# Patient Record
Sex: Female | Born: 1935
Health system: Southern US, Community
[De-identification: ages and names within clinical notes are randomized; demographics above are authoritative.]

## PROBLEM LIST (undated history)

## (undated) DIAGNOSIS — Z800 Family history of malignant neoplasm of digestive organs: Secondary | ICD-10-CM

## (undated) DIAGNOSIS — Z9889 Other specified postprocedural states: Secondary | ICD-10-CM

## (undated) DIAGNOSIS — R053 Chronic cough: Secondary | ICD-10-CM

## (undated) DIAGNOSIS — K222 Esophageal obstruction: Secondary | ICD-10-CM

## (undated) DIAGNOSIS — F32A Depression, unspecified: Secondary | ICD-10-CM

## (undated) DIAGNOSIS — R202 Paresthesia of skin: Secondary | ICD-10-CM

## (undated) DIAGNOSIS — R112 Nausea with vomiting, unspecified: Secondary | ICD-10-CM

## (undated) DIAGNOSIS — N390 Urinary tract infection, site not specified: Secondary | ICD-10-CM

## (undated) DIAGNOSIS — Z923 Personal history of irradiation: Secondary | ICD-10-CM

## (undated) DIAGNOSIS — Z8719 Personal history of other diseases of the digestive system: Secondary | ICD-10-CM

## (undated) DIAGNOSIS — H9190 Unspecified hearing loss, unspecified ear: Secondary | ICD-10-CM

## (undated) DIAGNOSIS — Z86010 Personal history of colonic polyps: Secondary | ICD-10-CM

## (undated) DIAGNOSIS — K219 Gastro-esophageal reflux disease without esophagitis: Secondary | ICD-10-CM

## (undated) DIAGNOSIS — R05 Cough: Secondary | ICD-10-CM

## (undated) DIAGNOSIS — M75100 Unspecified rotator cuff tear or rupture of unspecified shoulder, not specified as traumatic: Secondary | ICD-10-CM

## (undated) DIAGNOSIS — M8580 Other specified disorders of bone density and structure, unspecified site: Secondary | ICD-10-CM

## (undated) DIAGNOSIS — I499 Cardiac arrhythmia, unspecified: Secondary | ICD-10-CM

## (undated) DIAGNOSIS — R200 Anesthesia of skin: Secondary | ICD-10-CM

## (undated) DIAGNOSIS — K649 Unspecified hemorrhoids: Secondary | ICD-10-CM

## (undated) DIAGNOSIS — K429 Umbilical hernia without obstruction or gangrene: Secondary | ICD-10-CM

## (undated) DIAGNOSIS — F329 Major depressive disorder, single episode, unspecified: Secondary | ICD-10-CM

## (undated) DIAGNOSIS — E7800 Pure hypercholesterolemia, unspecified: Secondary | ICD-10-CM

## (undated) DIAGNOSIS — F419 Anxiety disorder, unspecified: Secondary | ICD-10-CM

## (undated) DIAGNOSIS — C50919 Malignant neoplasm of unspecified site of unspecified female breast: Secondary | ICD-10-CM

## (undated) DIAGNOSIS — M1990 Unspecified osteoarthritis, unspecified site: Secondary | ICD-10-CM

## (undated) DIAGNOSIS — I10 Essential (primary) hypertension: Secondary | ICD-10-CM

## (undated) HISTORY — DX: Gastro-esophageal reflux disease without esophagitis: K21.9

## (undated) HISTORY — PX: CATARACT EXTRACTION W/ INTRAOCULAR LENS IMPLANT: SHX1309

## (undated) HISTORY — DX: Umbilical hernia without obstruction or gangrene: K42.9

## (undated) HISTORY — PX: UMBILICAL HERNIA REPAIR: SHX196

## (undated) HISTORY — DX: Personal history of colonic polyps: Z86.010

## (undated) HISTORY — PX: DILATION AND CURETTAGE OF UTERUS: SHX78

## (undated) HISTORY — DX: Other specified postprocedural states: Z98.890

## (undated) HISTORY — DX: Depression, unspecified: F32.A

## (undated) HISTORY — DX: Urinary tract infection, site not specified: N39.0

## (undated) HISTORY — DX: Pure hypercholesterolemia, unspecified: E78.00

## (undated) HISTORY — PX: ESOPHAGOGASTRODUODENOSCOPY ENDOSCOPY: SHX5814

## (undated) HISTORY — DX: Major depressive disorder, single episode, unspecified: F32.9

## (undated) HISTORY — DX: Malignant neoplasm of unspecified site of unspecified female breast: C50.919

## (undated) HISTORY — DX: Personal history of other diseases of the digestive system: Z87.19

## (undated) HISTORY — PX: COLONOSCOPY: SHX174

## (undated) HISTORY — DX: Family history of malignant neoplasm of digestive organs: Z80.0

## (undated) HISTORY — DX: Anxiety disorder, unspecified: F41.9

## (undated) HISTORY — DX: Esophageal obstruction: K22.2

---

## 1998-03-14 ENCOUNTER — Emergency Department (HOSPITAL_COMMUNITY): Admission: EM | Admit: 1998-03-14 | Discharge: 1998-03-14 | Payer: Self-pay | Admitting: Emergency Medicine

## 1998-04-05 ENCOUNTER — Ambulatory Visit (HOSPITAL_COMMUNITY): Admission: RE | Admit: 1998-04-05 | Discharge: 1998-04-05 | Payer: Self-pay | Admitting: Gastroenterology

## 1998-10-01 ENCOUNTER — Other Ambulatory Visit: Admission: RE | Admit: 1998-10-01 | Discharge: 1998-10-01 | Payer: Self-pay | Admitting: Obstetrics & Gynecology

## 1998-11-19 ENCOUNTER — Encounter: Payer: Self-pay | Admitting: Gastroenterology

## 1998-11-19 ENCOUNTER — Ambulatory Visit (HOSPITAL_COMMUNITY): Admission: RE | Admit: 1998-11-19 | Discharge: 1998-11-19 | Payer: Self-pay | Admitting: Gastroenterology

## 1998-11-19 DIAGNOSIS — K222 Esophageal obstruction: Secondary | ICD-10-CM | POA: Insufficient documentation

## 1999-10-14 ENCOUNTER — Encounter: Admission: RE | Admit: 1999-10-14 | Discharge: 1999-10-14 | Payer: Self-pay | Admitting: Family Medicine

## 1999-10-14 ENCOUNTER — Encounter: Payer: Self-pay | Admitting: Family Medicine

## 2000-10-15 ENCOUNTER — Encounter: Admission: RE | Admit: 2000-10-15 | Discharge: 2000-10-15 | Payer: Self-pay | Admitting: Obstetrics & Gynecology

## 2000-10-15 ENCOUNTER — Encounter: Payer: Self-pay | Admitting: Obstetrics & Gynecology

## 2000-11-10 ENCOUNTER — Other Ambulatory Visit: Admission: RE | Admit: 2000-11-10 | Discharge: 2000-11-10 | Payer: Self-pay | Admitting: Obstetrics & Gynecology

## 2001-10-18 ENCOUNTER — Encounter: Admission: RE | Admit: 2001-10-18 | Discharge: 2001-10-18 | Payer: Self-pay | Admitting: Family Medicine

## 2001-10-18 ENCOUNTER — Encounter: Payer: Self-pay | Admitting: Obstetrics & Gynecology

## 2002-01-17 ENCOUNTER — Other Ambulatory Visit: Admission: RE | Admit: 2002-01-17 | Discharge: 2002-01-17 | Payer: Self-pay | Admitting: Obstetrics & Gynecology

## 2002-10-19 ENCOUNTER — Encounter: Payer: Self-pay | Admitting: Family Medicine

## 2002-10-19 ENCOUNTER — Encounter: Admission: RE | Admit: 2002-10-19 | Discharge: 2002-10-19 | Payer: Self-pay | Admitting: Family Medicine

## 2003-09-12 ENCOUNTER — Encounter: Admission: RE | Admit: 2003-09-12 | Discharge: 2003-09-12 | Payer: Self-pay | Admitting: Family Medicine

## 2003-10-04 ENCOUNTER — Encounter: Admission: RE | Admit: 2003-10-04 | Discharge: 2003-10-04 | Payer: Self-pay | Admitting: Obstetrics & Gynecology

## 2004-02-19 ENCOUNTER — Other Ambulatory Visit: Admission: RE | Admit: 2004-02-19 | Discharge: 2004-02-19 | Payer: Self-pay | Admitting: Obstetrics & Gynecology

## 2004-10-09 ENCOUNTER — Ambulatory Visit (HOSPITAL_COMMUNITY): Admission: RE | Admit: 2004-10-09 | Discharge: 2004-10-09 | Payer: Self-pay | Admitting: Family Medicine

## 2004-10-17 ENCOUNTER — Encounter (INDEPENDENT_AMBULATORY_CARE_PROVIDER_SITE_OTHER): Payer: Self-pay | Admitting: Diagnostic Radiology

## 2004-10-17 ENCOUNTER — Encounter: Admission: RE | Admit: 2004-10-17 | Discharge: 2004-10-17 | Payer: Self-pay | Admitting: Family Medicine

## 2004-10-17 ENCOUNTER — Encounter (INDEPENDENT_AMBULATORY_CARE_PROVIDER_SITE_OTHER): Payer: Self-pay | Admitting: *Deleted

## 2004-10-31 ENCOUNTER — Encounter: Admission: RE | Admit: 2004-10-31 | Discharge: 2004-10-31 | Payer: Self-pay | Admitting: General Surgery

## 2004-11-02 DIAGNOSIS — C50919 Malignant neoplasm of unspecified site of unspecified female breast: Secondary | ICD-10-CM

## 2004-11-02 HISTORY — DX: Malignant neoplasm of unspecified site of unspecified female breast: C50.919

## 2004-11-02 HISTORY — PX: BREAST LUMPECTOMY: SHX2

## 2004-11-06 ENCOUNTER — Encounter: Admission: RE | Admit: 2004-11-06 | Discharge: 2004-11-06 | Payer: Self-pay | Admitting: General Surgery

## 2004-11-10 ENCOUNTER — Encounter (INDEPENDENT_AMBULATORY_CARE_PROVIDER_SITE_OTHER): Payer: Self-pay | Admitting: Specialist

## 2004-11-10 ENCOUNTER — Encounter: Admission: RE | Admit: 2004-11-10 | Discharge: 2004-11-10 | Payer: Self-pay | Admitting: General Surgery

## 2004-11-10 ENCOUNTER — Ambulatory Visit (HOSPITAL_COMMUNITY): Admission: RE | Admit: 2004-11-10 | Discharge: 2004-11-10 | Payer: Self-pay | Admitting: General Surgery

## 2004-11-10 ENCOUNTER — Ambulatory Visit (HOSPITAL_BASED_OUTPATIENT_CLINIC_OR_DEPARTMENT_OTHER): Admission: RE | Admit: 2004-11-10 | Discharge: 2004-11-10 | Payer: Self-pay | Admitting: General Surgery

## 2004-11-14 ENCOUNTER — Ambulatory Visit: Payer: Self-pay | Admitting: Oncology

## 2004-11-24 ENCOUNTER — Ambulatory Visit: Admission: RE | Admit: 2004-11-24 | Discharge: 2005-01-28 | Payer: Self-pay | Admitting: *Deleted

## 2005-01-21 ENCOUNTER — Ambulatory Visit: Payer: Self-pay | Admitting: Oncology

## 2005-02-17 ENCOUNTER — Ambulatory Visit: Admission: RE | Admit: 2005-02-17 | Discharge: 2005-02-17 | Payer: Self-pay | Admitting: *Deleted

## 2005-03-05 ENCOUNTER — Ambulatory Visit: Payer: Self-pay | Admitting: Oncology

## 2005-05-01 ENCOUNTER — Ambulatory Visit: Payer: Self-pay | Admitting: Oncology

## 2005-08-24 ENCOUNTER — Ambulatory Visit: Payer: Self-pay | Admitting: Gastroenterology

## 2005-09-02 ENCOUNTER — Ambulatory Visit: Payer: Self-pay | Admitting: Oncology

## 2005-10-12 ENCOUNTER — Encounter: Admission: RE | Admit: 2005-10-12 | Discharge: 2005-10-12 | Payer: Self-pay | Admitting: Family Medicine

## 2006-03-16 ENCOUNTER — Ambulatory Visit: Payer: Self-pay | Admitting: Oncology

## 2006-03-18 LAB — COMPREHENSIVE METABOLIC PANEL
ALT: 15 U/L (ref 0–40)
AST: 24 U/L (ref 0–37)
Albumin: 4.1 g/dL (ref 3.5–5.2)
Alkaline Phosphatase: 79 U/L (ref 39–117)
BUN: 10 mg/dL (ref 6–23)
CO2: 27 mEq/L (ref 19–32)
Calcium: 9.1 mg/dL (ref 8.4–10.5)
Chloride: 104 mEq/L (ref 96–112)
Creatinine, Ser: 1 mg/dL (ref 0.4–1.2)
Glucose, Bld: 80 mg/dL (ref 70–99)
Potassium: 4 mEq/L (ref 3.5–5.3)
Sodium: 140 mEq/L (ref 135–145)
Total Bilirubin: 0.6 mg/dL (ref 0.3–1.2)
Total Protein: 6.3 g/dL (ref 6.0–8.3)

## 2006-03-18 LAB — CBC WITH DIFFERENTIAL (CANCER CENTER ONLY)
BASO#: 0 10*3/uL (ref 0.0–0.2)
BASO%: 0.6 % (ref 0.0–2.0)
EOS%: 2.3 % (ref 0.0–7.0)
Eosinophils Absolute: 0.1 10*3/uL (ref 0.0–0.5)
HCT: 43.4 % (ref 34.8–46.6)
HGB: 14.9 g/dL (ref 11.6–15.9)
LYMPH#: 1.3 10*3/uL (ref 0.9–3.3)
LYMPH%: 24.7 % (ref 14.0–48.0)
MCH: 30.6 pg (ref 26.0–34.0)
MCHC: 34.3 g/dL (ref 32.0–36.0)
MCV: 89 fL (ref 81–101)
MONO#: 0.5 10*3/uL (ref 0.1–0.9)
MONO%: 8.7 % (ref 0.0–13.0)
NEUT#: 3.4 10*3/uL (ref 1.5–6.5)
NEUT%: 63.7 % (ref 39.6–80.0)
Platelets: 272 10*3/uL (ref 145–400)
RBC: 4.85 10*6/uL (ref 3.70–5.32)
RDW: 12.4 % (ref 10.5–14.6)
WBC: 5.3 10*3/uL (ref 3.9–10.0)

## 2006-03-18 LAB — LACTATE DEHYDROGENASE: LDH: 155 U/L (ref 94–250)

## 2006-03-18 LAB — CANCER ANTIGEN 27.29: CA 27.29: 18 U/mL (ref 0–39)

## 2006-04-13 ENCOUNTER — Ambulatory Visit: Payer: Self-pay | Admitting: Gastroenterology

## 2006-05-17 ENCOUNTER — Ambulatory Visit: Payer: Self-pay | Admitting: Gastroenterology

## 2006-06-16 ENCOUNTER — Encounter: Admission: RE | Admit: 2006-06-16 | Discharge: 2006-06-16 | Payer: Self-pay | Admitting: General Surgery

## 2006-09-13 ENCOUNTER — Encounter: Admission: RE | Admit: 2006-09-13 | Discharge: 2006-09-13 | Payer: Self-pay | Admitting: Family Medicine

## 2006-09-16 ENCOUNTER — Ambulatory Visit: Payer: Self-pay | Admitting: Oncology

## 2006-09-20 LAB — CBC WITH DIFFERENTIAL (CANCER CENTER ONLY)
BASO#: 0 10*3/uL (ref 0.0–0.2)
BASO%: 0.4 % (ref 0.0–2.0)
EOS%: 2.5 % (ref 0.0–7.0)
Eosinophils Absolute: 0.2 10*3/uL (ref 0.0–0.5)
HCT: 42 % (ref 34.8–46.6)
HGB: 14.1 g/dL (ref 11.6–15.9)
LYMPH#: 1.3 10*3/uL (ref 0.9–3.3)
LYMPH%: 22.5 % (ref 14.0–48.0)
MCH: 30.1 pg (ref 26.0–34.0)
MCHC: 33.5 g/dL (ref 32.0–36.0)
MCV: 90 fL (ref 81–101)
MONO#: 0.4 10*3/uL (ref 0.1–0.9)
MONO%: 7.5 % (ref 0.0–13.0)
NEUT#: 3.9 10*3/uL (ref 1.5–6.5)
NEUT%: 67.1 % (ref 39.6–80.0)
Platelets: 315 10*3/uL (ref 145–400)
RBC: 4.68 10*6/uL (ref 3.70–5.32)
RDW: 11.6 % (ref 10.5–14.6)
WBC: 5.9 10*3/uL (ref 3.9–10.0)

## 2006-09-21 LAB — COMPREHENSIVE METABOLIC PANEL
ALT: 18 U/L (ref 0–35)
AST: 23 U/L (ref 0–37)
Albumin: 3.8 g/dL (ref 3.5–5.2)
Alkaline Phosphatase: 92 U/L (ref 39–117)
BUN: 11 mg/dL (ref 6–23)
CO2: 29 mEq/L (ref 19–32)
Calcium: 9.4 mg/dL (ref 8.4–10.5)
Chloride: 107 mEq/L (ref 96–112)
Creatinine, Ser: 0.7 mg/dL (ref 0.40–1.20)
Glucose, Bld: 81 mg/dL (ref 70–99)
Potassium: 4.1 mEq/L (ref 3.5–5.3)
Sodium: 143 mEq/L (ref 135–145)
Total Bilirubin: 0.5 mg/dL (ref 0.3–1.2)
Total Protein: 6.4 g/dL (ref 6.0–8.3)

## 2006-09-21 LAB — LACTATE DEHYDROGENASE: LDH: 151 U/L (ref 94–250)

## 2006-09-21 LAB — CANCER ANTIGEN 27.29: CA 27.29: 16 U/mL (ref 0–39)

## 2006-10-13 ENCOUNTER — Encounter: Admission: RE | Admit: 2006-10-13 | Discharge: 2006-10-13 | Payer: Self-pay | Admitting: Family Medicine

## 2007-01-26 ENCOUNTER — Ambulatory Visit: Payer: Self-pay | Admitting: Gastroenterology

## 2007-01-26 LAB — CONVERTED CEMR LAB
BUN: 11 mg/dL (ref 6–23)
Basophils Absolute: 0.1 10*3/uL (ref 0.0–0.1)
Basophils Relative: 0.8 % (ref 0.0–1.0)
Creatinine, Ser: 0.8 mg/dL (ref 0.4–1.2)
Eosinophils Absolute: 0.1 10*3/uL (ref 0.0–0.6)
Eosinophils Relative: 1.6 % (ref 0.0–5.0)
HCT: 42.2 % (ref 36.0–46.0)
Hemoglobin: 14.6 g/dL (ref 12.0–15.0)
Lymphocytes Relative: 23.2 % (ref 12.0–46.0)
MCHC: 34.6 g/dL (ref 30.0–36.0)
MCV: 88.8 fL (ref 78.0–100.0)
Monocytes Absolute: 0.6 10*3/uL (ref 0.2–0.7)
Monocytes Relative: 8.7 % (ref 3.0–11.0)
Neutro Abs: 4.2 10*3/uL (ref 1.4–7.7)
Neutrophils Relative %: 65.7 % (ref 43.0–77.0)
Platelets: 290 10*3/uL (ref 150–400)
RBC: 4.75 M/uL (ref 3.87–5.11)
RDW: 13.3 % (ref 11.5–14.6)
WBC: 6.5 10*3/uL (ref 4.5–10.5)

## 2007-01-28 ENCOUNTER — Ambulatory Visit: Payer: Self-pay | Admitting: Cardiology

## 2007-02-03 ENCOUNTER — Ambulatory Visit: Payer: Self-pay | Admitting: Gastroenterology

## 2007-02-09 ENCOUNTER — Encounter (INDEPENDENT_AMBULATORY_CARE_PROVIDER_SITE_OTHER): Payer: Self-pay | Admitting: Specialist

## 2007-02-09 ENCOUNTER — Ambulatory Visit: Payer: Self-pay | Admitting: Gastroenterology

## 2007-02-09 DIAGNOSIS — K5732 Diverticulitis of large intestine without perforation or abscess without bleeding: Secondary | ICD-10-CM | POA: Insufficient documentation

## 2007-02-09 DIAGNOSIS — D126 Benign neoplasm of colon, unspecified: Secondary | ICD-10-CM | POA: Insufficient documentation

## 2007-02-09 DIAGNOSIS — Z860100 Personal history of colon polyps, unspecified: Secondary | ICD-10-CM

## 2007-02-09 HISTORY — DX: Personal history of colonic polyps: Z86.010

## 2007-02-09 HISTORY — DX: Personal history of colon polyps, unspecified: Z86.0100

## 2007-03-21 ENCOUNTER — Ambulatory Visit: Payer: Self-pay | Admitting: Oncology

## 2007-03-22 LAB — CBC WITH DIFFERENTIAL (CANCER CENTER ONLY)
BASO#: 0 10*3/uL (ref 0.0–0.2)
BASO%: 0.5 % (ref 0.0–2.0)
EOS%: 2.3 % (ref 0.0–7.0)
Eosinophils Absolute: 0.1 10*3/uL (ref 0.0–0.5)
HCT: 41.5 % (ref 34.8–46.6)
HGB: 14.2 g/dL (ref 11.6–15.9)
LYMPH#: 1.3 10*3/uL (ref 0.9–3.3)
LYMPH%: 24.3 % (ref 14.0–48.0)
MCH: 30.3 pg (ref 26.0–34.0)
MCHC: 34.2 g/dL (ref 32.0–36.0)
MCV: 89 fL (ref 81–101)
MONO#: 0.4 10*3/uL (ref 0.1–0.9)
MONO%: 7.8 % (ref 0.0–13.0)
NEUT#: 3.6 10*3/uL (ref 1.5–6.5)
NEUT%: 65.1 % (ref 39.6–80.0)
Platelets: 296 10*3/uL (ref 145–400)
RBC: 4.68 10*6/uL (ref 3.70–5.32)
RDW: 12.1 % (ref 10.5–14.6)
WBC: 5.5 10*3/uL (ref 3.9–10.0)

## 2007-03-23 LAB — COMPREHENSIVE METABOLIC PANEL
ALT: 16 U/L (ref 0–35)
AST: 22 U/L (ref 0–37)
Albumin: 3.8 g/dL (ref 3.5–5.2)
Alkaline Phosphatase: 85 U/L (ref 39–117)
BUN: 12 mg/dL (ref 6–23)
CO2: 28 mEq/L (ref 19–32)
Calcium: 9.4 mg/dL (ref 8.4–10.5)
Chloride: 106 mEq/L (ref 96–112)
Creatinine, Ser: 0.89 mg/dL (ref 0.40–1.20)
Glucose, Bld: 84 mg/dL (ref 70–99)
Potassium: 4.4 mEq/L (ref 3.5–5.3)
Sodium: 142 mEq/L (ref 135–145)
Total Bilirubin: 0.4 mg/dL (ref 0.3–1.2)
Total Protein: 6.2 g/dL (ref 6.0–8.3)

## 2007-03-23 LAB — CANCER ANTIGEN 27.29: CA 27.29: 22 U/mL (ref 0–39)

## 2007-05-11 ENCOUNTER — Ambulatory Visit: Payer: Self-pay | Admitting: Gastroenterology

## 2007-09-19 ENCOUNTER — Ambulatory Visit: Payer: Self-pay | Admitting: Oncology

## 2007-09-20 LAB — COMPREHENSIVE METABOLIC PANEL
ALT: 16 U/L (ref 0–35)
AST: 22 U/L (ref 0–37)
Albumin: 4.1 g/dL (ref 3.5–5.2)
Alkaline Phosphatase: 94 U/L (ref 39–117)
BUN: 10 mg/dL (ref 6–23)
CO2: 28 mEq/L (ref 19–32)
Calcium: 9.3 mg/dL (ref 8.4–10.5)
Chloride: 104 mEq/L (ref 96–112)
Creatinine, Ser: 0.92 mg/dL (ref 0.40–1.20)
Glucose, Bld: 87 mg/dL (ref 70–99)
Potassium: 4.1 mEq/L (ref 3.5–5.3)
Sodium: 140 mEq/L (ref 135–145)
Total Bilirubin: 0.7 mg/dL (ref 0.3–1.2)
Total Protein: 6.3 g/dL (ref 6.0–8.3)

## 2007-09-20 LAB — CBC WITH DIFFERENTIAL (CANCER CENTER ONLY)
BASO#: 0 10*3/uL (ref 0.0–0.2)
BASO%: 0.3 % (ref 0.0–2.0)
EOS%: 2.5 % (ref 0.0–7.0)
Eosinophils Absolute: 0.1 10*3/uL (ref 0.0–0.5)
HCT: 42.1 % (ref 34.8–46.6)
HGB: 14.5 g/dL (ref 11.6–15.9)
LYMPH#: 1.3 10*3/uL (ref 0.9–3.3)
LYMPH%: 25.5 % (ref 14.0–48.0)
MCH: 30.3 pg (ref 26.0–34.0)
MCHC: 34.4 g/dL (ref 32.0–36.0)
MCV: 88 fL (ref 81–101)
MONO#: 0.4 10*3/uL (ref 0.1–0.9)
MONO%: 7.8 % (ref 0.0–13.0)
NEUT#: 3.4 10*3/uL (ref 1.5–6.5)
NEUT%: 63.9 % (ref 39.6–80.0)
Platelets: 282 10*3/uL (ref 145–400)
RBC: 4.78 10*6/uL (ref 3.70–5.32)
RDW: 12.6 % (ref 10.5–14.6)
WBC: 5.3 10*3/uL (ref 3.9–10.0)

## 2007-09-20 LAB — CANCER ANTIGEN 27.29: CA 27.29: 21 U/mL (ref 0–39)

## 2007-10-17 ENCOUNTER — Encounter: Admission: RE | Admit: 2007-10-17 | Discharge: 2007-10-17 | Payer: Self-pay | Admitting: Family Medicine

## 2007-11-10 ENCOUNTER — Encounter: Admission: RE | Admit: 2007-11-10 | Discharge: 2007-11-10 | Payer: Self-pay | Admitting: General Surgery

## 2007-11-14 ENCOUNTER — Ambulatory Visit (HOSPITAL_BASED_OUTPATIENT_CLINIC_OR_DEPARTMENT_OTHER): Admission: RE | Admit: 2007-11-14 | Discharge: 2007-11-14 | Payer: Self-pay | Admitting: General Surgery

## 2008-02-18 DIAGNOSIS — K219 Gastro-esophageal reflux disease without esophagitis: Secondary | ICD-10-CM | POA: Insufficient documentation

## 2008-02-18 DIAGNOSIS — E785 Hyperlipidemia, unspecified: Secondary | ICD-10-CM | POA: Insufficient documentation

## 2008-02-18 DIAGNOSIS — K449 Diaphragmatic hernia without obstruction or gangrene: Secondary | ICD-10-CM | POA: Insufficient documentation

## 2008-02-18 DIAGNOSIS — M1990 Unspecified osteoarthritis, unspecified site: Secondary | ICD-10-CM | POA: Insufficient documentation

## 2008-02-22 ENCOUNTER — Encounter: Payer: Self-pay | Admitting: Gastroenterology

## 2008-03-15 ENCOUNTER — Ambulatory Visit: Payer: Self-pay | Admitting: Oncology

## 2008-09-18 ENCOUNTER — Encounter: Admission: RE | Admit: 2008-09-18 | Discharge: 2008-09-18 | Payer: Self-pay | Admitting: Family Medicine

## 2008-10-17 ENCOUNTER — Encounter: Admission: RE | Admit: 2008-10-17 | Discharge: 2008-10-17 | Payer: Self-pay | Admitting: Family Medicine

## 2008-10-29 ENCOUNTER — Ambulatory Visit: Payer: Self-pay | Admitting: Oncology

## 2008-10-30 LAB — CBC WITH DIFFERENTIAL (CANCER CENTER ONLY)
BASO#: 0 10*3/uL (ref 0.0–0.2)
BASO%: 0.7 % (ref 0.0–2.0)
EOS%: 2 % (ref 0.0–7.0)
Eosinophils Absolute: 0.1 10*3/uL (ref 0.0–0.5)
HCT: 42.5 % (ref 34.8–46.6)
HGB: 14.2 g/dL (ref 11.6–15.9)
LYMPH#: 1.2 10*3/uL (ref 0.9–3.3)
LYMPH%: 21.3 % (ref 14.0–48.0)
MCH: 30.1 pg (ref 26.0–34.0)
MCHC: 33.5 g/dL (ref 32.0–36.0)
MCV: 90 fL (ref 81–101)
MONO#: 0.4 10*3/uL (ref 0.1–0.9)
MONO%: 7.8 % (ref 0.0–13.0)
NEUT#: 3.8 10*3/uL (ref 1.5–6.5)
NEUT%: 68.2 % (ref 39.6–80.0)
Platelets: 245 10*3/uL (ref 145–400)
RBC: 4.73 10*6/uL (ref 3.70–5.32)
RDW: 12 % (ref 10.5–14.6)
WBC: 5.6 10*3/uL (ref 3.9–10.0)

## 2008-10-30 LAB — CMP (CANCER CENTER ONLY)
ALT(SGPT): 17 U/L (ref 10–47)
AST: 29 U/L (ref 11–38)
Albumin: 3.8 g/dL (ref 3.3–5.5)
Alkaline Phosphatase: 86 U/L — ABNORMAL HIGH (ref 26–84)
BUN, Bld: 14 mg/dL (ref 7–22)
CO2: 31 mEq/L (ref 18–33)
Calcium: 9.2 mg/dL (ref 8.0–10.3)
Chloride: 103 mEq/L (ref 98–108)
Creat: 1.2 mg/dl (ref 0.6–1.2)
Glucose, Bld: 87 mg/dL (ref 73–118)
Potassium: 4.2 mEq/L (ref 3.3–4.7)
Sodium: 139 mEq/L (ref 128–145)
Total Bilirubin: 0.4 mg/dl (ref 0.20–1.60)
Total Protein: 7.2 g/dL (ref 6.4–8.1)

## 2008-10-30 LAB — CANCER ANTIGEN 27.29: CA 27.29: 15 U/mL (ref 0–39)

## 2009-05-27 ENCOUNTER — Ambulatory Visit: Payer: Self-pay | Admitting: Oncology

## 2009-05-29 LAB — CBC WITH DIFFERENTIAL (CANCER CENTER ONLY)
BASO#: 0 10*3/uL (ref 0.0–0.2)
BASO%: 0.6 % (ref 0.0–2.0)
EOS%: 2 % (ref 0.0–7.0)
Eosinophils Absolute: 0.1 10*3/uL (ref 0.0–0.5)
HCT: 40 % (ref 34.8–46.6)
HGB: 13.8 g/dL (ref 11.6–15.9)
LYMPH#: 1.3 10*3/uL (ref 0.9–3.3)
LYMPH%: 23.4 % (ref 14.0–48.0)
MCH: 30.3 pg (ref 26.0–34.0)
MCHC: 34.6 g/dL (ref 32.0–36.0)
MCV: 87 fL (ref 81–101)
MONO#: 0.5 10*3/uL (ref 0.1–0.9)
MONO%: 8.4 % (ref 0.0–13.0)
NEUT#: 3.6 10*3/uL (ref 1.5–6.5)
NEUT%: 65.6 % (ref 39.6–80.0)
Platelets: 244 10*3/uL (ref 145–400)
RBC: 4.57 10*6/uL (ref 3.70–5.32)
RDW: 13.7 % (ref 10.5–14.6)
WBC: 5.5 10*3/uL (ref 3.9–10.0)

## 2009-05-29 LAB — CMP (CANCER CENTER ONLY)
ALT(SGPT): 18 U/L (ref 10–47)
AST: 28 U/L (ref 11–38)
Albumin: 3.2 g/dL — ABNORMAL LOW (ref 3.3–5.5)
Alkaline Phosphatase: 81 U/L (ref 26–84)
BUN, Bld: 16 mg/dL (ref 7–22)
CO2: 31 mEq/L (ref 18–33)
Calcium: 8.8 mg/dL (ref 8.0–10.3)
Chloride: 102 mEq/L (ref 98–108)
Creat: 0.9 mg/dl (ref 0.6–1.2)
Glucose, Bld: 82 mg/dL (ref 73–118)
Potassium: 4.3 mEq/L (ref 3.3–4.7)
Sodium: 141 mEq/L (ref 128–145)
Total Bilirubin: 0.5 mg/dl (ref 0.20–1.60)
Total Protein: 6.7 g/dL (ref 6.4–8.1)

## 2009-10-22 ENCOUNTER — Encounter: Admission: RE | Admit: 2009-10-22 | Discharge: 2009-10-22 | Payer: Self-pay | Admitting: Family Medicine

## 2009-10-24 ENCOUNTER — Ambulatory Visit: Payer: Self-pay | Admitting: Oncology

## 2009-10-29 LAB — CBC WITH DIFFERENTIAL (CANCER CENTER ONLY)
BASO#: 0 10*3/uL (ref 0.0–0.2)
BASO%: 0.5 % (ref 0.0–2.0)
EOS%: 4.6 % (ref 0.0–7.0)
Eosinophils Absolute: 0.2 10*3/uL (ref 0.0–0.5)
HCT: 41.9 % (ref 34.8–46.6)
HGB: 14.1 g/dL (ref 11.6–15.9)
LYMPH#: 0.6 10*3/uL — ABNORMAL LOW (ref 0.9–3.3)
LYMPH%: 12.1 % — ABNORMAL LOW (ref 14.0–48.0)
MCH: 31 pg (ref 26.0–34.0)
MCHC: 33.6 g/dL (ref 32.0–36.0)
MCV: 92 fL (ref 81–101)
MONO#: 0.6 10*3/uL (ref 0.1–0.9)
MONO%: 11.1 % (ref 0.0–13.0)
NEUT#: 3.8 10*3/uL (ref 1.5–6.5)
NEUT%: 71.7 % (ref 39.6–80.0)
Platelets: 237 10*3/uL (ref 145–400)
RBC: 4.54 10*6/uL (ref 3.70–5.32)
RDW: 11.9 % (ref 10.5–14.6)
WBC: 5.2 10*3/uL (ref 3.9–10.0)

## 2009-10-29 LAB — CMP (CANCER CENTER ONLY)
ALT(SGPT): 21 U/L (ref 10–47)
AST: 29 U/L (ref 11–38)
Albumin: 3.3 g/dL (ref 3.3–5.5)
Alkaline Phosphatase: 102 U/L — ABNORMAL HIGH (ref 26–84)
BUN, Bld: 15 mg/dL (ref 7–22)
CO2: 28 mEq/L (ref 18–33)
Calcium: 9.3 mg/dL (ref 8.0–10.3)
Chloride: 108 mEq/L (ref 98–108)
Creat: 1.1 mg/dl (ref 0.6–1.2)
Glucose, Bld: 103 mg/dL (ref 73–118)
Potassium: 3.9 mEq/L (ref 3.3–4.7)
Sodium: 142 mEq/L (ref 128–145)
Total Bilirubin: 0.5 mg/dl (ref 0.20–1.60)
Total Protein: 7 g/dL (ref 6.4–8.1)

## 2010-02-28 ENCOUNTER — Ambulatory Visit: Payer: Self-pay | Admitting: Oncology

## 2010-03-04 LAB — CBC WITH DIFFERENTIAL/PLATELET
BASO%: 0.4 % (ref 0.0–2.0)
Basophils Absolute: 0 10*3/uL (ref 0.0–0.1)
EOS%: 1.7 % (ref 0.0–7.0)
Eosinophils Absolute: 0.1 10*3/uL (ref 0.0–0.5)
HCT: 42.7 % (ref 34.8–46.6)
HGB: 14.6 g/dL (ref 11.6–15.9)
LYMPH%: 22.7 % (ref 14.0–49.7)
MCH: 32.1 pg (ref 25.1–34.0)
MCHC: 34.2 g/dL (ref 31.5–36.0)
MCV: 93.9 fL (ref 79.5–101.0)
MONO#: 0.4 10*3/uL (ref 0.1–0.9)
MONO%: 7 % (ref 0.0–14.0)
NEUT#: 3.9 10*3/uL (ref 1.5–6.5)
NEUT%: 68.2 % (ref 38.4–76.8)
Platelets: 213 10*3/uL (ref 145–400)
RBC: 4.55 10*6/uL (ref 3.70–5.45)
RDW: 12.9 % (ref 11.2–14.5)
WBC: 5.7 10*3/uL (ref 3.9–10.3)
lymph#: 1.3 10*3/uL (ref 0.9–3.3)

## 2010-03-04 LAB — CMP (CANCER CENTER ONLY)
ALT(SGPT): 26 U/L (ref 10–47)
AST: 33 U/L (ref 11–38)
Albumin: 3.9 g/dL (ref 3.3–5.5)
Alkaline Phosphatase: 90 U/L — ABNORMAL HIGH (ref 26–84)
BUN, Bld: 18 mg/dL (ref 7–22)
CO2: 29 mEq/L (ref 18–33)
Calcium: 9.4 mg/dL (ref 8.0–10.3)
Chloride: 109 mEq/L — ABNORMAL HIGH (ref 98–108)
Creat: 0.8 mg/dl (ref 0.6–1.2)
Glucose, Bld: 99 mg/dL (ref 73–118)
Potassium: 4.1 mEq/L (ref 3.3–4.7)
Sodium: 142 mEq/L (ref 128–145)
Total Bilirubin: 0.5 mg/dl (ref 0.20–1.60)
Total Protein: 7 g/dL (ref 6.4–8.1)

## 2010-08-26 ENCOUNTER — Ambulatory Visit: Payer: Self-pay | Admitting: Oncology

## 2010-09-01 ENCOUNTER — Ambulatory Visit: Payer: Self-pay | Admitting: Oncology

## 2010-09-02 LAB — CBC WITH DIFFERENTIAL/PLATELET
BASO%: 0.1 % (ref 0.0–2.0)
Basophils Absolute: 0 10*3/uL (ref 0.0–0.1)
EOS%: 2.4 % (ref 0.0–7.0)
Eosinophils Absolute: 0.1 10*3/uL (ref 0.0–0.5)
HCT: 39.2 % (ref 34.8–46.6)
HGB: 13.5 g/dL (ref 11.6–15.9)
LYMPH%: 23.5 % (ref 14.0–49.7)
MCH: 31.8 pg (ref 25.1–34.0)
MCHC: 34.4 g/dL (ref 31.5–36.0)
MCV: 92.7 fL (ref 79.5–101.0)
MONO#: 0.4 10*3/uL (ref 0.1–0.9)
MONO%: 7.6 % (ref 0.0–14.0)
NEUT#: 3.2 10*3/uL (ref 1.5–6.5)
NEUT%: 66.4 % (ref 38.4–76.8)
Platelets: 193 10*3/uL (ref 145–400)
RBC: 4.23 10*6/uL (ref 3.70–5.45)
RDW: 13.1 % (ref 11.2–14.5)
WBC: 4.9 10*3/uL (ref 3.9–10.3)
lymph#: 1.1 10*3/uL (ref 0.9–3.3)

## 2010-09-02 LAB — COMPREHENSIVE METABOLIC PANEL
ALT: 15 U/L (ref 0–35)
AST: 21 U/L (ref 0–37)
Albumin: 4 g/dL (ref 3.5–5.2)
Alkaline Phosphatase: 82 U/L (ref 39–117)
BUN: 23 mg/dL (ref 6–23)
CO2: 23 mEq/L (ref 19–32)
Calcium: 8.9 mg/dL (ref 8.4–10.5)
Chloride: 109 mEq/L (ref 96–112)
Creatinine, Ser: 0.95 mg/dL (ref 0.40–1.20)
Glucose, Bld: 95 mg/dL (ref 70–99)
Potassium: 4.2 mEq/L (ref 3.5–5.3)
Sodium: 142 mEq/L (ref 135–145)
Total Bilirubin: 0.4 mg/dL (ref 0.3–1.2)
Total Protein: 6.2 g/dL (ref 6.0–8.3)

## 2010-10-23 ENCOUNTER — Encounter
Admission: RE | Admit: 2010-10-23 | Discharge: 2010-10-23 | Payer: Self-pay | Source: Home / Self Care | Attending: Oncology | Admitting: Oncology

## 2010-12-04 NOTE — Procedures (Signed)
Summary: Gastroenterology egd  Gastroenterology egd   Imported By: Lowry Ram CMA 02/22/2008 14:30:31  _____________________________________________________________________  External Attachment:    Type:   Image     Comment:   External Document

## 2010-12-04 NOTE — Procedures (Signed)
Summary: Gastroenterology colon  Gastroenterology colon   Imported By: Lowry Ram CMA 02/22/2008 14:56:05  _____________________________________________________________________  External Attachment:    Type:   Image     Comment:   External Document

## 2011-02-02 ENCOUNTER — Ambulatory Visit: Payer: PRIVATE HEALTH INSURANCE | Attending: Neurology | Admitting: Physical Therapy

## 2011-02-02 ENCOUNTER — Ambulatory Visit: Payer: PRIVATE HEALTH INSURANCE | Admitting: Rehabilitative and Restorative Service Providers"

## 2011-02-02 DIAGNOSIS — R269 Unspecified abnormalities of gait and mobility: Secondary | ICD-10-CM | POA: Insufficient documentation

## 2011-02-02 DIAGNOSIS — IMO0001 Reserved for inherently not codable concepts without codable children: Secondary | ICD-10-CM | POA: Insufficient documentation

## 2011-02-02 DIAGNOSIS — H8110 Benign paroxysmal vertigo, unspecified ear: Secondary | ICD-10-CM | POA: Insufficient documentation

## 2011-02-06 ENCOUNTER — Other Ambulatory Visit: Payer: Self-pay | Admitting: Oncology

## 2011-02-06 ENCOUNTER — Encounter (HOSPITAL_BASED_OUTPATIENT_CLINIC_OR_DEPARTMENT_OTHER): Payer: PRIVATE HEALTH INSURANCE | Admitting: Oncology

## 2011-02-06 DIAGNOSIS — F3289 Other specified depressive episodes: Secondary | ICD-10-CM

## 2011-02-06 DIAGNOSIS — C50419 Malignant neoplasm of upper-outer quadrant of unspecified female breast: Secondary | ICD-10-CM

## 2011-02-06 DIAGNOSIS — F329 Major depressive disorder, single episode, unspecified: Secondary | ICD-10-CM

## 2011-02-06 DIAGNOSIS — K219 Gastro-esophageal reflux disease without esophagitis: Secondary | ICD-10-CM

## 2011-02-06 DIAGNOSIS — M810 Age-related osteoporosis without current pathological fracture: Secondary | ICD-10-CM

## 2011-02-06 LAB — CBC WITH DIFFERENTIAL/PLATELET
BASO%: 0.3 % (ref 0.0–2.0)
Basophils Absolute: 0 10*3/uL (ref 0.0–0.1)
EOS%: 2.9 % (ref 0.0–7.0)
Eosinophils Absolute: 0.2 10*3/uL (ref 0.0–0.5)
HCT: 41 % (ref 34.8–46.6)
HGB: 14 g/dL (ref 11.6–15.9)
LYMPH%: 23 % (ref 14.0–49.7)
MCH: 31.7 pg (ref 25.1–34.0)
MCHC: 34.1 g/dL (ref 31.5–36.0)
MCV: 93.1 fL (ref 79.5–101.0)
MONO#: 0.4 10*3/uL (ref 0.1–0.9)
MONO%: 7.8 % (ref 0.0–14.0)
NEUT#: 3.6 10*3/uL (ref 1.5–6.5)
NEUT%: 66 % (ref 38.4–76.8)
Platelets: 207 10*3/uL (ref 145–400)
RBC: 4.4 10*6/uL (ref 3.70–5.45)
RDW: 12.8 % (ref 11.2–14.5)
WBC: 5.5 10*3/uL (ref 3.9–10.3)
lymph#: 1.3 10*3/uL (ref 0.9–3.3)

## 2011-02-06 LAB — COMPREHENSIVE METABOLIC PANEL
ALT: 13 U/L (ref 0–35)
AST: 24 U/L (ref 0–37)
Albumin: 4.3 g/dL (ref 3.5–5.2)
Alkaline Phosphatase: 87 U/L (ref 39–117)
BUN: 16 mg/dL (ref 6–23)
CO2: 26 mEq/L (ref 19–32)
Calcium: 9.4 mg/dL (ref 8.4–10.5)
Chloride: 106 mEq/L (ref 96–112)
Creatinine, Ser: 0.84 mg/dL (ref 0.40–1.20)
Glucose, Bld: 87 mg/dL (ref 70–99)
Potassium: 4 mEq/L (ref 3.5–5.3)
Sodium: 142 mEq/L (ref 135–145)
Total Bilirubin: 0.4 mg/dL (ref 0.3–1.2)
Total Protein: 6.5 g/dL (ref 6.0–8.3)

## 2011-02-18 ENCOUNTER — Ambulatory Visit: Payer: PRIVATE HEALTH INSURANCE | Admitting: Rehabilitative and Restorative Service Providers"

## 2011-03-04 ENCOUNTER — Ambulatory Visit: Payer: PRIVATE HEALTH INSURANCE | Attending: Neurology | Admitting: Rehabilitative and Restorative Service Providers"

## 2011-03-04 DIAGNOSIS — H8110 Benign paroxysmal vertigo, unspecified ear: Secondary | ICD-10-CM | POA: Insufficient documentation

## 2011-03-04 DIAGNOSIS — IMO0001 Reserved for inherently not codable concepts without codable children: Secondary | ICD-10-CM | POA: Insufficient documentation

## 2011-03-04 DIAGNOSIS — R269 Unspecified abnormalities of gait and mobility: Secondary | ICD-10-CM | POA: Insufficient documentation

## 2011-03-13 ENCOUNTER — Ambulatory Visit: Payer: PRIVATE HEALTH INSURANCE | Admitting: Rehabilitative and Restorative Service Providers"

## 2011-03-17 ENCOUNTER — Ambulatory Visit: Payer: PRIVATE HEALTH INSURANCE | Admitting: Rehabilitative and Restorative Service Providers"

## 2011-03-17 NOTE — Op Note (Signed)
[name], [name]                    ACCOUNT NO.:  [id number]   MEDICAL RECORD NO.:  [id number]          PATIENT TYPE:  AMB   LOCATION:  DSC                          FACILITY:  [facility]   PHYSICIAN:  Leonie Man, M.D.   DATE OF BIRTH:  14-Jul-1931   DATE OF PROCEDURE:  11/14/2007  DATE OF DISCHARGE:                               OPERATIVE REPORT   PREOPERATIVE DIAGNOSIS:  Incarcerated umbilical hernia.   POSTOPERATIVE DIAGNOSIS:  Incarcerated umbilical hernia.   PROCEDURE:  Repair of incarcerated umbilical hernia with mesh.   SURGEON:  Leonie Man, MD.   ASSISTANT:  OR tech.   ANESTHESIA:  General.   ESTIMATED BLOOD LOSS:  Minimal.   COMPLICATIONS:  None.  The patient returned to the PACU in excellent  condition.   [name] is a 77 year old lady with a large and increasingly  symptomatic incarcerated umbilical hernia.  She notes that she continues  to have pain on an intermittent, nonpredictable, basis from this and  comes to the operating room now for repair.  The patient understands the  risks and potential benefits of surgery and gives her consent to same.   PROCEDURE:  Following the induction of satisfactory general anesthesia,  the patient is positioned supinely and the abdomen is prepped and draped  including a sterile operative field.  Positive identification carried  out identifying the patient as Natalie West and the procedure to be  umbilical hernia repair.   A curvilinear incision is made in the infraumbilical region, deepened  through she skin and subcutaneous tissues.  The very large hernia sac is  dissected free and carried down to the fascia.  The sac is opened and  scored and the sac is removed.  The incarcerated omentum is reduced into  the peritoneal cavity.  Adhesions to the anterior abdominal wall are  taken down and a large 8 cm diameter Ventralex patch is placed into the  defect and the ties brought up holding the patch close against the  anterior  abdominal wall.  This was sewn in with multiple sutures of #0  Novofil.  The edge of the repair were then brought together with  interrupted #0 Novofil sutures, thus closing the fascia over the mesh.  The umbilicus was then tacked down to the base of the wound and the  subcutaneous tissues closed with interrupted #3-0 Vicryl sutures after  the sponge and instrument counts have been verified.  The skin is closed  with  Monocryl suture and reinforced with Steri-Strips.  A sterile compressive  dressing is applied the anesthetic reversed and the patient removed from  the operating room to the recovery room in stable condition.  She  tolerated the procedure well.      Leonie Man, M.D.  Electronically Signed     PB/MEDQ  D:  11/14/2007  T:  11/14/2007  Job:  [id number]   cc:   [facility]  Gloriajean Dell. Andrey Campanile, M.D.

## 2011-03-17 NOTE — Assessment & Plan Note (Signed)
[facility]                         GASTROENTEROLOGY OFFICE NOTE   NAME:LEEJaloni, Natalie West                           MRN:          [id number]  DATE:05/11/2007                            DOB:          July 05, 1931    [name] has returned for scheduled GI followup.  Colonoscopy in April  2008 demonstrated resolving changes consistent with resolving  diverticulitis.  A small adenomatous polyp was removed.  Currently, [name] has no GI complaints including pain, pyrosis, or dysphagia.  She has  a history of esophageal stricture, which as been dilated in the past.   EXAMINATION:  Pulse 56.  Blood pressure 118/78.  Weight 152.   RECOMMENDATIONS:  1. Repeat dilatation p.r.n..  2. Followup colonoscopy in approximately 5 years.     Barbette Hair. Arlyce Dice, MD,FACG  Electronically Signed    RDK/MedQ  DD: 05/11/2007  DT: 05/11/2007  Job #: [id number]   cc:   Teena Irani. Arlyce Dice, M.D.

## 2011-03-19 ENCOUNTER — Ambulatory Visit: Payer: PRIVATE HEALTH INSURANCE | Admitting: Rehabilitative and Restorative Service Providers"

## 2011-03-24 ENCOUNTER — Ambulatory Visit: Payer: PRIVATE HEALTH INSURANCE | Admitting: Rehabilitative and Restorative Service Providers"

## 2011-03-26 ENCOUNTER — Ambulatory Visit: Payer: PRIVATE HEALTH INSURANCE | Admitting: Rehabilitative and Restorative Service Providers"

## 2011-06-25 ENCOUNTER — Telehealth: Payer: Self-pay | Admitting: Gastroenterology

## 2011-06-25 NOTE — Telephone Encounter (Signed)
Pt states that she has been having problems with diarrhea and abdominal pain. Pt states that last night there was a lot of blood in the toilet bowl when she went to the bathroom. Pt scheduled to see Willette Cluster NP 06/26/11@2pm . Pt aware of appt date and time. Pt instructed to go to the ER if she saw large amounts of maroon dark colored blood. Pt verbalized understanding.

## 2011-06-26 ENCOUNTER — Ambulatory Visit (INDEPENDENT_AMBULATORY_CARE_PROVIDER_SITE_OTHER): Payer: PRIVATE HEALTH INSURANCE | Admitting: Nurse Practitioner

## 2011-06-26 ENCOUNTER — Encounter (INDEPENDENT_AMBULATORY_CARE_PROVIDER_SITE_OTHER): Payer: PRIVATE HEALTH INSURANCE | Admitting: Nurse Practitioner

## 2011-06-26 ENCOUNTER — Encounter: Payer: Self-pay | Admitting: Nurse Practitioner

## 2011-06-26 VITALS — BP 110/66 | HR 66 | Ht 62.0 in | Wt 156.2 lb

## 2011-06-26 DIAGNOSIS — K529 Noninfective gastroenteritis and colitis, unspecified: Secondary | ICD-10-CM

## 2011-06-26 DIAGNOSIS — K5289 Other specified noninfective gastroenteritis and colitis: Secondary | ICD-10-CM

## 2011-06-26 DIAGNOSIS — K648 Other hemorrhoids: Secondary | ICD-10-CM

## 2011-06-26 DIAGNOSIS — D126 Benign neoplasm of colon, unspecified: Secondary | ICD-10-CM

## 2011-06-26 NOTE — Patient Instructions (Signed)
If you experience any : Nausea Vomiting Diarrhea Please call us at [phone]. Dr Marzetta Board nurse is Bonita Quin.

## 2011-06-28 DIAGNOSIS — K648 Other hemorrhoids: Secondary | ICD-10-CM | POA: Insufficient documentation

## 2011-06-28 DIAGNOSIS — K529 Noninfective gastroenteritis and colitis, unspecified: Secondary | ICD-10-CM | POA: Insufficient documentation

## 2011-06-28 NOTE — Assessment & Plan Note (Addendum)
One episode of rectal bleeding following several episodes of diarrhea. No rectal bleeding prior to, or since that one episode. Suspect bleeding was secondary to internal hemorrhoids seen on exam today (and on colonoscopy in 2008). Patient is not usually bothered by hemorrhoids and since she hasn't had any further bleeding will hold off on treatment for now. She will call us for any episodes of recurrent bleeding.

## 2011-06-28 NOTE — Assessment & Plan Note (Addendum)
Surveillance colonoscopy due April 2013

## 2011-06-28 NOTE — Progress Notes (Signed)
06/28/2011 Natalie West [id number] Oct 19, 1931   HISTORY OF PRESENT ILLNESS: Patient is a 77 year old female known to Dr.  Arlyce Dice for history of GERD, diverticulitis, and colon polyps, and [facility] of colon cancer. A few days ago patient developed severe lower abdomiinal cramping followed by nausea, vomiting and diarrhea. After several episodes of loose stool she had an episode of rectal bleeding. Symptoms spontaneously subsided and patient felt okay the following day. She still feels fine without any recurrent symptoms. Patient tells me she has a history of colon polyps and seems concerned that the episode of rectal bleeding may have been related.   Past Medical History  Diagnosis Date  . Hypercholesterolemia   . Esophageal reflux   . Depression   . Anxiety   . Chronic UTI   . Breast cancer   . Esophageal stricture   . Personal history of colonic polyps 02/09/2007    TUBULAR ADENOMA  . Family history of malignant neoplasm of gastrointestinal tract    Past Surgical History  Procedure Date  . Breast lumpectomy     right  . Umbilical hernia repair   . Dilation and curettage of uterus     reports that she has never smoked. She does not have any smokeless tobacco history on file. She reports that she does not drink alcohol or use illicit drugs. family history includes Clotting disorder in her son; Colon cancer (age of onset:58) in her mother; Heart disease in her sister; Kidney cancer in her mother; Lung cancer in her brother; Stomach cancer in her brother; and Stroke in her sister. Allergies  Allergen Reactions  . Ciprofloxacin   . Clindamycin/Lincomycin   . Diclofenac   . Flexeril (Cyclobenzaprine Hcl)   . Nalfon   . Sulfa Antibiotics       Outpatient Encounter Prescriptions as of 06/26/2011  Medication Sig Dispense Refill  . NITROFURANTOIN PO Take 1 capsule by mouth daily.        Marland Kitchen omeprazole (PRILOSEC) 40 MG capsule Take 40 mg by mouth daily.        Marland Kitchen perphenazine-amitriptyline  (ETRAFON/TRIAVIL) 2-25 MG TABS Take 1 tablet by mouth 2 (two) times daily.        Marland Kitchen PRAVASTATIN SODIUM PO Take 1 capsule by mouth daily.         REVIEW OF SYSTEMS  : All other systems reviewed and negative except where noted in the History of Present Illness.  PHYSICAL EXAM: General: Well developed white female in no acute distress Head: Normocephalic and atraumatic Eyes:  sclerae anicteric,conjunctive pink. Ears: Normal auditory acuity Mouth: No deformity or lesions Neck: Supple, no masses.  Lungs: Clear throughout to auscultation Heart: Regular rate and rhythm; no murmurs heard Abdomen: Soft, non distended, nontender. No masses or hepatomegaly noted. Normal Bowel sounds Rectal: no external lesions. On anoscopic examination there were internal hemorrhoids seen.  Musculoskeletal: Symmetrical with no gross deformities  Skin: No lesions on visible extremities Extremities: No edema or deformities noted Neurological: Alert oriented x 4, grossly nonfocal Cervical Nodes:  No significant cervical adenopathy Psychological:  Alert and cooperative but somewhat anxious.  ASSESSMENT AND PLAN;

## 2011-06-28 NOTE — Assessment & Plan Note (Signed)
Acute nausea, vomiting, diarrhea and abdominal cramps, resolved. Symptoms lasted less than 24 hours. Suspect gastroenteritis. Patient looks fine and abdominal examination is benign. Asymptomatic for over two days now. Patient will call us if she has recurrent symptoms.

## 2011-07-01 NOTE — Progress Notes (Signed)
Agree with Ms. Guenther's assessment and plan. Carl E. Gessner, MD, FACG   

## 2011-07-23 LAB — COMPREHENSIVE METABOLIC PANEL
ALT: 15
AST: 25
Albumin: 3.6
Alkaline Phosphatase: 88
BUN: 13
CO2: 25
Calcium: 8.7
Chloride: 105
Creatinine, Ser: 0.74
GFR calc Af Amer: >60
GFR calc non Af Amer: >60
Glucose, Bld: 87
Potassium: 3.9
Sodium: 136
Total Bilirubin: 0.6
Total Protein: 6.1

## 2011-07-23 LAB — CBC
HCT: 40.5
Hemoglobin: 13.6
MCHC: 33.5
MCV: 89.3
Platelets: 265
RBC: 4.53
RDW: 13.2
WBC: 5.4

## 2011-07-23 LAB — DIFFERENTIAL
Basophils Absolute: 0
Basophils Relative: 0
Eosinophils Absolute: 0.1
Eosinophils Relative: 2
Lymphocytes Relative: 23
Lymphs Abs: 1.3
Monocytes Absolute: 0.4
Monocytes Relative: 8
Neutro Abs: 3.6
Neutrophils Relative %: 67

## 2011-07-31 ENCOUNTER — Other Ambulatory Visit: Payer: Self-pay | Admitting: Oncology

## 2011-07-31 ENCOUNTER — Encounter (HOSPITAL_BASED_OUTPATIENT_CLINIC_OR_DEPARTMENT_OTHER): Payer: PRIVATE HEALTH INSURANCE | Admitting: Oncology

## 2011-07-31 DIAGNOSIS — Z853 Personal history of malignant neoplasm of breast: Secondary | ICD-10-CM

## 2011-07-31 DIAGNOSIS — Z23 Encounter for immunization: Secondary | ICD-10-CM

## 2011-07-31 DIAGNOSIS — C50919 Malignant neoplasm of unspecified site of unspecified female breast: Secondary | ICD-10-CM

## 2011-07-31 DIAGNOSIS — C50419 Malignant neoplasm of upper-outer quadrant of unspecified female breast: Secondary | ICD-10-CM

## 2011-07-31 DIAGNOSIS — Z9889 Other specified postprocedural states: Secondary | ICD-10-CM

## 2011-07-31 DIAGNOSIS — M810 Age-related osteoporosis without current pathological fracture: Secondary | ICD-10-CM

## 2011-07-31 DIAGNOSIS — K219 Gastro-esophageal reflux disease without esophagitis: Secondary | ICD-10-CM

## 2011-07-31 DIAGNOSIS — F329 Major depressive disorder, single episode, unspecified: Secondary | ICD-10-CM

## 2011-07-31 LAB — COMPREHENSIVE METABOLIC PANEL
ALT: 12 U/L (ref 0–35)
AST: 21 U/L (ref 0–37)
Albumin: 4 g/dL (ref 3.5–5.2)
Alkaline Phosphatase: 82 U/L (ref 39–117)
BUN: 13 mg/dL (ref 6–23)
CO2: 23 mEq/L (ref 19–32)
Calcium: 8.9 mg/dL (ref 8.4–10.5)
Chloride: 109 mEq/L (ref 96–112)
Creatinine, Ser: 0.82 mg/dL (ref 0.50–1.10)
Glucose, Bld: 82 mg/dL (ref 70–99)
Potassium: 3.9 mEq/L (ref 3.5–5.3)
Sodium: 143 mEq/L (ref 135–145)
Total Bilirubin: 0.4 mg/dL (ref 0.3–1.2)
Total Protein: 6.2 g/dL (ref 6.0–8.3)

## 2011-07-31 LAB — CBC WITH DIFFERENTIAL/PLATELET
BASO%: 0.3 % (ref 0.0–2.0)
Basophils Absolute: 0 10*3/uL (ref 0.0–0.1)
EOS%: 1.9 % (ref 0.0–7.0)
Eosinophils Absolute: 0.1 10*3/uL (ref 0.0–0.5)
HCT: 39.1 % (ref 34.8–46.6)
HGB: 13.2 g/dL (ref 11.6–15.9)
LYMPH%: 27.5 % (ref 14.0–49.7)
MCH: 31.5 pg (ref 25.1–34.0)
MCHC: 33.7 g/dL (ref 31.5–36.0)
MCV: 93.5 fL (ref 79.5–101.0)
MONO#: 0.5 10*3/uL (ref 0.1–0.9)
MONO%: 10.6 % (ref 0.0–14.0)
NEUT#: 2.8 10*3/uL (ref 1.5–6.5)
NEUT%: 59.7 % (ref 38.4–76.8)
Platelets: 162 10*3/uL (ref 145–400)
RBC: 4.18 10*6/uL (ref 3.70–5.45)
RDW: 13.1 % (ref 11.2–14.5)
WBC: 4.6 10*3/uL (ref 3.9–10.3)
lymph#: 1.3 10*3/uL (ref 0.9–3.3)

## 2011-10-29 ENCOUNTER — Ambulatory Visit
Admission: RE | Admit: 2011-10-29 | Discharge: 2011-10-29 | Disposition: A | Discharge status: Home / Self Care | Payer: PRIVATE HEALTH INSURANCE | Source: Ambulatory Visit | Attending: Oncology | Admitting: Oncology

## 2011-10-29 DIAGNOSIS — Z9889 Other specified postprocedural states: Secondary | ICD-10-CM

## 2011-10-29 DIAGNOSIS — C50919 Malignant neoplasm of unspecified site of unspecified female breast: Secondary | ICD-10-CM

## 2011-11-28 ENCOUNTER — Telehealth: Payer: Self-pay | Admitting: Oncology

## 2011-11-28 NOTE — Telephone Encounter (Signed)
Talked to pt, gave her appt for 01/13/12 lab and MD

## 2011-12-16 NOTE — Progress Notes (Signed)
This encounter was created in error - please disregard.

## 2011-12-17 ENCOUNTER — Encounter: Payer: Self-pay | Admitting: Gastroenterology

## 2011-12-22 ENCOUNTER — Encounter: Payer: Self-pay | Admitting: Gastroenterology

## 2012-01-07 DIAGNOSIS — C50511 Malignant neoplasm of lower-outer quadrant of right female breast: Secondary | ICD-10-CM | POA: Insufficient documentation

## 2012-01-13 ENCOUNTER — Ambulatory Visit (HOSPITAL_BASED_OUTPATIENT_CLINIC_OR_DEPARTMENT_OTHER): Payer: Medicare Other | Admitting: Oncology

## 2012-01-13 ENCOUNTER — Telehealth: Payer: Self-pay | Admitting: Oncology

## 2012-01-13 ENCOUNTER — Other Ambulatory Visit (HOSPITAL_BASED_OUTPATIENT_CLINIC_OR_DEPARTMENT_OTHER): Payer: Medicare Other

## 2012-01-13 VITALS — BP 157/78 | HR 58 | Temp 97.4°F | Ht 62.0 in | Wt 159.7 lb

## 2012-01-13 DIAGNOSIS — C50419 Malignant neoplasm of upper-outer quadrant of unspecified female breast: Secondary | ICD-10-CM

## 2012-01-13 DIAGNOSIS — Z853 Personal history of malignant neoplasm of breast: Secondary | ICD-10-CM

## 2012-01-13 DIAGNOSIS — K219 Gastro-esophageal reflux disease without esophagitis: Secondary | ICD-10-CM

## 2012-01-13 DIAGNOSIS — F329 Major depressive disorder, single episode, unspecified: Secondary | ICD-10-CM

## 2012-01-13 DIAGNOSIS — C50919 Malignant neoplasm of unspecified site of unspecified female breast: Secondary | ICD-10-CM

## 2012-01-13 DIAGNOSIS — E785 Hyperlipidemia, unspecified: Secondary | ICD-10-CM

## 2012-01-13 LAB — CBC WITH DIFFERENTIAL/PLATELET
BASO%: 0.1 % (ref 0.0–2.0)
Basophils Absolute: 0 10*3/uL (ref 0.0–0.1)
EOS%: 1.6 % (ref 0.0–7.0)
Eosinophils Absolute: 0.1 10*3/uL (ref 0.0–0.5)
HCT: 39.8 % (ref 34.8–46.6)
HGB: 13.3 g/dL (ref 11.6–15.9)
LYMPH%: 27.1 % (ref 14.0–49.7)
MCH: 31.3 pg (ref 25.1–34.0)
MCHC: 33.5 g/dL (ref 31.5–36.0)
MCV: 93.3 fL (ref 79.5–101.0)
MONO#: 0.3 10*3/uL (ref 0.1–0.9)
MONO%: 6.8 % (ref 0.0–14.0)
NEUT#: 3.1 10*3/uL (ref 1.5–6.5)
NEUT%: 64.4 % (ref 38.4–76.8)
Platelets: 182 10*3/uL (ref 145–400)
RBC: 4.26 10*6/uL (ref 3.70–5.45)
RDW: 13.2 % (ref 11.2–14.5)
WBC: 4.9 10*3/uL (ref 3.9–10.3)
lymph#: 1.3 10*3/uL (ref 0.9–3.3)

## 2012-01-13 LAB — COMPREHENSIVE METABOLIC PANEL
ALT: 13 U/L (ref 0–35)
AST: 20 U/L (ref 0–37)
Albumin: 4.2 g/dL (ref 3.5–5.2)
Alkaline Phosphatase: 83 U/L (ref 39–117)
BUN: 14 mg/dL (ref 6–23)
CO2: 25 mEq/L (ref 19–32)
Calcium: 8.9 mg/dL (ref 8.4–10.5)
Chloride: 107 mEq/L (ref 96–112)
Creatinine, Ser: 0.86 mg/dL (ref 0.50–1.10)
Glucose, Bld: 94 mg/dL (ref 70–99)
Potassium: 3.8 mEq/L (ref 3.5–5.3)
Sodium: 141 mEq/L (ref 135–145)
Total Bilirubin: 0.4 mg/dL (ref 0.3–1.2)
Total Protein: 6.4 g/dL (ref 6.0–8.3)

## 2012-01-13 NOTE — Telephone Encounter (Signed)
gv pt appt schedule for sept °

## 2012-01-13 NOTE — Progress Notes (Signed)
[facility] OFFICE PROGRESS NOTE  Cc:  Pamelia Hoit, MD, MD  DIAGNOSIS:  history of stage I right inflammatory breast carcinoma.  Tumor was 0.5 cm estrogen positive, PR negative, HER2/neu positive without FISH amplification; 0 out of 2 lymph nodes positive.  PAST THERAPY:   1.  Status post lumpectomy, followed by adjuvant radiation therapy to the right breast. 2.  Aromasin 25 mg p.o. daily between  April of 2006 and April 2011.  CURRENT THERAPY:  Watchful observation.  INTERVAL HISTORY: Natalie West 78 y.o. female returns for regular follow up.  She reports feeling well.  She has mild depression; however, this is much improved compared to when she was on Aromasin.  She has been able to feel some thickening of the skin for the past 6 months between the right and left breast near the xyphoid process.  This skin thickening is stable without erythema.    Patient denies fatigue, headache, visual changes, confusion, drenching night sweats, palpable lymph node swelling, mucositis, odynophagia, dysphagia, nausea vomiting, jaundice, chest pain, palpitation, shortness of breath, dyspnea on exertion, productive cough, gum bleeding, epistaxis, hematemesis, hemoptysis, abdominal pain, abdominal swelling, early satiety, melena, hematochezia, hematuria, skin rash, spontaneous bleeding, joint swelling, joint pain, heat or cold intolerance, bowel bladder incontinence, back pain, focal motor weakness, paresthesia, suicidal or homocidal ideation, feeling hopelessness.   Past Medical History  Diagnosis Date  . Hypercholesterolemia   . Esophageal reflux   . Depression   . Anxiety   . Chronic UTI   . Breast cancer   . Esophageal stricture   . Personal history of colonic polyps 02/09/2007    TUBULAR ADENOMA  . Family history of malignant neoplasm of gastrointestinal tract     Past Surgical History  Procedure Date  . Breast lumpectomy     right  . Umbilical hernia repair   . Dilation and  curettage of uterus     Current Outpatient Prescriptions  Medication Sig Dispense Refill  . Calcium Carbonate-Vitamin D (CALCIUM 600 + D PO) Take 1 capsule by mouth daily.      . Calcium Polycarbophil (FIBERCON PO) Take 4 capsules by mouth daily. Equate Fiber      . Multiple Vitamins-Minerals (CENTRUM SILVER PO) Take 1 tablet by mouth daily.      . nitrofurantoin (MACRODANTIN) 50 MG capsule Take 50 mg by mouth daily.      Marland Kitchen omeprazole (PRILOSEC) 40 MG capsule Take 40 mg by mouth daily as needed.      Marland Kitchen perphenazine-amitriptyline (ETRAFON/TRIAVIL) 2-10 MG TABS Take 1 tablet by mouth 2 (two) times daily.      . pravastatin (PRAVACHOL) 20 MG tablet Take 20 mg by mouth daily.        ALLERGIES:  is allergic to ciprofloxacin; clindamycin/lincomycin; codeine; diclofenac; flexeril; nalfon; and sulfa antibiotics.  REVIEW OF SYSTEMS:  The rest of the 14-point review of system was negative.   Filed Vitals:   01/13/12 1037  BP: 157/78  Pulse: 58  Temp: 97.4 F (36.3 C)   Wt Readings from Last 3 Encounters:  01/13/12 159 lb 11.2 oz (72.439 kg)  06/29/11 156 lb 3.2 oz (70.852 kg)  06/26/11 156 lb 3.2 oz (70.852 kg)   ECOG Performance status: 0  PHYSICAL EXAMINATION:  General:  well-nourished in no acute distress.  Eyes:  no scleral icterus.  ENT:  There were no oropharyngeal lesions.  Neck was without thyromegaly.  Lymphatics:  Negative cervical, supraclavicular or axillary adenopathy.  Respiratory: lungs  were clear bilaterally without wheezing or crackles.  Cardiovascular:  Regular rate and rhythm, S1/S2, without murmur, rub or gallop.  There was no pedal edema.  GI:  abdomen was soft, flat, nontender, nondistended, without organomegaly.  Muscoloskeletal:  no spinal tenderness of palpation of vertebral spine.  Skin exam was without echymosis, petichae.  Neuro exam was nonfocal.  Patient was able to get on and off exam table without assistance.  Gait was normal.  Patient was alerted and oriented.   Attention was good.   Language was appropriate.  Mood was normal without depression.  Speech was not pressured.  Thought content was not tangential.   Bilateral breast exam was negative.  I was able to palpate some skin thickening near the xyphoid process without erythema, purulent discharge.     LABORATORY/RADIOLOGY DATA:  Lab Results  Component Value Date   WBC 4.9 01/13/2012   HGB 13.3 01/13/2012   HCT 39.8 01/13/2012   PLT 182 01/13/2012   GLUCOSE 94 01/13/2012   ALT 13 01/13/2012   AST 20 01/13/2012   NA 141 01/13/2012   K 3.8 01/13/2012   CL 107 01/13/2012   CREATININE 0.86 01/13/2012   BUN 14 01/13/2012   CO2 25 01/13/2012    ASSESSMENT AND PLAN:   1. History of breast cancer:  I discussed with Ms. Rivers that she has no evidence of recurrent breast cancer or metastatic disease on today's clinical history, physical exam, laboratory tests.  Her last breast mammogram in 10/2011 was negative.  The skin thickening appears benign on my exam today.  She is due to diagnostic bilateral mammogram in 10/2012.    2. History of depression:  Improved being off of AI.  3. Gastroesophageal reflux disease.  She is on omeprazole. 4. Hyperlipidemia.  She is on pravastatin. 5. F/U with me in about 6 months.  The length of time of the face-to-face encounter was 10 minutes. More than 50% of time was spent counseling and coordination of care.

## 2012-02-02 ENCOUNTER — Encounter: Payer: Self-pay | Admitting: Gastroenterology

## 2012-02-02 ENCOUNTER — Ambulatory Visit (AMBULATORY_SURGERY_CENTER): Payer: Medicare Other

## 2012-02-02 VITALS — Ht 63.5 in | Wt 158.4 lb

## 2012-02-02 DIAGNOSIS — Z800 Family history of malignant neoplasm of digestive organs: Secondary | ICD-10-CM

## 2012-02-02 DIAGNOSIS — Z1211 Encounter for screening for malignant neoplasm of colon: Secondary | ICD-10-CM

## 2012-02-02 DIAGNOSIS — Z860100 Personal history of colon polyps, unspecified: Secondary | ICD-10-CM

## 2012-02-02 MED ORDER — PEG-KCL-NACL-NASULF-NA ASC-C 100 G PO SOLR: 1.0000 | Freq: Once | ORAL | Status: AC

## 2012-02-09 ENCOUNTER — Telehealth: Payer: Self-pay | Admitting: Gastroenterology

## 2012-02-09 NOTE — Telephone Encounter (Signed)
Pt wanted Dr. Arlyce Dice to be aware that she had umbilical hernia surgery and was told that would not interfere with her procedure

## 2012-02-16 ENCOUNTER — Encounter: Payer: Self-pay | Admitting: Gastroenterology

## 2012-02-16 ENCOUNTER — Ambulatory Visit (AMBULATORY_SURGERY_CENTER): Payer: Medicare Other | Admitting: Gastroenterology

## 2012-02-16 VITALS — BP 158/75 | HR 58 | Temp 97.1°F | Resp 17 | Ht 63.0 in | Wt 158.0 lb

## 2012-02-16 DIAGNOSIS — Z1211 Encounter for screening for malignant neoplasm of colon: Secondary | ICD-10-CM

## 2012-02-16 DIAGNOSIS — Z860100 Personal history of colon polyps, unspecified: Secondary | ICD-10-CM

## 2012-02-16 DIAGNOSIS — Z800 Family history of malignant neoplasm of digestive organs: Secondary | ICD-10-CM

## 2012-02-16 MED ORDER — SODIUM CHLORIDE 0.9 % IV SOLN: 500.0000 mL | INTRAVENOUS | Status: DC

## 2012-02-16 NOTE — Patient Instructions (Signed)
YOU HAD AN ENDOSCOPIC PROCEDURE TODAY AT THE [facility]: Refer to the procedure report that was given to you for any specific questions about what was found during the examination.  If the procedure report does not answer your questions, please call your gastroenterologist to clarify.  If you requested that your care partner not be given the details of your procedure findings, then the procedure report has been included in a sealed envelope for you to review at your convenience later.  YOU SHOULD EXPECT: Some feelings of bloating in the abdomen. Passage of more gas than usual.  Walking can help get rid of the air that was put into your GI tract during the procedure and reduce the bloating. If you had a lower endoscopy (such as a colonoscopy or flexible sigmoidoscopy) you may notice spotting of blood in your stool or on the toilet paper. If you underwent a bowel prep for your procedure, then you may not have a normal bowel movement for a few days.  DIET: Your first meal following the procedure should be a light meal and then it is ok to progress to your normal diet.  A half-sandwich or bowl of soup is an example of a good first meal.  Heavy or fried foods are harder to digest and may make you feel nauseous or bloated.  Likewise meals heavy in dairy and vegetables can cause extra gas to form and this can also increase the bloating.  Drink plenty of fluids but you should avoid alcoholic beverages for 24 hours.  ACTIVITY: Your care partner should take you home directly after the procedure.  You should plan to take it easy, moving slowly for the rest of the day.  You can resume normal activity the day after the procedure however you should NOT DRIVE or use heavy machinery for 24 hours (because of the sedation medicines used during the test).    SYMPTOMS TO REPORT IMMEDIATELY: A gastroenterologist can be reached at any hour.  During normal business hours, 8:30 AM to 5:00 PM Monday through Friday,  call [phone].  After hours and on weekends, please call the GI answering service at [phone] who will take a message and have the physician on call contact you.   Following lower endoscopy (colonoscopy or flexible sigmoidoscopy):  Excessive amounts of blood in the stool  Significant tenderness or worsening of abdominal pains  Swelling of the abdomen that is new, acute  Fever of 100F or higher  Following upper endoscopy (EGD)  Vomiting of blood or coffee ground material  New chest pain or pain under the shoulder blades  Painful or persistently difficult swallowing  New shortness of breath  Fever of 100F or higher  Black, tarry-looking stools  FOLLOW UP: If any biopsies were taken you will be contacted by phone or by letter within the next 1-3 weeks.  Call your gastroenterologist if you have not heard about the biopsies in 3 weeks.  Our staff will call the home number listed on your records the next business day following your procedure to check on you and address any questions or concerns that you may have at that time regarding the information given to you following your procedure. This is a courtesy call and so if there is no answer at the home number and we have not heard from you through the emergency physician on call, we will assume that you have returned to your regular daily activities without incident.  SIGNATURES/CONFIDENTIALITY: You and/or your care   partner have signed paperwork which will be entered into your electronic medical record.  These signatures attest to the fact that that the information above on your After Visit Summary has been reviewed and is understood.  Full responsibility of the confidentiality of this discharge information lies with you and/or your care-partner.  

## 2012-02-16 NOTE — Progress Notes (Signed)
PROPOFOL PER S CAMP CRNA. SEE SCANNED INTRA PROCEDURE REPORT. EWM 

## 2012-02-16 NOTE — Progress Notes (Signed)
Patient complained of mid abdominal pain after arrival to recovery, 10/10. Questioned if the worst pain in her life she stated no.patient changed positions lt to rt with trendelenberg. levsin 0.25 given sl. Patient at 1000 stated pain less than 4. Able to drink water. Abd. Soft and non tender. Patient and husband ready for dc.

## 2012-02-16 NOTE — Op Note (Signed)
[facility] [address] [place], [place]  [address]  COLONOSCOPY PROCEDURE REPORT  PATIENT:  Jahzaria, Vary  MR#:  [id number] BIRTHDATE:  01-Sep-1931, 80 yrs. old  GENDER:  female ENDOSCOPIST:  Barbette Hair. Arlyce Dice, MD REF. BY:  Benedetto Goad, MD PROCEDURE DATE:  02/16/2012 PROCEDURE:  Diagnostic Colonoscopy ASA CLASS:  Class II INDICATIONS:  Screening, family history of colon cancer, history of pre-cancerous (adenomatous) colon polyps Mother MEDICATIONS:   MAC sedation, administered by CRNA propofol 130mg IV  DESCRIPTION OF PROCEDURE:   After the risks benefits and alternatives of the procedure were thoroughly explained, informed consent was obtained.  Digital rectal exam was performed and revealed no abnormalities.   The LB 180AL [id number] endoscope was introduced through the anus and advanced to the cecum, which was identified by both the appendix and ileocecal valve, without limitations.  The quality of the prep was good, using MoviPrep. The instrument was then slowly withdrawn as the colon was fully examined. <<PROCEDUREIMAGES>>  FINDINGS:  A normal appearing cecum, ileocecal valve, and appendiceal orifice were identified. The ascending, hepatic flexure, transverse, splenic flexure, descending, sigmoid colon, and rectum appeared unremarkable (see image1 and image2). Retroflexed views in the rectum revealed Unable to retroflex.    The time to cecum =  1) 5.75  minutes. The scope was then withdrawn in 1) 7.50  minutes from the cecum and the procedure completed. COMPLICATIONS:  None ENDOSCOPIC IMPRESSION: 1) Normal colon  RECOMMENDATIONS: 1) Given your age, you will not need another colonoscopy for colon cancer screening or polyp surveillance. These types of tests usually stop around the age 50. REPEAT EXAM:  No  ______________________________ Barbette Hair. Arlyce Dice, MD  CC:  n. eSIGNED:   Barbette Hair. Charrisse Masley at 02/16/2012 09:15 AM  Konrad Dolores, [phone]

## 2012-02-16 NOTE — Progress Notes (Signed)
Patient did not experience any of the following events: a burn prior to discharge; a fall within the facility; wrong site/side/patient/procedure/implant event; or a hospital transfer or hospital admission upon discharge from the facility. (G8907) Patient did not have preoperative order for IV antibiotic SSI prophylaxis. (G8918)  

## 2012-02-17 ENCOUNTER — Telehealth: Payer: Self-pay | Admitting: *Deleted

## 2012-02-17 NOTE — Telephone Encounter (Signed)
  Follow up Call-  Call back number 02/16/2012  Post procedure Call Back phone  # [phone]  Permission to leave phone message No  comments no voice mail     Patient questions:  Do you have a fever, pain , or abdominal swelling? no Pain Score  0 *  Have you tolerated food without any problems? yes  Have you been able to return to your normal activities? yes  Do you have any questions about your discharge instructions: Diet   no Medications  no Follow up visit  no  Do you have questions or concerns about your Care? no  Actions: * If pain score is 4 or above: No action needed, pain <4.

## 2012-07-18 ENCOUNTER — Telehealth: Payer: Self-pay | Admitting: Oncology

## 2012-07-18 ENCOUNTER — Ambulatory Visit (HOSPITAL_BASED_OUTPATIENT_CLINIC_OR_DEPARTMENT_OTHER): Payer: Medicare Other | Admitting: Oncology

## 2012-07-18 ENCOUNTER — Encounter: Payer: Self-pay | Admitting: Oncology

## 2012-07-18 ENCOUNTER — Other Ambulatory Visit (HOSPITAL_BASED_OUTPATIENT_CLINIC_OR_DEPARTMENT_OTHER): Payer: Medicare Other | Admitting: Lab

## 2012-07-18 VITALS — BP 146/83 | HR 66 | Temp 97.7°F | Resp 20 | Ht 63.0 in | Wt 159.9 lb

## 2012-07-18 DIAGNOSIS — F329 Major depressive disorder, single episode, unspecified: Secondary | ICD-10-CM

## 2012-07-18 DIAGNOSIS — Z853 Personal history of malignant neoplasm of breast: Secondary | ICD-10-CM

## 2012-07-18 DIAGNOSIS — C50919 Malignant neoplasm of unspecified site of unspecified female breast: Secondary | ICD-10-CM

## 2012-07-18 LAB — CBC WITH DIFFERENTIAL/PLATELET
BASO%: 0.3 % (ref 0.0–2.0)
Basophils Absolute: 0 10*3/uL (ref 0.0–0.1)
EOS%: 1.4 % (ref 0.0–7.0)
Eosinophils Absolute: 0.1 10*3/uL (ref 0.0–0.5)
HCT: 38.7 % (ref 34.8–46.6)
HGB: 13.1 g/dL (ref 11.6–15.9)
LYMPH%: 25.8 % (ref 14.0–49.7)
MCH: 32.3 pg (ref 25.1–34.0)
MCHC: 34 g/dL (ref 31.5–36.0)
MCV: 95.2 fL (ref 79.5–101.0)
MONO#: 0.4 10*3/uL (ref 0.1–0.9)
MONO%: 7.8 % (ref 0.0–14.0)
NEUT#: 3.7 10*3/uL (ref 1.5–6.5)
NEUT%: 64.7 % (ref 38.4–76.8)
Platelets: 159 10*3/uL (ref 145–400)
RBC: 4.07 10*6/uL (ref 3.70–5.45)
RDW: 13.1 % (ref 11.2–14.5)
WBC: 5.7 10*3/uL (ref 3.9–10.3)
lymph#: 1.5 10*3/uL (ref 0.9–3.3)

## 2012-07-18 LAB — COMPREHENSIVE METABOLIC PANEL (CC13)
ALT: 13 U/L (ref 0–55)
AST: 21 U/L (ref 5–34)
Albumin: 3.6 g/dL (ref 3.5–5.0)
Alkaline Phosphatase: 84 U/L (ref 40–150)
BUN: 15 mg/dL (ref 7.0–26.0)
CO2: 28 mEq/L (ref 22–29)
Calcium: 9 mg/dL (ref 8.4–10.4)
Chloride: 107 mEq/L (ref 98–107)
Creatinine: 0.8 mg/dL (ref 0.6–1.1)
Glucose: 97 mg/dl (ref 70–99)
Potassium: 3.9 mEq/L (ref 3.5–5.1)
Sodium: 142 mEq/L (ref 136–145)
Total Bilirubin: 0.4 mg/dL (ref 0.20–1.20)
Total Protein: 6.1 g/dL — ABNORMAL LOW (ref 6.4–8.3)

## 2012-07-18 NOTE — Progress Notes (Signed)
[facility] OFFICE PROGRESS NOTE  Cc:  Pamelia Hoit, MD  DIAGNOSIS:  history of stage I right inflammatory breast carcinoma.  Tumor was 0.5 cm estrogen positive, PR negative, HER2/neu positive without FISH amplification; 0 out of 2 lymph nodes positive.  PAST THERAPY:   1.  Status post lumpectomy, followed by adjuvant radiation therapy to the right breast. 2.  Aromasin 25 mg p.o. daily between  April of 2006 and April 2011.  CURRENT THERAPY:  Watchful observation.  INTERVAL HISTORY: Natalie West 78 y.o. female returns for regular follow up.  She reports feeling well.  She has mild depression; however, this is much improved compared to when she was on Aromasin.  She has been able to feel some thickening of the skin for the past 6 months between the right and left breast near the xyphoid process.  This skin thickening is stable without erythema. She has not noticed any new lumps or bumps.   Patient denies fatigue, headache, visual changes, confusion, drenching night sweats, palpable lymph node swelling, mucositis, odynophagia, dysphagia, nausea vomiting, jaundice, chest pain, palpitation, shortness of breath, dyspnea on exertion, productive cough, gum bleeding, epistaxis, hematemesis, hemoptysis, abdominal pain, abdominal swelling, early satiety, melena, hematochezia, hematuria, skin rash, spontaneous bleeding, joint swelling, joint pain, heat or cold intolerance, bowel bladder incontinence, back pain, focal motor weakness, paresthesia, suicidal or homocidal ideation, feeling hopelessness.   Past Medical History  Diagnosis Date  . Hypercholesterolemia   . Esophageal reflux   . Depression   . Anxiety   . Chronic UTI   . Breast cancer   . Esophageal stricture   . Personal history of colonic polyps 02/09/2007    TUBULAR ADENOMA  . Family history of malignant neoplasm of gastrointestinal tract     Past Surgical History  Procedure Date  . Breast lumpectomy     right  .  Umbilical hernia repair   . Dilation and curettage of uterus   . Colonoscopy   . Polypectomy     Current Outpatient Prescriptions  Medication Sig Dispense Refill  . Calcium Carbonate-Vitamin D (CALCIUM 600 + D PO) Take 1 capsule by mouth daily.      . Calcium Polycarbophil (FIBERCON PO) Take 4 capsules by mouth daily. Equate Fiber      . Multiple Vitamins-Minerals (CENTRUM SILVER PO) Take 1 tablet by mouth daily.      . nitrofurantoin (MACRODANTIN) 50 MG capsule Take 50 mg by mouth daily.      Marland Kitchen omeprazole (PRILOSEC) 40 MG capsule Take 40 mg by mouth daily as needed.      Marland Kitchen perphenazine-amitriptyline (ETRAFON/TRIAVIL) 2-10 MG TABS Take 1 tablet by mouth 2 (two) times daily.      . pravastatin (PRAVACHOL) 20 MG tablet Take 20 mg by mouth daily.        ALLERGIES:  is allergic to ciprofloxacin; clindamycin/lincomycin; codeine; diclofenac; fenoprofen calcium; flexeril; and sulfa antibiotics.  REVIEW OF SYSTEMS:  The rest of the 14-point review of system was negative.   Filed Vitals:   07/18/12 1329  BP: 146/83  Pulse: 66  Temp: 97.7 F (36.5 C)  Resp: 20   Wt Readings from Last 3 Encounters:  07/18/12 159 lb 14.4 oz (72.53 kg)  02/16/12 158 lb (71.668 kg)  02/02/12 158 lb 6.4 oz (71.85 kg)   ECOG Performance status: 0  PHYSICAL EXAMINATION:  General:  well-nourished in no acute distress.  Eyes:  no scleral icterus.  ENT:  There were no oropharyngeal lesions.  Neck was without thyromegaly.  Lymphatics:  Negative cervical, supraclavicular or axillary adenopathy.  Respiratory: lungs were clear bilaterally without wheezing or crackles.  Cardiovascular:  Regular rate and rhythm, S1/S2, without murmur, rub or gallop.  There was no pedal edema.  GI:  abdomen was soft, flat, nontender, nondistended, without organomegaly.  Muscoloskeletal:  no spinal tenderness of palpation of vertebral spine.  Skin exam was without echymosis, petichae.  Neuro exam was nonfocal.  Patient was able to get on  and off exam table without assistance.  Gait was normal.  Patient was alerted and oriented.  Attention was good.   Language was appropriate.  Mood was normal without depression.  Speech was not pressured.  Thought content was not tangential.   Bilateral breast exam was negative.  I was able to palpate some skin thickening near the xyphoid process without erythema, purulent discharge.     LABORATORY/RADIOLOGY DATA:  Lab Results  Component Value Date   WBC 5.7 07/18/2012   HGB 13.1 07/18/2012   HCT 38.7 07/18/2012   PLT 159 07/18/2012   GLUCOSE 97 07/18/2012   ALT 13 07/18/2012   AST 21 07/18/2012   NA 142 07/18/2012   K 3.9 07/18/2012   CL 107 07/18/2012   CREATININE 0.8 07/18/2012   BUN 15.0 07/18/2012   CO2 28 07/18/2012    ASSESSMENT AND PLAN:   1. History of breast cancer:  I discussed with Natalie West that she has no evidence of recurrent breast cancer or metastatic disease on today's clinical history, physical exam, laboratory tests.  Her last breast mammogram in 10/2011 was negative.  The skin thickening appears benign on my exam today.  She is due to diagnostic bilateral mammogram in 10/2012 which I have scheduled for her today. 2. History of depression:  Improved being off of AI.  3. Gastroesophageal reflux disease.  She is on omeprazole as needed. 4. Hyperlipidemia.  She is on pravastatin. 5. Follow-up: In 8-9 months.  The length of time of the face-to-face encounter was 10 minutes. More than 50% of time was spent counseling and coordination of care.

## 2012-07-18 NOTE — Telephone Encounter (Signed)
gv pt appt schedule for may 2014 and mammo December 2013.

## 2012-10-31 ENCOUNTER — Ambulatory Visit
Admission: RE | Admit: 2012-10-31 | Discharge: 2012-10-31 | Disposition: A | Discharge status: Home / Self Care | Payer: Medicare Other | Source: Ambulatory Visit | Attending: Oncology | Admitting: Oncology

## 2012-10-31 DIAGNOSIS — C50919 Malignant neoplasm of unspecified site of unspecified female breast: Secondary | ICD-10-CM

## 2012-12-10 ENCOUNTER — Emergency Department (HOSPITAL_COMMUNITY)
Admission: EM | Admit: 2012-12-10 | Discharge: 2012-12-10 | Disposition: A | Discharge status: Home / Self Care | Payer: Medicare Other | Attending: Emergency Medicine | Admitting: Emergency Medicine

## 2012-12-10 ENCOUNTER — Encounter (HOSPITAL_COMMUNITY): Payer: Self-pay | Admitting: Emergency Medicine

## 2012-12-10 ENCOUNTER — Emergency Department (HOSPITAL_COMMUNITY): Payer: Medicare Other

## 2012-12-10 DIAGNOSIS — R05 Cough: Secondary | ICD-10-CM | POA: Insufficient documentation

## 2012-12-10 DIAGNOSIS — Z860100 Personal history of colon polyps, unspecified: Secondary | ICD-10-CM | POA: Insufficient documentation

## 2012-12-10 DIAGNOSIS — F411 Generalized anxiety disorder: Secondary | ICD-10-CM | POA: Insufficient documentation

## 2012-12-10 DIAGNOSIS — R300 Dysuria: Secondary | ICD-10-CM | POA: Insufficient documentation

## 2012-12-10 DIAGNOSIS — R0989 Other specified symptoms and signs involving the circulatory and respiratory systems: Secondary | ICD-10-CM | POA: Insufficient documentation

## 2012-12-10 DIAGNOSIS — E7800 Pure hypercholesterolemia, unspecified: Secondary | ICD-10-CM | POA: Insufficient documentation

## 2012-12-10 DIAGNOSIS — J029 Acute pharyngitis, unspecified: Secondary | ICD-10-CM | POA: Insufficient documentation

## 2012-12-10 DIAGNOSIS — N390 Urinary tract infection, site not specified: Secondary | ICD-10-CM | POA: Insufficient documentation

## 2012-12-10 DIAGNOSIS — F3289 Other specified depressive episodes: Secondary | ICD-10-CM | POA: Insufficient documentation

## 2012-12-10 DIAGNOSIS — R3589 Other polyuria: Secondary | ICD-10-CM | POA: Insufficient documentation

## 2012-12-10 DIAGNOSIS — K219 Gastro-esophageal reflux disease without esophagitis: Secondary | ICD-10-CM | POA: Insufficient documentation

## 2012-12-10 DIAGNOSIS — R059 Cough, unspecified: Secondary | ICD-10-CM | POA: Insufficient documentation

## 2012-12-10 DIAGNOSIS — F329 Major depressive disorder, single episode, unspecified: Secondary | ICD-10-CM | POA: Insufficient documentation

## 2012-12-10 DIAGNOSIS — R358 Other polyuria: Secondary | ICD-10-CM | POA: Insufficient documentation

## 2012-12-10 DIAGNOSIS — R042 Hemoptysis: Secondary | ICD-10-CM | POA: Insufficient documentation

## 2012-12-10 DIAGNOSIS — Z853 Personal history of malignant neoplasm of breast: Secondary | ICD-10-CM | POA: Insufficient documentation

## 2012-12-10 DIAGNOSIS — Z8719 Personal history of other diseases of the digestive system: Secondary | ICD-10-CM | POA: Insufficient documentation

## 2012-12-10 DIAGNOSIS — Z79899 Other long term (current) drug therapy: Secondary | ICD-10-CM | POA: Insufficient documentation

## 2012-12-10 DIAGNOSIS — Z792 Long term (current) use of antibiotics: Secondary | ICD-10-CM | POA: Insufficient documentation

## 2012-12-10 LAB — URINALYSIS, ROUTINE W REFLEX MICROSCOPIC
Bilirubin Urine: NEGATIVE
Glucose, UA: NEGATIVE mg/dL
Hgb urine dipstick: NEGATIVE
Ketones, ur: NEGATIVE mg/dL
Nitrite: NEGATIVE
Protein, ur: NEGATIVE mg/dL
Specific Gravity, Urine: 1.009 (ref 1.005–1.030)
Urobilinogen, UA: 0.2 mg/dL (ref 0.0–1.0)
pH: 6 (ref 5.0–8.0)

## 2012-12-10 LAB — CBC WITH DIFFERENTIAL/PLATELET
Basophils Absolute: 0 10*3/uL (ref 0.0–0.1)
Basophils Relative: 0 % (ref 0–1)
Eosinophils Absolute: 0.2 10*3/uL (ref 0.0–0.7)
Eosinophils Relative: 3 % (ref 0–5)
HCT: 37.6 % (ref 36.0–46.0)
Hemoglobin: 12.4 g/dL (ref 12.0–15.0)
Lymphocytes Relative: 21 % (ref 12–46)
Lymphs Abs: 1.4 10*3/uL (ref 0.7–4.0)
MCH: 31.6 pg (ref 26.0–34.0)
MCHC: 33 g/dL (ref 30.0–36.0)
MCV: 95.7 fL (ref 78.0–100.0)
Monocytes Absolute: 0.6 10*3/uL (ref 0.1–1.0)
Monocytes Relative: 10 % (ref 3–12)
Neutro Abs: 4.3 10*3/uL (ref 1.7–7.7)
Neutrophils Relative %: 66 % (ref 43–77)
Platelets: 225 10*3/uL (ref 150–400)
RBC: 3.93 MIL/uL (ref 3.87–5.11)
RDW: 12.8 % (ref 11.5–15.5)
WBC: 6.6 10*3/uL (ref 4.0–10.5)

## 2012-12-10 LAB — BASIC METABOLIC PANEL
BUN: 14 mg/dL (ref 6–23)
CO2: 25 mEq/L (ref 19–32)
Calcium: 8.7 mg/dL (ref 8.4–10.5)
Chloride: 106 mEq/L (ref 96–112)
Creatinine, Ser: 0.7 mg/dL (ref 0.50–1.10)
GFR calc Af Amer: >90 mL/min (ref >90)
GFR calc non Af Amer: 80 mL/min — ABNORMAL LOW (ref >90)
Glucose, Bld: 88 mg/dL (ref 70–99)
Potassium: 3.9 mEq/L (ref 3.5–5.1)
Sodium: 141 mEq/L (ref 135–145)

## 2012-12-10 LAB — URINE MICROSCOPIC-ADD ON

## 2012-12-10 NOTE — ED Notes (Signed)
Pt c/o coughing up blood this morning at 0800. Pt reports having a cold since December and has had dry cough since. Pt denies fever and sweating at night. Pt presents with kleenex with dark red blood on it. Pt reports that her throat is sore and raw. Throat visualized with no abnormalities in sight. Bowel sounds audible, last BM at 1100 today.

## 2012-12-10 NOTE — ED Provider Notes (Signed)
History     CSN: [id number]  Arrival date & time 12/10/12  1400   First MD Initiated Contact with Patient 12/10/12 1507      Chief Complaint  Patient presents with  . Hemoptysis     HPI The patient presents after one episode of hemoptysis.  She notes a history of chronic cough, and increased cough for the past 6 weeks.  During this period she has had antibiotics, antitussives, but continues to have cough, chest congestion, generalized discomfort.  The symptoms are slightly improved over the past week. Today, approximately 6 hours prior to my evaluation the patient is single episode of hemoptysis.  He was painless.  There is no associated lightheadedness, chest pain, nausea, vomiting, chills. The patient also states that she is recurrent urinary tract infection, and is currently taking antibiotics.  She continues to have dysuria.   Past Medical History  Diagnosis Date  . Hypercholesterolemia   . Esophageal reflux   . Depression   . Anxiety   . Chronic UTI   . Breast cancer   . Esophageal stricture   . Personal history of colonic polyps 02/09/2007    TUBULAR ADENOMA  . Family history of malignant neoplasm of gastrointestinal tract     Past Surgical History  Procedure Laterality Date  . Breast lumpectomy      right  . Umbilical hernia repair    . Dilation and curettage of uterus    . Colonoscopy    . Polypectomy      Family History  Problem Relation Age of Onset  . Colon cancer Mother 2  . Kidney cancer Mother   . Heart disease Sister   . Stroke Sister   . Stomach cancer Brother   . Lung cancer Brother   . Clotting disorder Son     History  Substance Use Topics  . Smoking status: Never Smoker   . Smokeless tobacco: Never Used  . Alcohol Use: No    OB History   Grav Para Term Preterm Abortions TAB SAB Ect Mult Living                  Review of Systems  Constitutional:       Per HPI, otherwise negative  HENT:       Per HPI, otherwise negative   Respiratory:       Per HPI, otherwise negative  Cardiovascular:       Per HPI, otherwise negative  Gastrointestinal: Negative for vomiting.  Endocrine: Positive for polyuria.       Negative aside from HPI  Genitourinary:       Neg aside from HPI   Musculoskeletal:       Per HPI, otherwise negative  Skin: Negative.   Neurological: Negative for syncope.    Allergies  Ciprofloxacin; Clindamycin/lincomycin; Codeine; Diclofenac; Fenoprofen calcium; Flexeril; and Sulfa antibiotics  Home Medications   Current Outpatient Rx  Name  Route  Sig  Dispense  Refill  . amoxicillin-clavulanate (AUGMENTIN) 875-125 MG per tablet   Oral   Take 1 tablet by mouth 2 (two) times daily with a meal.         . Calcium Carbonate-Vitamin D (CALCIUM 600 + D PO)   Oral   Take 1 capsule by mouth daily.         . Multiple Vitamins-Minerals (CENTRUM SILVER PO)   Oral   Take 1 tablet by mouth daily.         . nitrofurantoin (MACRODANTIN) 50 MG  capsule   Oral   Take 50 mg by mouth daily.         Marland Kitchen omeprazole (PRILOSEC) 20 MG capsule   Oral   Take 20 mg by mouth daily.         Marland Kitchen perphenazine-amitriptyline (ETRAFON/TRIAVIL) 2-10 MG TABS   Oral   Take 1 tablet by mouth 2 (two) times daily.         . phenazopyridine (PYRIDIUM) 100 MG tablet   Oral   Take 100 mg by mouth 3 (three) times daily as needed (bladder pain).         . pravastatin (PRAVACHOL) 20 MG tablet   Oral   Take 20 mg by mouth daily.           BP 130/78  Pulse 65  Temp(Src) 98.2 F (36.8 C) (Oral)  Resp 16  SpO2 94%  Physical Exam  Nursing note and vitals reviewed. Constitutional: She is oriented to person, place, and time. She appears well-developed and well-nourished. No distress.  HENT:  Head: Normocephalic and atraumatic.  Eyes: Conjunctivae and EOM are normal.  Cardiovascular: Normal rate and regular rhythm.   Pulmonary/Chest: Effort normal and breath sounds normal. No stridor. No respiratory  distress.  Abdominal: She exhibits no distension.  Musculoskeletal: She exhibits no edema.  Neurological: She is alert and oriented to person, place, and time. No cranial nerve deficit.  Skin: Skin is warm and dry.  Psychiatric: She has a normal mood and affect.    ED Course  Procedures (including critical care time)  Labs Reviewed  CBC WITH DIFFERENTIAL  BASIC METABOLIC PANEL  URINALYSIS, ROUTINE W REFLEX MICROSCOPIC   No results found.   No diagnosis found.  O2- 99%ra, normal  On repeat exam the patient has no additional episodes of hematemesis.  She remains in no distress.  MDM  This patient presents after one episode of hematemesis.  On exam the patient is in no distress, with stable vital signs.  Given the patient's disruption of chronic cough, there suspicion of dry mucosa or bronchitic changes.  There is no evidence of pneumonia, and with stable labs, unremarkable chest x-ray, the patient was stable for discharge with PMD followup.  Gerhard Munch, MD 12/10/12 [id number]

## 2013-02-21 ENCOUNTER — Encounter (HOSPITAL_COMMUNITY): Payer: Self-pay | Admitting: Pharmacy Technician

## 2013-02-21 NOTE — Progress Notes (Signed)
Need orders in epic asap pre op is 02-22-13 1100 am 

## 2013-02-22 ENCOUNTER — Encounter (HOSPITAL_COMMUNITY): Payer: Self-pay

## 2013-02-22 ENCOUNTER — Encounter (HOSPITAL_COMMUNITY)
Admission: RE | Admit: 2013-02-22 | Discharge: 2013-02-22 | Disposition: A | Discharge status: Home / Self Care | Payer: Medicare Other | Source: Ambulatory Visit | Attending: Orthopedic Surgery | Admitting: Orthopedic Surgery

## 2013-02-22 HISTORY — DX: Chronic cough: R05.3

## 2013-02-22 HISTORY — DX: Cough: R05

## 2013-02-22 HISTORY — DX: Other specified disorders of bone density and structure, unspecified site: M85.80

## 2013-02-22 HISTORY — DX: Other specified postprocedural states: R11.2

## 2013-02-22 HISTORY — DX: Other specified postprocedural states: Z98.890

## 2013-02-22 HISTORY — DX: Unspecified rotator cuff tear or rupture of unspecified shoulder, not specified as traumatic: M75.100

## 2013-02-22 HISTORY — DX: Unspecified osteoarthritis, unspecified site: M19.90

## 2013-02-22 HISTORY — DX: Unspecified hearing loss, unspecified ear: H91.90

## 2013-02-22 LAB — CBC
HCT: 40.3 % (ref 36.0–46.0)
Hemoglobin: 13.1 g/dL (ref 12.0–15.0)
MCH: 30.7 pg (ref 26.0–34.0)
MCHC: 32.5 g/dL (ref 30.0–36.0)
MCV: 94.4 fL (ref 78.0–100.0)
Platelets: 210 10*3/uL (ref 150–400)
RBC: 4.27 MIL/uL (ref 3.87–5.11)
RDW: 12.6 % (ref 11.5–15.5)
WBC: 5.5 10*3/uL (ref 4.0–10.5)

## 2013-02-22 LAB — SURGICAL PCR SCREEN
MRSA, PCR: NEGATIVE
Staphylococcus aureus: NEGATIVE

## 2013-02-22 NOTE — Patient Instructions (Signed)
Natalie West  02/22/2013                           YOUR PROCEDURE IS SCHEDULED ON: 02/28/13               PLEASE REPORT TO SHORT STAY CENTER AT :  7:30 AM               CALL THIS NUMBER IF ANY PROBLEMS THE DAY OF SURGERY :               [phone]                      REMEMBER:   Do not eat food or drink liquids AFTER MIDNIGHT   Take these medicines the morning of surgery with A SIP OF WATER:  OMEPRAZOLE    Do not wear jewelry, make-up   Do not wear lotions, powders, or perfumes.   Do not shave legs or underarms 12 hrs. before surgery (men may shave face)  Do not bring valuables to the hospital.  Contacts, dentures or bridgework may not be worn into surgery.  Leave suitcase in the car. After surgery it may be brought to your room.  For patients admitted to the hospital more than one night, checkout time is 11:00                          The day of discharge.   Patients discharged the day of surgery will not be allowed to drive home                             If going home same day of surgery, must have someone stay with you first                           24 hrs at home and arrange for some one to drive you home from hospital.    Special Instructions:   Please read over the following fact sheets that you were given:               1. MRSA  INFORMATION                      2. [facility] PREPARING FOR SURGERY SHEET                                                X_____________________________________________________________________        Failure to follow these instructions may result in cancellation of your surgery

## 2013-02-27 NOTE — H&P (Signed)
Natalie West is an 79 y.o. female.   Chief Complaint: right shoulder pain HPI: The patient is a 79 year old female who presented to Dr. Jeannetta Ellis office with right shoulder pain. She did not recall an injury but had pain for about 7 months. She had been seeing her PCP who had sent her to PT. She had not seen much improvement of her symptoms since starting PT. MRI was ordered of her right shoulder which showed a right shoulder rotator cuff tear with retraction.   Past Medical History  Diagnosis Date  . Hypercholesterolemia   . Esophageal reflux   . Depression   . Anxiety   . Chronic UTI   . Breast cancer   . Esophageal stricture   . Personal history of colonic polyps 02/09/2007    TUBULAR ADENOMA  . Family history of malignant neoplasm of gastrointestinal tract   . PONV (postoperative nausea and vomiting)   . Chronic cough   . Rotator cuff tear     RT  . Osteopenia   . Arthritis   . Back pain, chronic     WITH MOTION  . Hearing loss     Past Surgical History  Procedure Laterality Date  . Breast lumpectomy      right  . Umbilical hernia repair    . Dilation and curettage of uterus    . Colonoscopy    . Polypectomy      Family History  Problem Relation Age of Onset  . Colon cancer Mother 82  . Kidney cancer Mother   . Heart disease Sister   . Stroke Sister   . Stomach cancer Brother   . Lung cancer Brother   . Clotting disorder Son    Social History:  reports that she has never smoked. She has never used smokeless tobacco. She reports that she does not drink alcohol or use illicit drugs.  Allergies:  Allergies  Allergen Reactions  . Ciprofloxacin     unknown  . Clindamycin/Lincomycin     unknown  . Codeine Nausea And Vomiting  . Diclofenac     unknown  . Fenoprofen Calcium     unknown  . Flexeril (Cyclobenzaprine Hcl)     unknown  . Noroxin (Norfloxacin)     unknown  . Relafen (Nabumetone)     unknown  . Sulfa Antibiotics     unknown    . Current  outpatient prescriptions:Calcium Carbonate-Vitamin D (CALCIUM 600 + D PO), Take 1 capsule by mouth daily., Disp: , Rfl: ;  Multiple Vitamins-Minerals (CENTRUM SILVER PO), Take 1 tablet by mouth daily., Disp: , Rfl: ;  omeprazole (PRILOSEC) 20 MG capsule, Take 20 mg by mouth daily., Disp: , Rfl: ;  perphenazine-amitriptyline (ETRAFON/TRIAVIL) 2-10 MG TABS, Take 1 tablet by mouth 2 (two) times daily., Disp: , Rfl:  pravastatin (PRAVACHOL) 20 MG tablet, Take 20 mg by mouth every evening. , Disp: , Rfl: ;  trimethoprim (TRIMPEX) 100 MG tablet, Take 100 mg by mouth daily., Disp: , Rfl:   Review of Systems  Constitutional: Negative.   HENT: Positive for hearing loss and tinnitus. Negative for ear pain, nosebleeds, congestion, sore throat, neck pain and ear discharge.   Eyes: Negative.   Respiratory: Negative.  Negative for stridor.   Cardiovascular: Negative.   Gastrointestinal: Negative.   Genitourinary: Negative.   Musculoskeletal: Positive for joint pain. Negative for myalgias, back pain and falls.       Right shoulder pain  Skin: Negative.   Neurological:  Positive for tingling. Negative for dizziness, tremors, sensory change, speech change, focal weakness, seizures, loss of consciousness and headaches.  Endo/Heme/Allergies: Negative.   Psychiatric/Behavioral: Positive for depression. Negative for suicidal ideas, hallucinations, memory loss and substance abuse. The patient is nervous/anxious. The patient does not have insomnia.      Vitals Weight: 156 lb Height: 62 in Body Surface Area: 1.76 m Body Mass Index: 28.53 kg/m Pulse: 63 (Regular) BP: 147/83 (Sitting, Left Arm, Standard)  Physical Exam  Constitutional: She is oriented to person, place, and time. She appears well-developed and well-nourished. No distress.  HENT:  Head: Normocephalic and atraumatic.  Right Ear: External ear normal.  Left Ear: External ear normal.  Nose: Nose normal.  Mouth/Throat: Oropharynx is  clear and moist.  Eyes: Conjunctivae and EOM are normal.  Neck: Normal range of motion. Neck supple. No tracheal deviation present. No thyromegaly present.  Cardiovascular: Normal rate, regular rhythm, normal heart sounds and intact distal pulses.   No murmur heard. Respiratory: Effort normal and breath sounds normal. No respiratory distress. She has no wheezes. She exhibits no tenderness.  GI: Soft. Bowel sounds are normal. She exhibits no distension and no mass. There is no tenderness.  Musculoskeletal:       Right shoulder: She exhibits decreased range of motion, tenderness, pain and decreased strength.       Left shoulder: Normal.       Right elbow: Normal.      Left elbow: Normal.       Right wrist: Normal.       Left wrist: Normal.  Lymphadenopathy:    She has no cervical adenopathy.  Neurological: She is alert and oriented to person, place, and time. She has normal reflexes. No cranial nerve deficit or sensory deficit.  Skin: No rash noted. She is not diaphoretic. No erythema.  Psychiatric: She has a normal mood and affect. Her behavior is normal.     Assessment/Plan Right shoulder, rotator cuff tear with retraction She's going to need to have an open repair, partial acromionectomy repair, possible graft, possible anchors. We may or may not need to use a graft material that is made from calf skin. This is approved by the FDA and I have not had a rejection of that graft yet. Also, we may need to use anchors. These are polyethylene anchors that stay in the bone and we use those anchors to suture the tendon down in certain cases where the tendon is completely pulled off the bone. There is always a chance of a secondary infection obviously with any surgery but we do use antibiotics preop. Risks and benefits of the surgery discussed with the patient.   [name] LAUREN 02/27/2013, 8:33 PM

## 2013-02-28 ENCOUNTER — Encounter (HOSPITAL_COMMUNITY): Payer: Self-pay | Admitting: Anesthesiology

## 2013-02-28 ENCOUNTER — Encounter (HOSPITAL_COMMUNITY)
Admission: RE | Disposition: A | Discharge status: Home / Self Care | Payer: Self-pay | Source: Ambulatory Visit | Attending: Orthopedic Surgery

## 2013-02-28 ENCOUNTER — Ambulatory Visit (HOSPITAL_COMMUNITY): Payer: Medicare Other | Admitting: Anesthesiology

## 2013-02-28 ENCOUNTER — Encounter (HOSPITAL_COMMUNITY): Payer: Self-pay | Admitting: *Deleted

## 2013-02-28 ENCOUNTER — Observation Stay (HOSPITAL_COMMUNITY)
Admission: RE | Admit: 2013-02-28 | Discharge: 2013-03-01 | Disposition: A | Discharge status: Home / Self Care | Payer: Medicare Other | Source: Ambulatory Visit | Attending: Orthopedic Surgery | Admitting: Orthopedic Surgery

## 2013-02-28 DIAGNOSIS — Z79899 Other long term (current) drug therapy: Secondary | ICD-10-CM | POA: Insufficient documentation

## 2013-02-28 DIAGNOSIS — S43429A Sprain of unspecified rotator cuff capsule, initial encounter: Secondary | ICD-10-CM

## 2013-02-28 DIAGNOSIS — F411 Generalized anxiety disorder: Secondary | ICD-10-CM | POA: Insufficient documentation

## 2013-02-28 DIAGNOSIS — M719 Bursopathy, unspecified: Secondary | ICD-10-CM | POA: Insufficient documentation

## 2013-02-28 DIAGNOSIS — E7800 Pure hypercholesterolemia, unspecified: Secondary | ICD-10-CM | POA: Insufficient documentation

## 2013-02-28 DIAGNOSIS — Z9889 Other specified postprocedural states: Secondary | ICD-10-CM

## 2013-02-28 DIAGNOSIS — Z01812 Encounter for preprocedural laboratory examination: Secondary | ICD-10-CM | POA: Insufficient documentation

## 2013-02-28 DIAGNOSIS — F3289 Other specified depressive episodes: Secondary | ICD-10-CM | POA: Insufficient documentation

## 2013-02-28 DIAGNOSIS — M25819 Other specified joint disorders, unspecified shoulder: Principal | ICD-10-CM | POA: Insufficient documentation

## 2013-02-28 DIAGNOSIS — K219 Gastro-esophageal reflux disease without esophagitis: Secondary | ICD-10-CM | POA: Insufficient documentation

## 2013-02-28 DIAGNOSIS — F329 Major depressive disorder, single episode, unspecified: Secondary | ICD-10-CM | POA: Insufficient documentation

## 2013-02-28 DIAGNOSIS — M67919 Unspecified disorder of synovium and tendon, unspecified shoulder: Secondary | ICD-10-CM | POA: Insufficient documentation

## 2013-02-28 HISTORY — PX: SHOULDER OPEN ROTATOR CUFF REPAIR: SHX2407

## 2013-02-28 LAB — COMPREHENSIVE METABOLIC PANEL
ALT: 11 U/L (ref 0–35)
AST: 21 U/L (ref 0–37)
Albumin: 3.8 g/dL (ref 3.5–5.2)
Alkaline Phosphatase: 93 U/L (ref 39–117)
BUN: 13 mg/dL (ref 6–23)
CO2: 26 mEq/L (ref 19–32)
Calcium: 9.5 mg/dL (ref 8.4–10.5)
Chloride: 105 mEq/L (ref 96–112)
Creatinine, Ser: 0.87 mg/dL (ref 0.50–1.10)
GFR calc Af Amer: 72 mL/min — ABNORMAL LOW (ref >90)
GFR calc non Af Amer: 62 mL/min — ABNORMAL LOW (ref >90)
Glucose, Bld: 91 mg/dL (ref 70–99)
Potassium: 3.6 mEq/L (ref 3.5–5.1)
Sodium: 140 mEq/L (ref 135–145)
Total Bilirubin: 0.4 mg/dL (ref 0.3–1.2)
Total Protein: 6.7 g/dL (ref 6.0–8.3)

## 2013-02-28 LAB — URINALYSIS, ROUTINE W REFLEX MICROSCOPIC
Bilirubin Urine: NEGATIVE
Glucose, UA: NEGATIVE mg/dL
Hgb urine dipstick: NEGATIVE
Ketones, ur: NEGATIVE mg/dL
Nitrite: NEGATIVE
Protein, ur: NEGATIVE mg/dL
Specific Gravity, Urine: 1.016 (ref 1.005–1.030)
Urobilinogen, UA: 0.2 mg/dL (ref 0.0–1.0)
pH: 5.5 (ref 5.0–8.0)

## 2013-02-28 LAB — PROTIME-INR
INR: 0.98 (ref 0.00–1.49)
Prothrombin Time: 12.9 seconds (ref 11.6–15.2)

## 2013-02-28 LAB — APTT: aPTT: 36 seconds (ref 24–37)

## 2013-02-28 LAB — URINE MICROSCOPIC-ADD ON

## 2013-02-28 SURGERY — REPAIR, ROTATOR CUFF, OPEN
Anesthesia: General | Site: Shoulder | Laterality: Right | Wound class: Clean

## 2013-02-28 MED ORDER — ACETAMINOPHEN 10 MG/ML IV SOLN: 1000.0000 mg | Freq: Once | INTRAVENOUS | Status: DC | PRN

## 2013-02-28 MED ORDER — FENTANYL CITRATE 0.05 MG/ML IJ SOLN
INTRAMUSCULAR | Status: AC
  Filled 2013-02-28: qty 2

## 2013-02-28 MED ORDER — BUPIVACAINE LIPOSOME 1.3 % IJ SUSP
20.0000 mL | Freq: Once | INTRAMUSCULAR | Status: DC
  Filled 2013-02-28: qty 20

## 2013-02-28 MED ORDER — MEPERIDINE HCL 50 MG/ML IJ SOLN: 6.2500 mg | INTRAMUSCULAR | Status: DC | PRN

## 2013-02-28 MED ORDER — ACETAMINOPHEN 10 MG/ML IV SOLN
INTRAVENOUS | Status: DC | PRN
  Administered 2013-02-28: 1000 mg via INTRAVENOUS

## 2013-02-28 MED ORDER — ONDANSETRON HCL 4 MG PO TABS: 4.0000 mg | ORAL_TABLET | Freq: Four times a day (QID) | ORAL | Status: DC | PRN

## 2013-02-28 MED ORDER — PROMETHAZINE HCL 25 MG/ML IJ SOLN
12.5000 mg | Freq: Four times a day (QID) | INTRAMUSCULAR | Status: DC | PRN
  Administered 2013-02-28 – 2013-03-01 (×2): 12.5 mg via INTRAVENOUS
  Filled 2013-02-28 (×2): qty 1

## 2013-02-28 MED ORDER — THROMBIN 5000 UNITS EX SOLR
CUTANEOUS | Status: AC
  Filled 2013-02-28: qty 5000

## 2013-02-28 MED ORDER — HYDROMORPHONE HCL PF 1 MG/ML IJ SOLN
0.5000 mg | INTRAMUSCULAR | Status: DC | PRN
  Administered 2013-02-28: 0.5 mg via INTRAVENOUS
  Filled 2013-02-28: qty 1

## 2013-02-28 MED ORDER — ACETAMINOPHEN 325 MG PO TABS
650.0000 mg | ORAL_TABLET | Freq: Four times a day (QID) | ORAL | Status: DC | PRN
  Administered 2013-02-28 – 2013-03-01 (×2): 650 mg via ORAL
  Filled 2013-02-28 (×2): qty 2

## 2013-02-28 MED ORDER — LIDOCAINE HCL (CARDIAC) 20 MG/ML IV SOLN
INTRAVENOUS | Status: DC | PRN
  Administered 2013-02-28: 30 mg via INTRAVENOUS

## 2013-02-28 MED ORDER — LACTATED RINGERS IV SOLN
INTRAVENOUS | Status: DC
  Administered 2013-02-28: 100 mL/h via INTRAVENOUS
  Administered 2013-03-01: via INTRAVENOUS

## 2013-02-28 MED ORDER — FENTANYL CITRATE 0.05 MG/ML IJ SOLN
INTRAMUSCULAR | Status: DC | PRN
  Administered 2013-02-28 (×2): 25 ug via INTRAVENOUS
  Administered 2013-02-28 (×2): 50 ug via INTRAVENOUS

## 2013-02-28 MED ORDER — SIMVASTATIN 5 MG PO TABS
5.0000 mg | ORAL_TABLET | Freq: Every day | ORAL | Status: DC
  Administered 2013-02-28: 5 mg via ORAL
  Filled 2013-02-28 (×2): qty 1

## 2013-02-28 MED ORDER — MENTHOL 3 MG MT LOZG: 1.0000 | LOZENGE | OROMUCOSAL | Status: DC | PRN

## 2013-02-28 MED ORDER — THROMBIN 5000 UNITS EX SOLR
CUTANEOUS | Status: DC | PRN
  Administered 2013-02-28: 5000 [IU] via TOPICAL

## 2013-02-28 MED ORDER — TRIMETHOPRIM 100 MG PO TABS
100.0000 mg | ORAL_TABLET | Freq: Every day | ORAL | Status: DC
Start: 2013-02-28 — End: 2013-03-01
  Administered 2013-02-28: 100 mg via ORAL
  Filled 2013-02-28 (×2): qty 1

## 2013-02-28 MED ORDER — PHENOL 1.4 % MT LIQD: 1.0000 | OROMUCOSAL | Status: DC | PRN

## 2013-02-28 MED ORDER — HYDROMORPHONE HCL 2 MG PO TABS
2.0000 mg | ORAL_TABLET | ORAL | Status: DC | PRN
  Administered 2013-02-28 – 2013-03-01 (×2): 2 mg via ORAL
  Filled 2013-02-28 (×2): qty 1

## 2013-02-28 MED ORDER — ACETAMINOPHEN 10 MG/ML IV SOLN
INTRAVENOUS | Status: AC
  Filled 2013-02-28: qty 100

## 2013-02-28 MED ORDER — PROMETHAZINE HCL 25 MG/ML IJ SOLN: 6.2500 mg | INTRAMUSCULAR | Status: DC | PRN

## 2013-02-28 MED ORDER — SODIUM CHLORIDE 0.9 % IR SOLN
Status: DC | PRN
  Administered 2013-02-28: 11:00:00

## 2013-02-28 MED ORDER — FENTANYL CITRATE 0.05 MG/ML IJ SOLN
25.0000 ug | INTRAMUSCULAR | Status: DC | PRN
  Administered 2013-02-28: 25 ug via INTRAVENOUS
  Administered 2013-02-28: 50 ug via INTRAVENOUS
  Administered 2013-02-28: 25 ug via INTRAVENOUS

## 2013-02-28 MED ORDER — CEFAZOLIN SODIUM 1-5 GM-% IV SOLN
1.0000 g | Freq: Four times a day (QID) | INTRAVENOUS | Status: AC
  Administered 2013-02-28 – 2013-03-01 (×3): 1 g via INTRAVENOUS
  Filled 2013-02-28 (×3): qty 50

## 2013-02-28 MED ORDER — ONDANSETRON HCL 4 MG/2ML IJ SOLN
INTRAMUSCULAR | Status: DC | PRN
  Administered 2013-02-28 (×2): 2 mg via INTRAVENOUS

## 2013-02-28 MED ORDER — LACTATED RINGERS IV SOLN
INTRAVENOUS | Status: DC | PRN
  Administered 2013-02-28: 11:00:00 via INTRAVENOUS

## 2013-02-28 MED ORDER — POLYETHYLENE GLYCOL 3350 17 G PO PACK: 17.0000 g | PACK | Freq: Every day | ORAL | Status: DC | PRN

## 2013-02-28 MED ORDER — FLEET ENEMA 7-19 GM/118ML RE ENEM: 1.0000 | ENEMA | Freq: Once | RECTAL | Status: AC | PRN

## 2013-02-28 MED ORDER — GLYCOPYRROLATE 0.2 MG/ML IJ SOLN
INTRAMUSCULAR | Status: DC | PRN
  Administered 2013-02-28: 0.2 mg via INTRAVENOUS

## 2013-02-28 MED ORDER — ACETAMINOPHEN 650 MG RE SUPP: 650.0000 mg | Freq: Four times a day (QID) | RECTAL | Status: DC | PRN

## 2013-02-28 MED ORDER — CHLORHEXIDINE GLUCONATE 4 % EX LIQD: 60.0000 mL | Freq: Once | CUTANEOUS | Status: DC

## 2013-02-28 MED ORDER — ONDANSETRON HCL 4 MG/2ML IJ SOLN
4.0000 mg | Freq: Four times a day (QID) | INTRAMUSCULAR | Status: DC | PRN
  Administered 2013-02-28: 4 mg via INTRAVENOUS
  Filled 2013-02-28: qty 2

## 2013-02-28 MED ORDER — SUCCINYLCHOLINE CHLORIDE 20 MG/ML IJ SOLN
INTRAMUSCULAR | Status: DC | PRN
  Administered 2013-02-28: 100 mg via INTRAVENOUS

## 2013-02-28 MED ORDER — LACTATED RINGERS IV SOLN
INTRAVENOUS | Status: DC
  Administered 2013-02-28: 13:00:00 via INTRAVENOUS
  Administered 2013-02-28: 1000 mL via INTRAVENOUS

## 2013-02-28 MED ORDER — DEXAMETHASONE SODIUM PHOSPHATE 4 MG/ML IJ SOLN
INTRAMUSCULAR | Status: DC | PRN
  Administered 2013-02-28: 4 mg via INTRAVENOUS

## 2013-02-28 MED ORDER — PANTOPRAZOLE SODIUM 40 MG PO TBEC
40.0000 mg | DELAYED_RELEASE_TABLET | Freq: Every day | ORAL | Status: DC
  Administered 2013-03-01: 40 mg via ORAL
  Filled 2013-02-28: qty 1

## 2013-02-28 MED ORDER — BUPIVACAINE LIPOSOME 1.3 % IJ SUSP
INTRAMUSCULAR | Status: DC | PRN
  Administered 2013-02-28: 20 mL

## 2013-02-28 MED ORDER — METHOCARBAMOL 500 MG PO TABS
500.0000 mg | ORAL_TABLET | Freq: Four times a day (QID) | ORAL | Status: DC | PRN
  Administered 2013-02-28 – 2013-03-01 (×3): 500 mg via ORAL
  Filled 2013-02-28 (×3): qty 1

## 2013-02-28 MED ORDER — CEFAZOLIN SODIUM-DEXTROSE 2-3 GM-% IV SOLR
INTRAVENOUS | Status: AC
  Filled 2013-02-28: qty 50

## 2013-02-28 MED ORDER — BISACODYL 10 MG RE SUPP: 10.0000 mg | Freq: Every day | RECTAL | Status: DC | PRN

## 2013-02-28 MED ORDER — EPHEDRINE SULFATE 50 MG/ML IJ SOLN
INTRAMUSCULAR | Status: DC | PRN
  Administered 2013-02-28: 10 mg via INTRAVENOUS

## 2013-02-28 MED ORDER — PROPOFOL 10 MG/ML IV EMUL
INTRAVENOUS | Status: DC | PRN
  Administered 2013-02-28: 120 mg via INTRAVENOUS

## 2013-02-28 MED ORDER — CEFAZOLIN SODIUM-DEXTROSE 2-3 GM-% IV SOLR
2.0000 g | INTRAVENOUS | Status: AC
  Administered 2013-02-28: 2 g via INTRAVENOUS

## 2013-02-28 MED ORDER — METHOCARBAMOL 100 MG/ML IJ SOLN
500.0000 mg | Freq: Four times a day (QID) | INTRAVENOUS | Status: DC | PRN
  Administered 2013-03-01: 500 mg via INTRAVENOUS
  Filled 2013-02-28 (×2): qty 5

## 2013-02-28 SURGICAL SUPPLY — 52 items
ANCH SUT 2 5.5 BABSR ASCP (Orthopedic Implant) ×1 IMPLANT
ANCHOR PEEK ZIP 5.5 NDL NO2 (Orthopedic Implant) ×2 IMPLANT
BAG SPEC THK2 15X12 ZIP CLS (MISCELLANEOUS) ×1
BAG ZIPLOCK 12X15 (MISCELLANEOUS) ×2 IMPLANT
BLADE OSCILLATING/SAGITTAL (BLADE) ×1
BLADE SW THK.38XMED LNG THN (BLADE) ×1 IMPLANT
BNDG COHESIVE 6X5 TAN NS LF (GAUZE/BANDAGES/DRESSINGS) ×2 IMPLANT
BUR OVAL CARBIDE 4.0 (BURR) ×2 IMPLANT
CLEANER TIP ELECTROSURG 2X2 (MISCELLANEOUS) ×2 IMPLANT
CLOSURE STERI-STRIP 1/4X4 (GAUZE/BANDAGES/DRESSINGS) ×2 IMPLANT
CLOTH BEACON ORANGE TIMEOUT ST (SAFETY) ×2 IMPLANT
DRAPE POUCH INSTRU U-SHP 10X18 (DRAPES) ×2 IMPLANT
DRSG EMULSION OIL 3X3 NADH (GAUZE/BANDAGES/DRESSINGS) ×2 IMPLANT
DRSG PAD ABDOMINAL 8X10 ST (GAUZE/BANDAGES/DRESSINGS) ×2 IMPLANT
DURAPREP 26ML APPLICATOR (WOUND CARE) ×2 IMPLANT
ELECT REM PT RETURN 9FT ADLT (ELECTROSURGICAL) ×2
ELECTRODE REM PT RTRN 9FT ADLT (ELECTROSURGICAL) ×1 IMPLANT
GLOVE BIO SURGEON STRL SZ 6.5 (GLOVE) ×6 IMPLANT
GLOVE BIOGEL PI IND STRL 6.5 (GLOVE) ×1 IMPLANT
GLOVE BIOGEL PI IND STRL 8 (GLOVE) ×1 IMPLANT
GLOVE BIOGEL PI INDICATOR 6.5 (GLOVE) ×1
GLOVE BIOGEL PI INDICATOR 8 (GLOVE) ×1
GLOVE ECLIPSE 8.0 STRL XLNG CF (GLOVE) ×4 IMPLANT
GLOVE INDICATOR 6.5 STRL GRN (GLOVE) ×2 IMPLANT
GLOVE SURG SS PI 6.5 STRL IVOR (GLOVE) ×6 IMPLANT
GOWN PREVENTION PLUS LG XLONG (DISPOSABLE) ×4 IMPLANT
KIT BASIN OR (CUSTOM PROCEDURE TRAY) ×2 IMPLANT
MANIFOLD NEPTUNE II (INSTRUMENTS) ×2 IMPLANT
NEEDLE MA TROC 1/2 (NEEDLE) IMPLANT
NS IRRIG 1000ML POUR BTL (IV SOLUTION) IMPLANT
PACK SHOULDER CUSTOM OPM052 (CUSTOM PROCEDURE TRAY) ×2 IMPLANT
PASSER SUT SWANSON 36MM LOOP (INSTRUMENTS) IMPLANT
PATCH TISSUE MEND 3X3CM (Orthopedic Implant) ×2 IMPLANT
POSITIONER SURGICAL ARM (MISCELLANEOUS) ×2 IMPLANT
SLING ARM IMMOBILIZER MED (SOFTGOODS) ×2 IMPLANT
SPONGE GAUZE 4X4 12PLY (GAUZE/BANDAGES/DRESSINGS) ×2 IMPLANT
SPONGE LAP 4X18 X RAY DECT (DISPOSABLE) ×2 IMPLANT
SPONGE SURGIFOAM ABS GEL 100 (HEMOSTASIS) ×2 IMPLANT
STAPLER VISISTAT 35W (STAPLE) IMPLANT
STRIP CLOSURE SKIN 1/2X4 (GAUZE/BANDAGES/DRESSINGS) ×2 IMPLANT
SUCTION FRAZIER 12FR DISP (SUCTIONS) ×2 IMPLANT
SUT BONE WAX W31G (SUTURE) ×2 IMPLANT
SUT ETHIBOND NAB CT1 #1 30IN (SUTURE) ×4 IMPLANT
SUT MNCRL AB 4-0 PS2 18 (SUTURE) ×2 IMPLANT
SUT VIC AB 0 CT1 27 (SUTURE) ×2
SUT VIC AB 0 CT1 27XBRD ANTBC (SUTURE) ×1 IMPLANT
SUT VIC AB 1 CT1 27 (SUTURE) ×4
SUT VIC AB 1 CT1 27XBRD ANTBC (SUTURE) ×2 IMPLANT
SUT VIC AB 2-0 CT1 27 (SUTURE)
SUT VIC AB 2-0 CT1 27XBRD (SUTURE) IMPLANT
TAPE CLOTH SURG 6X10 WHT LF (GAUZE/BANDAGES/DRESSINGS) ×2 IMPLANT
TOWEL OR 17X26 10 PK STRL BLUE (TOWEL DISPOSABLE) ×4 IMPLANT

## 2013-02-28 NOTE — Anesthesia Postprocedure Evaluation (Signed)
Anesthesia Post Note  Patient: Natalie West  Procedure(s) Performed: Procedure(s) (LRB): RIGHT ROTATOR CUFF REPAIR SHOULDER OPEN WITH POSSIBLE GRAFT AND ANCHORS (Right)  Anesthesia type: General  Patient location: PACU  Post pain: Pain level controlled  Post assessment: Post-op Vital signs reviewed  Last Vitals: BP 156/85  Pulse 72  Temp(Src) 36.4 C (Oral)  Resp 14  Ht 5\' 2"  (1.575 m)  Wt 156 lb (70.761 kg)  BMI 28.53 kg/m2  SpO2 96%  Post vital signs: Reviewed  Level of consciousness: sedated  Complications: No apparent anesthesia complications

## 2013-02-28 NOTE — Anesthesia Preprocedure Evaluation (Addendum)
Anesthesia Evaluation  Patient identified by MRN, date of birth, ID band Patient awake    Reviewed: Allergy & Precautions, H&P , NPO status , Patient's Chart, lab work & pertinent test results  History of Anesthesia Complications (+) PONV  Airway Mallampati: III TM Distance: >3 FB   Mouth opening: Limited Mouth Opening  Dental  (+) Dental Advisory Given   Pulmonary neg pulmonary ROS,  breath sounds clear to auscultation        Cardiovascular negative cardio ROS  Rhythm:Regular Rate:Normal     Neuro/Psych PSYCHIATRIC DISORDERS Anxiety Depression negative neurological ROS     GI/Hepatic Neg liver ROS, GERD-  Medicated,  Endo/Other  negative endocrine ROS  Renal/GU negative Renal ROS     Musculoskeletal negative musculoskeletal ROS (+)   Abdominal   Peds  Hematology negative hematology ROS (+)   Anesthesia Other Findings   Reproductive/Obstetrics negative OB ROS                         Anesthesia Physical Anesthesia Plan  ASA: II  Anesthesia Plan: General   Post-op Pain Management:    Induction: Intravenous  Airway Management Planned: Oral ETT  Additional Equipment:   Intra-op Plan:   Post-operative Plan: Extubation in OR  Informed Consent: I have reviewed the patients History and Physical, chart, labs and discussed the procedure including the risks, benefits and alternatives for the proposed anesthesia with the patient or authorized representative who has indicated his/her understanding and acceptance.   Dental advisory given  Plan Discussed with: CRNA  Anesthesia Plan Comments:         Anesthesia Quick Evaluation

## 2013-02-28 NOTE — Brief Op Note (Signed)
,  02/28/2013  12:00 PM  PATIENT:  Natalie West  79 y.o. female  PRE-OPERATIVE DIAGNOSIS:  RIGHT SHOULDER ROTATOR CUFF TEAR,COMPLEX and Retracted.  POST-OPERATIVE DIAGNOSIS:  RIGHT SHOULDER ROTATOR CUFF TEAR,COMPLEX and Retracted.  PROCEDURE:  Procedure(s): RIGHT ROTATOR CUFF REPAIR SHOULDER OPEN WITH POSSIBLE GRAFT AND ANCHORS (Right).Open Acromionectomy and Repair Of COMPLEX,RETRACTED Rotator Cuff and Tissue Mend Graft with one anchor.  SURGEON:  Surgeon(s) and Role:    * Jacki Cones, MD - Primary  PHYSICIAN ASSISTANT: Dimitri Ped PA  ASSISTANTS: Dimitri Ped PA  ANESTHESIA:   general  EBL:     BLOOD ADMINISTERED:none  DRAINS: none   LOCAL MEDICATIONS USED:  BUPIVICAINE 20cc  SPECIMEN:  No Specimen  DISPOSITION OF SPECIMEN:  N/A  COUNTS:  YES  TOURNIQUET:  * No tourniquets in log *  DICTATION: .Other Dictation: Dictation Number [id number]  PLAN OF CARE: Admit for overnight observation  PATIENT DISPOSITION:  Stable in OR   Delay start of Pharmacological VTE agent (>24hrs) due to surgical blood loss or risk of bleeding: yes

## 2013-02-28 NOTE — H&P (View-Only) (Signed)
Need orders in epic asap pre op is 02-22-13 1100 am

## 2013-02-28 NOTE — Interval H&P Note (Signed)
History and Physical Interval Note:  02/28/2013 10:18 AM  Natalie West  has presented today for surgery, with the diagnosis of RIGHT SHOULDER ROTATOR CUFF TEAR  The various methods of treatment have been discussed with the patient and family. After consideration of risks, benefits and other options for treatment, the patient has consented to  Procedure(s): RIGHT ROTATOR CUFF REPAIR SHOULDER OPEN WITH POSSIBLE GRAFT AND ANCHORS (Right) as a surgical intervention .  The patient's history has been reviewed, patient examined, no change in status, stable for surgery.  I have reviewed the patient's chart and labs.  Questions were answered to the patient's satisfaction.     Rollen Selders A

## 2013-02-28 NOTE — Transfer of Care (Signed)
Immediate Anesthesia Transfer of Care Note  Patient: Natalie West  Procedure(s) Performed: Procedure(s): RIGHT ROTATOR CUFF REPAIR SHOULDER OPEN WITH POSSIBLE GRAFT AND ANCHORS (Right)  Patient Location: PACU  Anesthesia Type:General  Level of Consciousness: sedated and patient cooperative  Airway & Oxygen Therapy: Patient Spontanous Breathing and Patient connected to face mask oxygen  Post-op Assessment: Report given to PACU RN and Post -op Vital signs reviewed and stable  Post vital signs: stable  Complications: No apparent anesthesia complications

## 2013-03-01 ENCOUNTER — Encounter (HOSPITAL_COMMUNITY): Payer: Self-pay | Admitting: Orthopedic Surgery

## 2013-03-01 MED ORDER — HYDROMORPHONE HCL 2 MG PO TABS: 2.0000 mg | ORAL_TABLET | ORAL | Status: DC | PRN

## 2013-03-01 MED ORDER — METHOCARBAMOL 500 MG PO TABS: 500.0000 mg | ORAL_TABLET | Freq: Four times a day (QID) | ORAL | Status: DC | PRN

## 2013-03-01 MED ORDER — TRAMADOL HCL 50 MG PO TABS: 50.0000 mg | ORAL_TABLET | Freq: Four times a day (QID) | ORAL | Status: DC | PRN

## 2013-03-01 NOTE — Progress Notes (Signed)
   Subjective: 1 Day Post-Op Procedure(s) (LRB): RIGHT ROTATOR CUFF REPAIR SHOULDER OPEN WITH POSSIBLE GRAFT AND ANCHORS (Right) Patient reports pain as mild.   Patient seen in rounds without Dr. [name]. Patient is well, and has had no acute complaints or problems.  She reports that she was able to get some rest last night. No issues overnight. Post op nausea relieved.  Plan is to go Home after hospital stay.  Objective: Vital signs in last 24 hours: Temp:  [97.6 F (36.4 C)-98.8 F (37.1 C)] 98.8 F (37.1 C) (04/30 0500) Pulse Rate:  [62-82] 62 (04/30 0500) Resp:  [10-16] 16 (04/30 0500) BP: (123-162)/(66-92) 123/66 mmHg (04/30 0500) SpO2:  [96 %-100 %] 97 % (04/30 0500) Weight:  [70.761 kg (156 lb)] 70.761 kg (156 lb) (04/29 1350)  Intake/Output from previous day:  Intake/Output Summary (Last 24 hours) at 03/01/13 0740 Last data filed at 03/01/13 0501  Gross per 24 hour  Intake 2810.01 ml  Output    342 ml  Net 2468.01 ml     Recent Labs  02/28/13 0802  NA 140  K 3.6  CL 105  CO2 26  BUN 13  CREATININE 0.87  GLUCOSE 91  CALCIUM 9.5    Recent Labs  02/28/13 0802  INR 0.98    EXAM General - Patient is Alert and Oriented Extremity - Neurologically intact Neurovascular intact Dressing/Incision - clean, dry Motor Function - intact, moving fingers and wrist well on exam  Past Medical History  Diagnosis Date  . Hypercholesterolemia   . Esophageal reflux   . Depression   . Anxiety   . Chronic UTI   . Breast cancer   . Esophageal stricture   . Personal history of colonic polyps 02/09/2007    TUBULAR ADENOMA  . Family history of malignant neoplasm of gastrointestinal tract   . PONV (postoperative nausea and vomiting)   . Chronic cough   . Rotator cuff tear     RT  . Osteopenia   . Arthritis   . Back pain, chronic     WITH MOTION  . Hearing loss     Assessment/Plan: 1 Day Post-Op Procedure(s) (LRB): RIGHT ROTATOR CUFF REPAIR SHOULDER OPEN WITH  POSSIBLE GRAFT AND ANCHORS (Right) Active Problems:   Rotator cuff (capsule) sprain  Estimated body mass index is 28.53 kg/(m^2) as calculated from the following:   Height as of this encounter: 5\' 2"  (1.575 m).   Weight as of this encounter: 70.761 kg (156 lb). Advance diet Up with therapy D/C IV fluids Discharge home   Wear sling at all times. Discharge instructions given. Follow up in office in 10 days.   Natalie West 03/01/2013, 7:40 AM

## 2013-03-01 NOTE — Op Note (Signed)
NAMEVARETTA, CHAVERS NO.:  [id number]  MEDICAL RECORD NO.:  [id number]  LOCATION:  1621                         FACILITY:  [facility]  PHYSICIAN:  Georges Lynch. Juno Alers, M.D.DATE OF BIRTH:  October 23, 1931  DATE OF PROCEDURE:  02/28/2013 DATE OF DISCHARGE:                              OPERATIVE REPORT   SURGEON:  Georges Lynch. [name], M.D.  ASSISTANT:  Dimitri Ped, P.A.  PREOPERATIVE DIAGNOSIS: 1. Severe impingement syndrome, right shoulder. 2. Complex retracted tear of the right rotator cuff tendon.  POSTOPERATIVE DIAGNOSIS: 1. Severe impingement syndrome, right shoulder. 2. Complex retracted tear of the right rotator cuff tendon.  OPERATION: 1. Open acromionectomy and acromioplasty, right shoulder. 2. Repair of a complex retracted tear of the right rotator cuff. 3. Tissue mend graft with 1 anchor, right shoulder.  PROCEDURE:  Under general anesthesia with the patient in the beach chair position, a routine orthopedic prep and draping of the right shoulder was carried out.  The appropriate time-out was first carried out.  I also marked the appropriate right arm in the holding area.  2 g of IV Ancef were given.  At this time, an incision was made over the anterior aspect of the right shoulder.  I identified the acromion.  I stripped the deltoid tendon by sharp dissection from the acromion.  I also split a small proximal part of the deltoid muscle for visualization purposes. Following that, I noticed a severe impingement downsloping of the acromion.  I protected the underlying cuff with a Bennett retractor and did a partial open acromionectomy with the oscillating saw and acromioplasty.  I thoroughly irrigated out the area of bone, waxed the undersurface of the acromion.  Following that, I identified a complex retracted tear of the rotator cuff.  I then utilized the Ethibond suture #1, Ethibond suture through the cuff from side to side and utilizing the central part  of the long head of the biceps tendon for reinforcement in order to suture the ends of the tendon together.  After the tendon was completely repaired, I then reinforced it with a TissueMend graft utilizing 1 anchor in the proximal humerus.  I thoroughly irrigated out the area.  I inserted some thrombin-soaked Gelfoam in the subacromial space, and then reapproximated deltoid tendon muscle in the usual fashion.  We injected 20 mL of Exparel into the soft tissue structures.  Remaining part of the wound was closed in the usual fashion with a subcuticular Monocryl suture.  Sterile dressings were applied. The patient was placed in a shoulder immobilizer.          ______________________________ Georges Lynch [name], M.D.     RAG/MEDQ  D:  02/28/2013  T:  03/01/2013  Job:  [id number]

## 2013-03-01 NOTE — Progress Notes (Signed)
Occupational Therapy Treatment Patient Details Name: Natalie West MRN: [id number] DOB: [birth year] Today's Date: 03/01/2013 Time: 4098-1191 OT Time Calculation (min): 17 min  OT Assessment / Plan / Recommendation Comments on Treatment Session all education complete    Follow Up Recommendations  Other (comment);Home health OT       Equipment Recommendations  None recommended by OT    Recommendations for Other Services    Frequency Min 2X/week   Plan Discharge plan remains appropriate    Precautions / Restrictions Precautions Precautions: Shoulder Type of Shoulder Precautions: sling at all times. no shoulder movement. no WB Restrictions Weight Bearing Restrictions: Yes       ADL  Toilet Transfer: Performed;Min guard Toilet Transfer Method: Sit to stand Toilet Transfer Equipment: Regular height toilet    OT Diagnosis: Generalized weakness  OT Problem List: Decreased strength OT Treatment Interventions: Self-care/ADL training;Patient/family education   OT Goals Acute Rehab OT Goals OT Goal Formulation: With patient ADL Goals Pt Will Perform Upper Body Bathing: with supervision ADL Goal: Upper Body Bathing - Progress: Met Pt Will Perform Upper Body Dressing: with supervision ADL Goal: Upper Body Dressing - Progress: Met Miscellaneous OT Goals Miscellaneous OT Goal #1: met  Visit Information  Last OT Received On: 03/01/13 Assistance Needed: +1    Subjective Data  Subjective: Husband and pt feel good about managing at home   Prior Functioning  Home Living Lives With: Spouse Available Help at Discharge: Family Type of Home: House Home Layout: One level Bathroom Shower/Tub: Engineer, manufacturing systems: Standard Home Adaptive Equipment: None Prior Function Level of Independence: Independent Communication Communication: No difficulties Dominant Hand: Right    Cognition  Cognition Arousal/Alertness: Awake/alert Overall Cognitive Status: Within Functional  Limits for tasks assessed    Mobility  Transfers Transfers: Sit to Stand;Stand to Sit Sit to Stand: 4: Min guard;From bed;From toilet;From chair/3-in-1 Stand to Sit: 4: Min guard;To toilet;To chair/3-in-1    Exercises  Donning/doffing shirt without moving shoulder: Caregiver independent with task Method for sponge bathing under operated UE: Caregiver independent with task Donning/doffing sling/immobilizer: Caregiver independent with task Correct positioning of sling/immobilizer: Caregiver independent with task ROM for elbow, wrist and digits of operated UE: Caregiver independent with task Sling wearing schedule (on at all times/off for ADL's): Caregiver independent with task Proper positioning of operated UE when showering: Caregiver independent with task Positioning of UE while sleeping: Caregiver independent with task   Balance     End of Session OT - End of Session Activity Tolerance: Patient tolerated treatment well Patient left: in chair;with call bell/phone within reach;with family/visitor present  GO Functional Assessment Tool Used: clinical observation Functional Limitation: Self care Self Care Current Status (Y7829): At least 20 percent but less than 40 percent impaired, limited or restricted Self Care Goal Status (F6213): At least 1 percent but less than 20 percent impaired, limited or restricted Self Care Discharge Status [phone]): At least 20 percent but less than 40 percent impaired, limited or restricted   Norma Ignasiak, Metro Kung 03/01/2013, 11:05 AM

## 2013-03-01 NOTE — Evaluation (Signed)
Occupational Therapy Evaluation Patient Details Name: Natalie West MRN: [id number] DOB: Sep 13, 1931 Today's Date: 03/01/2013 Time: 4098-1191 OT Time Calculation (min): 15 min  OT Assessment / Plan / Recommendation Clinical Impression  Pt presents to OT s/p RTC repair. Pt will benefit from skilled OT for ADL education per MD order with no shoulder movement    OT Assessment  Patient needs continued OT Services    Follow Up Recommendations  Other (comment);Home health OT       Equipment Recommendations  None recommended by OT       Frequency  Min 2X/week    Precautions / Restrictions Precautions Precautions: Shoulder Type of Shoulder Precautions: sling at all times. no shoulder movement. no WB Restrictions Weight Bearing Restrictions: Yes       ADL  Toilet Transfer: Performed;Min guard Toilet Transfer Method: Sit to stand Toilet Transfer Equipment: Regular height toilet    OT Diagnosis: Generalized weakness  OT Problem List: Decreased strength OT Treatment Interventions: Self-care/ADL training;Patient/family education   OT Goals Acute Rehab OT Goals OT Goal Formulation: With patient Miscellaneous OT Goals Miscellaneous OT Goal #1: Pt will don sling with S and no shoulder movement  Visit Information  Last OT Received On: 03/01/13 Assistance Needed: +1    Subjective Data  Subjective: I feel pretty good- but i am right handed   Prior Functioning     Home Living Lives With: Spouse Available Help at Discharge: Family Type of Home: House Home Layout: One level Bathroom Shower/Tub: Engineer, manufacturing systems: Standard Home Adaptive Equipment: None Prior Function Level of Independence: Independent Communication Communication: No difficulties Dominant Hand: Right         Vision/Perception Vision - History Patient Visual Report: No change from baseline   Cognition  Cognition Arousal/Alertness: Awake/alert Overall Cognitive Status: Within  Functional Limits for tasks assessed    Extremity/Trunk Assessment       Mobility Transfers Transfers: Sit to Stand;Stand to Sit Sit to Stand: 4: Min guard;From bed;From toilet;From chair/3-in-1 Stand to Sit: 4: Min guard;To toilet;To chair/3-in-1     Exercise Donning/doffing shirt without moving shoulder: Moderate assistance Method for sponge bathing under operated UE: Moderate assistance Donning/doffing sling/immobilizer: Moderate assistance Correct positioning of sling/immobilizer: Moderate assistance ROM for elbow, wrist and digits of operated UE: Moderate assistance Sling wearing schedule (on at all times/off for ADL's): Moderate assistance Proper positioning of operated UE when showering: Moderate assistance Positioning of UE while sleeping: Moderate assistance      End of Session OT - End of Session Activity Tolerance: Patient tolerated treatment well Patient left: with call bell/phone within reach;in bed  GO Functional Assessment Tool Used: clinical observation Functional Limitation: Self care Self Care Current Status (Y7829): At least 20 percent but less than 40 percent impaired, limited or restricted Self Care Goal Status (F6213): At least 1 percent but less than 20 percent impaired, limited or restricted   Natalie West, Metro Kung 03/01/2013, 8:41 AM

## 2013-03-06 NOTE — Discharge Summary (Signed)
Physician Discharge Summary   Patient ID: Natalie West MRN: [id number] DOB/AGE: Jul 16, 1931 79 y.o.  Admit date: 02/28/2013 Discharge date: 03/01/2013  Primary Diagnosis: Rotator cuff tear, right shoulder  Admission Diagnoses:  Past Medical History  Diagnosis Date  . Hypercholesterolemia   . Esophageal reflux   . Depression   . Anxiety   . Chronic UTI   . Breast cancer   . Esophageal stricture   . Personal history of colonic polyps 02/09/2007    TUBULAR ADENOMA  . Family history of malignant neoplasm of gastrointestinal tract   . PONV (postoperative nausea and vomiting)   . Chronic cough   . Rotator cuff tear     RT  . Osteopenia   . Arthritis   . Back pain, chronic     WITH MOTION  . Hearing loss    Discharge Diagnoses:   Active Problems:   Rotator cuff (capsule) sprain  Estimated body mass index is 28.53 kg/(m^2) as calculated from the following:   Height as of this encounter: 5\' 2"  (1.575 m).   Weight as of this encounter: 70.761 kg (156 lb).  Procedure:  Procedure(s) (LRB): RIGHT ROTATOR CUFF REPAIR SHOULDER OPEN WITH GRAFT AND ANCHORS (Right)   Consults: None  HPI: The patient is a 79 year old female who presented to Dr. Jeannetta Ellis office with right shoulder pain. She did not recall an injury but had pain for about 7 months. She had been seeing her PCP who had sent her to PT. She had not seen much improvement of her symptoms since starting PT. MRI was ordered of her right shoulder which showed a right shoulder rotator cuff tear with retraction.   Laboratory Data: Admission on 02/28/2013, Discharged on 03/01/2013  Component Date Value Range Status  . Sodium 02/28/2013 140  135 - 145 mEq/L Final  . Potassium 02/28/2013 3.6  3.5 - 5.1 mEq/L Final  . Chloride 02/28/2013 105  96 - 112 mEq/L Final  . CO2 02/28/2013 26  19 - 32 mEq/L Final  . Glucose, Bld 02/28/2013 91  70 - 99 mg/dL Final  . BUN 40/98/1191 13  6 - 23 mg/dL Final  . Creatinine, Ser 02/28/2013 0.87   0.50 - 1.10 mg/dL Final  . Calcium 47/82/9562 9.5  8.4 - 10.5 mg/dL Final  . Total Protein 02/28/2013 6.7  6.0 - 8.3 g/dL Final  . Albumin 13/06/6577 3.8  3.5 - 5.2 g/dL Final  . AST 46/96/2952 21  0 - 37 U/L Final  . ALT 02/28/2013 11  0 - 35 U/L Final  . Alkaline Phosphatase 02/28/2013 93  39 - 117 U/L Final  . Total Bilirubin 02/28/2013 0.4  0.3 - 1.2 mg/dL Final  . GFR calc non Af Amer 02/28/2013 62* >90 mL/min Final  . GFR calc Af Amer 02/28/2013 72* >90 mL/min Final   Comment:                                 The eGFR has been calculated                          using the CKD EPI equation.                          This calculation has not been  validated in all clinical                          situations.                          eGFR's persistently                          <90 mL/min signify                          possible Chronic Kidney Disease.  Marland Kitchen Prothrombin Time 02/28/2013 12.9  11.6 - 15.2 seconds Final  . INR 02/28/2013 0.98  0.00 - 1.49 Final  . aPTT 02/28/2013 36  24 - 37 seconds Final  . Color, Urine 02/28/2013 YELLOW  YELLOW Final  . APPearance 02/28/2013 CLEAR  CLEAR Final  . Specific Gravity, Urine 02/28/2013 1.016  1.005 - 1.030 Final  . pH 02/28/2013 5.5  5.0 - 8.0 Final  . Glucose, UA 02/28/2013 NEGATIVE  NEGATIVE mg/dL Final  . Hgb urine dipstick 02/28/2013 NEGATIVE  NEGATIVE Final  . Bilirubin Urine 02/28/2013 NEGATIVE  NEGATIVE Final  . Ketones, ur 02/28/2013 NEGATIVE  NEGATIVE mg/dL Final  . Protein, ur 19/14/7829 NEGATIVE  NEGATIVE mg/dL Final  . Urobilinogen, UA 02/28/2013 0.2  0.0 - 1.0 mg/dL Final  . Nitrite 56/21/3086 NEGATIVE  NEGATIVE Final  . Leukocytes, UA 02/28/2013 MODERATE* NEGATIVE Final  . Squamous Epithelial / LPF 02/28/2013 FEW* RARE Final  . WBC, UA 02/28/2013 21-50  <3 WBC/hpf Final   IN CLUMPS  Hospital Outpatient Visit on 02/22/2013  Component Date Value Range Status  . WBC 02/22/2013 5.5  4.0 - 10.5 K/uL  Final  . RBC 02/22/2013 4.27  3.87 - 5.11 MIL/uL Final  . Hemoglobin 02/22/2013 13.1  12.0 - 15.0 g/dL Final  . HCT 57/84/6962 40.3  36.0 - 46.0 % Final  . MCV 02/22/2013 94.4  78.0 - 100.0 fL Final  . MCH 02/22/2013 30.7  26.0 - 34.0 pg Final  . MCHC 02/22/2013 32.5  30.0 - 36.0 g/dL Final  . RDW 95/28/4132 12.6  11.5 - 15.5 % Final  . Platelets 02/22/2013 210  150 - 400 K/uL Final  . MRSA, PCR 02/22/2013 NEGATIVE  NEGATIVE Final  . Staphylococcus aureus 02/22/2013 NEGATIVE  NEGATIVE Final   Comment:                                 The Xpert SA Assay (FDA                          approved for NASAL specimens                          in patients over 23 years of age),                          is one component of                          a comprehensive surveillance  program.  Test performance has                          been validated by [facility] for patients greater                          than or equal to 60 year old.                          It is not intended                          to diagnose infection nor to                          guide or monitor treatment.     Hospital Course: Natalie West is a 79 y.o. who was admitted to [facility]. They were brought to the operating room on 02/28/2013 and underwent Procedure(s): RIGHT ROTATOR CUFF REPAIR SHOULDER OPEN WITH GRAFT AND ANCHORS.  Patient tolerated the procedure well and was later transferred to the recovery room and then to the orthopaedic floor for postoperative care.  They were given PO and IV analgesics for pain control following their surgery.  They were given 24 hours of postoperative antibiotics of  Anti-infectives   Start     Dose/Rate Route Frequency Ordered Stop   02/28/13 1600  trimethoprim (TRIMPEX) tablet 100 mg  Status:  Discontinued     100 mg Oral Daily 02/28/13 1400 03/01/13 1346   02/28/13 1600  ceFAZolin (ANCEF) IVPB 1 g/50 mL premix     1 g 100  mL/hr over 30 Minutes Intravenous Every 6 hours 02/28/13 1400 03/01/13 0430   02/28/13 1129  polymyxin B 500,000 Units, bacitracin 50,000 Units in sodium chloride irrigation 0.9 % 500 mL irrigation  Status:  Discontinued       As needed 02/28/13 1129 02/28/13 1227   02/28/13 0738  ceFAZolin (ANCEF) IVPB 2 g/50 mL premix     2 g 100 mL/hr over 30 Minutes Intravenous On call to O.R. 02/28/13 1610 02/28/13 1045    OT was ordered.  Discharge planning consulted to help with postop disposition and equipment needs.  Patient had a good night on the evening of surgery.  Patient was seen in rounds and was ready to go home after meeting with OT.    Discharge Medications: Prior to Admission medications   Medication Sig Start Date End Date Taking? Authorizing Provider  omeprazole (PRILOSEC) 20 MG capsule Take 20 mg by mouth daily.   Yes Historical Provider, MD  perphenazine-amitriptyline (ETRAFON/TRIAVIL) 2-10 MG TABS Take 1 tablet by mouth 2 (two) times daily.   Yes Historical Provider, MD  pravastatin (PRAVACHOL) 20 MG tablet Take 20 mg by mouth every evening.    Yes Historical Provider, MD  trimethoprim (TRIMPEX) 100 MG tablet Take 100 mg by mouth daily.   Yes Historical Provider, MD  Calcium Carbonate-Vitamin D (CALCIUM 600 + D PO) Take 1 capsule by mouth daily.    Historical Provider, MD  HYDROmorphone (DILAUDID) 2 MG tablet Take 1 tablet (2 mg total) by mouth every 4 (four) hours as needed. 03/01/13   Delon Revelo  Tamala Ser, PA-C  methocarbamol (ROBAXIN) 500 MG tablet Take 1 tablet (500 mg total) by mouth every 6 (six) hours as needed. 03/01/13   Amarien Carne Tamala Ser, PA-C  Multiple Vitamins-Minerals (CENTRUM SILVER PO) Take 1 tablet by mouth daily.    Historical Provider, MD  traMADol (ULTRAM) 50 MG tablet Take 1 tablet (50 mg total) by mouth every 6 (six) hours as needed for pain. 03/01/13   Windie Marasco Tamala Ser, PA-C    Diet: Cardiac diet Activity:Wear sling at all times Follow-up:in 10-12  days Disposition - Home Discharged Condition: good   Discharge Orders   Future Appointments Provider Department Dept Phone   03/17/2013 11:30 AM Krista [facility] MEDICAL ONCOLOGY [phone]   03/17/2013 12:00 PM Exie Parody, MD [facility] MEDICAL ONCOLOGY [phone]   Future Orders Complete By Expires     Call MD / Call 911  As directed     Comments:      If you experience chest pain or shortness of breath, CALL 911 and be transported to the hospital emergency room.  If you develope a fever above 101 F, pus (white drainage) or increased drainage or redness at the wound, or calf pain, call your surgeon's office.    Constipation Prevention  As directed     Comments:      Drink plenty of fluids.  Prune juice may be helpful.  You may use a stool softener, such as Colace (over the counter) 100 mg twice a day.  Use MiraLax (over the counter) for constipation as needed.    Diet - low sodium heart healthy  As directed     Discharge instructions  As directed     Comments:      Keep your sling on at all times, including sleeping in your sling. The only time you should remove your sling is to shower only but you need to keep your hand against your chest while you shower.  For the first few days, remove your dressing, tape a piece of saran wrap over your incision, take your shower, then remove the saran wrap and put a clean dressing on, then reapply your sling. After two days you can shower without the saran wrap.  Call Dr. [name] if any wound complications or temperature of 101 degrees F or over.  Call the office for an appointment to see Dr. [name] in two weeks: [phone] and ask for Dr. Jeannetta Ellis nurse, Mackey Birchwood.    Driving restrictions  As directed     Comments:      No driving    Increase activity slowly as tolerated  As directed     Lifting restrictions  As directed     Comments:      No lifting        Medication List    TAKE these  medications       CALCIUM 600 + D PO  Take 1 capsule by mouth daily.     CENTRUM SILVER PO  Take 1 tablet by mouth daily.     HYDROmorphone 2 MG tablet  Commonly known as:  DILAUDID  Take 1 tablet (2 mg total) by mouth every 4 (four) hours as needed.     methocarbamol 500 MG tablet  Commonly known as:  ROBAXIN  Take 1 tablet (500 mg total) by mouth every 6 (six) hours as needed.     omeprazole 20 MG capsule  Commonly known as:  PRILOSEC  Take 20  mg by mouth daily.     perphenazine-amitriptyline 2-10 MG Tabs  Commonly known as:  ETRAFON/TRIAVIL  Take 1 tablet by mouth 2 (two) times daily.     pravastatin 20 MG tablet  Commonly known as:  PRAVACHOL  Take 20 mg by mouth every evening.     traMADol 50 MG tablet  Commonly known as:  ULTRAM  Take 1 tablet (50 mg total) by mouth every 6 (six) hours as needed for pain.     trimethoprim 100 MG tablet  Commonly known as:  TRIMPEX  Take 100 mg by mouth daily.           Follow-up Information   Follow up with GIOFFRE,RONALD A, MD. Schedule an appointment as soon as possible for a visit in 10 days.   Contact information:   [address], [address], [place] [address] [place] [address] [phone]       Signed: Kerby Nora 03/06/2013, 8:56 AM

## 2013-03-15 ENCOUNTER — Telehealth: Payer: Self-pay | Admitting: Oncology

## 2013-03-17 ENCOUNTER — Other Ambulatory Visit: Payer: Medicare Other

## 2013-03-17 ENCOUNTER — Ambulatory Visit: Payer: Medicare Other | Admitting: Oncology

## 2013-03-31 ENCOUNTER — Ambulatory Visit (HOSPITAL_BASED_OUTPATIENT_CLINIC_OR_DEPARTMENT_OTHER): Payer: Medicare Other | Admitting: Oncology

## 2013-03-31 ENCOUNTER — Telehealth: Payer: Self-pay | Admitting: Oncology

## 2013-03-31 ENCOUNTER — Other Ambulatory Visit (HOSPITAL_BASED_OUTPATIENT_CLINIC_OR_DEPARTMENT_OTHER): Payer: Medicare Other | Admitting: Lab

## 2013-03-31 VITALS — BP 146/89 | HR 68 | Temp 97.4°F | Resp 17 | Ht 62.0 in | Wt 153.2 lb

## 2013-03-31 DIAGNOSIS — C50919 Malignant neoplasm of unspecified site of unspecified female breast: Secondary | ICD-10-CM

## 2013-03-31 DIAGNOSIS — K219 Gastro-esophageal reflux disease without esophagitis: Secondary | ICD-10-CM

## 2013-03-31 DIAGNOSIS — Z853 Personal history of malignant neoplasm of breast: Secondary | ICD-10-CM

## 2013-03-31 DIAGNOSIS — M7989 Other specified soft tissue disorders: Secondary | ICD-10-CM

## 2013-03-31 DIAGNOSIS — E785 Hyperlipidemia, unspecified: Secondary | ICD-10-CM

## 2013-03-31 LAB — COMPREHENSIVE METABOLIC PANEL (CC13)
ALT: 8 U/L (ref 0–55)
AST: 17 U/L (ref 5–34)
Albumin: 3.5 g/dL (ref 3.5–5.0)
Alkaline Phosphatase: 102 U/L (ref 40–150)
BUN: 15.5 mg/dL (ref 7.0–26.0)
CO2: 22 mEq/L (ref 22–29)
Calcium: 8.9 mg/dL (ref 8.4–10.4)
Chloride: 109 mEq/L — ABNORMAL HIGH (ref 98–107)
Creatinine: 0.8 mg/dL (ref 0.6–1.1)
Glucose: 81 mg/dl (ref 70–99)
Potassium: 4 mEq/L (ref 3.5–5.1)
Sodium: 141 mEq/L (ref 136–145)
Total Bilirubin: 0.49 mg/dL (ref 0.20–1.20)
Total Protein: 6.6 g/dL (ref 6.4–8.3)

## 2013-03-31 LAB — CBC WITH DIFFERENTIAL/PLATELET
BASO%: 0.3 % (ref 0.0–2.0)
Basophils Absolute: 0 10e3/uL (ref 0.0–0.1)
EOS%: 3.4 % (ref 0.0–7.0)
Eosinophils Absolute: 0.2 10e3/uL (ref 0.0–0.5)
HCT: 38.5 % (ref 34.8–46.6)
HGB: 12.7 g/dL (ref 11.6–15.9)
LYMPH%: 24.3 % (ref 14.0–49.7)
MCH: 30.4 pg (ref 25.1–34.0)
MCHC: 33 g/dL (ref 31.5–36.0)
MCV: 92 fL (ref 79.5–101.0)
MONO#: 0.6 10e3/uL (ref 0.1–0.9)
MONO%: 8.4 % (ref 0.0–14.0)
NEUT#: 4.2 10e3/uL (ref 1.5–6.5)
NEUT%: 63.6 % (ref 38.4–76.8)
Platelets: 198 10e3/uL (ref 145–400)
RBC: 4.19 10e6/uL (ref 3.70–5.45)
RDW: 13.2 % (ref 11.2–14.5)
WBC: 6.6 10e3/uL (ref 3.9–10.3)
lymph#: 1.6 10e3/uL (ref 0.9–3.3)

## 2013-03-31 NOTE — Progress Notes (Signed)
[facility] OFFICE PROGRESS NOTE  Cc:  Pamelia Hoit, MD  DIAGNOSIS:  history of stage I right inflammatory breast carcinoma.  Tumor was 0.5 cm;  estrogen positive, PR negative, HER2/neu positive without FISH amplification; 0 out of 2 lymph nodes positive.  PAST THERAPY:   1.  Status post lumpectomy, followed by adjuvant radiation therapy to the right breast. 2.  Aromasin 25 mg p.o. daily between  April of 2006 and April 2011.  CURRENT THERAPY:  Watchful observation.  INTERVAL HISTORY: Natalie West 79 y.o. female returns for regular follow up with her husband.  She recently underwent right shoulder rotator cuff repair.  The pain in the right shoulder has significantly improved.  She did not think that she was on DVT prophy.  She had bilateral lower extremity for many years; however, she thinks that the right leg is much more swollen and tender in the calf compared to the left leg.  Being off of Aromasin, her depression has significantly improved.  She denied any breast mass, skin changes.  She has mild fatigue from recent surgery; however, she is still independent of activities of daily living.   Patient denies fever, anorexia, weight loss, headache, visual changes, confusion, drenching night sweats, palpable lymph node swelling, mucositis, odynophagia, dysphagia, nausea vomiting, jaundice, chest pain, palpitation, shortness of breath, dyspnea on exertion, productive cough, gum bleeding, epistaxis, hematemesis, hemoptysis, abdominal pain, abdominal swelling, early satiety, melena, hematochezia, hematuria, skin rash, spontaneous bleeding, heat or cold intolerance, bowel bladder incontinence, back pain, focal motor weakness, paresthesia.      Past Medical History  Diagnosis Date  . Hypercholesterolemia   . Esophageal reflux   . Depression   . Anxiety   . Chronic UTI   . Breast cancer   . Esophageal stricture   . Personal history of colonic polyps 02/09/2007    TUBULAR  ADENOMA  . Family history of malignant neoplasm of gastrointestinal tract   . PONV (postoperative nausea and vomiting)   . Chronic cough   . Rotator cuff tear     RT  . Osteopenia   . Arthritis   . Back pain, chronic     WITH MOTION  . Hearing loss     Past Surgical History  Procedure Laterality Date  . Breast lumpectomy      right  . Umbilical hernia repair    . Dilation and curettage of uterus    . Colonoscopy    . Polypectomy    . Shoulder open rotator cuff repair Right 02/28/2013    Procedure: RIGHT ROTATOR CUFF REPAIR SHOULDER OPEN WITH GRAFT AND ANCHORS;  Surgeon: Jacki Cones, MD;  Location: [facility];  Service: Orthopedics;  Laterality: Right;    Current Outpatient Prescriptions  Medication Sig Dispense Refill  . Calcium Carbonate-Vitamin D (CALCIUM 600 + D PO) Take 1 capsule by mouth daily.      Marland Kitchen HYDROmorphone (DILAUDID) 2 MG tablet Take 1 tablet (2 mg total) by mouth every 4 (four) hours as needed.  60 tablet  0  . methocarbamol (ROBAXIN) 500 MG tablet Take 1 tablet (500 mg total) by mouth every 6 (six) hours as needed.  40 tablet  1  . Multiple Vitamins-Minerals (CENTRUM SILVER PO) Take 1 tablet by mouth daily.      Marland Kitchen omeprazole (PRILOSEC) 20 MG capsule Take 20 mg by mouth daily.      Marland Kitchen perphenazine-amitriptyline (ETRAFON/TRIAVIL) 2-10 MG TABS Take 1 tablet by mouth 2 (two) times daily.      Marland Kitchen  pravastatin (PRAVACHOL) 20 MG tablet Take 20 mg by mouth every evening.       . traMADol (ULTRAM) 50 MG tablet Take 1 tablet (50 mg total) by mouth every 6 (six) hours as needed for pain.  60 tablet  0  . trimethoprim (TRIMPEX) 100 MG tablet Take 100 mg by mouth daily.       No current facility-administered medications for this visit.    ALLERGIES:  is allergic to ciprofloxacin; clindamycin/lincomycin; codeine; diclofenac; fenoprofen calcium; flexeril; noroxin; relafen; and sulfa antibiotics.  REVIEW OF SYSTEMS:  The rest of the 14-point review of system was negative.    Filed Vitals:   03/31/13 0932  BP: 146/89  Pulse: 68  Temp: 97.4 F (36.3 C)  Resp: 17   Wt Readings from Last 3 Encounters:  03/31/13 153 lb 3.2 oz (69.491 kg)  02/28/13 156 lb (70.761 kg)  02/28/13 156 lb (70.761 kg)   ECOG Performance status: 1 due to recent surgery.   PHYSICAL EXAMINATION:  General:  well-nourished in no acute distress.  Eyes:  no scleral icterus.  ENT:  There were no oropharyngeal lesions.  Neck was without thyromegaly.  Lymphatics:  Negative cervical, supraclavicular or axillary adenopathy.  Respiratory: lungs were clear bilaterally without wheezing or crackles.  Cardiovascular:  Regular rate and rhythm, S1/S2, without murmur, rub or gallop.  There was 1+ nonpitting pedal edema in the right leg.  I could not feel any palpable cord.  She had tenderness on palpation of right calf.   GI:  abdomen was soft, flat, nontender, nondistended, without organomegaly.  Muscoloskeletal:  no spinal tenderness of palpation of vertebral spine. The surgical scar over the right shoulder has healed without erythema, discharge, pain.  Skin exam was without echymosis, petichae.  Neuro exam was nonfocal.  Patient was able to get on and off exam table without assistance.  Gait was normal.  Patient was alerted and oriented.  Attention was good.   Language was appropriate.  Mood was normal without depression.  Speech was not pressured.  Thought content was not tangential.   Bilateral breast exam was negative for palpable mass, pain, skin thickening,    LABORATORY/RADIOLOGY DATA:  Lab Results  Component Value Date   WBC 6.6 03/31/2013   HGB 12.7 03/31/2013   HCT 38.5 03/31/2013   PLT 198 03/31/2013   GLUCOSE 81 03/31/2013   ALT 8 03/31/2013   AST 17 03/31/2013   NA 141 03/31/2013   K 4.0 03/31/2013   CL 109* 03/31/2013   CREATININE 0.8 03/31/2013   BUN 15.5 03/31/2013   CO2 22 03/31/2013   INR 0.98 02/28/2013    ASSESSMENT AND PLAN:   1. History of breast cancer:   Continue to be in  remission.  Last mammogram in 10/2012 was negative. Next one is due in 10/2013.  I ordered this.  She would like to continue to follow with the Cancer [facility].   2. History of depression:  Improved being off of AI.  3. Gastroesophageal reflux disease.  She is on omeprazole. 4. Hyperlipidemia.  She is on pravastatin. 5. Right leg swelling/pain:  I requested Doppler US to rule out DVT.  6. F/U in about 1 year.    I informed Ms. Selleck and her husband that I am leaving the practice.  The [facility] will arrange for her to see another provider when she returns.     The length of time of the face-to-face encounter was 15 minutes. More than 50% of  time was spent counseling and coordination of care.

## 2013-04-03 ENCOUNTER — Telehealth: Payer: Self-pay | Admitting: *Deleted

## 2013-04-03 ENCOUNTER — Ambulatory Visit (HOSPITAL_COMMUNITY)
Admission: RE | Admit: 2013-04-03 | Discharge: 2013-04-03 | Disposition: A | Discharge status: Home / Self Care | Payer: Medicare Other | Source: Ambulatory Visit | Attending: Oncology | Admitting: Oncology

## 2013-04-03 DIAGNOSIS — M79609 Pain in unspecified limb: Secondary | ICD-10-CM

## 2013-04-03 DIAGNOSIS — M7989 Other specified soft tissue disorders: Secondary | ICD-10-CM

## 2013-04-03 NOTE — Progress Notes (Signed)
*  Preliminary Results* Right lower extremity venous duplex completed. Right lower extremity is negative for deep vein thrombosis. There is no evidence of right Baker's cyst. Preliminary results discussed with Cameo of Dr. Lodema Pilot office.  04/03/2013 10:23 AM Gertie Fey, RDMS, RDCS

## 2013-04-03 NOTE — Telephone Encounter (Signed)
Call from [place] states doppler Negative for DVT.  Asked Natalie West to instruct pt to f/u w/ PCP for the pain and swelling in her leg.  She will relay message to pt and have her call us if any questions.

## 2013-04-05 ENCOUNTER — Encounter: Payer: Self-pay | Admitting: Oncology

## 2013-11-01 ENCOUNTER — Ambulatory Visit
Admission: RE | Admit: 2013-11-01 | Discharge: 2013-11-01 | Disposition: A | Discharge status: Home / Self Care | Payer: Medicare Other | Source: Ambulatory Visit | Attending: Oncology | Admitting: Oncology

## 2013-11-01 DIAGNOSIS — C50919 Malignant neoplasm of unspecified site of unspecified female breast: Secondary | ICD-10-CM

## 2014-03-29 ENCOUNTER — Other Ambulatory Visit: Payer: Self-pay | Admitting: Hematology and Oncology

## 2014-03-30 ENCOUNTER — Ambulatory Visit: Payer: Medicare Other | Admitting: Hematology and Oncology

## 2014-03-30 ENCOUNTER — Other Ambulatory Visit: Payer: Medicare Other

## 2014-03-30 ENCOUNTER — Encounter: Payer: Self-pay | Admitting: Medical Oncology

## 2014-04-11 ENCOUNTER — Telehealth: Payer: Self-pay | Admitting: Hematology and Oncology

## 2014-04-11 NOTE — Telephone Encounter (Signed)
PT CALLED TODAY TO R/S 5/26 LB/FU AND WANTS A NEW DOCTOR. FORMER [facility] PT WAS SCHEDULED TO SEE NG - NOT YET ESTABLISHED. PT GIVEN NEW APPT FOR LB/FU 8/27.

## 2014-06-12 ENCOUNTER — Encounter: Payer: Self-pay | Admitting: Gastroenterology

## 2014-06-27 ENCOUNTER — Other Ambulatory Visit: Payer: Self-pay | Admitting: *Deleted

## 2014-06-27 DIAGNOSIS — C50919 Malignant neoplasm of unspecified site of unspecified female breast: Secondary | ICD-10-CM

## 2014-06-28 ENCOUNTER — Other Ambulatory Visit (HOSPITAL_BASED_OUTPATIENT_CLINIC_OR_DEPARTMENT_OTHER): Payer: Medicare HMO

## 2014-06-28 ENCOUNTER — Encounter: Payer: Self-pay | Admitting: Hematology and Oncology

## 2014-06-28 ENCOUNTER — Telehealth: Payer: Self-pay | Admitting: Hematology and Oncology

## 2014-06-28 ENCOUNTER — Other Ambulatory Visit: Payer: Self-pay | Admitting: Hematology and Oncology

## 2014-06-28 ENCOUNTER — Ambulatory Visit (HOSPITAL_BASED_OUTPATIENT_CLINIC_OR_DEPARTMENT_OTHER): Payer: Medicare HMO | Admitting: Hematology and Oncology

## 2014-06-28 VITALS — BP 144/76 | HR 82 | Temp 98.7°F | Resp 18 | Ht 62.0 in | Wt 151.1 lb

## 2014-06-28 DIAGNOSIS — Z853 Personal history of malignant neoplasm of breast: Secondary | ICD-10-CM

## 2014-06-28 DIAGNOSIS — C50911 Malignant neoplasm of unspecified site of right female breast: Secondary | ICD-10-CM

## 2014-06-28 DIAGNOSIS — Z1231 Encounter for screening mammogram for malignant neoplasm of breast: Secondary | ICD-10-CM

## 2014-06-28 DIAGNOSIS — C50919 Malignant neoplasm of unspecified site of unspecified female breast: Secondary | ICD-10-CM

## 2014-06-28 LAB — CBC WITH DIFFERENTIAL/PLATELET
BASO%: 0.4 % (ref 0.0–2.0)
Basophils Absolute: 0 10*3/uL (ref 0.0–0.1)
EOS%: 1.6 % (ref 0.0–7.0)
Eosinophils Absolute: 0.1 10*3/uL (ref 0.0–0.5)
HCT: 41 % (ref 34.8–46.6)
HGB: 13.2 g/dL (ref 11.6–15.9)
LYMPH%: 28.4 % (ref 14.0–49.7)
MCH: 30.6 pg (ref 25.1–34.0)
MCHC: 32.1 g/dL (ref 31.5–36.0)
MCV: 95.3 fL (ref 79.5–101.0)
MONO#: 0.5 10*3/uL (ref 0.1–0.9)
MONO%: 7.8 % (ref 0.0–14.0)
NEUT#: 3.9 10*3/uL (ref 1.5–6.5)
NEUT%: 61.8 % (ref 38.4–76.8)
Platelets: 184 10*3/uL (ref 145–400)
RBC: 4.31 10*6/uL (ref 3.70–5.45)
RDW: 13 % (ref 11.2–14.5)
WBC: 6.4 10*3/uL (ref 3.9–10.3)
lymph#: 1.8 10*3/uL (ref 0.9–3.3)

## 2014-06-28 LAB — COMPREHENSIVE METABOLIC PANEL (CC13)
ALT: 16 U/L (ref 0–55)
AST: 25 U/L (ref 5–34)
Albumin: 3.7 g/dL (ref 3.5–5.0)
Alkaline Phosphatase: 85 U/L (ref 40–150)
Anion Gap: 8 mEq/L (ref 3–11)
BUN: 16 mg/dL (ref 7.0–26.0)
CO2: 25 mEq/L (ref 22–29)
Calcium: 9.1 mg/dL (ref 8.4–10.4)
Chloride: 109 mEq/L (ref 98–109)
Creatinine: 0.8 mg/dL (ref 0.6–1.1)
Glucose: 92 mg/dl (ref 70–140)
Potassium: 3.9 mEq/L (ref 3.5–5.1)
Sodium: 142 mEq/L (ref 136–145)
Total Bilirubin: 0.47 mg/dL (ref 0.20–1.20)
Total Protein: 6.6 g/dL (ref 6.4–8.3)

## 2014-06-28 NOTE — Progress Notes (Signed)
Patient Care Team: Natalie Sacramento, MD as PCP - General (Family Medicine)  DIAGNOSIS: Breast cancer   Primary site: Breast (Right)   Clinical: (T1a, N0, cM0)   Pathologic: Stage IA (T1a, N0, cM0) signed by Rulon Eisenmenger, MD on 06/28/2014  1:40 PM   Summary: Stage IA (T1a, N0, cM0)   SUMMARY OF ONCOLOGIC HISTORY:   Breast cancer   11/10/2004 Surgery Right breast lumpectomy invasive ductal carcinoma grade 1; 0.5 cm 2 sentinel lymph nodes negative ER 85% PR 0% Ki-67 19% HER-2 2+ by IHC and negative by fish, no lymphovascular invasion T1, N0, M0 stage IA   12/29/2004 - 01/26/2005 Radiation Therapy Radiation therapy to lumpectomy site   02/26/2005 - 02/26/2010 Anti-estrogen oral therapy Aromasin 25 mg by mouth daily: Side effects including depression.    CHIEF COMPLIANT: Followup of history of breast cancer  INTERVAL HISTORY: Natalie West is 80 year old Caucasian lady with above-mentioned history of right-sided breast cancer she underwent lumpectomy and radiation in 2006 it was ER positive HER-2 negative disease. She underwent radiation to lumpectomy site and remained on aromatase inhibitor with Aromasin once daily from 2006-2011. She is currently being observed with annual mammograms. Her last mammogram was in January 2015 which was normal. Today she is here without any major problems or concerns. Her husband is ill at home with hospice care. Her son lives behind her trailer home and has blindness since he was a child and is dependent on them as well. Other than this she is without any new complaints or concerns.   REVIEW OF SYSTEMS:   Constitutional: Denies fevers, chills or abnormal weight loss Eyes: Denies blurriness of vision Ears, nose, mouth, throat, and face: Denies mucositis or sore throat Respiratory: Denies cough, dyspnea or wheezes Cardiovascular: Denies palpitation, chest discomfort or lower extremity swelling Gastrointestinal:  Denies nausea, heartburn or change in bowel habits Skin:  Denies abnormal skin rashes Lymphatics: Denies new lymphadenopathy or easy bruising Neurological:Denies numbness, tingling or new weaknesses Behavioral/Psych: Mood is stable, no new changes  Breast:  denies any pain or lumps or nodules in either breasts All other systems were reviewed with the patient and are negative.  I have reviewed the past medical history, past surgical history, social history and family history with the patient and they are unchanged from previous note.  ALLERGIES:  is allergic to ciprofloxacin; clindamycin/lincomycin; codeine; diclofenac; fenoprofen calcium; flexeril; noroxin; relafen; and sulfa antibiotics.  MEDICATIONS:  Current Outpatient Prescriptions  Medication Sig Dispense Refill  . Multiple Vitamins-Minerals (CENTRUM SILVER PO) Take 1 tablet by mouth daily.      Marland Kitchen omeprazole (PRILOSEC) 20 MG capsule Take 20 mg by mouth daily.      Marland Kitchen perphenazine-amitriptyline (ETRAFON/TRIAVIL) 2-10 MG TABS Take 1 tablet by mouth 2 (two) times daily.      . pravastatin (PRAVACHOL) 20 MG tablet Take 20 mg by mouth every evening.       . Calcium Carbonate-Vitamin D (CALCIUM 600 + D PO) Take 1 capsule by mouth daily.      Marland Kitchen HYDROmorphone (DILAUDID) 2 MG tablet Take 1 tablet (2 mg total) by mouth every 4 (four) hours as needed.  60 tablet  0  . methocarbamol (ROBAXIN) 500 MG tablet Take 1 tablet (500 mg total) by mouth every 6 (six) hours as needed.  40 tablet  1  . traMADol (ULTRAM) 50 MG tablet Take 1 tablet (50 mg total) by mouth every 6 (six) hours as needed for pain.  60 tablet  0  .  trimethoprim (TRIMPEX) 100 MG tablet Take 100 mg by mouth daily.       No current facility-administered medications for this visit.    PHYSICAL EXAMINATION: ECOG PERFORMANCE STATUS: 1 - Symptomatic but completely ambulatory  Filed Vitals:   06/28/14 1419  BP: 144/76  Pulse: 82  Temp: 98.7 F (37.1 C)  Resp: 18   Filed Weights   06/28/14 1419  Weight: 151 lb 1.6 oz (68.539 kg)     GENERAL:alert, no distress and comfortable SKIN: skin color, texture, turgor are normal, no rashes or significant lesions EYES: normal, Conjunctiva are pink and non-injected, sclera clear OROPHARYNX:no exudate, no erythema and lips, buccal mucosa, and tongue normal  NECK: supple, thyroid normal size, non-tender, without nodularity LYMPH:  no palpable lymphadenopathy in the cervical, axillary or inguinal LUNGS: clear to auscultation and percussion with normal breathing effort HEART: regular rate & rhythm and no murmurs and no lower extremity edema ABDOMEN:abdomen soft, non-tender and normal bowel sounds Musculoskeletal:no cyanosis of digits and no clubbing  NEURO: alert & oriented x 3 with fluent speech, no focal motor/sensory deficits BREAST: No palpable masses lungs or nodules in either right or left breasts. No palpable axillary supraclavicular or infraclavicular adenopathy no breast tenderness or nipple discharge.   LABORATORY DATA:  I have reviewed the data as listed Appointment on 06/28/2014  Component Date Value Ref Range Status  . WBC 06/28/2014 6.4  3.9 - 10.3 10e3/uL Final  . NEUT# 06/28/2014 3.9  1.5 - 6.5 10e3/uL Final  . HGB 06/28/2014 13.2  11.6 - 15.9 g/dL Final  . HCT 06/28/2014 41.0  34.8 - 46.6 % Final  . Platelets 06/28/2014 184  145 - 400 10e3/uL Final  . MCV 06/28/2014 95.3  79.5 - 101.0 fL Final  . MCH 06/28/2014 30.6  25.1 - 34.0 pg Final  . MCHC 06/28/2014 32.1  31.5 - 36.0 g/dL Final  . RBC 06/28/2014 4.31  3.70 - 5.45 10e6/uL Final  . RDW 06/28/2014 13.0  11.2 - 14.5 % Final  . lymph# 06/28/2014 1.8  0.9 - 3.3 10e3/uL Final  . MONO# 06/28/2014 0.5  0.1 - 0.9 10e3/uL Final  . Eosinophils Absolute 06/28/2014 0.1  0.0 - 0.5 10e3/uL Final  . Basophils Absolute 06/28/2014 0.0  0.0 - 0.1 10e3/uL Final  . NEUT% 06/28/2014 61.8  38.4 - 76.8 % Final  . LYMPH% 06/28/2014 28.4  14.0 - 49.7 % Final  . MONO% 06/28/2014 7.8  0.0 - 14.0 % Final  . EOS% 06/28/2014  1.6  0.0 - 7.0 % Final  . BASO% 06/28/2014 0.4  0.0 - 2.0 % Final  . Sodium 06/28/2014 142  136 - 145 mEq/L Final  . Potassium 06/28/2014 3.9  3.5 - 5.1 mEq/L Final  . Chloride 06/28/2014 109  98 - 109 mEq/L Final  . CO2 06/28/2014 25  22 - 29 mEq/L Final  . Glucose 06/28/2014 92  70 - 140 mg/dl Final  . BUN 06/28/2014 16.0  7.0 - 26.0 mg/dL Final  . Creatinine 06/28/2014 0.8  0.6 - 1.1 mg/dL Final  . Total Bilirubin 06/28/2014 0.47  0.20 - 1.20 mg/dL Final  . Alkaline Phosphatase 06/28/2014 85  40 - 150 U/L Final  . AST 06/28/2014 25  5 - 34 U/L Final  . ALT 06/28/2014 16  0 - 55 U/L Final  . Total Protein 06/28/2014 6.6  6.4 - 8.3 g/dL Final  . Albumin 06/28/2014 3.7  3.5 - 5.0 g/dL Final  . Calcium 06/28/2014 9.1  8.4 - 10.4 mg/dL Final  . Anion Gap 06/28/2014 8  3 - 11 mEq/L Final    RADIOGRAPHIC STUDIES: I have personally reviewed the radiology reports and agreed with their findings. No results found.   ASSESSMENT & PLAN:  Breast cancer Right breast cancer T1 A. N0 M0 stage IA diagnosed in 2006 treated with lumpectomy and radiation followed by antiestrogen therapy with Aromasin 2006-2011. She is currently being watched with annual mammograms. Her last mammogram in January 2015 was normal. Today's breast exam did not reveal any lumps nodules or abnormalities.  Survivorship: I encouraged her to do daily exercise especially walking for 30 minutes 6 days a week.  Return to clinic in January after undergoing another mammogram.   Orders Placed This Encounter  Procedures  . MM Digital Diagnostic Bilat    Standing Status: Future     Number of Occurrences:      Standing Expiration Date: 06/28/2015    Order Specific Question:  Reason for Exam (SYMPTOM  OR DIAGNOSIS REQUIRED)    Answer:  annual mammogram H/O Right breast cancer    Order Specific Question:  Preferred imaging location?    Answer:  [facility]   The patient has a good understanding of the overall plan. she  agrees with it. She will call with any problems that may develop before her next visit here.  I spent 25 minutes counseling the patient face to face. The total time spent in the appointment was 30 minutes and more than 50% was on counseling and review of test results    Rulon Eisenmenger, MD 06/28/2014 2:47 PM

## 2014-06-28 NOTE — Patient Instructions (Signed)
Pertuzumab injection What is this medicine? PERTUZUMAB (per TOOZ ue mab) is a monoclonal antibody that targets a protein called HER2. HER2 is found in some breast cancers. This medicine can stop cancer cell growth. This medicine is used with other cancer treatments. This medicine may be used for other purposes; ask your health care provider or pharmacist if you have questions. COMMON BRAND NAME(S): PERJETA What should I tell my health care provider before I take this medicine? They need to know if you have any of these conditions: -heart disease -heart failure -high blood pressure -history of irregular heart beat -recent or ongoing radiation therapy -an unusual or allergic reaction to pertuzumab, other medicines, foods, dyes, or preservatives -pregnant or trying to get pregnant -breast-feeding How should I use this medicine? This medicine is for infusion into a vein. It is given by a health care professional in a hospital or clinic setting. Talk to your pediatrician regarding the use of this medicine in children. Special care may be needed. Overdosage: If you think you've taken too much of this medicine contact a poison control center or emergency room at once. Overdosage: If you think you have taken too much of this medicine contact a poison control center or emergency room at once. NOTE: This medicine is only for you. Do not share this medicine with others. What if I miss a dose? It is important not to miss your dose. Call your doctor or health care professional if you are unable to keep an appointment. What may interact with this medicine? Interactions are not expected. Give your health care provider a list of all the medicines, herbs, non-prescription drugs, or dietary supplements you use. Also tell them if you smoke, drink alcohol, or use illegal drugs. Some items may interact with your medicine. This list may not describe all possible interactions. Give your health care provider a  list of all the medicines, herbs, non-prescription drugs, or dietary supplements you use. Also tell them if you smoke, drink alcohol, or use illegal drugs. Some items may interact with your medicine. What should I watch for while using this medicine? Your condition will be monitored carefully while you are receiving this medicine. Report any side effects. Continue your course of treatment even though you feel ill unless your doctor tells you to stop. Do not become pregnant while taking this medicine. Women should inform their doctor if they wish to become pregnant or think they might be pregnant. There is a potential for serious side effects to an unborn child. Talk to your health care professional or pharmacist for more information. Do not breast-feed an infant while taking this medicine. Call your doctor or health care professional for advice if you get a fever, chills or sore throat, or other symptoms of a cold or flu. Do not treat yourself. Try to avoid being around people who are sick. You may experience fever, chills, and headache during the infusion. Report any side effects during the infusion to your health care professional. What side effects may I notice from receiving this medicine? Side effects that you should report to your doctor or health care professional as soon as possible: -breathing problems -chest pain or palpitations -dizziness -feeling faint or lightheaded -fever or chills -skin rash, itching or hives -sore throat -swelling of the face, lips, or tongue -swelling of the legs or ankles -unusually weak or tired Side effects that usually do not require medical attention (Report these to your doctor or health care professional if they continue or  are bothersome.): -diarrhea -hair loss -nausea, vomiting -tiredness This list may not describe all possible side effects. Call your doctor for medical advice about side effects. You may report side effects to FDA at  1-800-FDA-1088. Where should I keep my medicine? This drug is given in a hospital or clinic and will not be stored at home. NOTE: This sheet is a summary. It may not cover all possible information. If you have questions about this medicine, talk to your doctor, pharmacist, or health care provider.  2015, Elsevier/Gold Standard. (2012-08-17 16:54:15)

## 2014-06-28 NOTE — Telephone Encounter (Signed)
per pof to sch pt appt-sch pt mamma-gave pt copy of sch

## 2014-06-28 NOTE — Assessment & Plan Note (Signed)
Right breast cancer T1 A. N0 M0 stage IA diagnosed in 2006 treated with lumpectomy and radiation followed by antiestrogen therapy with Aromasin 2006-2011. She is currently being watched with annual mammograms. Her last mammogram in January 2015 was normal. Today's breast exam did not reveal any lumps nodules or abnormalities.  Survivorship: I encouraged her to do daily exercise especially walking for 30 minutes 6 days a week.  Return to clinic in January after undergoing another mammogram.

## 2014-08-20 ENCOUNTER — Ambulatory Visit: Payer: Medicare Other | Admitting: Gastroenterology

## 2014-08-22 ENCOUNTER — Encounter: Payer: Self-pay | Admitting: Gastroenterology

## 2014-08-22 ENCOUNTER — Ambulatory Visit: Payer: Medicare Other | Admitting: Gastroenterology

## 2014-08-22 ENCOUNTER — Ambulatory Visit (INDEPENDENT_AMBULATORY_CARE_PROVIDER_SITE_OTHER): Payer: Medicare HMO | Admitting: Gastroenterology

## 2014-08-22 VITALS — BP 136/82 | HR 72 | Ht 62.0 in | Wt 152.4 lb

## 2014-08-22 DIAGNOSIS — K5900 Constipation, unspecified: Secondary | ICD-10-CM

## 2014-08-22 MED ORDER — POLYETHYLENE GLYCOL 3350 17 GM/SCOOP PO POWD: ORAL | Status: DC

## 2014-08-22 MED ORDER — DOCUSATE SODIUM 100 MG PO CAPS: 100.0000 mg | ORAL_CAPSULE | Freq: Every day | ORAL | Status: DC

## 2014-08-22 NOTE — Patient Instructions (Signed)
Use Colace 100mg  daily Use Miralax 17g in water or juice every 3 days  Call back in 2 weeks if not improved

## 2014-08-22 NOTE — Assessment & Plan Note (Signed)
Patient has chronic idiopathic constipation.  Recommendations #1 Colace 100 mg a day #2 MiraLax one packet every 3 days  Patient was instructed to call back in 2 weeks if not improved at which point I will increase the frequency of MiraLax

## 2014-08-22 NOTE — Progress Notes (Signed)
_                                                                                                                History of Present Illness:  Natalie West is a pleasant 80 year old white female referred for evaluation of constipation.  This is been a chronic problem lasting years but worsening.  She may only move her bowels every few days.  With a bowel movement she has severe straining and intermittent sharp lower abdominal pain.  She has had nausea and occasional vomiting when she tries to move her bowels.  She denies rectal bleeding.  Colonoscopy in 2013 was normal   Past Medical History  Diagnosis Date  . Hypercholesterolemia   . Esophageal reflux   . Depression   . Anxiety   . Chronic UTI   . Breast cancer   . Esophageal stricture   . Personal history of colonic polyps 02/09/2007    TUBULAR ADENOMA  . Family history of malignant neoplasm of gastrointestinal tract   . PONV (postoperative nausea and vomiting)   . Chronic cough   . Rotator cuff tear     RT  . Osteopenia   . Arthritis   . Back pain, chronic     WITH MOTION  . Hearing loss   . Status post dilation of esophageal narrowing   . GERD (gastroesophageal reflux disease)   . Urinary tract infection    Past Surgical History  Procedure Laterality Date  . Breast lumpectomy      right  . Umbilical hernia repair    . Dilation and curettage of uterus    . Colonoscopy    . Polypectomy    . Shoulder open rotator cuff repair Right 02/28/2013    Procedure: RIGHT ROTATOR CUFF REPAIR SHOULDER OPEN WITH GRAFT AND ANCHORS;  Surgeon: Tobi Bastos, MD;  Location: [facility];  Service: Orthopedics;  Laterality: Right;   family history includes Clotting disorder in her son; Colon cancer (age of onset: 54) in her mother; Heart disease in her sister; Kidney cancer in her mother; Lung cancer in her brother; Stomach cancer in her brother; Stroke in her sister. There is no history of Colon polyps. Current Outpatient  Prescriptions  Medication Sig Dispense Refill  . Calcium Carbonate-Vitamin D (CALCIUM 600 + D PO) Take 1 capsule by mouth daily.      Marland Kitchen HYDROmorphone (DILAUDID) 2 MG tablet Take 1 tablet (2 mg total) by mouth every 4 (four) hours as needed.  60 tablet  0  . methocarbamol (ROBAXIN) 500 MG tablet Take 1 tablet (500 mg total) by mouth every 6 (six) hours as needed.  40 tablet  1  . Multiple Vitamins-Minerals (CENTRUM SILVER PO) Take 1 tablet by mouth daily.      . nitrofurantoin (MACRODANTIN) 100 MG capsule Take 100 mg by mouth 2 (two) times daily.      Marland Kitchen omeprazole (PRILOSEC) 20 MG capsule Take 20 mg by mouth as needed.       Marland Kitchen  perphenazine-amitriptyline (ETRAFON/TRIAVIL) 2-10 MG TABS Take 1 tablet by mouth 2 (two) times daily.      . phenazopyridine (PYRIDIUM) 100 MG tablet Take 100 mg by mouth 3 (three) times daily as needed for pain.      . pravastatin (PRAVACHOL) 20 MG tablet Take 20 mg by mouth every evening.        No current facility-administered medications for this visit.   Allergies as of 08/22/2014 - Review Complete 08/22/2014  Allergen Reaction Noted  . Ciprofloxacin  06/26/2011  . Clindamycin/lincomycin  06/26/2011  . Codeine Nausea And Vomiting 01/13/2012  . Diclofenac  06/26/2011  . Fenoprofen calcium  06/26/2011  . Flexeril [cyclobenzaprine hcl]  06/26/2011  . Noroxin [norfloxacin]  02/21/2013  . Relafen [nabumetone]  02/21/2013  . Sulfa antibiotics  06/26/2011    reports that she has never smoked. She has never used smokeless tobacco. She reports that she does not drink alcohol or use illicit drugs.   Review of Systems: She complains of urinary frequency and burning.  She has a UTI Pertinent positive and negative review of systems were noted in the above HPI section. All other review of systems were otherwise negative.  Vital signs were reviewed in today's medical record Physical Exam: General: Elderly female in no acute distress Skin: anicteric Head: Normocephalic  and atraumatic Eyes:  sclerae anicteric, EOMI Ears: Normal auditory acuity Mouth: No deformity or lesions Neck: Supple, no masses or thyromegaly Lungs: Clear throughout to auscultation Heart: Regular rate and rhythm; no murmurs, rubs or bruits Abdomen: Soft, non tender and non distended. No masses, hepatosplenomegaly or hernias noted. Normal Bowel sounds Rectal:deferred Musculoskeletal: Symmetrical with no gross deformities  Skin: No lesions on visible extremities Pulses:  Normal pulses noted Extremities: No clubbing, cyanosis, edema or deformities noted Neurological: Alert oriented x 4, grossly nonfocal Cervical Nodes:  No significant cervical adenopathy Inguinal Nodes: No significant inguinal adenopathy Psychological:  Alert and cooperative. Normal mood and affect  See Assessment and Plan under Problem List

## 2014-11-05 ENCOUNTER — Ambulatory Visit
Admission: RE | Admit: 2014-11-05 | Discharge: 2014-11-05 | Disposition: A | Discharge status: Home / Self Care | Payer: PPO | Source: Ambulatory Visit | Attending: Hematology and Oncology | Admitting: Hematology and Oncology

## 2014-11-05 DIAGNOSIS — Z853 Personal history of malignant neoplasm of breast: Secondary | ICD-10-CM

## 2014-11-05 DIAGNOSIS — Z1231 Encounter for screening mammogram for malignant neoplasm of breast: Secondary | ICD-10-CM

## 2014-11-12 ENCOUNTER — Ambulatory Visit (HOSPITAL_BASED_OUTPATIENT_CLINIC_OR_DEPARTMENT_OTHER): Payer: PPO | Admitting: Hematology and Oncology

## 2014-11-12 ENCOUNTER — Telehealth: Payer: Self-pay | Admitting: Hematology and Oncology

## 2014-11-12 DIAGNOSIS — Z853 Personal history of malignant neoplasm of breast: Secondary | ICD-10-CM

## 2014-11-12 DIAGNOSIS — C50911 Malignant neoplasm of unspecified site of right female breast: Secondary | ICD-10-CM

## 2014-11-12 NOTE — Assessment & Plan Note (Signed)
Right breast cancer T1 A. N0 M0 stage IA diagnosed in 2006 treated with lumpectomy and radiation followed by antiestrogen therapy with Aromasin 2006-2011. She is currently being watched with annual mammograms. Her last mammogram in January 2016 is normal.   Aromasin toxicities: Occasional hot flashes and muscle aches and pains. Breast cancer surveillance: Mammogram done 11/05/2014 is normal breast exam done October 2015 is normal Return to clinic in 6 months for follow-up

## 2014-11-12 NOTE — Progress Notes (Signed)
Patient Care Team: Christain Sacramento, MD as PCP - General (Family Medicine)  DIAGNOSIS: Breast cancer, right breast   Staging form: Breast, AJCC 7th Edition     Clinical: T1a, N0, cM0 - Unsigned     Pathologic: Stage IA (T1a, N0, cM0) - Signed by Rulon Eisenmenger, MD on 06/28/2014   SUMMARY OF ONCOLOGIC HISTORY:   Breast cancer, right breast   11/10/2004 Surgery Right breast lumpectomy invasive ductal carcinoma grade 1; 0.5 cm 2 sentinel lymph nodes negative ER 85% PR 0% Ki-67 19% HER-2 2+ by IHC and negative by fish, no lymphovascular invasion T1, N0, M0 stage IA   12/29/2004 - 01/26/2005 Radiation Therapy Radiation therapy to lumpectomy site   02/26/2005 - 02/26/2010 Anti-estrogen oral therapy Aromasin 25 mg by mouth daily: Side effects including depression.    CHIEF COMPLIANT: follow-up on Aromasin  INTERVAL HISTORY: Natalie West is a 81 year old lady with above-mentioned history of right-sided breast cancer treated with lumpectomy and radiation and is currently on Aromasin.she is tolerating it extremely well without any major problems or concerns. She had a side effect which primarily include depression. Denies any new lumps or nodules in the breasts.  REVIEW OF SYSTEMS:   Constitutional: Denies fevers, chills or abnormal weight loss Eyes: Denies blurriness of vision Ears, nose, mouth, throat, and face: Denies mucositis or sore throat Respiratory: Denies cough, dyspnea or wheezes Cardiovascular: Denies palpitation, chest discomfort or lower extremity swelling Gastrointestinal:  Denies nausea, heartburn or change in bowel habits Skin: Denies abnormal skin rashes Lymphatics: Denies new lymphadenopathy or easy bruising Neurological:Denies numbness, tingling or new weaknesses Behavioral/Psych: Mood is stable, no new changes  Breast:  denies any pain or lumps or nodules in either breasts All other systems were reviewed with the patient and are negative.  I have reviewed the past medical history,  past surgical history, social history and family history with the patient and they are unchanged from previous note.  ALLERGIES:  is allergic to ciprofloxacin; clindamycin/lincomycin; codeine; diclofenac; fenoprofen calcium; flexeril; noroxin; relafen; and sulfa antibiotics.  MEDICATIONS:  Current Outpatient Prescriptions  Medication Sig Dispense Refill  . Calcium Carbonate-Vitamin D (CALCIUM 600 + D PO) Take 1 capsule by mouth daily.    Marland Kitchen docusate sodium (COLACE) 100 MG capsule Take 1 capsule (100 mg total) by mouth daily. 30 capsule 0  . fluticasone (FLONASE) 50 MCG/ACT nasal spray Place 1 spray into both nostrils 2 (two) times daily.  2  . gabapentin (NEURONTIN) 100 MG capsule   2  . meloxicam (MOBIC) 15 MG tablet   2  . Multiple Vitamins-Minerals (CENTRUM SILVER PO) Take 1 tablet by mouth daily.    Marland Kitchen omeprazole (PRILOSEC) 20 MG capsule Take 20 mg by mouth as needed.     Marland Kitchen perphenazine-amitriptyline (ETRAFON/TRIAVIL) 2-10 MG TABS Take 1 tablet by mouth 2 (two) times daily.    . phenazopyridine (PYRIDIUM) 100 MG tablet Take 100 mg by mouth 3 (three) times daily as needed for pain.    . polyethylene glycol powder (GLYCOLAX/MIRALAX) powder Use 17g in 8oz juice or water every 3 days for constipation 255 g 3  . pravastatin (PRAVACHOL) 20 MG tablet Take 20 mg by mouth every evening.      No current facility-administered medications for this visit.    PHYSICAL EXAMINATION: ECOG PERFORMANCE STATUS: 1 - Symptomatic but completely ambulatory  Filed Vitals:   11/12/14 1121  BP: 144/98  Pulse: 71  Temp: 98.4 F (36.9 C)  Resp: 18   Filed  Weights   11/12/14 1121  Weight: 157 lb 3.2 oz (71.305 kg)    GENERAL:alert, no distress and comfortable SKIN: skin color, texture, turgor are normal, no rashes or significant lesions EYES: normal, Conjunctiva are pink and non-injected, sclera clear OROPHARYNX:no exudate, no erythema and lips, buccal mucosa, and tongue normal  NECK: supple, thyroid  normal size, non-tender, without nodularity LYMPH:  no palpable lymphadenopathy in the cervical, axillary or inguinal LUNGS: clear to auscultation and percussion with normal breathing effort HEART: regular rate & rhythm and no murmurs and no lower extremity edema ABDOMEN:abdomen soft, non-tender and normal bowel sounds Musculoskeletal:no cyanosis of digits and no clubbing  NEURO: alert & oriented x 3 with fluent speech, no focal motor/sensory deficits BREAST: No palpable masses or nodules in either right or left breasts. No palpable axillary supraclavicular or infraclavicular adenopathy no breast tenderness or nipple discharge.   LABORATORY DATA:  I have reviewed the data as listed   Chemistry      Component Value Date/Time   NA 142 06/28/2014 1400   NA 140 02/28/2013 0802   NA 142 03/04/2010 0919   K 3.9 06/28/2014 1400   K 3.6 02/28/2013 0802   K 4.1 03/04/2010 0919   CL 109* 03/31/2013 0917   CL 105 02/28/2013 0802   CL 109* 03/04/2010 0919   CO2 25 06/28/2014 1400   CO2 26 02/28/2013 0802   CO2 29 03/04/2010 0919   BUN 16.0 06/28/2014 1400   BUN 13 02/28/2013 0802   BUN 18 03/04/2010 0919   CREATININE 0.8 06/28/2014 1400   CREATININE 0.87 02/28/2013 0802   CREATININE 0.8 03/04/2010 0919      Component Value Date/Time   CALCIUM 9.1 06/28/2014 1400   CALCIUM 9.5 02/28/2013 0802   CALCIUM 9.4 03/04/2010 0919   ALKPHOS 85 06/28/2014 1400   ALKPHOS 93 02/28/2013 0802   ALKPHOS 90* 03/04/2010 0919   AST 25 06/28/2014 1400   AST 21 02/28/2013 0802   AST 33 03/04/2010 0919   ALT 16 06/28/2014 1400   ALT 11 02/28/2013 0802   ALT 26 03/04/2010 0919   BILITOT 0.47 06/28/2014 1400   BILITOT 0.4 02/28/2013 0802   BILITOT 0.50 03/04/2010 0919       Lab Results  Component Value Date   WBC 6.4 06/28/2014   HGB 13.2 06/28/2014   HCT 41.0 06/28/2014   MCV 95.3 06/28/2014   PLT 184 06/28/2014   NEUTROABS 3.9 06/28/2014     RADIOGRAPHIC STUDIES: I have personally  reviewed the radiology reports and agreed with their findings. Mammogram January 2016 is normal  ASSESSMENT & PLAN:  Breast cancer, right breast Right breast cancer T1 A. N0 M0 stage IA diagnosed in 2006 treated with lumpectomy and radiation followed by antiestrogen therapy with Aromasin 2006-2011. She is currently being watched with annual mammograms. Her last mammogram in January 2016 is normal.   Aromasin toxicities: Occasional hot flashes and muscle aches and pains. Breast cancer surveillance: Mammogram done 11/05/2014 is normal breast exam done October 2015 is normal Return to clinic in 6 months for follow-up   Orders Placed This Encounter  Procedures  . 2D Echocardiogram without contrast    Dr.Bensimhon to read    Standing Status: Future     Number of Occurrences:      Standing Expiration Date: 11/12/2015    Scheduling Instructions:     PROVIDERS If this is NOT for EF then please REASON that  needs COMPLETE STUDY    Order Specific  Question:  Type of Echo    Answer:  Complete    Order Specific Question:  Reason for Exam    Answer:  leg swelling evaluation    Order Specific Question:  Where should this test be performed    Answer:  [facility]  . Lower Extremity Venous Duplex Bilateral    Standing Status: Future     Number of Occurrences:      Standing Expiration Date: 11/12/2015    Order Specific Question:  Laterality    Answer:  Bilateral    Order Specific Question:  Where should this test be performed:    Answer:  [facility]   The patient has a good understanding of the overall plan. she agrees with it. She will call with any problems that may develop before her next visit here.   Rulon Eisenmenger, MD 11/12/2014 4:11 PM

## 2014-11-12 NOTE — Telephone Encounter (Signed)
per pof to sch pt appt-sent pre cert to [facility]-adv pt will call & sch after reply from pre cert

## 2014-11-13 ENCOUNTER — Telehealth: Payer: Self-pay

## 2014-11-13 ENCOUNTER — Ambulatory Visit (HOSPITAL_COMMUNITY)
Admission: RE | Admit: 2014-11-13 | Discharge: 2014-11-13 | Disposition: A | Discharge status: Home / Self Care | Payer: PPO | Source: Ambulatory Visit | Attending: Hematology and Oncology | Admitting: Hematology and Oncology

## 2014-11-13 DIAGNOSIS — M7989 Other specified soft tissue disorders: Secondary | ICD-10-CM | POA: Diagnosis not present

## 2014-11-13 DIAGNOSIS — M79604 Pain in right leg: Secondary | ICD-10-CM | POA: Diagnosis not present

## 2014-11-13 DIAGNOSIS — M79605 Pain in left leg: Secondary | ICD-10-CM | POA: Insufficient documentation

## 2014-11-13 DIAGNOSIS — Z853 Personal history of malignant neoplasm of breast: Secondary | ICD-10-CM | POA: Diagnosis not present

## 2014-11-13 DIAGNOSIS — C50911 Malignant neoplasm of unspecified site of right female breast: Secondary | ICD-10-CM

## 2014-11-13 DIAGNOSIS — R609 Edema, unspecified: Secondary | ICD-10-CM

## 2014-11-13 DIAGNOSIS — M79609 Pain in unspecified limb: Secondary | ICD-10-CM

## 2014-11-13 NOTE — Telephone Encounter (Signed)
L/M for pt to return call to clinic.

## 2014-11-13 NOTE — Progress Notes (Signed)
Bilateral lower extremity venous duplex completed:  No evidence of DVT, superficial thrombosis, or Baker's cyst.   

## 2014-11-13 NOTE — Telephone Encounter (Signed)
Returned pt call - let her know doppler results were fine.  Confirmed clinic would call her with appt for echo.

## 2014-11-14 ENCOUNTER — Encounter (HOSPITAL_COMMUNITY): Payer: PPO

## 2014-11-14 ENCOUNTER — Telehealth: Payer: Self-pay | Admitting: Hematology and Oncology

## 2014-11-14 NOTE — Telephone Encounter (Signed)
per pof to sch ECHO-cld & spoke to pt and gave pt time & date/location

## 2014-11-15 ENCOUNTER — Ambulatory Visit (HOSPITAL_COMMUNITY)
Admission: RE | Admit: 2014-11-15 | Discharge: 2014-11-15 | Disposition: A | Discharge status: Home / Self Care | Payer: PPO | Source: Ambulatory Visit | Attending: Hematology and Oncology | Admitting: Hematology and Oncology

## 2014-11-15 DIAGNOSIS — C50911 Malignant neoplasm of unspecified site of right female breast: Secondary | ICD-10-CM

## 2014-11-15 DIAGNOSIS — I369 Nonrheumatic tricuspid valve disorder, unspecified: Secondary | ICD-10-CM

## 2014-11-15 NOTE — Progress Notes (Signed)
Echocardiogram 2D Echocardiogram has been performed.  Darlina Sicilian M 11/15/2014, 10:47 AM

## 2014-11-19 ENCOUNTER — Telehealth: Payer: Self-pay | Admitting: Hematology and Oncology

## 2014-11-19 NOTE — Telephone Encounter (Signed)
PT came into the clinic stating she was told she had a appoitment with [facility] to review results. Informed pt she was to come back in a year and that results were given to her by phone on 01/12 by RN. Spoke with nurse to verify.

## 2015-01-07 ENCOUNTER — Ambulatory Visit (INDEPENDENT_AMBULATORY_CARE_PROVIDER_SITE_OTHER): Payer: PPO | Admitting: Obstetrics & Gynecology

## 2015-01-07 ENCOUNTER — Encounter: Payer: Self-pay | Admitting: Obstetrics & Gynecology

## 2015-01-07 VITALS — BP 136/84 | HR 68 | Resp 20 | Ht 61.0 in | Wt 154.8 lb

## 2015-01-07 DIAGNOSIS — R3911 Hesitancy of micturition: Secondary | ICD-10-CM | POA: Diagnosis not present

## 2015-01-07 DIAGNOSIS — N8110 Cystocele, unspecified: Secondary | ICD-10-CM | POA: Diagnosis not present

## 2015-01-07 DIAGNOSIS — R3915 Urgency of urination: Secondary | ICD-10-CM | POA: Diagnosis not present

## 2015-01-07 DIAGNOSIS — N390 Urinary tract infection, site not specified: Secondary | ICD-10-CM

## 2015-01-07 DIAGNOSIS — Z124 Encounter for screening for malignant neoplasm of cervix: Secondary | ICD-10-CM | POA: Diagnosis not present

## 2015-01-07 DIAGNOSIS — IMO0002 Reserved for concepts with insufficient information to code with codable children: Secondary | ICD-10-CM

## 2015-01-07 NOTE — Progress Notes (Signed)
81 y.o. G1P1 MarriedCaucasianF here for new patient visit in referral from Dr. Redmond Pulling.  Pt has been feeling a bulge, kind of like an egg at the vagina.  She also reports issues with feeling some urgency and then not being able to start her stream of urine.  Also reports she sometimes feels that her bladder isn't always empty.  No blood in her urine.  No vaginal bleeding.  Reports she's has three UTIs in the last several months.  Reports when she has a UTI she feels increased urgency and frequency.  She denies feeling that way today.    Reports she had one vaginal delivery of a 6# 14 oz child.     Patient's last menstrual period was 11/03/1983.          Sexually active: No.  The current method of family planning is post menopausal status.    Exercising: No.  not regularly Smoker:  no  Health Maintenance: Pap:  ? 10 years ago History of abnormal Pap:  no MMG:  11/05/14-normal-h/o lumpectomy-right breast cancer Colonoscopy:  4/13.  Dr. Deatra Ina.  No future colonoscopy recommended. BMD:   2011-osteopenia-had osteoporosis prior to taking medication TDaP:  07/25/07 Screening Labs: PCP, Hb today: PCP, Urine today: PCP   reports that she has never smoked. She has never used smokeless tobacco. She reports that she does not drink alcohol or use illicit drugs.  Past Medical History  Diagnosis Date  . Hypercholesterolemia   . Esophageal reflux   . Depression   . Anxiety   . Chronic UTI   . Breast cancer 2006    right breast  . Esophageal stricture   . Personal history of colonic polyps 02/09/2007    TUBULAR ADENOMA  . Family history of malignant neoplasm of gastrointestinal tract   . PONV (postoperative nausea and vomiting)   . Chronic cough   . Rotator cuff tear     RT  . Osteopenia   . Arthritis   . Back pain, chronic     WITH MOTION  . Hearing loss   . Status post dilation of esophageal narrowing   . GERD (gastroesophageal reflux disease)   . Urinary tract infection   . Umbilical hernia      Past Surgical History  Procedure Laterality Date  . Breast lumpectomy      right  . Umbilical hernia repair    . Dilation and curettage of uterus    . Colonoscopy    . Polypectomy    . Shoulder open rotator cuff repair Right 02/28/2013    Procedure: RIGHT ROTATOR CUFF REPAIR SHOULDER OPEN WITH GRAFT AND ANCHORS;  Surgeon: Tobi Bastos, MD;  Location: [facility];  Service: Orthopedics;  Laterality: Right;    Current Outpatient Prescriptions  Medication Sig Dispense Refill  . Calcium Carbonate-Vitamin D (CALCIUM 600 + D PO) Take 1 capsule by mouth daily.    Marland Kitchen docusate sodium (COLACE) 100 MG capsule Take 1 capsule (100 mg total) by mouth daily. 30 capsule 0  . fluticasone (FLONASE) 50 MCG/ACT nasal spray Place 1 spray into both nostrils 2 (two) times daily.  2  . gabapentin (NEURONTIN) 100 MG capsule   2  . meloxicam (MOBIC) 15 MG tablet   2  . Multiple Vitamins-Minerals (CENTRUM SILVER PO) Take 1 tablet by mouth daily.    Marland Kitchen omeprazole (PRILOSEC) 20 MG capsule Take 20 mg by mouth as needed.     Marland Kitchen perphenazine-amitriptyline (ETRAFON/TRIAVIL) 2-10 MG TABS Take 1 tablet by  mouth 2 (two) times daily.    . pravastatin (PRAVACHOL) 20 MG tablet Take 20 mg by mouth every evening.     Marland Kitchen amoxicillin (AMOXIL) 500 MG capsule   0  . mupirocin ointment (BACTROBAN) 2 %   0  . phenazopyridine (PYRIDIUM) 100 MG tablet Take 100 mg by mouth 3 (three) times daily as needed for pain.    . polyethylene glycol powder (GLYCOLAX/MIRALAX) powder Use 17g in 8oz juice or water every 3 days for constipation (Patient not taking: Reported on 01/07/2015) 255 g 3   No current facility-administered medications for this visit.    Family History  Problem Relation Age of Onset  . Colon cancer Mother 3  . Kidney cancer Mother   . Heart disease Sister   . Stroke Sister   . Stomach cancer Brother   . Lung cancer Brother   . Clotting disorder Son   . Emphysema Father     smoker  . Hypertension Sister   .  Glaucoma Mother     ROS:  Pertinent items are noted in HPI.  Otherwise, a comprehensive ROS was negative.  Exam:   BP 136/84 mmHg  Pulse 68  Resp 20  Ht 5\' 1"  (1.549 m)  Wt 154 lb 12.8 oz (70.217 kg)  BMI 29.26 kg/m2  LMP 11/03/1983    Height: 5\' 1"  (154.9 cm) (per patient)  Ht Readings from Last 3 Encounters:  01/07/15 5\' 1"  (1.549 m)  11/12/14 5\' 2"  (1.575 m)  08/22/14 5\' 2"  (1.575 m)   General appearance: alert, cooperative and appears stated age Head: Normocephalic, without obvious abnormality, atraumatic Abdomen: soft, non-tender; bowel sounds normal; no masses,  no organomegaly Extremities: extremities normal, atraumatic, no cyanosis or edema Skin: Skin color, texture, turgor normal. No rashes or lesions Lymph nodes: Cervical, supraclavicular, and axillary nodes normal. No abnormal inguinal nodes palpated Neurologic: Grossly normal   Pelvic: External genitalia:  no lesions              Urethra:  normal appearing urethra with no masses, tenderness or lesions              Bartholins and Skenes: normal                 Vagina: normal appearing vagina with normal color and discharge, no lesions, atrophic, 3rd degree cystocele noted              Cervix: no lesions              Pap taken: Yes.   Bimanual Exam:  Uterus:  normal size, contour, position, consistency, mobility, non-tender              Adnexa: normal adnexa, no masses               Rectovaginal: Confirms               Anus:  normal sphincter tone, no lesions  D/W pt options for treatment:  Pelvic PT, surgical correction, and/or pessary use.  Pt interested in pessary use.  Fitting performed today.  #2 incontinence ring placed today without difficulty.  However, this was too small.  Then #3 incontinence ring place with good fit.  Pt could walk easily with this, void without difficulty, and could not valsalva out device.  Fitting pessary removed and new one will be ordered for pt.  Chaperone was present for  exam.  A:   Cystocele Urinary urgency and hesitancy Recurrent UTIs Multiple drug  allergies H/o breast cancer, s/p lumpectomy on right   P:   Pap was obtained today Pessary fitting performed today.  Pt will return when pessary comes in for fitting.  Will then follow along with pt every three to four months for removal and cleaning Urine culture pending.  Suspicious for UTI give current symptoms.

## 2015-01-08 LAB — IPS PAP SMEAR ONLY

## 2015-01-10 ENCOUNTER — Telehealth: Payer: Self-pay | Admitting: Obstetrics & Gynecology

## 2015-01-10 LAB — URINE CULTURE: Colony Count: 70000

## 2015-01-10 NOTE — Telephone Encounter (Signed)
Dr.Miller, please review and advise results from 3/7 OV. Thank you.

## 2015-01-10 NOTE — Telephone Encounter (Signed)
Patient is asking for her urine culture results from her last office visit 01/07/15.

## 2015-01-10 NOTE — Telephone Encounter (Signed)
Recommendations made on lab results.  OK to close encounter.

## 2015-01-11 MED ORDER — NITROFURANTOIN MONOHYD MACRO 100 MG PO CAPS: 100.0000 mg | ORAL_CAPSULE | Freq: Two times a day (BID) | ORAL | Status: DC

## 2015-01-11 NOTE — Telephone Encounter (Signed)
Spoke with patient. Results given as seen below. Patient is agreeable and verbalizes understanding. Macrobid 100mg  #14 0RF sent to [facility] on file. Patient is agreeable.  Notes Recorded by Megan Salon, MD on 01/10/2015 at 5:47 PM 02 pap recall. Urine culture with MRSA. Very unusual bacteria in culture and may be a skin contaminant. Still it is sensitive to Macrobid and this is safe for her so treat with macrobid 100mg  bid x 7 days. OK to send rx to pharmacy.  Will close encounter.

## 2015-01-16 ENCOUNTER — Encounter: Payer: Self-pay | Admitting: Obstetrics and Gynecology

## 2015-01-16 ENCOUNTER — Ambulatory Visit (INDEPENDENT_AMBULATORY_CARE_PROVIDER_SITE_OTHER): Payer: PPO | Admitting: Obstetrics and Gynecology

## 2015-01-16 ENCOUNTER — Telehealth: Payer: Self-pay | Admitting: Obstetrics & Gynecology

## 2015-01-16 VITALS — BP 130/82 | Temp 98.4°F | Ht 61.0 in | Wt 151.0 lb

## 2015-01-16 DIAGNOSIS — N8110 Cystocele, unspecified: Secondary | ICD-10-CM

## 2015-01-16 DIAGNOSIS — N390 Urinary tract infection, site not specified: Secondary | ICD-10-CM

## 2015-01-16 DIAGNOSIS — R319 Hematuria, unspecified: Secondary | ICD-10-CM

## 2015-01-16 DIAGNOSIS — IMO0002 Reserved for concepts with insufficient information to code with codable children: Secondary | ICD-10-CM

## 2015-01-16 LAB — CBC WITH DIFFERENTIAL/PLATELET
Basophils Absolute: 0 10*3/uL (ref 0.0–0.1)
Basophils Relative: 0 % (ref 0–1)
Eosinophils Absolute: 0.1 10*3/uL (ref 0.0–0.7)
Eosinophils Relative: 1 % (ref 0–5)
HCT: 40 % (ref 36.0–46.0)
Hemoglobin: 13.4 g/dL (ref 12.0–15.0)
Lymphocytes Relative: 25 % (ref 12–46)
Lymphs Abs: 1.5 10*3/uL (ref 0.7–4.0)
MCH: 31.3 pg (ref 26.0–34.0)
MCHC: 33.5 g/dL (ref 30.0–36.0)
MCV: 93.5 fL (ref 78.0–100.0)
MPV: 9 fL (ref 8.6–12.4)
Monocytes Absolute: 0.5 10*3/uL (ref 0.1–1.0)
Monocytes Relative: 9 % (ref 3–12)
Neutro Abs: 3.8 10*3/uL (ref 1.7–7.7)
Neutrophils Relative %: 65 % (ref 43–77)
Platelets: 210 10*3/uL (ref 150–400)
RBC: 4.28 MIL/uL (ref 3.87–5.11)
RDW: 12.9 % (ref 11.5–15.5)
WBC: 5.9 10*3/uL (ref 4.0–10.5)

## 2015-01-16 LAB — POCT URINALYSIS DIPSTICK
Urobilinogen, UA: NEGATIVE
pH, UA: 5

## 2015-01-16 LAB — BASIC METABOLIC PANEL
BUN: 18 mg/dL (ref 6–23)
CO2: 22 mEq/L (ref 19–32)
Calcium: 8.7 mg/dL (ref 8.4–10.5)
Chloride: 106 mEq/L (ref 96–112)
Creat: 0.82 mg/dL (ref 0.50–1.10)
Glucose, Bld: 70 mg/dL (ref 70–99)
Potassium: 3.8 mEq/L (ref 3.5–5.3)
Sodium: 142 mEq/L (ref 135–145)

## 2015-01-16 MED ORDER — DOXYCYCLINE HYCLATE 100 MG PO CAPS: ORAL_CAPSULE | ORAL | Status: DC

## 2015-01-16 NOTE — Telephone Encounter (Signed)
Pt states shes being fitted for a pessary soon and is experiencing terrible nausea, pain with urniation, terrible headaches. She states she's worried about her bladder dropping and that the nausea and headaches are the most troublesome issue.

## 2015-01-16 NOTE — Telephone Encounter (Signed)
Have patient come in for evaluation today.  May need to do catheter specimen of urine and switch to Doxycycline.

## 2015-01-16 NOTE — Telephone Encounter (Signed)
Spoke with patient. Advised patient of message as seen below from [place]. Patient is agreeable. Appointment scheduled for today at 2pm with Dr.Silva. Patient is aware this is a work in appointment.  Routing to provider for final review. Patient agreeable to disposition. Will close encounter

## 2015-01-16 NOTE — Telephone Encounter (Signed)
Spoke with patient. Patient states that she began to have "extreme" pain with urination on Saturday. "The pain has gotten a little better but I still hurt when I use the bathroom. Over the last couple of days I have been really nauseous. I have not thrown up but there has been a lot of heaving. I have a terrible headache that I can not get rid of." Denies any lower back pain, chills, urinary frequency, and flu or cold symptoms. "I can feel my bladder. I am supposed to have a pessary fitting soon." Patient was last seen on 01/07/15 with Dr.Miller. Currently being treated with Macrobid 100mg  bid for 7 days for MRSA UTI. Advised will need to speak with Dr.Silva and return call with further recommendations. Patient is agreeable.

## 2015-01-16 NOTE — Progress Notes (Signed)
GYNECOLOGY  VISIT   HPI: 81 y.o.   Married  Caucasian  female   G1P1 with Patient's last menstrual period was 11/03/1983.   here for recheck of MRSA bladder infection.  Having dysuria and frequency and difficulty voiding. Started Macrobid on 01/11/15 for which there is sensitivity testing on urine culture.  Noted nausea and headache last hs. No vomiting.  Headache in now better after putting heat on head.  No fevers.  No worsening of usual back pain.   No blood in urine.  Took pyridium with a prior infection.  Had 3 bladder infections in the last 6 months.   GYNECOLOGIC HISTORY: Patient's last menstrual period was 11/03/1983. Contraception:  Post Menopausal   Menopausal hormone therapy: None        OB History    Gravida Para Term Preterm AB TAB SAB Ectopic Multiple Living   1 1        1          Patient Active Problem List   Diagnosis Date Noted  . Constipation 08/22/2014  . Rotator cuff (capsule) sprain 02/28/2013  . Breast cancer, right breast   . Internal hemorrhoids 06/28/2011  . Gastroenteritis 06/28/2011  . HYPERLIPIDEMIA 02/18/2008  . GERD 02/18/2008  . HIATAL HERNIA 02/18/2008  . OSTEOARTHRITIS 02/18/2008  . COLONIC POLYPS, ADENOMATOUS 02/09/2007  . DIVERTICULITIS, ACUTE 02/09/2007  . ESOPHAGEAL STRICTURE 11/19/1998    Past Medical History  Diagnosis Date  . Hypercholesterolemia   . Esophageal reflux   . Depression   . Anxiety   . Chronic UTI   . Breast cancer 2006    right breast  . Esophageal stricture   . Personal history of colonic polyps 02/09/2007    TUBULAR ADENOMA  . Family history of malignant neoplasm of gastrointestinal tract   . PONV (postoperative nausea and vomiting)   . Chronic cough   . Rotator cuff tear     RT  . Osteopenia   . Arthritis   . Back pain, chronic     WITH MOTION  . Hearing loss   . Status post dilation of esophageal narrowing   . GERD (gastroesophageal reflux disease)   . Urinary tract infection   . Umbilical  hernia     Past Surgical History  Procedure Laterality Date  . Breast lumpectomy      right  . Umbilical hernia repair    . Dilation and curettage of uterus    . Colonoscopy    . Shoulder open rotator cuff repair Right 02/28/2013    Procedure: RIGHT ROTATOR CUFF REPAIR SHOULDER OPEN WITH GRAFT AND ANCHORS;  Surgeon: Tobi Bastos, MD;  Location: [facility];  Service: Orthopedics;  Laterality: Right;    Current Outpatient Prescriptions  Medication Sig Dispense Refill  . Calcium Carbonate-Vitamin D (CALCIUM 600 + D PO) Take 1 capsule by mouth daily.    Marland Kitchen docusate sodium (COLACE) 100 MG capsule Take 1 capsule (100 mg total) by mouth daily. 30 capsule 0  . fluticasone (FLONASE) 50 MCG/ACT nasal spray Place 1 spray into both nostrils 2 (two) times daily.  2  . gabapentin (NEURONTIN) 100 MG capsule   2  . meloxicam (MOBIC) 15 MG tablet   2  . Multiple Vitamins-Minerals (CENTRUM SILVER PO) Take 1 tablet by mouth daily.    . mupirocin ointment (BACTROBAN) 2 %   0  . nitrofurantoin, macrocrystal-monohydrate, (MACROBID) 100 MG capsule Take 1 capsule (100 mg total) by mouth 2 (two) times daily. 14 capsule 0  .  omeprazole (PRILOSEC) 20 MG capsule Take 20 mg by mouth as needed.     Marland Kitchen perphenazine-amitriptyline (ETRAFON/TRIAVIL) 2-10 MG TABS Take 1 tablet by mouth 2 (two) times daily.    . phenazopyridine (PYRIDIUM) 100 MG tablet Take 100 mg by mouth 3 (three) times daily as needed for pain.    . pravastatin (PRAVACHOL) 20 MG tablet Take 20 mg by mouth every evening.      No current facility-administered medications for this visit.     ALLERGIES: Ciprofloxacin; Clindamycin/lincomycin; Codeine; Diclofenac; Fenoprofen calcium; Flexeril; Noroxin; Relafen; and Sulfa antibiotics  Family History  Problem Relation Age of Onset  . Colon cancer Mother 29  . Kidney cancer Mother   . Heart disease Sister   . Stroke Sister   . Stomach cancer Brother   . Lung cancer Brother   . Clotting disorder Son    . Emphysema Father     smoker  . Hypertension Sister   . Glaucoma Mother     History   Social History  . Marital Status: Married    Spouse Name: N/A  . Number of Children: 1  . Years of Education: N/A   Occupational History  . retired    Social History Main Topics  . Smoking status: Never Smoker   . Smokeless tobacco: Never Used  . Alcohol Use: No  . Drug Use: No  . Sexual Activity: No   Other Topics Concern  . Not on file   Social History Narrative    ROS:  Pertinent items are noted in HPI.  PHYSICAL EXAMINATION:    BP 130/82 mmHg  Temp(Src) 98.4 F (36.9 C) (Oral)  Ht 5' 1"  (1.549 m)  Wt 151 lb (68.493 kg)  BMI 28.55 kg/m2  LMP 11/03/1983     General appearance: alert, cooperative and appears stated age Lungs: clear to auscultation bilaterally Heart: regular rate and rhythm Abdomen: soft, non-tender; no masses,  no organomegaly Back: no CVA tenderness.  Pelvic: External genitalia:  no lesions              Urethra:  normal appearing urethra with no masses, tenderness or lesions              Bartholins and Skenes: normal                 Vagina: normal appearing vagina with normal color and discharge, no lesions.  Second degree cystocele.              Cervix: normal appearance                   Bimanual Exam:  Uterus:  uterus is normal size, shape, consistency and nontender                                      Adnexa: normal adnexa in size, nontender and no masses                                      Catheterization of bladder Consent for procedure.  Sterile prep with betadine.  Cath kit used to obtain sterile specimen - 15 cc removed and sent to lab. No complications.  ASSESSMENT  MRSA UTI.  Intolerance to Macrobid versus worsening of infection.  Cystocele. Recurrent UTIs.  PLAN  Stop Macrobid. UC sent.  CBC  with differential, BMP. Will start patient on Doxycycline 100 mg po bid for 1 week.  Take with food and avoid direct  sunshine. Pyridium 100 mg po tid x 2 days. Patient will call office in 2 days to report how she is doing.  Will need a test of cure.  Can have pessary fitted after infection is resolved. Call for fevers, nausea and vomiting, or lack of improvement if dysuria.   An After Visit Summary was printed and given to the patient.  15 minutes face to face time of which over 50% was spent in counseling.

## 2015-01-17 LAB — URINALYSIS, MICROSCOPIC ONLY
Bacteria, UA: NONE SEEN
Casts: NONE SEEN
Crystals: NONE SEEN
Squamous Epithelial / LPF: NONE SEEN

## 2015-01-18 LAB — URINE CULTURE
Colony Count: NO GROWTH
Organism ID, Bacteria: NO GROWTH

## 2015-01-20 ENCOUNTER — Encounter: Payer: Self-pay | Admitting: Obstetrics & Gynecology

## 2015-01-20 DIAGNOSIS — N390 Urinary tract infection, site not specified: Secondary | ICD-10-CM | POA: Insufficient documentation

## 2015-01-20 DIAGNOSIS — IMO0002 Reserved for concepts with insufficient information to code with codable children: Secondary | ICD-10-CM | POA: Insufficient documentation

## 2015-01-23 ENCOUNTER — Telehealth: Payer: Self-pay | Admitting: Obstetrics & Gynecology

## 2015-01-23 NOTE — Telephone Encounter (Signed)
Patient

## 2015-01-23 NOTE — Telephone Encounter (Signed)
Spoke with patient. Patient states that she needs to schedule an appointment to have her urine checked. Patient was seen on 01/16/2015 with Dr.Silva and treated for a UTI with Doxycycline 100mg  bid for one week. Patient completed antibiotic yesterday. TOC appointment scheduled for 3/29 at 2:30pm. Patient is agreeable to date and time.  Routing to provider for final review. Patient agreeable to disposition. Will close encounter

## 2015-01-23 NOTE — Telephone Encounter (Signed)
Patient calling to schedule a pessary fitting with Dr. Sabra Heck.

## 2015-01-24 NOTE — Telephone Encounter (Signed)
Urine test of cure appointment can be cancelled after discussing with patient. Patient has already had a negative follow up urine culture.  The second culture was negative, and the office called her to let her know this.  We were unable to reach her by phone.  Please confirm contact information.

## 2015-01-24 NOTE — Telephone Encounter (Signed)
Spoke with patient. Advised of results as seen below. Patient is agreeable. Patient would like to schedule appointment with Dr.Miller for pessary fitting. Requests afternoon appointment. Appointment scheduled for 02/11/2015 at 1:30pm with Dr.Miller. Patient is agreeable to date and time.   Cc: Dr.Miller  Routing to provider for final review. Patient agreeable to disposition. Will close encounter

## 2015-01-29 ENCOUNTER — Ambulatory Visit: Payer: PPO

## 2015-02-11 ENCOUNTER — Ambulatory Visit (INDEPENDENT_AMBULATORY_CARE_PROVIDER_SITE_OTHER): Payer: PPO | Admitting: Obstetrics & Gynecology

## 2015-02-11 ENCOUNTER — Encounter: Payer: Self-pay | Admitting: Obstetrics & Gynecology

## 2015-02-11 VITALS — BP 118/84 | HR 64 | Resp 16 | Wt 150.6 lb

## 2015-02-11 DIAGNOSIS — IMO0002 Reserved for concepts with insufficient information to code with codable children: Secondary | ICD-10-CM

## 2015-02-11 DIAGNOSIS — N8110 Cystocele, unspecified: Secondary | ICD-10-CM

## 2015-02-11 NOTE — Progress Notes (Signed)
81 y.o. MarriedWhite female G1P1 here for pessary placement.  Pessary is not in office.  Appt made by triage RN before confirmation that pessary was in office.  Pt will be rescheduled.

## 2015-02-18 ENCOUNTER — Ambulatory Visit (INDEPENDENT_AMBULATORY_CARE_PROVIDER_SITE_OTHER): Payer: PPO | Admitting: Obstetrics & Gynecology

## 2015-02-18 ENCOUNTER — Encounter: Payer: Self-pay | Admitting: Obstetrics & Gynecology

## 2015-02-18 VITALS — BP 126/82 | HR 84 | Resp 20 | Wt 151.0 lb

## 2015-02-18 DIAGNOSIS — R3915 Urgency of urination: Secondary | ICD-10-CM

## 2015-02-18 DIAGNOSIS — N8110 Cystocele, unspecified: Secondary | ICD-10-CM | POA: Diagnosis not present

## 2015-02-18 DIAGNOSIS — IMO0002 Reserved for concepts with insufficient information to code with codable children: Secondary | ICD-10-CM

## 2015-02-18 LAB — POCT URINALYSIS DIPSTICK
Bilirubin, UA: NEGATIVE
Blood, UA: NEGATIVE
Glucose, UA: NEGATIVE
Ketones, UA: NEGATIVE
Nitrite, UA: NEGATIVE
Protein, UA: NEGATIVE
Urobilinogen, UA: NEGATIVE
pH, UA: 7

## 2015-02-18 NOTE — Addendum Note (Signed)
Addended by: Megan Salon on: 02/18/2015 04:27 PM   Modules accepted: Orders, Level of Service

## 2015-02-18 NOTE — Progress Notes (Addendum)
81 y.o. Married White female G1P1 here for pessary placement.  Pessary fitting was done at visit 01/07/15.  Here for placement of #3 incontinence ring with support.   Patient is not sexually active as husband has terminal illness. Urine culture has been performed.  Results:  MRSA+.  Was treated and follow up culture on 01/16/15 was negative.  Pt reports she is feeling increased urgency again, like when she was here in early March.  No actual dysuria.  No fever.  No back pain.  No blood in urine.  Exam: BP 126/82 mmHg  Pulse 84  Resp 20  Wt 151 lb (68.493 kg)  LMP 11/03/1983   General appearance: alert and cooperative Inguinal adenopathy: No LAD   Pelvic: External genitalia:  no lesions              Urethra: not indicated and normal appearing urethra with no masses, tenderness or lesions              Bartholins and Skenes: Bartholin's, Urethra, Skene's normal                 Vagina: normal appearing vagina with normal color and discharge, no lesions, atrophic              Cervix: normal appearance  Procedure:  #3 incontinence ring placed without difficulty.  Pt ambulated easily and voided without difficulty.  Assessment:   Cystocele- symptomatic Increased urinary urgency  Plan: Follow up 4 months Urine culture pending

## 2015-02-19 LAB — URINE CULTURE
Colony Count: NO GROWTH
Organism ID, Bacteria: NO GROWTH

## 2015-02-25 ENCOUNTER — Ambulatory Visit (INDEPENDENT_AMBULATORY_CARE_PROVIDER_SITE_OTHER): Payer: PPO | Admitting: Obstetrics & Gynecology

## 2015-02-25 ENCOUNTER — Telehealth: Payer: Self-pay | Admitting: Obstetrics & Gynecology

## 2015-02-25 VITALS — BP 112/78 | HR 72 | Resp 20 | Wt 151.2 lb

## 2015-02-25 DIAGNOSIS — N368 Other specified disorders of urethra: Secondary | ICD-10-CM

## 2015-02-25 NOTE — Telephone Encounter (Signed)
Spoke with patient. Patient states that she has noticed "Orange streaks" when using the restroom for a couple of days. Last night noticed blood in her underwear when she went to the restroom. Denies any abdominal discomfort. Is still have urinary urgency. Denies any burning with urination, fevers, chills, or lower back pain. Advised will need to be seen for further evaluation in office with Dr.Miller to check placement of her pessary. Patient is agreeable. Appointment scheduled for today at 3pm with Dr.Miller. Agreeable to date and time.  Routing to provider for final review. Patient agreeable to disposition. Will close encounter

## 2015-02-25 NOTE — Telephone Encounter (Signed)
Patient has a pessary and is now spotting blood.

## 2015-02-26 ENCOUNTER — Encounter: Payer: Self-pay | Admitting: Obstetrics & Gynecology

## 2015-02-26 DIAGNOSIS — N368 Other specified disorders of urethra: Secondary | ICD-10-CM | POA: Insufficient documentation

## 2015-02-26 NOTE — Progress Notes (Signed)
Subjective:     Patient ID: Natalie West, female   DOB: 1931/10/31, 81 y.o.   MRN: [id number]  HPI 81 yo G1P1 MWF with h/o cystocele here for complaint of BRVB that started yesterday.  #3 incontinence ring without support was placed 02/18/15.  She reports her urine stream has been really strong since the placement.  She does feel it a little when she sits.  She states she has had a little bit of orange-ish streaking with wiping until last night when she has the bright red bleeding.  She also reports feeling like there was a bulge that is coming around the pessary as well.  No back pain.  No fevers.  She is having some increased incontinence since the pessary placement.    Review of Systems  All other systems reviewed and are negative.      Objective:   Physical Exam  Constitutional: She appears well-developed and well-nourished.  Genitourinary: Vagina normal and uterus normal.    There is no rash, tenderness, lesion or injury on the right labia. There is no rash, tenderness, lesion or injury on the left labia. Cervix exhibits no motion tenderness (but stenotic). Right adnexum displays no mass and no tenderness. Left adnexum displays no mass and no tenderness. No bleeding in the vagina. No vaginal discharge found.  Lymphadenopathy:       Right: No inguinal adenopathy present.       Left: No inguinal adenopathy present.  Skin: Skin is warm and dry.  Psychiatric: She has a normal mood and affect.   Due to significant change in prolapsed urethra, do not feel the pessary should be replaced.  Before allowing pt to leave, she was able to void.       Assessment:     Significant change in urethral prolapse with pessary (that has only been in place one week) so pessary removed Cystocele    Plan:     Will plan to recheck in two weeks.  Pt with h/o breast cancer so declines any estrogen use.  Feel a little would be helpful but she declines.  Pt aware if has any symptoms or urinary retention, she is  to go to the ER immediately at night/weekends or call office to be seen urgently.

## 2015-03-04 ENCOUNTER — Telehealth: Payer: Self-pay | Admitting: Obstetrics & Gynecology

## 2015-03-04 NOTE — Telephone Encounter (Signed)
Left message for patient to return call to office to discuss referral to [facility].Patient was seen on 02/25/2015 with Dr.Miller for urethral prolapse. Patient calling to let Dr.Miller know she would like to see Dr.Tannenbaum or Dr.Dahlstedt at [facility]. Dr.Miller, okay to place referral at this time for patient?

## 2015-03-04 NOTE — Telephone Encounter (Signed)
Patient calling stating she remembered who she would like to see at [facility]: 1. Dr. Jake Seats or  2. Dr. Vivien Presto.

## 2015-03-04 NOTE — Telephone Encounter (Addendum)
Spoke with patient. Patient states that she has been seen with [facility] before but was not sure if she needed anything further to get an appointment. Advised I will call to speak with their office for what all is needed to set up an appointment for her. Patient would like their office to contact her directly to schedule. "I have not ordered that girdle yet. I am not sure if I need to see the urologist first or not." Advised will speak with Dr.Miller and return call. Patient is agreeable. Attempted to reach [facility] to discuss scheduling for patient but there was no answer. Will try again tomorrow as practice phones may be off for the day.

## 2015-03-05 NOTE — Telephone Encounter (Signed)
Spoke with [facility] who states patient has only ever seen Dr.Nesi at their practice. First available appointment wit June 7th at 1:15pm. Advised will let patient know and if provider needs to be changed will do so. OV notes from 4/25 with cover sheet faxed to [facility] at [phone].

## 2015-03-05 NOTE — Telephone Encounter (Signed)
It is fine for her to return to [facility].  I am going to recheck her urethral prolapse in another week or so.  It will probably regress with the pessary out.  Ok to go ahead and order the device Dr. Quincy Simmonds gave her information about when here in office as her only other option is surgery and I think it is worth trying something conservative first.

## 2015-03-05 NOTE — Telephone Encounter (Signed)
OK to see urology first.  Probably should sent last office note when appt is made.  Thanks.

## 2015-03-05 NOTE — Telephone Encounter (Signed)
Spoke with patient. Advised of appointment date and time with Dr.Nessi as seen below. Patient prefers to see Dr.Dahlstedt. Advised I will call to see if I can her get in for an earlier appointment with him. Spoke with [facility] Dr.Dahlstedt's first available appointment is July 8th. Advised to keep appointment with Dr.Nesi as July will be too far out and I will notify the patient. Spoke with patient and advised of first available with Dr.Dahlstedt and that appointment kept with Dr.Nesi on June 7th. Patient is agreeable. Advised will speak with Dr.Miller and return call if anything is recommended prior to that appointment. Patient is agreeable. Does have follow up appointment scheduled with Dr.Miller for 5/13.

## 2015-03-06 NOTE — Telephone Encounter (Signed)
Spoke with patient. Advised of message as seen below from [place]. Patient is agreeable and verbalizes understanding. Patient will also call to order FemBrace at this time as recommended by Dr.Silva. Will return call if she needs any additional help.  Routing to provider for final review. Patient agreeable to disposition. Patient aware MD will review message and nurse will return call with any additional instructions or change of disposition. Will close encounter.

## 2015-03-14 ENCOUNTER — Encounter: Payer: PPO | Admitting: Obstetrics & Gynecology

## 2015-03-14 ENCOUNTER — Telehealth: Payer: Self-pay | Admitting: Obstetrics & Gynecology

## 2015-03-14 ENCOUNTER — Ambulatory Visit: Payer: PPO | Admitting: Obstetrics & Gynecology

## 2015-03-14 NOTE — Telephone Encounter (Signed)
Called and spoke with patient to see if she could move her appointment from 03/15/15 with Dr. Sabra Heck to today at 4:00, arriving at 3:45. She will check her schedule and call back if she can come today.

## 2015-03-14 NOTE — Telephone Encounter (Signed)
Patient cannot mover her appointment to today. Please call.

## 2015-03-15 ENCOUNTER — Ambulatory Visit: Payer: PPO | Admitting: Obstetrics & Gynecology

## 2015-03-18 ENCOUNTER — Ambulatory Visit (INDEPENDENT_AMBULATORY_CARE_PROVIDER_SITE_OTHER): Payer: PPO | Admitting: Obstetrics & Gynecology

## 2015-03-18 VITALS — BP 120/88 | HR 64 | Resp 25 | Wt 149.2 lb

## 2015-03-18 DIAGNOSIS — N8110 Cystocele, unspecified: Secondary | ICD-10-CM

## 2015-03-18 DIAGNOSIS — IMO0002 Reserved for concepts with insufficient information to code with codable children: Secondary | ICD-10-CM

## 2015-03-27 ENCOUNTER — Encounter: Payer: Self-pay | Admitting: Obstetrics & Gynecology

## 2015-03-27 NOTE — Progress Notes (Signed)
Subjective:     Patient ID: AJANE NOVELLA, female   DOB: 06-14-1931, 81 y.o.   MRN: [id number]  HPI 81 yo G1P1 MWF with h/o 4th degree cystocele who has #3 incontinence ring with support placed with improvement in bladder emtpying.  However, after about two weeks she started to have vaginal bleeding which was due to a signficant urethral prolapse that occurs after placement of the pessary.  Pessary was removed and she is here for follow-up.  Reports bleeding resolved in less than 48 hours and she has had no new issues.  She is not voiding as well as she was and now the bladder is prolapsed again into her vaginal and through the vagina.  She is uncomfortable with the prolapse.  Pt also reports that she ordered a vaginal support/underwear--fembrace--but this was uncomfortable and she doesn't think this will work for her.  She does have an appt next with with urology to discuss possible surgical correction as pessary and support have not worked for her.    Review of Systems  All other systems reviewed and are negative.      Objective:   Physical Exam  Constitutional: She appears well-developed and well-nourished.  Genitourinary:    There is no rash, tenderness or lesion on the right labia. There is no rash, tenderness or lesion on the left labia.  4th degree cystocele present.  Skin: Skin is warm and dry.  Psychiatric: She has a normal mood and affect.       Assessment:     Large, symptomatic cystocele     Plan:     Pt is following up with urology next week.  At this time, I do not have any additional recommendations.  Will send Dr. Redmond Pulling last few notes to keep him up to date.

## 2015-06-14 ENCOUNTER — Telehealth: Payer: Self-pay | Admitting: Obstetrics & Gynecology

## 2015-06-14 NOTE — Telephone Encounter (Signed)
Patient canceled her 4 month reck appointment 06/18/15 with Dr.Miller. Patient did not wish to reschedule.

## 2015-06-18 ENCOUNTER — Ambulatory Visit: Payer: PPO | Admitting: Obstetrics & Gynecology

## 2015-06-24 NOTE — Telephone Encounter (Signed)
Dr Sabra Heck, do I need to call patient to reschedule? Please advise.//kn

## 2015-06-24 NOTE — Telephone Encounter (Signed)
Does not need to be rescheduled.  She doesn't have a pessary and did have follow up with urology.  Thanks.

## 2015-08-03 ENCOUNTER — Encounter (HOSPITAL_COMMUNITY): Payer: Self-pay

## 2015-08-03 DIAGNOSIS — S20212A Contusion of left front wall of thorax, initial encounter: Secondary | ICD-10-CM | POA: Insufficient documentation

## 2015-08-03 DIAGNOSIS — W1839XA Other fall on same level, initial encounter: Secondary | ICD-10-CM | POA: Insufficient documentation

## 2015-08-03 DIAGNOSIS — Z87440 Personal history of urinary (tract) infections: Secondary | ICD-10-CM | POA: Insufficient documentation

## 2015-08-03 DIAGNOSIS — F329 Major depressive disorder, single episode, unspecified: Secondary | ICD-10-CM | POA: Diagnosis not present

## 2015-08-03 DIAGNOSIS — K219 Gastro-esophageal reflux disease without esophagitis: Secondary | ICD-10-CM | POA: Insufficient documentation

## 2015-08-03 DIAGNOSIS — Y9389 Activity, other specified: Secondary | ICD-10-CM | POA: Diagnosis not present

## 2015-08-03 DIAGNOSIS — E7800 Pure hypercholesterolemia, unspecified: Secondary | ICD-10-CM | POA: Insufficient documentation

## 2015-08-03 DIAGNOSIS — Y998 Other external cause status: Secondary | ICD-10-CM | POA: Diagnosis not present

## 2015-08-03 DIAGNOSIS — Z79899 Other long term (current) drug therapy: Secondary | ICD-10-CM | POA: Insufficient documentation

## 2015-08-03 DIAGNOSIS — Y9289 Other specified places as the place of occurrence of the external cause: Secondary | ICD-10-CM | POA: Diagnosis not present

## 2015-08-03 DIAGNOSIS — M1990 Unspecified osteoarthritis, unspecified site: Secondary | ICD-10-CM | POA: Diagnosis not present

## 2015-08-03 DIAGNOSIS — H9190 Unspecified hearing loss, unspecified ear: Secondary | ICD-10-CM | POA: Insufficient documentation

## 2015-08-03 DIAGNOSIS — Z853 Personal history of malignant neoplasm of breast: Secondary | ICD-10-CM | POA: Diagnosis not present

## 2015-08-03 DIAGNOSIS — Z86010 Personal history of colonic polyps: Secondary | ICD-10-CM | POA: Insufficient documentation

## 2015-08-03 DIAGNOSIS — S29001A Unspecified injury of muscle and tendon of front wall of thorax, initial encounter: Secondary | ICD-10-CM | POA: Diagnosis present

## 2015-08-03 NOTE — ED Notes (Signed)
Pt here for fall tonight while standing on solid ground. Denies any dizziness when it happened. Denies hitting her head or LOC. States she was standing sideways putting something in the trash and started falling the opposite direction. C/o pain in her left lower back where she hit it on the cement hearth.

## 2015-08-04 ENCOUNTER — Emergency Department (HOSPITAL_COMMUNITY)
Admission: EM | Admit: 2015-08-04 | Discharge: 2015-08-04 | Disposition: A | Discharge status: Home / Self Care | Payer: PPO | Attending: Emergency Medicine | Admitting: Emergency Medicine

## 2015-08-04 ENCOUNTER — Emergency Department (HOSPITAL_COMMUNITY): Payer: PPO

## 2015-08-04 DIAGNOSIS — S20212A Contusion of left front wall of thorax, initial encounter: Secondary | ICD-10-CM

## 2015-08-04 DIAGNOSIS — W19XXXA Unspecified fall, initial encounter: Secondary | ICD-10-CM

## 2015-08-04 MED ORDER — HYDROCODONE-ACETAMINOPHEN 5-325 MG PO TABS
1.0000 | ORAL_TABLET | Freq: Once | ORAL | Status: AC
  Administered 2015-08-04: 1 via ORAL
  Filled 2015-08-04: qty 1

## 2015-08-04 MED ORDER — HYDROCODONE-ACETAMINOPHEN 5-325 MG PO TABS: 1.0000 | ORAL_TABLET | Freq: Four times a day (QID) | ORAL | Status: DC | PRN

## 2015-08-04 NOTE — ED Provider Notes (Signed)
CSN: [id number]     Arrival date & time 08/03/15  2338 History  By signing my name below, I, Terrance Branch, attest that this documentation has been prepared under the direction and in the presence of Veryl Speak, MD. Electronically Signed: Randa Evens, ED Scribe. 08/04/2015. 2:26 AM.     Chief Complaint  Patient presents with  . Fall   Patient is a 81 y.o. female presenting with fall. The history is provided by the patient. No language interpreter was used.  Fall This is a new problem. The current episode started 1 to 2 hours ago. The problem occurs rarely. The problem has not changed since onset.Pertinent negatives include no abdominal pain and no shortness of breath. Nothing aggravates the symptoms. Nothing relieves the symptoms. She has tried acetaminophen for the symptoms.   HPI Comments: Natalie West is a 81 y.o. female brought in by ambulance, who presents to the Emergency Department complaining of fall onset tonight PTA. Pt states that she is unsure how she fell, she states she was turning to throw out some trash and somehow lost her balance. Pt states she fell onto her upper back landing on her fire place. She is complaining of upper back pain that's worse with deep breathing. Pt states she has tried tylenol PTA.  Pt denies LOC. Pt denies anticoagulant use.   Past Medical History  Diagnosis Date  . Hypercholesterolemia   . Esophageal reflux   . Depression   . Anxiety   . Chronic UTI   . Breast cancer ([place]) 2006    right breast  . Esophageal stricture   . Personal history of colonic polyps 02/09/2007    TUBULAR ADENOMA  . Family history of malignant neoplasm of gastrointestinal tract   . PONV (postoperative nausea and vomiting)   . Chronic cough   . Rotator cuff tear     RT  . Osteopenia   . Arthritis   . Back pain, chronic     WITH MOTION  . Hearing loss   . Status post dilation of esophageal narrowing   . GERD (gastroesophageal reflux disease)   . Urinary tract  infection   . Umbilical hernia    Past Surgical History  Procedure Laterality Date  . Breast lumpectomy      right  . Umbilical hernia repair    . Dilation and curettage of uterus    . Colonoscopy    . Shoulder open rotator cuff repair Right 02/28/2013    Procedure: RIGHT ROTATOR CUFF REPAIR SHOULDER OPEN WITH GRAFT AND ANCHORS;  Surgeon: Tobi Bastos, MD;  Location: [facility];  Service: Orthopedics;  Laterality: Right;  . Cataract extraction w/ intraocular lens implant Left    Family History  Problem Relation Age of Onset  . Colon cancer Mother 70  . Kidney cancer Mother   . Heart disease Sister   . Stroke Sister   . Stomach cancer Brother   . Lung cancer Brother   . Clotting disorder Son   . Emphysema Father     smoker  . Hypertension Sister   . Glaucoma Mother    Social History  Substance Use Topics  . Smoking status: Never Smoker   . Smokeless tobacco: Never Used  . Alcohol Use: No   OB History    Gravida Para Term Preterm AB TAB SAB Ectopic Multiple Living   1 1        1      Review of Systems  Respiratory: Negative for  shortness of breath.   Gastrointestinal: Negative for abdominal pain.  Musculoskeletal: Positive for back pain.  All other systems reviewed and are negative.     Allergies  Ciprofloxacin; Clindamycin/lincomycin; Codeine; Diclofenac; Fenoprofen calcium; Flexeril; Macrobid; Noroxin; Relafen; and Sulfa antibiotics  Home Medications   Prior to Admission medications   Medication Sig Start Date End Date Taking? Authorizing Provider  acetaminophen (TYLENOL) 500 MG tablet Take 500 mg by mouth every 6 (six) hours as needed for mild pain.   Yes Historical Provider, MD  docusate sodium (COLACE) 100 MG capsule Take 1 capsule (100 mg total) by mouth daily. Patient not taking: Reported on 08/04/2015 08/22/14   Inda Castle, MD  gabapentin (NEURONTIN) 100 MG capsule Take 100 mg by mouth 2 (two) times daily.  10/24/14  Yes Historical Provider, MD   hydrochlorothiazide (MICROZIDE) 12.5 MG capsule Take 12.5 mg by mouth daily. 05/04/15  Yes Historical Provider, MD  lisinopril (PRINIVIL,ZESTRIL) 10 MG tablet Take 10 mg by mouth daily. 07/13/15  Yes Historical Provider, MD  omeprazole (PRILOSEC) 20 MG capsule Take 20 mg by mouth as needed.    Yes Historical Provider, MD  perphenazine-amitriptyline (ETRAFON/TRIAVIL) 2-10 MG TABS Take 1 tablet by mouth 2 (two) times daily.   Yes Historical Provider, MD  pravastatin (PRAVACHOL) 20 MG tablet Take 20 mg by mouth every evening.    Yes Historical Provider, MD   BP 105/70 mmHg  Pulse 74  Temp(Src) 97.6 F (36.4 C) (Oral)  Resp 24  Ht 5\' 4"  (1.626 m)  Wt 148 lb 2 oz (67.189 kg)  BMI 25.41 kg/m2  SpO2 93%  LMP 11/03/1983   Physical Exam  Constitutional: She is oriented to person, place, and time. She appears well-developed and well-nourished. No distress.  HENT:  Head: Normocephalic and atraumatic.  Eyes: Conjunctivae and EOM are normal.  Neck: Neck supple. No tracheal deviation present.  Cardiovascular: Normal rate, regular rhythm and normal heart sounds.   No murmur heard. Pulmonary/Chest: Effort normal. No respiratory distress. She has no wheezes. She has no rales. She exhibits tenderness.  There is an abrasion to the left posterior chest wall just to the left of midline below the shoulder blade. No crepitus.   Abdominal: Soft. She exhibits no distension. There is no tenderness. There is no rebound and no guarding.  Musculoskeletal: Normal range of motion. She exhibits tenderness.  TTP in the soft tissues of the lower thoracic region. No bony tenderness or step off noted.   Neurological: She is alert and oriented to person, place, and time.  Skin: Skin is warm and dry.  Psychiatric: She has a normal mood and affect. Her behavior is normal.  Nursing note and vitals reviewed.   ED Course  Procedures (including critical care time) DIAGNOSTIC STUDIES: Oxygen Saturation is 94% on RA,  normal by my interpretation.    COORDINATION OF CARE: 12:29 AM-Discussed treatment plan with pt at bedside and pt agreed to plan.     Labs Review Labs Reviewed - No data to display  Imaging Review Dg Ribs Unilateral W/chest Left  08/04/2015   CLINICAL DATA:  Status post fall, with injury to the left posterior ribs and back. Initial encounter.  EXAM: LEFT RIBS AND CHEST - 3+ VIEW  COMPARISON:  Chest radiograph performed 12/10/2012  FINDINGS: No displaced rib fractures are seen.  The lungs are well-aerated. Mild bilateral atelectasis is noted. There is no evidence of focal opacification, pleural effusion or pneumothorax.  The cardiomediastinal silhouette is mildly enlarged. No  acute osseous abnormalities are seen. Scattered clips are seen overlying the right axilla.  IMPRESSION: 1. No displaced rib fracture seen. 2. Mild bilateral atelectasis noted.  Mild cardiomegaly.   Electronically Signed   By: Garald Balding M.D.   On: 08/04/2015 01:57   Dg Thoracic Spine 2 View  08/04/2015   CLINICAL DATA:  Status post fall, with left posterior rib and back pain. Initial encounter.  EXAM: THORACIC SPINE 2 VIEWS  COMPARISON:  Chest radiograph performed 12/10/2012  FINDINGS: There is no evidence of fracture or subluxation. Vertebral bodies demonstrate normal height and alignment. Intervertebral disc spaces are preserved. A few anterior osteophytes are seen along the lower thoracic spine.  The visualized portions of both lungs are clear. The mediastinum is unremarkable in appearance.  IMPRESSION: No evidence of fracture or subluxation along the thoracic spine.   Electronically Signed   By: Garald Balding M.D.   On: 08/04/2015 01:58      EKG Interpretation None      MDM   Final diagnoses:  None      X-rays are negative for fracture. She will be treated with pain medication and when necessary return.   I personally performed the services described in this documentation, which was scribed in my  presence. The recorded information has been reviewed and is accurate.      Veryl Speak, MD 08/04/15 [phone]

## 2015-08-04 NOTE — Discharge Instructions (Signed)
Hydrocodone as prescribed as needed for pain.  Return to the emergency department if symptoms significantly worsen or change.   Chest Contusion A chest contusion is a deep bruise on your chest area. Contusions are the result of an injury that caused bleeding under the skin. A chest contusion may involve bruising of the skin, muscles, or ribs. The contusion may turn blue, purple, or yellow. Minor injuries will give you a painless contusion, but more severe contusions may stay painful and swollen for a few weeks. CAUSES  A contusion is usually caused by a blow, trauma, or direct force to an area of the body. SYMPTOMS   Swelling and redness of the injured area.  Discoloration of the injured area.  Tenderness and soreness of the injured area.  Pain. DIAGNOSIS  The diagnosis can be made by taking a history and performing a physical exam. An X-ray, CT scan, or MRI may be needed to determine if there were any associated injuries, such as broken bones (fractures) or internal injuries. TREATMENT  Often, the best treatment for a chest contusion is resting, icing, and applying cold compresses to the injured area. Deep breathing exercises may be recommended to reduce the risk of pneumonia. Over-the-counter medicines may also be recommended for pain control. HOME CARE INSTRUCTIONS   Put ice on the injured area.  Put ice in a plastic bag.  Place a towel between your skin and the bag.  Leave the ice on for 15-20 minutes, 03-04 times a day.  Only take over-the-counter or prescription medicines as directed by your caregiver. Your caregiver may recommend avoiding anti-inflammatory medicines (aspirin, ibuprofen, and naproxen) for 48 hours because these medicines may increase bruising.  Rest the injured area.  Perform deep-breathing exercises as directed by your caregiver.  Stop smoking if you smoke.  Do not lift objects over 5 pounds (2.3 kg) for 3 days or longer if recommended by your  caregiver. SEEK IMMEDIATE MEDICAL CARE IF:   You have increased bruising or swelling.  You have pain that is getting worse.  You have difficulty breathing.  You have dizziness, weakness, or fainting.  You have blood in your urine or stool.  You cough up or vomit blood.  Your swelling or pain is not relieved with medicines. MAKE SURE YOU:   Understand these instructions.  Will watch your condition.  Will get help right away if you are not doing well or get worse. Document Released: 07/14/2001 Document Revised: 07/13/2012 Document Reviewed: 04/11/2012 [facility] Patient Information 2015 [place], [place]. This information is not intended to replace advice given to you by your health care provider. Make sure you discuss any questions you have with your health care provider.

## 2015-08-04 NOTE — ED Notes (Signed)
Pt verbalized understanding of d/c instructions and has no further questions. Pt stable and NAD.  

## 2015-10-16 ENCOUNTER — Other Ambulatory Visit: Payer: Self-pay

## 2015-10-16 DIAGNOSIS — Z1231 Encounter for screening mammogram for malignant neoplasm of breast: Secondary | ICD-10-CM

## 2015-11-08 ENCOUNTER — Ambulatory Visit: Payer: Self-pay

## 2015-11-18 ENCOUNTER — Telehealth: Payer: Self-pay | Admitting: Hematology and Oncology

## 2015-11-18 ENCOUNTER — Encounter: Payer: Self-pay | Admitting: Hematology and Oncology

## 2015-11-18 ENCOUNTER — Ambulatory Visit (HOSPITAL_BASED_OUTPATIENT_CLINIC_OR_DEPARTMENT_OTHER): Payer: PPO | Admitting: Hematology and Oncology

## 2015-11-18 VITALS — BP 96/68 | HR 96 | Temp 97.8°F | Resp 18 | Wt 142.2 lb

## 2015-11-18 DIAGNOSIS — C50511 Malignant neoplasm of lower-outer quadrant of right female breast: Secondary | ICD-10-CM | POA: Diagnosis not present

## 2015-11-18 NOTE — Assessment & Plan Note (Signed)
Right breast cancer T1 A. N0 M0 stage IA diagnosed in 2006 treated with lumpectomy and radiation followed by antiestrogen therapy with Aromasin 2006-2011. She is currently being watched with annual mammograms. Her last mammogram in January 2016 is normal.   Breast cancer surveillance:  1. Mammogram done 11/05/2014 is normal, new appointment for a mammogram to be set up for this month. 2. breast exam done 11/18/2015 is normal Return to clinic in 1 year for follow-up with survivorship clinic

## 2015-11-18 NOTE — Progress Notes (Signed)
Patient Care Team: Christain Sacramento, MD as PCP - General (Family Medicine)  DIAGNOSIS: Breast cancer of lower-outer quadrant of right female breast [facility])   Staging form: Breast, AJCC 7th Edition     Clinical: T1a, N0, cM0 - Unsigned     Pathologic: Stage IA (T1a, N0, cM0) - Signed by Rulon Eisenmenger, MD on 06/28/2014   SUMMARY OF ONCOLOGIC HISTORY:   Breast cancer of lower-outer quadrant of right female breast ([place])   11/10/2004 Surgery Right breast lumpectomy invasive ductal carcinoma grade 1; 0.5 cm 2 sentinel lymph nodes negative ER 85% PR 0% Ki-67 19% HER-2 2+ by IHC and negative by fish, no lymphovascular invasion T1, N0, M0 stage IA   12/29/2004 - 01/26/2005 Radiation Therapy Radiation therapy to lumpectomy site   02/26/2005 - 02/26/2010 Anti-estrogen oral therapy Aromasin 25 mg by mouth daily: Side effects including depression.    CHIEF COMPLIANT: Surveillance of breast cancer  INTERVAL HISTORY: Natalie West is a 82 year old with above-mentioned history of right breast cancer treated with lumpectomy and radiation. 5 years of antiestrogen therapy completed in 2011. She is here for annual follow-up. She reports no major problems or concerns regarding her breasts. Her husband passed away a week ago. 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 Her son lives in a trailer behind her house. She is feels weak in her legs and her blood pressure is quite low today.  REVIEW OF SYSTEMS:   Constitutional: Denies fevers, chills or abnormal weight loss Eyes: Denies blurriness of vision Ears, nose, mouth, throat, and face: Denies mucositis or sore throat Respiratory: Denies cough, dyspnea or wheezes Cardiovascular: Denies palpitation, chest discomfort Gastrointestinal:  Denies nausea, heartburn or change in bowel habits Skin: Denies abnormal skin rashes Lymphatics: Denies new lymphadenopathy or easy bruising Neurological:Denies numbness, tingling  or new weaknesses Behavioral/Psych: Mood is stable, no new changes  Extremities: No lower extremity edema Breast: Slight nodularity on the sides of the surgical excisions previously. All other systems were reviewed with the patient and are negative.  I have reviewed the past medical history, past surgical history, social history and family history with the patient and they are unchanged from previous note.  ALLERGIES:  is allergic to ciprofloxacin; clindamycin/lincomycin; codeine; diclofenac; fenoprofen calcium; flexeril; macrobid; noroxin; relafen; and sulfa antibiotics.  MEDICATIONS:  Current Outpatient Prescriptions  Medication Sig Dispense Refill  . acetaminophen (TYLENOL) 500 MG tablet Take 500 mg by mouth every 6 (six) hours as needed for mild pain.    Marland Kitchen docusate sodium (COLACE) 100 MG capsule Take 1 capsule (100 mg total) by mouth daily. (Patient not taking: Reported on 08/04/2015) 30 capsule 0  . gabapentin (NEURONTIN) 100 MG capsule Take 100 mg by mouth 2 (two) times daily.   2  . hydrochlorothiazide (MICROZIDE) 12.5 MG capsule Take 12.5 mg by mouth daily.  3  . lisinopril (PRINIVIL,ZESTRIL) 10 MG tablet Take 10 mg by mouth daily.  2  . omeprazole (PRILOSEC) 20 MG capsule Take 20 mg by mouth as needed.     Marland Kitchen perphenazine-amitriptyline (ETRAFON/TRIAVIL) 2-10 MG TABS Take 1 tablet by mouth 2 (two) times daily.    . pravastatin (PRAVACHOL) 20 MG tablet Take 20 mg by mouth every evening.      No current facility-administered medications for this visit.    PHYSICAL EXAMINATION: ECOG PERFORMANCE STATUS: 2 - Symptomatic, <50% confined to bed  Filed Vitals:   11/18/15 1345  BP: 96/68  Pulse: 96  Temp: 97.8 F (36.6 C)  Resp: 18   Filed Weights   11/18/15 1345  Weight: 142 lb 3.2 oz (64.501 kg)    GENERAL:alert, no distress and comfortable SKIN: skin color, texture, turgor are normal, no rashes or significant lesions EYES: normal, Conjunctiva are pink and non-injected,  sclera clear OROPHARYNX:no exudate, no erythema and lips, buccal mucosa, and tongue normal  NECK: supple, thyroid normal size, non-tender, without nodularity LYMPH:  no palpable lymphadenopathy in the cervical, axillary or inguinal LUNGS: clear to auscultation and percussion with normal breathing effort HEART: regular rate & rhythm and no murmurs and no lower extremity edema ABDOMEN:abdomen soft, non-tender and normal bowel sounds MUSCULOSKELETAL:no cyanosis of digits and no clubbing  NEURO: alert & oriented x 3 with fluent speech, no focal motor/sensory deficits EXTREMITIES: No lower extremity edema BREAST: No palpable masses or nodules in either right or left breasts. No palpable axillary supraclavicular or infraclavicular adenopathy no breast tenderness or nipple discharge. (exam performed in the presence of a chaperone)  LABORATORY DATA:  I have reviewed the data as listed   Chemistry      Component Value Date/Time   NA 142 01/16/2015 1450   NA 142 06/28/2014 1400   NA 142 03/04/2010 0919   K 3.8 01/16/2015 1450   K 3.9 06/28/2014 1400   K 4.1 03/04/2010 0919   CL 106 01/16/2015 1450   CL 109* 03/31/2013 0917   CL 109* 03/04/2010 0919   CO2 22 01/16/2015 1450   CO2 25 06/28/2014 1400   CO2 29 03/04/2010 0919   BUN 18 01/16/2015 1450   BUN 16.0 06/28/2014 1400   BUN 18 03/04/2010 0919   CREATININE 0.82 01/16/2015 1450   CREATININE 0.8 06/28/2014 1400   CREATININE 0.87 02/28/2013 0802      Component Value Date/Time   CALCIUM 8.7 01/16/2015 1450   CALCIUM 9.1 06/28/2014 1400   CALCIUM 9.4 03/04/2010 0919   ALKPHOS 85 06/28/2014 1400   ALKPHOS 93 02/28/2013 0802   ALKPHOS 90* 03/04/2010 0919   AST 25 06/28/2014 1400   AST 21 02/28/2013 0802   AST 33 03/04/2010 0919   ALT 16 06/28/2014 1400   ALT 11 02/28/2013 0802   ALT 26 03/04/2010 0919   BILITOT 0.47 06/28/2014 1400   BILITOT 0.4 02/28/2013 0802   BILITOT 0.50 03/04/2010 0919       Lab Results  Component  Value Date   WBC 5.9 01/16/2015   HGB 13.4 01/16/2015   HCT 40.0 01/16/2015   MCV 93.5 01/16/2015   PLT 210 01/16/2015   NEUTROABS 3.8 01/16/2015     ASSESSMENT & PLAN:  Breast cancer of lower-outer quadrant of right female breast ([place]) Right breast cancer T1 A. N0 M0 stage IA diagnosed in 2006 treated with lumpectomy and radiation followed by antiestrogen therapy with Aromasin 2006-2011. She is currently being watched with annual mammograms. Her last mammogram in January 2016 is normal.   Breast cancer surveillance:  1. Mammogram done 11/05/2014 is normal, new appointment for a mammogram to be set up for this month. 2. breast exam done 11/18/2015 is normal  Her husband passed away a week ago. 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 She would also need a bladder surgery for urinary incontinence which she had been postponing for a while and would like to get it done in this year.  Return to clinic in 1 year for follow-up with  survivorship clinic  No orders of the defined types were placed in this encounter.   The patient has a good understanding of the overall plan. she agrees with it. she will call with any problems that may develop before the next visit here.   Rulon Eisenmenger, MD 11/18/2015

## 2015-11-18 NOTE — Telephone Encounter (Signed)
Appointments made and avs printed for patient °

## 2015-11-20 DIAGNOSIS — L84 Corns and callosities: Secondary | ICD-10-CM | POA: Diagnosis not present

## 2015-11-20 DIAGNOSIS — L219 Seborrheic dermatitis, unspecified: Secondary | ICD-10-CM | POA: Diagnosis not present

## 2015-11-26 DIAGNOSIS — N312 Flaccid neuropathic bladder, not elsewhere classified: Secondary | ICD-10-CM | POA: Diagnosis not present

## 2015-11-26 DIAGNOSIS — R3915 Urgency of urination: Secondary | ICD-10-CM | POA: Diagnosis not present

## 2015-11-26 DIAGNOSIS — N390 Urinary tract infection, site not specified: Secondary | ICD-10-CM | POA: Diagnosis not present

## 2015-11-26 DIAGNOSIS — Z0000 Encounter for general adult medical examination without abnormal findings: Secondary | ICD-10-CM | POA: Diagnosis not present

## 2015-12-02 ENCOUNTER — Ambulatory Visit
Admission: RE | Admit: 2015-12-02 | Discharge: 2015-12-02 | Disposition: A | Discharge status: Home / Self Care | Payer: PPO | Source: Ambulatory Visit

## 2015-12-02 DIAGNOSIS — Z1231 Encounter for screening mammogram for malignant neoplasm of breast: Secondary | ICD-10-CM | POA: Diagnosis not present

## 2015-12-09 DIAGNOSIS — H524 Presbyopia: Secondary | ICD-10-CM | POA: Diagnosis not present

## 2015-12-09 DIAGNOSIS — H3562 Retinal hemorrhage, left eye: Secondary | ICD-10-CM | POA: Diagnosis not present

## 2015-12-09 DIAGNOSIS — H3561 Retinal hemorrhage, right eye: Secondary | ICD-10-CM | POA: Diagnosis not present

## 2015-12-09 DIAGNOSIS — H02403 Unspecified ptosis of bilateral eyelids: Secondary | ICD-10-CM | POA: Diagnosis not present

## 2015-12-09 DIAGNOSIS — H40013 Open angle with borderline findings, low risk, bilateral: Secondary | ICD-10-CM | POA: Diagnosis not present

## 2016-01-17 DIAGNOSIS — I951 Orthostatic hypotension: Secondary | ICD-10-CM | POA: Diagnosis not present

## 2016-01-17 DIAGNOSIS — L84 Corns and callosities: Secondary | ICD-10-CM | POA: Diagnosis not present

## 2016-01-17 DIAGNOSIS — I10 Essential (primary) hypertension: Secondary | ICD-10-CM | POA: Diagnosis not present

## 2016-01-28 DIAGNOSIS — R3915 Urgency of urination: Secondary | ICD-10-CM | POA: Diagnosis not present

## 2016-01-28 DIAGNOSIS — N3941 Urge incontinence: Secondary | ICD-10-CM | POA: Diagnosis not present

## 2016-01-28 DIAGNOSIS — R338 Other retention of urine: Secondary | ICD-10-CM | POA: Diagnosis not present

## 2016-02-18 DIAGNOSIS — Z0000 Encounter for general adult medical examination without abnormal findings: Secondary | ICD-10-CM | POA: Diagnosis not present

## 2016-02-18 DIAGNOSIS — N390 Urinary tract infection, site not specified: Secondary | ICD-10-CM | POA: Diagnosis not present

## 2016-02-18 DIAGNOSIS — N393 Stress incontinence (female) (male): Secondary | ICD-10-CM | POA: Diagnosis not present

## 2016-02-18 DIAGNOSIS — N8111 Cystocele, midline: Secondary | ICD-10-CM | POA: Diagnosis not present

## 2016-02-18 DIAGNOSIS — N3941 Urge incontinence: Secondary | ICD-10-CM | POA: Diagnosis not present

## 2016-02-18 DIAGNOSIS — R3915 Urgency of urination: Secondary | ICD-10-CM | POA: Diagnosis not present

## 2016-02-27 ENCOUNTER — Other Ambulatory Visit: Payer: Self-pay | Admitting: Urology

## 2016-02-27 DIAGNOSIS — N393 Stress incontinence (female) (male): Secondary | ICD-10-CM | POA: Diagnosis not present

## 2016-02-27 DIAGNOSIS — I10 Essential (primary) hypertension: Secondary | ICD-10-CM | POA: Diagnosis not present

## 2016-02-27 DIAGNOSIS — R600 Localized edema: Secondary | ICD-10-CM | POA: Diagnosis not present

## 2016-02-27 DIAGNOSIS — R350 Frequency of micturition: Secondary | ICD-10-CM | POA: Diagnosis not present

## 2016-02-27 DIAGNOSIS — L84 Corns and callosities: Secondary | ICD-10-CM | POA: Diagnosis not present

## 2016-02-27 DIAGNOSIS — G629 Polyneuropathy, unspecified: Secondary | ICD-10-CM | POA: Diagnosis not present

## 2016-02-27 DIAGNOSIS — I951 Orthostatic hypotension: Secondary | ICD-10-CM | POA: Diagnosis not present

## 2016-03-03 DIAGNOSIS — R0602 Shortness of breath: Secondary | ICD-10-CM | POA: Diagnosis not present

## 2016-03-06 DIAGNOSIS — I480 Paroxysmal atrial fibrillation: Secondary | ICD-10-CM | POA: Diagnosis not present

## 2016-03-06 DIAGNOSIS — F418 Other specified anxiety disorders: Secondary | ICD-10-CM | POA: Diagnosis not present

## 2016-03-06 DIAGNOSIS — R002 Palpitations: Secondary | ICD-10-CM | POA: Diagnosis not present

## 2016-03-10 DIAGNOSIS — R278 Other lack of coordination: Secondary | ICD-10-CM | POA: Diagnosis not present

## 2016-03-10 DIAGNOSIS — M6281 Muscle weakness (generalized): Secondary | ICD-10-CM | POA: Diagnosis not present

## 2016-03-10 DIAGNOSIS — N3941 Urge incontinence: Secondary | ICD-10-CM | POA: Diagnosis not present

## 2016-03-10 DIAGNOSIS — N8111 Cystocele, midline: Secondary | ICD-10-CM | POA: Diagnosis not present

## 2016-03-12 DIAGNOSIS — I4891 Unspecified atrial fibrillation: Secondary | ICD-10-CM | POA: Diagnosis not present

## 2016-03-18 DIAGNOSIS — M6281 Muscle weakness (generalized): Secondary | ICD-10-CM | POA: Diagnosis not present

## 2016-03-18 DIAGNOSIS — N3941 Urge incontinence: Secondary | ICD-10-CM | POA: Diagnosis not present

## 2016-03-18 DIAGNOSIS — N8111 Cystocele, midline: Secondary | ICD-10-CM | POA: Diagnosis not present

## 2016-03-18 DIAGNOSIS — R278 Other lack of coordination: Secondary | ICD-10-CM | POA: Diagnosis not present

## 2016-03-24 DIAGNOSIS — R278 Other lack of coordination: Secondary | ICD-10-CM | POA: Diagnosis not present

## 2016-03-24 DIAGNOSIS — N3941 Urge incontinence: Secondary | ICD-10-CM | POA: Diagnosis not present

## 2016-03-24 DIAGNOSIS — N393 Stress incontinence (female) (male): Secondary | ICD-10-CM | POA: Diagnosis not present

## 2016-03-24 DIAGNOSIS — N8111 Cystocele, midline: Secondary | ICD-10-CM | POA: Diagnosis not present

## 2016-03-24 DIAGNOSIS — M6281 Muscle weakness (generalized): Secondary | ICD-10-CM | POA: Diagnosis not present

## 2016-03-25 ENCOUNTER — Other Ambulatory Visit: Payer: Self-pay | Admitting: Urology

## 2016-04-07 DIAGNOSIS — N3941 Urge incontinence: Secondary | ICD-10-CM | POA: Diagnosis not present

## 2016-04-07 DIAGNOSIS — N8111 Cystocele, midline: Secondary | ICD-10-CM | POA: Diagnosis not present

## 2016-04-07 DIAGNOSIS — N393 Stress incontinence (female) (male): Secondary | ICD-10-CM | POA: Diagnosis not present

## 2016-04-07 DIAGNOSIS — R278 Other lack of coordination: Secondary | ICD-10-CM | POA: Diagnosis not present

## 2016-04-07 DIAGNOSIS — M6281 Muscle weakness (generalized): Secondary | ICD-10-CM | POA: Diagnosis not present

## 2016-04-13 ENCOUNTER — Encounter (HOSPITAL_COMMUNITY): Payer: Self-pay

## 2016-04-13 ENCOUNTER — Encounter (HOSPITAL_COMMUNITY)
Admission: RE | Admit: 2016-04-13 | Discharge: 2016-04-13 | Disposition: A | Discharge status: Home / Self Care | Payer: PPO | Source: Ambulatory Visit | Attending: Urology | Admitting: Urology

## 2016-04-13 DIAGNOSIS — N393 Stress incontinence (female) (male): Secondary | ICD-10-CM | POA: Insufficient documentation

## 2016-04-13 DIAGNOSIS — N8111 Cystocele, midline: Secondary | ICD-10-CM | POA: Insufficient documentation

## 2016-04-13 DIAGNOSIS — Z01812 Encounter for preprocedural laboratory examination: Secondary | ICD-10-CM | POA: Diagnosis not present

## 2016-04-13 HISTORY — DX: Cardiac arrhythmia, unspecified: I49.9

## 2016-04-13 HISTORY — DX: Personal history of other diseases of the digestive system: Z87.19

## 2016-04-13 HISTORY — DX: Essential (primary) hypertension: I10

## 2016-04-13 LAB — BASIC METABOLIC PANEL
Anion gap: 7 (ref 5–15)
BUN: 16 mg/dL (ref 6–20)
CO2: 29 mmol/L (ref 22–32)
Calcium: 9.1 mg/dL (ref 8.9–10.3)
Chloride: 103 mmol/L (ref 101–111)
Creatinine, Ser: 0.93 mg/dL (ref 0.44–1.00)
GFR calc Af Amer: >60 mL/min (ref >60)
GFR calc non Af Amer: 56 mL/min — ABNORMAL LOW (ref >60)
Glucose, Bld: 96 mg/dL (ref 65–99)
Potassium: 3.7 mmol/L (ref 3.5–5.1)
Sodium: 139 mmol/L (ref 135–145)

## 2016-04-13 LAB — CBC
HCT: 37.8 % (ref 36.0–46.0)
Hemoglobin: 12.5 g/dL (ref 12.0–15.0)
MCH: 31.3 pg (ref 26.0–34.0)
MCHC: 33.1 g/dL (ref 30.0–36.0)
MCV: 94.7 fL (ref 78.0–100.0)
Platelets: 163 10*3/uL (ref 150–400)
RBC: 3.99 MIL/uL (ref 3.87–5.11)
RDW: 12.3 % (ref 11.5–15.5)
WBC: 6.8 10*3/uL (ref 4.0–10.5)

## 2016-04-13 NOTE — Patient Instructions (Addendum)
Natalie West  04/13/2016   Your procedure is scheduled on: April 20, 2016  Report to [facility] Main  Entrance take [facility]  elevators to 3rd floor to  [place] at 7:00 AM.  Call this number if you have problems the morning of surgery [phone]   Remember: ONLY 1 PERSON MAY GO WITH YOU TO SHORT STAY TO GET  READY MORNING OF [place].  Do not eat food after midnight Saturday. Sunday morning may follow clear liquid diet until midnight Sunday.  Then nothing by mouth.              Follow bowel prep instruction.   CLEAR LIQUID DIET   Foods Allowed                                                                     Foods Excluded  Coffee and tea, regular and decaf                             liquids that you cannot  Plain Jell-O in any flavor                                             see through such as: Fruit ices (not with fruit pulp)                                     milk, soups, orange juice  Iced Popsicles                                    All solid food Carbonated beverages, regular and diet                                    Cranberry, grape and apple juices Sports drinks like Gatorade Lightly seasoned clear broth or consume(fat free) Sugar, honey syrup  Sample Menu Breakfast                                Lunch                                     Supper Cranberry juice                    Beef broth                            Chicken broth Jell-O  Grape juice                           Apple juice Coffee or tea                        Jell-O                                      Popsicle                                                Coffee or tea                        Coffee or tea  _____________________________________________________________________       Take these medicines the morning of surgery with A SIP OF WATER:  Prilosec, Meclizine (Antivert),  DO NOT TAKE ANY DIABETIC MEDICATIONS DAY OF  YOUR SURGERY                               You may not have any metal on your body including hair pins and              piercings  Do not wear jewelry, make-up, lotions, powders or perfumes, deodorant             Do not wear nail polish.  Do not shave  48 hours prior to surgery.             Do not bring valuables to the hospital. [place].  Contacts, dentures or bridgework may not be worn into surgery.  Leave suitcase in the car. After surgery it may be brought to your room.       Special Instructions:  Coughing and deep breathing exercises, leg exercises              Please read over the following fact sheets you were given: _____________________________________________________________________             [facility] - Preparing for Surgery Before surgery, you can play an important role.  Because skin is not sterile, your skin needs to be as free of germs as possible.  You can reduce the number of germs on your skin by washing with CHG (chlorahexidine gluconate) soap before surgery.  CHG is an antiseptic cleaner which kills germs and bonds with the skin to continue killing germs even after washing. Please DO NOT use if you have an allergy to CHG or antibacterial soaps.  If your skin becomes reddened/irritated stop using the CHG and inform your nurse when you arrive at Short Stay. Do not shave (including legs and underarms) for at least 48 hours prior to the first CHG shower.  You may shave your face/neck. Please follow these instructions carefully:  1.  Shower with CHG Soap the night before surgery and the  morning of Surgery.  2.  If you choose to wash your hair, wash your hair first as usual with your  normal  shampoo.  3.  After you shampoo,  rinse your hair and body thoroughly to remove the  shampoo.                           4.  Use CHG as you would any other liquid soap.  You can apply chg directly  to the skin and wash                        Gently with a scrungie or clean washcloth.  5.  Apply the CHG Soap to your body ONLY FROM THE NECK DOWN.   Do not use on face/ open                           Wound or open sores. Avoid contact with eyes, ears mouth and genitals (private parts).                       Wash face,  Genitals (private parts) with your normal soap.             6.  Wash thoroughly, paying special attention to the area where your surgery  will be performed.  7.  Thoroughly rinse your body with warm water from the neck down.  8.  DO NOT shower/wash with your normal soap after using and rinsing off  the CHG Soap.                9.  Pat yourself dry with a clean towel.            10.  Wear clean pajamas.            11.  Place clean sheets on your bed the night of your first shower and do not  sleep with pets. Day of Surgery : Do not apply any lotions/deodorants the morning of surgery.  Please wear clean clothes to the hospital/surgery center.  FAILURE TO FOLLOW THESE INSTRUCTIONS MAY RESULT IN THE CANCELLATION OF YOUR SURGERY PATIENT SIGNATURE_________________________________  NURSE SIGNATURE__________________________________  ________________________________________________________________________

## 2016-04-13 NOTE — Progress Notes (Signed)
02-27-16 - Surgical clearance - Dr. Redmond Pulling (fam.med.) - in chart 03-06-16 - LOV - Dr. Redmond Pulling (fam.med.) - in chart 03-06-16 - EKG & CXR - in chart  11-15-14 - 2D Echo - [facility]

## 2016-04-19 NOTE — H&P (Signed)
Reason For Visit F/u to review urodynamics  Active Problems Problems  1. Bladder pain (R39.89) 2. Cystocele, midline (N81.11)  Assessed By: Carolan Clines (Urology); Last Assessed: 18 Feb 2016 3. Dysuria (R30.0) 4. Female stress incontinence (N39.3) 5. Muscle weakness (M62.81) 6. Muscular incoordination (R27.8) 7. Other retention of urine (R33.8) 8. Suspected urinary tract infection (N39.0) 9. Urge incontinence of urine (N39.41)  Assessed By: Carolan Clines (Urology); Last Assessed: 18 Feb 2016 10. Urinary tract infection (N39.0) 11. Urinary urgency (R39.15)  Assessed By: Carolan Clines (Urology); Last Assessed: 18 Feb 2016 History of Present Illness 82 year old female returns today to review urodynamic results. Hx of complaining of bladder pain, urgency, dysuria. She now notes stress urinary incontinence.  She was treated by physical therapy, but has continued to have bladder pain and urgency. She was treated initially with Rapaflo, with some response. She has been treating the past with double voiding in combination with Rapaflo. The patient has a history of cystocele, originally referred by Dr. Hale Bogus, noticing a "bulge" through her vagina, since February 2016. It has been associated with discomfort in the vaginal area. She denies frequency, urgency. She denies stress urinary incontinence. She did have a pessary placed, but the pessary caused bleeding and it was removed. Renal ultrasound showed no hydronephrosis. Note her past history of breast cancer, anxiety, depression. Her physical examination has shown a central cystocele, grade 3 over 4. There is no enterocele no rectocele present. Cervix is present without abnormality. The uterus was normal. There was significant vulvar atrophy. Urethra was nontender. There did appear to be urethral prolapse, however. There was vaginal atrophy noted as well. There was poor estrogenization of the vagina. She was known to have  a hypotonic bladder, with chronic infection. She is also known to have a history of breast cancer, and chronic P. She has been offered cystocele surgery in the past but has never wanted to have surgery yet. She understands she will need video urodynamics and ureteroscopy prior surgical intervention. Video urodynamics on 01/28/16: The first sensation of Natalie at 329 cc with normal desire at 336 cc. Strong desire occurred at 460 cc. The patient has a maximum bladder contraction of 15 centers of water at greater 32 cc, with severe urinary leakage. Valsalva leak point pressures accomplished, and shows that the patient did not leak for stress urinary in constant determination. She did however generated abdominal pressure of 94 centers of water pressure. She did not leak with or without reduction of the prolapse. Pressure flow study is accomplished, and shows a voided volume of 123 cc, with maximum flow rate of 9 cc/s. The detrusor pressure at maximum flow was 16 cm of water pressure, with maximum detrusor pressure of 18 cm water pressure. Postvoid residual is 213 cc. VCUG shows the bladder neck descent of more than 3 cm. There is no bony abnormality. There is no bladder stone. The contour of the bladder is trabeculated. There was no reflux noted, however. The patient has a maximum capacity of 460 cc. There is no stress incontinence noted with or without reduction of the prolapse. The patient does have bladder instability with involuntary voiding. She is able to void voluntarily, with the vaginal packing in place, and noted that her flow was improved. She did not empty well, with a PVR of 214 cc. There was no reflux noted.  Past Medical History Problems  1. History of Anxiety (F41.9) 2. History of Breast Cancer 3. History of depression (Z86.59) 4. History of heartburn (  [id number]) 5. History of hematuria (Z87.448) 6. History of hypercholesterolemia (Z86.39) 7. History of Urinary Tract Infection Surgical  History Problems  1. History of Breast Surgery Lumpectomy 2. History of Dilation And Curettage 3. History of Shoulder Surgery 4. History of Umbilical Hernia Repair Current Meds 1. Amitriptyline HCl - 10 MG Oral Tablet; Therapy: (Recorded:07Jun2016) to Recorded 2. Calcium/Vitamin D/Minerals TABS; Therapy: (Recorded:07Jun2016) to Recorded 3. Gabapentin 100 MG Oral Capsule; Therapy: (Recorded:07Jun2016) to Recorded 4. Meclizine HCl - 12.5 MG Oral Tablet; Therapy: (Recorded:06Dec2016) to Recorded 5. Multi-Vitamin TABS; Therapy: (Recorded:07Jun2016) to Recorded 6. Omeprazole 20 MG Oral Tablet Delayed Release; Therapy: (Recorded:07Jun2016) to Recorded 7. Phenazopyridine HCl - 200 MG Oral Tablet; TAKE 1 TABLET 3 TIMES DAILY AS NEEDED FOR PAIN; Therapy: 27Dec2016 to (Evaluate:17Apr2017) Requested for: [id number]; Last Rx:08Mar2017 Ordered 8. Pravastatin Sodium 20 MG Oral Tablet; Therapy: (Recorded:07Jun2016) to Recorded 9. Stool Softener TABS; Therapy: (Recorded:07Jun2016) to Recorded Allergies Medication  1. Cipro TABS 2. Codeine Derivatives 3. Diclofenac Sodium TBEC 4. Flexeril TABS 5. Macrobid CAPS 6. Noroxin TABS 7. Relafen TABS 8. Sulfa Drugs Family History Problems  1. Family history of Colon Cancer : Maternal Uncle 2. Family history of Diabetes Mellitus 3. Family history of stroke (Z82.3) : Sister 4. Family history of Kidney Cancer : Mother Social History Problems  1. Denied: History of Alcohol Use (History) 2. Denied: History of Caffeine Use 3. Family history of Death In The Family Father 4. Family history of Death In The Family Mother 18. History of Marital History - Currently Married 6. Never a smoker 7. Denied: History of Tobacco Use 8. Widowed (Z63.4) Review of Systems Genitourinary, constitutional, skin, eye, otolaryngeal, hematologic/lymphatic, cardiovascular, pulmonary, endocrine, musculoskeletal, gastrointestinal, neurological and psychiatric system(s) were  reviewed and pertinent findings if present are noted and are otherwise negative.  Genitourinary: urinary frequency, urinary urgency, dysuria, nocturia, urinary hesitancy and bladder pain.  Gastrointestinal: heartburn.  Constitutional: feeling tired (fatigue) and recent ~Ulb weight loss.  Cardiovascular: leg swelling.  Respiratory: shortness of breath and cough.  Musculoskeletal: back pain and joint pain.  Neurological: dizziness.  Psychiatric: depression and anxiety.  Vitals Vital Signs [Data Includes: Last 1 Day]  Recorded: 18Apr2017 04:21PM  Blood Pressure: 124 / 82 Temperature: 98.4 F Heart Rate: 87 Physical Exam Constitutional: Well nourished and well developed . No acute distress.  ENT:. The ears and nose are normal in appearance.  Neck: The appearance of the neck is normal and no neck mass is present.  Pulmonary:. No accessory muscle use.  Cardiovascular:. No peripheral edema.  Abdomen: The abdomen is rounded. The abdomen is soft and nontender. No masses are palpated. The abdomen is normal to percussion. No CVA tenderness. No hernias are palpable. No hepatosplenomegaly noted.  Genitourinary:  Chaperone Present: kim lewis.  Examination of the external genitalia shows vulvar atrophy. The urethra is not tender, does not appear stenotic and no urethral caruncle. Urethral hypermobility is not present. There is no urethral discharge. No periurethral cyst is identified. There does appear to be urethral prolapse. Vaginal exam demonstrates atrophy and the vaginal epithelium to be poorly estrogenized, but no discharge, no tenderness and no uterine prolapse. A cystocele is present with a midline defect (grade 3 /4). No enterocele is identified. No rectocele is identified. The cervix is is without abnormalities. There is no cervical discharge. The uterus is without abnormalities and non tender. The adnexa are palpably normal. The bladder is normal on palpation and  non tender.  Lymphatics: The femoral and inguinal nodes are not enlarged or tender.  Skin: Normal skin turgor, no visible rash and no visible skin lesions.  Neuro/Psych:. Mood and affect are appropriate.  Results/Data Urine [Data Includes: Last 1 Day]   [id number]  COLOR YELLOW   APPEARANCE CLEAR   SPECIFIC GRAVITY <1.005   pH 6.0   GLUCOSE TRACE   BILIRUBIN NEGATIVE   KETONE NEGATIVE   BLOOD TRACE   PROTEIN TRACE   NITRITE POSITIVE   LEUKOCYTE ESTERASE 2+   SQUAMOUS EPITHELIAL/HPF 0-5 HPF  WBC 20-40 WBC/HPF  RBC 0-2 RBC/HPF  BACTERIA FEW HPF  CRYSTALS NONE SEEN HPF  CASTS NONE SEEN LPF  Yeast NONE SEEN HPF  Assessment Assessed  1. Cystocele, midline (N81.11) 2. Urge incontinence of urine (N39.41) 3. Female stress incontinence (N39.3) 4. Urinary tract infection (N39.0) 5. Urinary urgency (R39.15) ( 70 minutes) Natalie West is an 82 yo widowed female with uncomfortable Stage III anterior vault prolapse, palpable at the vaginal introitus, with associated SUI. She has had urodynamics, and we have reviewed her history today. She understands that because of her hx of breast cancer, she will not be given any vaginal estrogens, but she will need PT pre and post op. She will need augmented anterior vault prolapse repair with Kelly plication, and sacrospinous fixation, as well as Altis mid urethral sling ;  We have discussed surgery, anesthesia, and complications. She will have pig dermis placement, which will last for 1 year. She will not have mesh for her anterior vault. She will have a co-incidental Altis mesh mid-urethral sling placement. .  1. chronically abnormal u/a: wil c/s today. Hold antibiotic until c/s returns.  2. anterior vault POP, Stege III, with stress Urinary incontinence.  3. Urge incontinence  4. B/c of possible UTI, we wil c/s urine today.  Plan Health Maintenance  1. UA With REFLEX; [Do Not Release]; Status:Resulted - Requires Verification; Done:  Urinary tract  infection  2. URINE CULTURE; Status:In Progress - Specimen/Data Collected; Done:  Schedule overnight ( prefers [facility]), after anterior vault prolapse surgery and sling.  Begin PT Begin Miralax q day. Give bowel sheet.  Discussion/Summary cc: Dr. Kathryne Eriksson Signatures T   The information contained in this medical record document is considered private and confidential patient information. This information can only be used for the medical diagnosis and/or medical services that are being provided by the patient's selected caregivers. This information can only be distributed outside of the patient's care if the patient agrees and signs waivers of authorization for this information to be sent to an outside source or route.

## 2016-04-20 ENCOUNTER — Ambulatory Visit (HOSPITAL_COMMUNITY): Payer: PPO | Admitting: Certified Registered Nurse Anesthetist

## 2016-04-20 ENCOUNTER — Encounter (HOSPITAL_COMMUNITY)
Admission: RE | Disposition: A | Discharge status: Home / Self Care | Payer: Self-pay | Source: Ambulatory Visit | Attending: Urology

## 2016-04-20 ENCOUNTER — Observation Stay (HOSPITAL_COMMUNITY)
Admission: RE | Admit: 2016-04-20 | Discharge: 2016-04-21 | Disposition: A | Discharge status: Home / Self Care | Payer: PPO | Source: Ambulatory Visit | Attending: Urology | Admitting: Urology

## 2016-04-20 ENCOUNTER — Encounter (HOSPITAL_COMMUNITY): Payer: Self-pay | Admitting: Certified Registered Nurse Anesthetist

## 2016-04-20 DIAGNOSIS — N8110 Cystocele, unspecified: Secondary | ICD-10-CM | POA: Diagnosis not present

## 2016-04-20 DIAGNOSIS — K219 Gastro-esophageal reflux disease without esophagitis: Secondary | ICD-10-CM | POA: Insufficient documentation

## 2016-04-20 DIAGNOSIS — R3989 Other symptoms and signs involving the genitourinary system: Secondary | ICD-10-CM | POA: Diagnosis not present

## 2016-04-20 DIAGNOSIS — I4891 Unspecified atrial fibrillation: Secondary | ICD-10-CM | POA: Diagnosis not present

## 2016-04-20 DIAGNOSIS — N393 Stress incontinence (female) (male): Secondary | ICD-10-CM | POA: Diagnosis not present

## 2016-04-20 DIAGNOSIS — E7800 Pure hypercholesterolemia, unspecified: Secondary | ICD-10-CM | POA: Insufficient documentation

## 2016-04-20 DIAGNOSIS — I10 Essential (primary) hypertension: Secondary | ICD-10-CM | POA: Insufficient documentation

## 2016-04-20 DIAGNOSIS — IMO0002 Reserved for concepts with insufficient information to code with codable children: Secondary | ICD-10-CM | POA: Diagnosis present

## 2016-04-20 DIAGNOSIS — F329 Major depressive disorder, single episode, unspecified: Secondary | ICD-10-CM | POA: Insufficient documentation

## 2016-04-20 DIAGNOSIS — N3641 Hypermobility of urethra: Secondary | ICD-10-CM | POA: Diagnosis not present

## 2016-04-20 DIAGNOSIS — N8189 Other female genital prolapse: Secondary | ICD-10-CM | POA: Diagnosis not present

## 2016-04-20 DIAGNOSIS — N8111 Cystocele, midline: Secondary | ICD-10-CM | POA: Diagnosis not present

## 2016-04-20 DIAGNOSIS — Z79899 Other long term (current) drug therapy: Secondary | ICD-10-CM | POA: Diagnosis not present

## 2016-04-20 DIAGNOSIS — N390 Urinary tract infection, site not specified: Secondary | ICD-10-CM | POA: Diagnosis not present

## 2016-04-20 HISTORY — PX: PUBOVAGINAL SLING: SHX1035

## 2016-04-20 HISTORY — PX: CYSTOCELE REPAIR: SHX163

## 2016-04-20 LAB — CBC
HCT: 35.4 % — ABNORMAL LOW (ref 36.0–46.0)
Hemoglobin: 11.7 g/dL — ABNORMAL LOW (ref 12.0–15.0)
MCH: 31.8 pg (ref 26.0–34.0)
MCHC: 33.1 g/dL (ref 30.0–36.0)
MCV: 96.2 fL (ref 78.0–100.0)
Platelets: 142 10*3/uL — ABNORMAL LOW (ref 150–400)
RBC: 3.68 MIL/uL — ABNORMAL LOW (ref 3.87–5.11)
RDW: 12.6 % (ref 11.5–15.5)
WBC: 8.7 10*3/uL (ref 4.0–10.5)

## 2016-04-20 LAB — BASIC METABOLIC PANEL
Anion gap: 7 (ref 5–15)
BUN: 14 mg/dL (ref 6–20)
CO2: 26 mmol/L (ref 22–32)
Calcium: 8.1 mg/dL — ABNORMAL LOW (ref 8.9–10.3)
Chloride: 104 mmol/L (ref 101–111)
Creatinine, Ser: 0.84 mg/dL (ref 0.44–1.00)
GFR calc Af Amer: >60 mL/min (ref >60)
GFR calc non Af Amer: >60 mL/min (ref >60)
Glucose, Bld: 160 mg/dL — ABNORMAL HIGH (ref 65–99)
Potassium: 3.1 mmol/L — ABNORMAL LOW (ref 3.5–5.1)
Sodium: 137 mmol/L (ref 135–145)

## 2016-04-20 SURGERY — COLPORRHAPHY, ANTERIOR, FOR CYSTOCELE REPAIR
Anesthesia: General

## 2016-04-20 MED ORDER — PHENAZOPYRIDINE HCL 200 MG PO TABS
200.0000 mg | ORAL_TABLET | Freq: Every day | ORAL | Status: DC
  Administered 2016-04-20: 200 mg via ORAL
  Filled 2016-04-20 (×3): qty 1

## 2016-04-20 MED ORDER — METRONIDAZOLE 0.75 % VA GEL
VAGINAL | Status: DC | PRN
  Administered 2016-04-20: 1 via VAGINAL

## 2016-04-20 MED ORDER — SODIUM CHLORIDE 0.9 % IJ SOLN
INTRAMUSCULAR | Status: AC
  Filled 2016-04-20: qty 10

## 2016-04-20 MED ORDER — SODIUM CHLORIDE 0.9 % IV SOLN
10.0000 mg | INTRAVENOUS | Status: DC | PRN
  Administered 2016-04-20: 25 ug/min via INTRAVENOUS

## 2016-04-20 MED ORDER — EPHEDRINE SULFATE 50 MG/ML IJ SOLN
INTRAMUSCULAR | Status: DC | PRN
  Administered 2016-04-20 (×4): 10 mg via INTRAVENOUS

## 2016-04-20 MED ORDER — PHENYLEPHRINE 40 MCG/ML (10ML) SYRINGE FOR IV PUSH (FOR BLOOD PRESSURE SUPPORT)
PREFILLED_SYRINGE | INTRAVENOUS | Status: AC
  Filled 2016-04-20: qty 10

## 2016-04-20 MED ORDER — DEXTROSE IN LACTATED RINGERS 5 % IV SOLN
INTRAVENOUS | Status: DC
  Administered 2016-04-20 – 2016-04-21 (×3): via INTRAVENOUS

## 2016-04-20 MED ORDER — SUGAMMADEX SODIUM 200 MG/2ML IV SOLN
INTRAVENOUS | Status: AC
  Filled 2016-04-20: qty 2

## 2016-04-20 MED ORDER — POLYETHYLENE GLYCOL 3350 17 GM/SCOOP PO POWD
17.0000 g | Freq: Every day | ORAL | Status: DC
  Administered 2016-04-21: 17 g via ORAL
  Filled 2016-04-20: qty 255

## 2016-04-20 MED ORDER — PANTOPRAZOLE SODIUM 40 MG PO TBEC
40.0000 mg | DELAYED_RELEASE_TABLET | Freq: Every day | ORAL | Status: DC
  Administered 2016-04-20 – 2016-04-21 (×2): 40 mg via ORAL
  Filled 2016-04-20 (×2): qty 1

## 2016-04-20 MED ORDER — ATROPINE SULFATE 0.4 MG/ML IJ SOLN
INTRAMUSCULAR | Status: AC
  Filled 2016-04-20: qty 1

## 2016-04-20 MED ORDER — DEXAMETHASONE SODIUM PHOSPHATE 10 MG/ML IJ SOLN
INTRAMUSCULAR | Status: AC
  Filled 2016-04-20: qty 1

## 2016-04-20 MED ORDER — EPHEDRINE SULFATE 50 MG/ML IJ SOLN
INTRAMUSCULAR | Status: AC
  Filled 2016-04-20: qty 1

## 2016-04-20 MED ORDER — SENNOSIDES-DOCUSATE SODIUM 8.6-50 MG PO TABS
2.0000 | ORAL_TABLET | Freq: Every day | ORAL | Status: DC
  Administered 2016-04-20: 2 via ORAL
  Filled 2016-04-20: qty 2

## 2016-04-20 MED ORDER — PERPHENAZINE-AMITRIPTYLINE 2-10 MG PO TABS
1.0000 | ORAL_TABLET | Freq: Two times a day (BID) | ORAL | Status: DC
  Filled 2016-04-20 (×2): qty 1

## 2016-04-20 MED ORDER — ACETAMINOPHEN 160 MG/5ML PO SOLN
1000.0000 mg | Freq: Four times a day (QID) | ORAL | Status: DC | PRN
  Administered 2016-04-20: 1000 mg via ORAL
  Filled 2016-04-20: qty 40.6

## 2016-04-20 MED ORDER — HYDROMORPHONE HCL 2 MG PO TABS: 1.0000 mg | ORAL_TABLET | ORAL | Status: DC | PRN

## 2016-04-20 MED ORDER — GLYCOPYRROLATE 0.2 MG/ML IJ SOLN
INTRAMUSCULAR | Status: DC | PRN
  Administered 2016-04-20: 0.2 mg via INTRAVENOUS

## 2016-04-20 MED ORDER — DIPHENHYDRAMINE HCL 50 MG/ML IJ SOLN: 12.5000 mg | Freq: Four times a day (QID) | INTRAMUSCULAR | Status: DC | PRN

## 2016-04-20 MED ORDER — FENTANYL CITRATE (PF) 100 MCG/2ML IJ SOLN: 25.0000 ug | INTRAMUSCULAR | Status: DC | PRN

## 2016-04-20 MED ORDER — DEXAMETHASONE SODIUM PHOSPHATE 10 MG/ML IJ SOLN
INTRAMUSCULAR | Status: DC | PRN
  Administered 2016-04-20: 10 mg via INTRAVENOUS

## 2016-04-20 MED ORDER — PHENYLEPHRINE HCL 10 MG/ML IJ SOLN
INTRAMUSCULAR | Status: DC | PRN
Start: 2016-04-20 — End: 2016-04-20
  Administered 2016-04-20 (×2): 80 ug via INTRAVENOUS

## 2016-04-20 MED ORDER — METRONIDAZOLE 0.75 % VA GEL
1.0000 | Freq: Once | VAGINAL | Status: DC
  Filled 2016-04-20: qty 70

## 2016-04-20 MED ORDER — BACITRACIN ZINC 500 UNIT/GM EX OINT
TOPICAL_OINTMENT | CUTANEOUS | Status: AC
  Filled 2016-04-20: qty 28.35

## 2016-04-20 MED ORDER — FENTANYL CITRATE (PF) 100 MCG/2ML IJ SOLN
25.0000 ug | INTRAMUSCULAR | Status: DC | PRN
  Administered 2016-04-20: 25 ug via INTRAVENOUS

## 2016-04-20 MED ORDER — PROPOFOL 10 MG/ML IV BOLUS
INTRAVENOUS | Status: AC
  Filled 2016-04-20: qty 20

## 2016-04-20 MED ORDER — FLUORESCEIN SODIUM 10 % IV SOLN
INTRAVENOUS | Status: AC
  Filled 2016-04-20: qty 5

## 2016-04-20 MED ORDER — MEPERIDINE HCL 50 MG/ML IJ SOLN: 6.2500 mg | INTRAMUSCULAR | Status: DC | PRN

## 2016-04-20 MED ORDER — PRAVASTATIN SODIUM 20 MG PO TABS
20.0000 mg | ORAL_TABLET | Freq: Every day | ORAL | Status: DC
Start: 2016-04-20 — End: 2016-04-21
  Administered 2016-04-20: 20 mg via ORAL
  Filled 2016-04-20: qty 1

## 2016-04-20 MED ORDER — KETOROLAC TROMETHAMINE 30 MG/ML IJ SOLN
INTRAMUSCULAR | Status: AC
  Filled 2016-04-20: qty 1

## 2016-04-20 MED ORDER — SODIUM CHLORIDE 0.9 % IR SOLN
Status: AC
  Filled 2016-04-20: qty 1

## 2016-04-20 MED ORDER — SODIUM CHLORIDE 0.9% FLUSH: 3.0000 mL | INTRAVENOUS | Status: DC | PRN

## 2016-04-20 MED ORDER — SUGAMMADEX SODIUM 200 MG/2ML IV SOLN
INTRAVENOUS | Status: DC | PRN
  Administered 2016-04-20: 150 mg via INTRAVENOUS

## 2016-04-20 MED ORDER — FENTANYL CITRATE (PF) 100 MCG/2ML IJ SOLN
INTRAMUSCULAR | Status: AC
  Filled 2016-04-20: qty 2

## 2016-04-20 MED ORDER — SUCCINYLCHOLINE CHLORIDE 20 MG/ML IJ SOLN
INTRAMUSCULAR | Status: DC | PRN
  Administered 2016-04-20: 100 mg via INTRAVENOUS

## 2016-04-20 MED ORDER — STERILE WATER FOR IRRIGATION IR SOLN
Status: DC | PRN
  Administered 2016-04-20: 3000 mL

## 2016-04-20 MED ORDER — SODIUM CHLORIDE 0.9 % IV SOLN: 250.0000 mL | INTRAVENOUS | Status: DC | PRN

## 2016-04-20 MED ORDER — BUPIVACAINE-EPINEPHRINE 0.5% -1:200000 IJ SOLN
INTRAMUSCULAR | Status: DC | PRN
  Administered 2016-04-20: 30 mL

## 2016-04-20 MED ORDER — BUPIVACAINE-EPINEPHRINE 0.25% -1:200000 IJ SOLN
INTRAMUSCULAR | Status: AC
  Filled 2016-04-20: qty 1

## 2016-04-20 MED ORDER — ONDANSETRON HCL 4 MG/2ML IJ SOLN
4.0000 mg | INTRAMUSCULAR | Status: DC | PRN
  Administered 2016-04-20: 4 mg via INTRAVENOUS
  Filled 2016-04-20: qty 2

## 2016-04-20 MED ORDER — FENTANYL CITRATE (PF) 250 MCG/5ML IJ SOLN
INTRAMUSCULAR | Status: AC
  Filled 2016-04-20: qty 5

## 2016-04-20 MED ORDER — ACETAMINOPHEN 10 MG/ML IV SOLN
1000.0000 mg | Freq: Four times a day (QID) | INTRAVENOUS | Status: AC
Start: 2016-04-20 — End: 2016-04-21
  Administered 2016-04-20 – 2016-04-21 (×4): 1000 mg via INTRAVENOUS
  Filled 2016-04-20 (×6): qty 100

## 2016-04-20 MED ORDER — GLYCOPYRROLATE 0.2 MG/ML IJ SOLN
INTRAMUSCULAR | Status: AC
Start: 2016-04-20 — End: 2016-04-20
  Filled 2016-04-20: qty 1

## 2016-04-20 MED ORDER — SODIUM CHLORIDE 0.9 % IJ SOLN
INTRAMUSCULAR | Status: AC
  Filled 2016-04-20: qty 50

## 2016-04-20 MED ORDER — POLYMYXIN B SULFATE 500000 UNITS IJ SOLR
INTRAMUSCULAR | Status: DC | PRN
  Administered 2016-04-20: 500 mL

## 2016-04-20 MED ORDER — KETOROLAC TROMETHAMINE 30 MG/ML IJ SOLN
30.0000 mg | Freq: Once | INTRAMUSCULAR | Status: AC
  Administered 2016-04-20: 30 mg via INTRAVENOUS

## 2016-04-20 MED ORDER — SODIUM CHLORIDE 0.9% FLUSH
3.0000 mL | Freq: Two times a day (BID) | INTRAVENOUS | Status: DC
  Administered 2016-04-21: 3 mL via INTRAVENOUS

## 2016-04-20 MED ORDER — LIDOCAINE HCL (CARDIAC) 20 MG/ML IV SOLN
INTRAVENOUS | Status: DC | PRN
  Administered 2016-04-20: 100 mg via INTRAVENOUS

## 2016-04-20 MED ORDER — CEFAZOLIN SODIUM-DEXTROSE 2-4 GM/100ML-% IV SOLN
2.0000 g | INTRAVENOUS | Status: AC
  Administered 2016-04-20: 2 g via INTRAVENOUS
  Filled 2016-04-20: qty 100

## 2016-04-20 MED ORDER — ATENOLOL 25 MG PO TABS: 25.0000 mg | ORAL_TABLET | Freq: Every day | ORAL | Status: DC

## 2016-04-20 MED ORDER — ACETAMINOPHEN 500 MG PO TABS: 1000.0000 mg | ORAL_TABLET | Freq: Four times a day (QID) | ORAL | Status: DC | PRN

## 2016-04-20 MED ORDER — LIDOCAINE HCL (CARDIAC) 20 MG/ML IV SOLN
INTRAVENOUS | Status: AC
  Filled 2016-04-20: qty 5

## 2016-04-20 MED ORDER — CEFAZOLIN SODIUM 1-5 GM-% IV SOLN
1.0000 g | Freq: Three times a day (TID) | INTRAVENOUS | Status: AC
  Administered 2016-04-20 – 2016-04-21 (×2): 1 g via INTRAVENOUS
  Filled 2016-04-20 (×3): qty 50

## 2016-04-20 MED ORDER — BACITRACIN-NEOMYCIN-POLYMYXIN 400-5-5000 EX OINT: 1.0000 "application " | TOPICAL_OINTMENT | Freq: Three times a day (TID) | CUTANEOUS | Status: DC | PRN

## 2016-04-20 MED ORDER — FLUORESCEIN SODIUM 10 % IV SOLN
INTRAVENOUS | Status: DC | PRN
Start: 2016-04-20 — End: 2016-04-20
  Administered 2016-04-20: 50 mg via INTRAVENOUS

## 2016-04-20 MED ORDER — ESTRADIOL 0.1 MG/GM VA CREA
TOPICAL_CREAM | VAGINAL | Status: AC
  Filled 2016-04-20: qty 42.5

## 2016-04-20 MED ORDER — DIPHENHYDRAMINE HCL 12.5 MG/5ML PO ELIX: 12.5000 mg | ORAL_SOLUTION | Freq: Four times a day (QID) | ORAL | Status: DC | PRN

## 2016-04-20 MED ORDER — ROCURONIUM BROMIDE 100 MG/10ML IV SOLN
INTRAVENOUS | Status: AC
  Filled 2016-04-20: qty 1

## 2016-04-20 MED ORDER — MECLIZINE HCL 12.5 MG PO TABS
12.5000 mg | ORAL_TABLET | Freq: Two times a day (BID) | ORAL | Status: DC
  Administered 2016-04-20 – 2016-04-21 (×3): 12.5 mg via ORAL
  Filled 2016-04-20 (×4): qty 1

## 2016-04-20 MED ORDER — FENTANYL CITRATE (PF) 100 MCG/2ML IJ SOLN
INTRAMUSCULAR | Status: DC | PRN
  Administered 2016-04-20 (×8): 25 ug via INTRAVENOUS

## 2016-04-20 MED ORDER — ONDANSETRON HCL 4 MG/2ML IJ SOLN
INTRAMUSCULAR | Status: AC
  Filled 2016-04-20: qty 2

## 2016-04-20 MED ORDER — CEFAZOLIN SODIUM-DEXTROSE 2-4 GM/100ML-% IV SOLN
INTRAVENOUS | Status: AC
  Filled 2016-04-20: qty 100

## 2016-04-20 MED ORDER — HYDROCHLOROTHIAZIDE 12.5 MG PO CAPS
12.5000 mg | ORAL_CAPSULE | Freq: Every day | ORAL | Status: DC
  Administered 2016-04-21: 12.5 mg via ORAL
  Filled 2016-04-20: qty 1

## 2016-04-20 MED ORDER — ACETAMINOPHEN 160 MG/5ML PO SOLN: 650.0000 mg | Freq: Four times a day (QID) | ORAL | Status: DC | PRN

## 2016-04-20 MED ORDER — ONDANSETRON HCL 4 MG/2ML IJ SOLN
INTRAMUSCULAR | Status: DC | PRN
  Administered 2016-04-20: 4 mg via INTRAVENOUS

## 2016-04-20 MED ORDER — CETYLPYRIDINIUM CHLORIDE 0.05 % MT LIQD
7.0000 mL | Freq: Two times a day (BID) | OROMUCOSAL | Status: DC
  Administered 2016-04-20 – 2016-04-21 (×2): 7 mL via OROMUCOSAL

## 2016-04-20 MED ORDER — METOCLOPRAMIDE HCL 5 MG/ML IJ SOLN: 10.0000 mg | Freq: Once | INTRAMUSCULAR | Status: DC | PRN

## 2016-04-20 MED ORDER — BUPIVACAINE-EPINEPHRINE (PF) 0.5% -1:200000 IJ SOLN
INTRAMUSCULAR | Status: AC
  Filled 2016-04-20: qty 30

## 2016-04-20 MED ORDER — PROPOFOL 10 MG/ML IV BOLUS
INTRAVENOUS | Status: DC | PRN
  Administered 2016-04-20: 100 mg via INTRAVENOUS

## 2016-04-20 MED ORDER — LACTATED RINGERS IV SOLN
INTRAVENOUS | Status: DC
  Administered 2016-04-20 (×2): via INTRAVENOUS

## 2016-04-20 MED ORDER — ROCURONIUM BROMIDE 100 MG/10ML IV SOLN
INTRAVENOUS | Status: DC | PRN
  Administered 2016-04-20: 10 mg via INTRAVENOUS
  Administered 2016-04-20: 40 mg via INTRAVENOUS
  Administered 2016-04-20: 10 mg via INTRAVENOUS

## 2016-04-20 MED ORDER — GABAPENTIN 100 MG PO CAPS: 100.0000 mg | ORAL_CAPSULE | Freq: Two times a day (BID) | ORAL | Status: DC | PRN

## 2016-04-20 MED ORDER — PERPHENAZINE 2 MG PO TABS
2.0000 mg | ORAL_TABLET | Freq: Two times a day (BID) | ORAL | Status: DC
  Administered 2016-04-20 – 2016-04-21 (×2): 2 mg via ORAL
  Filled 2016-04-20 (×3): qty 1

## 2016-04-20 MED ORDER — AMITRIPTYLINE HCL 10 MG PO TABS
10.0000 mg | ORAL_TABLET | Freq: Two times a day (BID) | ORAL | Status: DC
  Administered 2016-04-20 – 2016-04-21 (×2): 10 mg via ORAL
  Filled 2016-04-20 (×3): qty 1

## 2016-04-20 MED ORDER — PHENYLEPHRINE HCL 10 MG/ML IJ SOLN
INTRAMUSCULAR | Status: AC
  Filled 2016-04-20: qty 1

## 2016-04-20 MED ORDER — LACTATED RINGERS IV BOLUS (SEPSIS)
500.0000 mL | Freq: Once | INTRAVENOUS | Status: AC
  Administered 2016-04-20: 500 mL via INTRAVENOUS

## 2016-04-20 SURGICAL SUPPLY — 71 items
ALLOGRAFT TIS AXIS TUTPLST 4X7 (Mesh General) ×1 IMPLANT
BAG DECANTER FOR FLEXI CONT (MISCELLANEOUS) IMPLANT
BAG URINE DRAINAGE (UROLOGICAL SUPPLIES) ×2 IMPLANT
BLADE HEX COATED 2.75 (ELECTRODE) IMPLANT
BLADE SURG 15 STRL LF DISP TIS (BLADE) ×1 IMPLANT
BLADE SURG 15 STRL SS (BLADE) ×2
BLADE SURG SZ10 CARB STEEL (BLADE) ×2 IMPLANT
CATH BONANNO SUPRAPUBIC 14G (CATHETERS) IMPLANT
CATH FOLEY 2WAY SLVR  5CC 14FR (CATHETERS)
CATH FOLEY 2WAY SLVR  5CC 16FR (CATHETERS) ×1
CATH FOLEY 2WAY SLVR  5CC 18FR (CATHETERS)
CATH FOLEY 2WAY SLVR 5CC 14FR (CATHETERS) IMPLANT
CATH FOLEY 2WAY SLVR 5CC 16FR (CATHETERS) ×1 IMPLANT
CATH FOLEY 2WAY SLVR 5CC 18FR (CATHETERS) IMPLANT
COVER MAYO STAND STRL (DRAPES) ×2 IMPLANT
COVER SURGICAL LIGHT HANDLE (MISCELLANEOUS) ×2 IMPLANT
DECANTER SPIKE VIAL GLASS SM (MISCELLANEOUS) IMPLANT
DEVICE CAPIO SLIM SINGLE (INSTRUMENTS) ×2 IMPLANT
DRAIN PENROSE 18X1/2 LTX STRL (DRAIN) IMPLANT
DRAPE UTILITY 15X26 (DRAPE) ×2 IMPLANT
ELECT PENCIL ROCKER SW 15FT (MISCELLANEOUS) ×2 IMPLANT
ELECT REM PT RETURN 9FT ADLT (ELECTROSURGICAL) ×2
ELECTRODE REM PT RTRN 9FT ADLT (ELECTROSURGICAL) ×1 IMPLANT
GAUZE SPONGE 4X4 16PLY XRAY LF (GAUZE/BANDAGES/DRESSINGS) ×2 IMPLANT
GLOVE BIOGEL M STRL SZ7.5 (GLOVE) ×6 IMPLANT
GOWN STRL REUS W/TWL LRG LVL3 (GOWN DISPOSABLE) ×4 IMPLANT
GOWN STRL REUS W/TWL XL LVL3 (GOWN DISPOSABLE) ×4 IMPLANT
HOLDER FOLEY CATH W/STRAP (MISCELLANEOUS) IMPLANT
IV NS 1000ML (IV SOLUTION)
IV NS 1000ML BAXH (IV SOLUTION) IMPLANT
KIT BASIN OR (CUSTOM PROCEDURE TRAY) ×2 IMPLANT
LIQUID BAND (GAUZE/BANDAGES/DRESSINGS) IMPLANT
NEEDLE HYPO 22GX1.5 SAFETY (NEEDLE) ×2 IMPLANT
NEEDLE MAYO 6 CRC TAPER PT (NEEDLE) IMPLANT
NEEDLE SPNL 22GX3.5 QUINCKE BK (NEEDLE) ×2 IMPLANT
NEEDLE SPNL 22GX7 QUINCKE BK (NEEDLE) ×2 IMPLANT
NS IRRIG 1000ML POUR BTL (IV SOLUTION) IMPLANT
PACK CYSTO (CUSTOM PROCEDURE TRAY) ×2 IMPLANT
PACKING VAGINAL (PACKING) ×2 IMPLANT
PAK SCROTO (SET/KITS/TRAYS/PACK) ×2 IMPLANT
PLUG CATH AND CAP STER (CATHETERS) ×2 IMPLANT
RETRACTOR LONRSTAR 16.6X16.6CM (MISCELLANEOUS) ×1 IMPLANT
RETRACTOR STAY HOOK 5MM (MISCELLANEOUS) ×2 IMPLANT
RETRACTOR STER APS 16.6X16.6CM (MISCELLANEOUS) ×2
SCRUB PCMX 4 OZ (MISCELLANEOUS) IMPLANT
SHEET LAVH (DRAPES) ×2 IMPLANT
SLING ALTIS SYSTEM (Sling) ×2 IMPLANT
SPONGE LAP 4X18 X RAY DECT (DISPOSABLE) ×2 IMPLANT
SUCTION FRAZIER HANDLE 12FR (TUBING) ×1
SUCTION TUBE FRAZIER 12FR DISP (TUBING) ×1 IMPLANT
SUT CAPIO ETHIBPND (SUTURE) ×2 IMPLANT
SUT VIC AB 0 CT1 27 (SUTURE)
SUT VIC AB 0 CT1 27XBRD ANTBC (SUTURE) IMPLANT
SUT VIC AB 2-0 CT1 27 (SUTURE)
SUT VIC AB 2-0 CT1 TAPERPNT 27 (SUTURE) IMPLANT
SUT VIC AB 2-0 SH 27 (SUTURE)
SUT VIC AB 2-0 SH 27X BRD (SUTURE) IMPLANT
SUT VIC AB 2-0 UR5 27 (SUTURE) IMPLANT
SUT VIC AB 2-0 UR6 27 (SUTURE) ×10 IMPLANT
SUT VIC AB 3-0 PS2 18 (SUTURE)
SUT VIC AB 3-0 PS2 18XBRD (SUTURE) IMPLANT
SUT VIC AB 3-0 SH 27 (SUTURE)
SUT VIC AB 3-0 SH 27X BRD (SUTURE) IMPLANT
SUT VICRYL 0 UR6 27IN ABS (SUTURE) IMPLANT
SYRINGE 10CC LL (SYRINGE) ×2 IMPLANT
TISSUE AXIS TUTOPLAST 4X7 (Mesh General) ×2 IMPLANT
TOWEL OR 17X26 10 PK STRL BLUE (TOWEL DISPOSABLE) ×2 IMPLANT
TOWEL OR NON WOVEN STRL DISP B (DISPOSABLE) ×2 IMPLANT
TUBING CONNECTING 10 (TUBING) ×2 IMPLANT
WATER STERILE IRR 1500ML POUR (IV SOLUTION) IMPLANT
YANKAUER SUCT BULB TIP 10FT TU (MISCELLANEOUS) ×2 IMPLANT

## 2016-04-20 NOTE — Anesthesia Procedure Notes (Signed)
Procedure Name: Intubation Date/Time: 04/20/2016 9:00 AM Performed by: Maxwell Caul Pre-anesthesia Checklist: Patient identified, Emergency Drugs available, Suction available and Patient being monitored Patient Re-evaluated:Patient Re-evaluated prior to inductionOxygen Delivery Method: Circle system utilized Preoxygenation: Pre-oxygenation with 100% oxygen Intubation Type: IV induction Ventilation: Mask ventilation without difficulty Laryngoscope Size: Mac and 4 Grade View: Grade I Tube type: Oral Tube size: 7.0 mm Number of attempts: 1 Airway Equipment and Method: Stylet and Oral airway Placement Confirmation: ETT inserted through vocal cords under direct vision,  positive ETCO2 and breath sounds checked- equal and bilateral Secured at: 21 cm Tube secured with: Tape Dental Injury: Teeth and Oropharynx as per pre-operative assessment

## 2016-04-20 NOTE — Anesthesia Postprocedure Evaluation (Signed)
Anesthesia Post Note  Patient: Natalie West  Procedure(s) Performed: Procedure(s) (LRB): AUGMENTED ANTERIOR VAULT REPAIR, COLOPLAST DERMIS GRAFT KELLY PLICATION SACROSPINOUS FIXATION (N/A) PUBO-VAGINAL ALTIS SLING (N/A)  Patient location during evaluation: PACU Anesthesia Type: General Level of consciousness: awake and alert Pain management: pain level controlled Vital Signs Assessment: post-procedure vital signs reviewed and stable Respiratory status: spontaneous breathing, nonlabored ventilation, respiratory function stable and patient connected to nasal cannula oxygen Cardiovascular status: blood pressure returned to baseline and stable Postop Assessment: no signs of nausea or vomiting Anesthetic complications: no    Last Vitals:  Filed Vitals:   04/20/16 1330 04/20/16 1345  BP: 101/61 99/63  Pulse: 85 75  Temp:    Resp: 21 17    Last Pain:  Filed Vitals:   04/20/16 1350  PainSc: 2                  Montez Hageman

## 2016-04-20 NOTE — Transfer of Care (Signed)
Immediate Anesthesia Transfer of Care Note  Patient: Natalie West  Procedure(s) Performed: Procedure(s): AUGMENTED ANTERIOR VAULT REPAIR, COLOPLAST DERMIS GRAFT KELLY PLICATION SACROSPINOUS FIXATION (N/A) PUBO-VAGINAL ALTIS SLING (N/A)  Patient Location: PACU  Anesthesia Type:General  Level of Consciousness:  sedated, patient cooperative and responds to stimulation  Airway & Oxygen Therapy:Patient Spontanous Breathing and Patient connected to face mask oxgen  Post-op Assessment:  Report given to PACU RN and Post -op Vital signs reviewed and stable  Post vital signs:  Reviewed and stable  Last Vitals:  Filed Vitals:   04/20/16 0701  BP: 115/74  Pulse: 60  Temp: 36.4 C  Resp: 16    Complications: No apparent anesthesia complications

## 2016-04-20 NOTE — Progress Notes (Signed)
Day of Surgery   Subjective: The patient is doing well.  Some nausea which has resolved. Pain is adequately controlled. SBP is a little soft in 80s with only 75 cc UOP over the past 3-4 hours. No dizziness, chest pain, light headedness.  Objective: Vital signs in last 24 hours: Temp:  [97.5 F (36.4 C)-98.2 F (36.8 C)] 97.5 F (36.4 C) (06/19 1420) Pulse Rate:  [60-93] 76 (06/19 1400) Resp:  [15-21] 17 (06/19 1420) BP: (99-117)/(61-79) 108/68 mmHg (06/19 1420) SpO2:  [95 %-98 %] 95 % (06/19 1420) Weight:  [65.318 kg (144 lb)] 65.318 kg (144 lb) (06/19 0719)  Intake/Output from previous day:   Intake/Output this shift: Total I/O In: 1550 [I.V.:1550] Out: 675 [Urine:575; Blood:100]  Physical Exam:  General: Alert and oriented. CV: RRR Lungs: Clear bilaterally. GI: Soft, Nondistended. GU: Vaginal packing in place Urine: Clear yellow Extremities: Nontender, no erythema, no edema.  Lab Results:  Recent Labs  04/20/16 1303  HGB 11.7*  HCT 35.4*      Assessment/Plan: POD#0 s/p cystocele repair with dermis graft + Altis sling, recovering well  1) Bolus 500 cc LR 2) Hold atenolol for hypotension 3) Closely monitor BP and UOP 4) Advance to regular diet 5) Increase mIVF to 125 6) Ambulate, Incentive spirometry 7) PRN PO and IV pain medications 8) Remove vaginal packing & foley at 0600 tomorrow 9) Labs tomorrow 10) Plan for likely discharge tomorrow after passes TOV    Acie Fredrickson 04/20/2016, 5:37 PM

## 2016-04-20 NOTE — Interval H&P Note (Signed)
History and Physical Interval Note:  04/20/2016 8:49 AM  Natalie West  has presented today for surgery, with the diagnosis of ANTERIOR PELVIC ORGAN PROLAPSE, STRESS URINARY INCONTINENCE  The various methods of treatment have been discussed with the patient and family. After consideration of risks, benefits and other options for treatment, the patient has consented to  Procedure(s): AUGMENTED ANTERIOR VAULT REPAIR, COLOPLAST DERMIS GRAFT KELLY PLICATION SACROSPINOUS FIXATION (N/A) PUBO-VAGINAL ALTIS SLING (N/A) as a surgical intervention .  The patient's history has been reviewed, patient examined, no change in status, stable for surgery.  I have reviewed the patient's chart and labs.  Questions were answered to the patient's satisfaction.     Sherese Heyward I Paquita Printy

## 2016-04-20 NOTE — Op Note (Signed)
Operative Note:  Pre-operative diagnosis: Pelvic floor prolapse with urinary incontinence, with anterior vault prolapse, urethral hypermobility.  Postoperative diagnosis: Pelvic floor prolapse with urinary incontinence, with anterior vault prolapse, urethral hypermobility.  Operation: Anterior vault sacrospinous fixation using dermal graft repair, and Altis midurethral sling.    * AUGMENTED ANTERIOR VAULT REPAIR, COLOPLAST DERMIS GRAFT KELLY PLICATION SACROSPINOUS FIXATION - General    * PUBO-VAGINAL ALTIS SLING - General   Surgeon:  Carolan Clines, MD  Resident: E. Harvel Ricks, MD  Implants:    Implant Name Type Inv. Item Serial No. Manufacturer Lot No. LRB No. Used  TISSUE AXIS TUTOPLAST 4X7 - [id number] Mesh General TISSUE AXIS TUTOPLAST 4X7 [id number] COLOPLAST [id number] N/A 1  SLING ALTIS SYSTEM - [id number] Sling SLING [place] [id number] COLOPLAST [id number] N/A 1   Drains: 14 F foley catheter   Complications: None  Anesthesia:  General LMA  Preparation: After appropriate preanesthesia, the patient was brought to the operating room, placed on the operating table in the dorsal supine position. She received IV ancef, 1 g of IV Tylenol. Timeout was observed.  Indications: Please see prior H&P for history  Procedure: Inspection of the vaginal vault reveals a grade 3 cystocele and a small urethral caruncle.   The [facility] retractor is placed, and a 67F foley catheter was placed. Using a blue marking pen, the bladder neck is identified, the mid urethra was also identified and marked. We identified our intended area of incision for the cystocele repair which in the midline was approximately 2 cm proximal to the urethrovaginal junction. This is marked with a semi-lunar horizontal line with a blue pen. 60 cc of lidocaine 0.5% with epinephrine 1-200,000 diluted in normal saline is then injected in the proposed incision  site to serve as hydrodissection to the level of the ischial spines, as well as for hemostasis.  Using a 15 blade, the horizontal semilunar incision was made. We sharply and bluntly dissected the overlying vaginal mucosa from the underlying pubocervical fascia to the white line bilaterally.  This was dissected laterally to the level of the ischial spines, proximally to 1 cm distal to the vaginal cuff and 1 cm distally to the bladder neck. With the bladder empty we next identified and cleared off the ischial spines bilaterally with finger dissection and also defined the sacrospinous ligaments. Next, we performed a standard Kelly plication, using interrupted 2-0 Vicryl sutures, placed in horizontal mattress fashion with excellent reduction of the cystocele. We did not shorten the anterior vaginal wall or plicate the bladder neck.   Next, using the Cappio device, we placed an 0 Ethibond through the sacrospinous ligaments bilaterally. Sutures were placed one finger breath medial to the ischial spines bilaterally, and their location was palpated for accuracy. Next we pre-placed four separate 2-0 vicryl sutures through the pubocervical fascia - one proximally in the midline, one distally in the midline and one on each side of the distal midline suture.   I then cut a 7 x 4 Coloplast Axis dermal graft in the shape of a trapezoid.  The needle was then passed through the graft in each corresponding area and we then utilized a free needle to pass the second end of each suture through the graft as well so it would lie flat when we sutured it in place. We simultaneously tied down the sutures within the sacrospinous ligament which demonstrated excellent support. The remainder of the anchoring sutures were tied down with excellent cosmetic effect and the graft sat in  a flat position and was tension free. We did place a second midline anchoring stitch distally to ensure full coverage of the Kelly plication with the dermis  graft.  We noted there was a large amount of redundant vaginal mucosa with the cystocele reduced. The excess proximal vaginal mucosa was amputated using a combination of sharp dissection and Bovie electrocautery. There was excellent cosmetic effect with only a minimal amount of redundant tissue and no significant vaginal shortening. We then closed the anterior vaginal wall in an inverted U shape with two running 2-0 Vicryls. She had excellent vaginal length.  We then turned our attention to placement of the Altis sling. The mid-urethra was marked with a blue pen. 5 cc of Marcaine solution was injected bilaterally for hydrodissection. The 2 cm vaginal incision over the mid urethra is made with a 15 blade. Subcutaneous tissue is dissected, bilaterally. Dissection is carried out towards the pelvic sidewall towards the obturator membrane just inferior to the insertion point of the adductor longus tendon.   The Altis sling is secured to the passing trocar in standard fashion first on the right side and then the second dynamic tensioning side was also passed with the left sided trocar. We examined the sling and noted that the sling sat in an appropriate flat submucosal position. We examined both vaginal fornices and confirmed this had been properly placed.    Next, Irrigation of the vagina was accomplished and we re-filled the bladder and tensioned the sling via the dynamic tensioning sling while testing valsalva. Given her history of low voiding pressures and incomplete emptying we left the sling slightly looser then usual and tensioned it just enough to prevent significant stress urinary incontinence with moderate valsalva with a moderately full bladder. We assessed the sling and it was smooth against the urethra, but not tight. We were able to pass the end of a right angle between the urethra and sling and without resistance. We then loosened it a bit more then this.   I then cystoscoped the patient. There  was no distortion of the anatomy or injury to the bladder or urethra. She had excellent yellow jets bilaterally. Following cystoscopy, Foley catheter was replaced.    Minimal bleeding was noted.  We then closed the midline vertical vaginal incision making sure not to incorporate the sling with a single running 2-0 Vicryl suture.  Vaginal packing was placed using estrogen and a vaginal packing, and Foley was left to straight drainage. The patient received IV Tylenol, was awakened, and taken to recovery room in good condition.  Attestation: Dr. Gaynelle Arabian was present and scrubbed for the entirety of the procedure.

## 2016-04-20 NOTE — Anesthesia Preprocedure Evaluation (Addendum)
Anesthesia Evaluation  Patient identified by MRN, date of birth, ID band Patient awake    Reviewed: Allergy & Precautions, NPO status , Patient's Chart, lab work & pertinent test results  History of Anesthesia Complications (+) PONV  Airway Mallampati: II  TM Distance: >3 FB Neck ROM: Full    Dental no notable dental hx.    Pulmonary neg pulmonary ROS,    Pulmonary exam normal breath sounds clear to auscultation       Cardiovascular hypertension, Pt. on medications Normal cardiovascular exam+ dysrhythmias Atrial Fibrillation  Rhythm:Regular Rate:Normal     Neuro/Psych negative neurological ROS  negative psych ROS   GI/Hepatic Neg liver ROS, GERD  Medicated and Controlled,  Endo/Other  negative endocrine ROS  Renal/GU negative Renal ROS  negative genitourinary   Musculoskeletal negative musculoskeletal ROS (+)   Abdominal   Peds negative pediatric ROS (+)  Hematology negative hematology ROS (+)   Anesthesia Other Findings   Reproductive/Obstetrics negative OB ROS                            Anesthesia Physical Anesthesia Plan  ASA: III  Anesthesia Plan: General   Post-op Pain Management:    Induction: Intravenous  Airway Management Planned: Oral ETT  Additional Equipment:   Intra-op Plan:   Post-operative Plan: Extubation in OR  Informed Consent: I have reviewed the patients History and Physical, chart, labs and discussed the procedure including the risks, benefits and alternatives for the proposed anesthesia with the patient or authorized representative who has indicated his/her understanding and acceptance.   Dental advisory given  Plan Discussed with: CRNA  Anesthesia Plan Comments:         Anesthesia Quick Evaluation

## 2016-04-21 DIAGNOSIS — N8189 Other female genital prolapse: Secondary | ICD-10-CM | POA: Diagnosis not present

## 2016-04-21 LAB — CBC
HCT: 29.6 % — ABNORMAL LOW (ref 36.0–46.0)
Hemoglobin: 9.7 g/dL — ABNORMAL LOW (ref 12.0–15.0)
MCH: 31.6 pg (ref 26.0–34.0)
MCHC: 32.8 g/dL (ref 30.0–36.0)
MCV: 96.4 fL (ref 78.0–100.0)
Platelets: 139 10*3/uL — ABNORMAL LOW (ref 150–400)
RBC: 3.07 MIL/uL — ABNORMAL LOW (ref 3.87–5.11)
RDW: 12.7 % (ref 11.5–15.5)
WBC: 12.6 10*3/uL — ABNORMAL HIGH (ref 4.0–10.5)

## 2016-04-21 LAB — BASIC METABOLIC PANEL
Anion gap: 6 (ref 5–15)
BUN: 15 mg/dL (ref 6–20)
CO2: 25 mmol/L (ref 22–32)
Calcium: 8 mg/dL — ABNORMAL LOW (ref 8.9–10.3)
Chloride: 102 mmol/L (ref 101–111)
Creatinine, Ser: 0.82 mg/dL (ref 0.44–1.00)
GFR calc Af Amer: >60 mL/min (ref >60)
GFR calc non Af Amer: >60 mL/min (ref >60)
Glucose, Bld: 197 mg/dL — ABNORMAL HIGH (ref 65–99)
Potassium: 3.4 mmol/L — ABNORMAL LOW (ref 3.5–5.1)
Sodium: 133 mmol/L — ABNORMAL LOW (ref 135–145)

## 2016-04-21 MED ORDER — CEPHALEXIN 500 MG PO CAPS: 500.0000 mg | ORAL_CAPSULE | Freq: Three times a day (TID) | ORAL | Status: DC

## 2016-04-21 MED ORDER — HYDROMORPHONE HCL 2 MG PO TABS: 1.0000 mg | ORAL_TABLET | ORAL | Status: DC | PRN

## 2016-04-21 MED ORDER — DOCUSATE SODIUM 100 MG PO CAPS: 100.0000 mg | ORAL_CAPSULE | Freq: Two times a day (BID) | ORAL | Status: DC

## 2016-04-21 NOTE — Progress Notes (Signed)
Pt attemped voiding with no success. Bladder scan revealed 253mL.

## 2016-04-21 NOTE — Progress Notes (Signed)
Discharge instructions given and all questions answered. Foley placed and instructions on foley care reviewed. Pt will be discharged with daughter via private vehicle.

## 2016-04-21 NOTE — Progress Notes (Signed)
Paged Dr. Hartley Barefoot about bladder scan and no voiding since foley removal.

## 2016-04-21 NOTE — Care Management Note (Signed)
Case Management Note  Patient Details  Name: Natalie West MRN: [id number] Date of Birth: July 25, 1931  Subjective/Objective:                    Action/Plan:d/c home no needs or orders.   Expected Discharge Date:                  Expected Discharge Plan:  Home/Self Care  In-House Referral:     Discharge planning Services  CM Consult  Post Acute Care Choice:    Choice offered to:     DME Arranged:    DME Agency:     HH Arranged:    [place] Agency:     Status of Service:  Completed, signed off  If discussed at [employer] of Stay Meetings, dates discussed:    Additional Comments:  Dessa Phi, RN 04/21/2016, 1:58 PM

## 2016-04-21 NOTE — Discharge Summary (Signed)
Physician Discharge Summary  Patient ID: KAGAN RABELO MRN: [id number] DOB/AGE: 04/19/1931 82 y.o.  Admit date: 04/20/2016 Discharge date: 04/21/2016  Admission Diagnoses:  1. Cystocele 2. Stress urinary incontinence  Discharge Diagnoses:  Active Problems:   Cystocele  Discharged Condition: good  Hospital Course:  82 yo female who is s/p cytocele repair with dermis graft sacrospinus fixation and midurethral mesh sling placement. She did well post-operatively. Her foley catheter was removed the morning of POD#1, however, she failed her TOV and a 58F foley catheter was inserted for an elevated post-void residual. Her diet was slowly advanced and at the time of discharge she was tolerating a regular diet, ambulating at her baseline, making excellent UOP via replaced foley catheter, vaginal packing had been removed and pain was well controlled with oral narcotics. She was discharged to home on POD#1.  Consults: None  Significant Diagnostic Studies: None  Treatments: surgery: cytocele repair with dermis graft sacrospinus fixation and midurethral mesh sling placement  Discharge Exam: Blood pressure 92/61, pulse 77, temperature 98 F (36.7 C), temperature source Oral, resp. rate 14, height 5\' 1"  (1.549 m), weight 65.318 kg (144 lb), last menstrual period 11/03/1983, SpO2 94 %. General: Alert and oriented. CV: RRR Lungs: Clear bilaterally. GI: Soft, Nondistended. GU: Vaginal packing removed with healthy appearing vaginal mucosa.  Foley: Clear yellow urine  Extremities: Nontender, no erythema, no edema.  Disposition: 01-Home or Self Care     Medication List    STOP taking these medications        atenolol 25 MG tablet  Commonly known as:  TENORMIN      TAKE these medications        CALCIUM 600 + D PO  Take 1 tablet by mouth daily.     cephALEXin 500 MG capsule  Commonly known as:  KEFLEX  Take 1 capsule (500 mg total) by mouth 3 (three) times daily.     docusate sodium 100  MG capsule  Commonly known as:  COLACE  Take 1 capsule (100 mg total) by mouth 2 (two) times daily. Hold for diarrhea.     gabapentin 100 MG capsule  Commonly known as:  NEURONTIN  Take 100 mg by mouth 2 (two) times daily as needed (For pain.).     hydrochlorothiazide 12.5 MG capsule  Commonly known as:  MICROZIDE  Take 12.5 mg by mouth daily.     HYDROmorphone 2 MG tablet  Commonly known as:  DILAUDID  Take 0.5 tablets (1 mg total) by mouth every 4 (four) hours as needed for moderate pain or severe pain.     meclizine 12.5 MG tablet  Commonly known as:  ANTIVERT  Take 12.5 mg by mouth 2 (two) times daily.     multivitamin with minerals Tabs tablet  Take 1 tablet by mouth daily.     omeprazole 20 MG capsule  Commonly known as:  PRILOSEC  Take 20 mg by mouth daily as needed (For heartburn or acid reflux.).     perphenazine-amitriptyline 2-10 MG Tabs tablet  Commonly known as:  ETRAFON/TRIAVIL  Take 1 tablet by mouth 2 (two) times daily.     phenazopyridine 200 MG tablet  Commonly known as:  PYRIDIUM  Take 200 mg by mouth at bedtime.     polyethylene glycol powder powder  Commonly known as:  GLYCOLAX/MIRALAX  Take 17 g by mouth daily.     pravastatin 20 MG tablet  Commonly known as:  PRAVACHOL  Take 20 mg by mouth at  bedtime.       Follow-up Information    Call Ailene Rud, MD.   Specialty:  Urology   Why:  Follow up early next week with Nurse Practitioner for foley catheter removal   Contact information:   [address] [place] [address] [phone]       Signed: Acie Fredrickson 04/21/2016, 4:10 PM

## 2016-04-21 NOTE — Discharge Instructions (Signed)
As discussed with Dr. Gaynelle Arabian pre-operatively.  You may resume aspirin, advil, aleve, vitamins, and supplements 5 days after surgery.  Activity:  You are encouraged to ambulate frequently (about every hour during waking hours) to help prevent blood clots from forming in your legs or lungs.  However, you should not engage in any heavy lifting (> 10-15 lbs), strenuous activity, or straining for 6 weeks. It is OK to start driving when you are off narcotic pain medications. No intercourse or placing anything within the vagina for 6 weeks.   Diet: You may resume a regular diet.  Prescriptions: Complete the course of antibiotics (Keflex) which was prescribed to you. You will be provided a prescription for pain medication to take as needed.  If your pain is not severe enough to require the prescription pain medication, you may take extra strength Tylenol instead which will have less side effects.  You should also take an over the counter stool softener to avoid straining with bowel movements as the prescription pain medication may constipate you. Do not drive while taking narcotic pain medications. Take Colace and/or Senna while taking narcotic pain medication. You may take over-the-counter Laxatives such as Miralax, Senna, or Milk of Magnesia. Stop taking these medications if you develop diarrhea.  You may start showering (but not soaking or bathing in water) the 2nd day after surgery and the incisions simply need to be patted dry after the shower. No baths or submerging for 2 weeks. Do not scrub over the incisions, simply let soap and water run over them.  Do not soak the incision (such as a bath) for 2 weeks after surgery.  No additional care is needed.  What to call us about: You should call the office [phone]) if you develop fever > 101, significant bleeding, develop persistent vomiting.  Resume home medications as prescribed by your primary care physician.  It is normal to have some vaginal  spotting and bleeding for 1-2 weeks following this surgery.  Foley catheter: You have been discharged with a foley catheter in place. Please care of this as instructed by your discharge nurse. You will follow up with a nurse in Dr. Arlyn Leak office on Monday for foley catheter removal. If you do not hear from [place] regarding your appointment by Friday at 3 pm please call the clinic to confirm your appointment details. You have been provided a medication - Oxybutynin - for bladder spasms caused by the catheter. This can cause dry mouth and constipation.

## 2016-04-21 NOTE — Progress Notes (Signed)
1 Day Post-Op   Subjective: The patient is doing well.  Denies nausea/vomiting. Pain is adequately controlled. SBP improved to low 90s after 500 cc bolus with improved UOP as well. Hgb dropped from 12.5 pre-op to 11.7 post-op to 9.7 this AM. Likely dilutional.   Objective: Vital signs in last 24 hours: Temp:  [97.5 F (36.4 C)-98.2 F (36.8 C)] 98 F (36.7 C) (06/20 0448) Pulse Rate:  [60-93] 77 (06/20 0448) Resp:  [14-21] 14 (06/20 0448) BP: (87-117)/(43-79) 92/61 mmHg (06/20 0448) SpO2:  [94 %-98 %] 94 % (06/20 0448) Weight:  [65.318 kg (144 lb)] 65.318 kg (144 lb) (06/19 0719)  Intake/Output from previous day: 06/19 0701 - 06/20 0700 In: 3930.4 [P.O.:240; I.V.:3340.4; IV Piggyback:350] Out: [id number] [Urine:1275; Blood:100] Intake/Output this shift: Total I/O In: 2380.4 [P.O.:240; I.V.:1790.4; IV Piggyback:350] Out: 625 [Urine:625]  Physical Exam:  General: Alert and oriented. CV: RRR Lungs: Clear bilaterally. GI: Soft, Nondistended. GU: Vaginal packing in removed with healthy appearing vaginal mucosa. Foley out.  Extremities: Nontender, no erythema, no edema.  Lab Results:  Recent Labs  04/20/16 1303 04/21/16 0524  HGB 11.7* 9.7*  HCT 35.4* 29.6*      Assessment/Plan: POD#1 s/p cystocele repair with dermis graft + [place] sling, recovering well  1) Medlock 2) Hold atenolol for hypotension 3) Monitor BP 4) Regular diet 5) Wean O2 6) Ambulate, Incentive spirometry 7) Transition to PO pain medication 8) Vaginal packing & foley removed at 0600 today 9) Follow up TOV with bladder scan to confirm complete emptying x 2 10) Plan for likely discharge later this AM after passes TOV    Acie Fredrickson 04/21/2016, 6:52 AM

## 2016-04-28 ENCOUNTER — Encounter (HOSPITAL_COMMUNITY): Payer: Self-pay | Admitting: Emergency Medicine

## 2016-04-28 ENCOUNTER — Emergency Department (HOSPITAL_COMMUNITY)
Admission: EM | Admit: 2016-04-28 | Discharge: 2016-04-28 | Disposition: A | Discharge status: Home / Self Care | Payer: PPO | Attending: Emergency Medicine | Admitting: Emergency Medicine

## 2016-04-28 DIAGNOSIS — N390 Urinary tract infection, site not specified: Secondary | ICD-10-CM | POA: Insufficient documentation

## 2016-04-28 DIAGNOSIS — Z79899 Other long term (current) drug therapy: Secondary | ICD-10-CM | POA: Insufficient documentation

## 2016-04-28 DIAGNOSIS — Z853 Personal history of malignant neoplasm of breast: Secondary | ICD-10-CM | POA: Diagnosis not present

## 2016-04-28 DIAGNOSIS — I10 Essential (primary) hypertension: Secondary | ICD-10-CM | POA: Insufficient documentation

## 2016-04-28 DIAGNOSIS — M1990 Unspecified osteoarthritis, unspecified site: Secondary | ICD-10-CM | POA: Insufficient documentation

## 2016-04-28 DIAGNOSIS — R300 Dysuria: Secondary | ICD-10-CM | POA: Diagnosis not present

## 2016-04-28 DIAGNOSIS — R339 Retention of urine, unspecified: Secondary | ICD-10-CM | POA: Insufficient documentation

## 2016-04-28 LAB — URINALYSIS, ROUTINE W REFLEX MICROSCOPIC
Bilirubin Urine: NEGATIVE
Glucose, UA: NEGATIVE mg/dL
Ketones, ur: NEGATIVE mg/dL
Nitrite: POSITIVE — AB
Protein, ur: 30 mg/dL — AB
Specific Gravity, Urine: 1.011 (ref 1.005–1.030)
pH: 6 (ref 5.0–8.0)

## 2016-04-28 LAB — URINE MICROSCOPIC-ADD ON

## 2016-04-28 MED ORDER — CEPHALEXIN 500 MG PO CAPS
500.0000 mg | ORAL_CAPSULE | Freq: Once | ORAL | Status: AC
  Administered 2016-04-28: 500 mg via ORAL
  Filled 2016-04-28: qty 1

## 2016-04-28 MED ORDER — CEPHALEXIN 250 MG PO CAPS: 250.0000 mg | ORAL_CAPSULE | Freq: Four times a day (QID) | ORAL | Status: DC

## 2016-04-28 NOTE — Discharge Instructions (Signed)
Acute Urinary Retention, Female °Acute urinary retention is the temporary inability to urinate. This is an uncommon problem in women. It can be caused by: °· Infection. °· A side effect of a medicine. °· A problem in a nearby organ that presses or squeezes on the bladder or the urethra (the tube that drains the bladder). °· Psychological problems. °·  Surgery on your bladder, urethra, or pelvic organs that causes obstruction to the outflow of urine from your bladder. °HOME CARE INSTRUCTIONS  °If you are sent home with a Foley catheter and a drainage system, you will need to discuss the best course of action with your health care provider. While the catheter is in, maintain a good intake of fluids. Keep the drainage bag emptied and lower than your catheter. This is so that contaminated urine will not flow back into your bladder, which could lead to a urinary tract infection. °There are two main types of drainage bags. One is a large bag that usually is used at night. It has a good capacity that will allow you to sleep through the night without having to empty it. The second type is called a leg bag. It has a smaller capacity so it needs to be emptied more frequently. However, the main advantage is that it can be attached by a leg strap and goes underneath your clothing, allowing you the freedom to move about or leave your home. °Only take over-the-counter or prescription medicines for pain, discomfort, or fever as directed by your health care provider.  °SEEK MEDICAL CARE IF: °· You develop a low-grade fever. °· You experience spasms or leakage of urine with the spasms. °SEEK IMMEDIATE MEDICAL CARE IF:  °· You develop chills or fever. °· Your catheter stops draining urine. °· Your catheter falls out. °· You start to develop increased bleeding that does not respond to rest and increased fluid intake. °MAKE SURE YOU: °· Understand these instructions. °· Will watch your condition. °· Will get help right away if you are  not doing well or get worse. °  °This information is not intended to replace advice given to you by your health care provider. Make sure you discuss any questions you have with your health care provider. °  °Document Released: 10/18/2006 Document Revised: 03/05/2015 Document Reviewed: 03/30/2013 °Elsevier Interactive Patient Education ©2016 [employer] ° °

## 2016-04-28 NOTE — ED Provider Notes (Addendum)
CSN: [id number]     Arrival date & time 04/28/16  1947 History   First MD Initiated Contact with Patient 04/28/16 2000     Chief Complaint  Patient presents with  . Dysuria     (Consider location/radiation/quality/duration/timing/severity/associated sxs/prior Treatment) Patient is a 82 y.o. female presenting with dysuria. The history is provided by the patient.  Dysuria Pain quality:  Sharp and shooting Pain severity:  Moderate Onset quality:  Gradual Duration:  12 hours Timing:  Constant Progression:  Worsening Chronicity:  New Recent urinary tract infections: no   Relieved by:  Nothing Worsened by:  Nothing tried Ineffective treatments:  None tried Urinary symptoms: frequent urination and hematuria   Associated symptoms: abdominal pain   Associated symptoms: no fever and no flank pain   Risk factors comment:  Recent surgery on her bladder and urethra for a sling.  catheter removed today   Past Medical History  Diagnosis Date  . Hypercholesterolemia   . Esophageal reflux   . Depression   . Anxiety   . Chronic UTI   . Breast cancer ([facility]) 2006    right breast  . Esophageal stricture   . Personal history of colonic polyps 02/09/2007    TUBULAR ADENOMA  . Family history of malignant neoplasm of gastrointestinal tract   . PONV (postoperative nausea and vomiting)   . Chronic cough   . Rotator cuff tear     RT  . Osteopenia   . Arthritis   . Back pain, chronic     WITH MOTION  . Hearing loss   . Status post dilation of esophageal narrowing   . GERD (gastroesophageal reflux disease)   . Urinary tract infection   . Umbilical hernia   . Hypertension   . Dysrhythmia   . History of hiatal hernia    Past Surgical History  Procedure Laterality Date  . Breast lumpectomy      right  . Umbilical hernia repair    . Dilation and curettage of uterus    . Colonoscopy    . Shoulder open rotator cuff repair Right 02/28/2013    Procedure: RIGHT ROTATOR CUFF REPAIR SHOULDER  OPEN WITH GRAFT AND ANCHORS;  Surgeon: Tobi Bastos, MD;  Location: [facility];  Service: Orthopedics;  Laterality: Right;  . Cataract extraction w/ intraocular lens implant Left   . Esophagogastroduodenoscopy endoscopy    . Cystocele repair N/A 04/20/2016    Procedure: AUGMENTED ANTERIOR VAULT REPAIR, COLOPLAST DERMIS GRAFT KELLY PLICATION SACROSPINOUS FIXATION;  Surgeon: Carolan Clines, MD;  Location: [facility];  Service: Urology;  Laterality: N/A;  . Pubovaginal sling N/A 04/20/2016    Procedure: PUBO-VAGINAL [name];  Surgeon: Carolan Clines, MD;  Location: [facility];  Service: Urology;  Laterality: N/A;   Family History  Problem Relation Age of Onset  . Colon cancer Mother 54  . Kidney cancer Mother   . Heart disease Sister   . Stroke Sister   . Stomach cancer Brother   . Lung cancer Brother   . Clotting disorder Son   . Emphysema Father     smoker  . Hypertension Sister   . Glaucoma Mother    Social History  Substance Use Topics  . Smoking status: Never Smoker   . Smokeless tobacco: Never Used  . Alcohol Use: No   OB History    Gravida Para Term Preterm AB TAB SAB Ectopic Multiple Living   1 1        1  Review of Systems  Constitutional: Negative for fever.  Gastrointestinal: Positive for abdominal pain.  Genitourinary: Positive for dysuria. Negative for flank pain.  All other systems reviewed and are negative.     Allergies  Ciprofloxacin; Clindamycin/lincomycin; Codeine; Diclofenac; Fenoprofen calcium; Flexeril; Hydrocodone; Macrobid; Noroxin; Relafen; and Sulfa antibiotics  Home Medications   Prior to Admission medications   Medication Sig Start Date End Date Taking? Authorizing Provider  acetaminophen (TYLENOL) 325 MG tablet Take 650 mg by mouth every 6 (six) hours as needed for moderate pain.   Yes Historical Provider, MD  atenolol (TENORMIN) 25 MG tablet TAKE ONE TABLET DAILY TO PREVENT PALPITATIONS. 04/01/16  Yes Historical Provider, MD   gabapentin (NEURONTIN) 100 MG capsule Take 100 mg by mouth 2 (two) times daily as needed (For pain.).  10/24/14  Yes Historical Provider, MD  hydrochlorothiazide (MICROZIDE) 12.5 MG capsule Take 12.5 mg by mouth daily. 05/04/15  Yes Historical Provider, MD  meclizine (ANTIVERT) 12.5 MG tablet Take 12.5 mg by mouth 2 (two) times daily.   Yes Historical Provider, MD  omeprazole (PRILOSEC) 20 MG capsule Take 20 mg by mouth daily as needed (For heartburn or acid reflux.).    Yes Historical Provider, MD  perphenazine-amitriptyline (ETRAFON/TRIAVIL) 2-10 MG TABS Take 1 tablet by mouth 2 (two) times daily.   Yes Historical Provider, MD  phenazopyridine (PYRIDIUM) 200 MG tablet Take 200 mg by mouth 3 (three) times daily as needed for pain.  02/07/16  Yes Historical Provider, MD  polyethylene glycol powder (GLYCOLAX/MIRALAX) powder Take 17 g by mouth daily.   Yes Historical Provider, MD  pravastatin (PRAVACHOL) 20 MG tablet Take 20 mg by mouth at bedtime.    Yes Historical Provider, MD  Calcium Carb-Cholecalciferol (CALCIUM 600 + D PO) Take 1 tablet by mouth daily.    Historical Provider, MD  cephALEXin (KEFLEX) 500 MG capsule Take 1 capsule (500 mg total) by mouth 3 (three) times daily. Patient not taking: Reported on 04/28/2016 04/21/16   Acie Fredrickson, MD  docusate sodium (COLACE) 100 MG capsule Take 1 capsule (100 mg total) by mouth 2 (two) times daily. Hold for diarrhea. Patient not taking: Reported on 04/28/2016 04/21/16   Acie Fredrickson, MD  HYDROmorphone (DILAUDID) 2 MG tablet Take 0.5 tablets (1 mg total) by mouth every 4 (four) hours as needed for moderate pain or severe pain. Patient not taking: Reported on 04/28/2016 04/21/16   Acie Fredrickson, MD  Multiple Vitamin (MULTIVITAMIN WITH MINERALS) TABS tablet Take 1 tablet by mouth daily.    Historical Provider, MD   BP 121/84 mmHg  Pulse 79  Temp(Src) 97.9 F (36.6 C) (Oral)  Resp 16  SpO2 93%  LMP 11/03/1983 Physical Exam  Constitutional: She  is oriented to person, place, and time. She appears well-developed and well-nourished.  Appears uncomfortable  HENT:  Head: Normocephalic and atraumatic.  Mouth/Throat: Oropharynx is clear and moist.  Eyes: Conjunctivae and EOM are normal. Pupils are equal, round, and reactive to light.  Neck: Normal range of motion. Neck supple.  Cardiovascular: Regular rhythm and intact distal pulses.  Tachycardia present.   No murmur heard. Pulmonary/Chest: Effort normal and breath sounds normal. No respiratory distress. She has no wheezes. She has no rales.  Abdominal: Soft. She exhibits no distension. There is tenderness. There is no rebound and no guarding.  Minimal suprapubic tenderness  Musculoskeletal: Normal range of motion. She exhibits no edema or tenderness.  Neurological: She is alert and oriented to person, place, and time.  Skin: Skin is warm and dry. No rash noted. No erythema.  Psychiatric: She has a normal mood and affect. Her behavior is normal.  Nursing note and vitals reviewed.   ED Course  Procedures (including critical care time) Labs Review Labs Reviewed  URINALYSIS, ROUTINE W REFLEX MICROSCOPIC (NOT AT [facility]) - Abnormal; Notable for the following:    Color, Urine ORANGE (*)    APPearance TURBID (*)    Hgb urine dipstick MODERATE (*)    Protein, ur 30 (*)    Nitrite POSITIVE (*)    Leukocytes, UA LARGE (*)    All other components within normal limits  URINE MICROSCOPIC-ADD ON - Abnormal; Notable for the following:    Squamous Epithelial / LPF 0-5 (*)    Bacteria, UA MANY (*)    All other components within normal limits  URINE CULTURE    Imaging Review No results found. I have personally reviewed and evaluated these images and lab results as part of my medical decision-making.   EKG Interpretation None      MDM   Final diagnoses:  UTI (lower urinary tract infection)  Urinary retention    She is an 82 year old female presenting today for persistent  suprapubic and urethral pain who had her catheter removed today after having bladder surgery and has been unable to completely urinate. Patient on bladder scan has greater than 500 ML's in her bladder. A catheter was placed and she had 700 out. UA also appears infected with positive nitrites and leukocytes. Patient has multiple antibiotic allergies including Cipro and sulfa. She did take 3 days of Keflex last week but has not been taking it persistently. Will start Keflex until cultures return. She will call Dr. Arlyn Leak office tomorrow and symptoms resolved once catheter was placed. She otherwise is well-appearing.    Blanchie Dessert, MD 04/28/16 [id number]  Blanchie Dessert, MD 04/28/16 2250

## 2016-04-28 NOTE — ED Notes (Signed)
Pt states that she had a catheter removed this morning following having bladder surgery at Dr. Arlyn Leak office. States that since that time she has had dysuria and polyuria. Alert and oriented.

## 2016-04-28 NOTE — ED Notes (Signed)
Pt ambulated to restroom with her cane and standby assist. No distress noted

## 2016-05-01 LAB — URINE CULTURE: Culture: >=100000 — AB

## 2016-05-02 ENCOUNTER — Telehealth (HOSPITAL_BASED_OUTPATIENT_CLINIC_OR_DEPARTMENT_OTHER): Payer: Self-pay

## 2016-05-02 ENCOUNTER — Emergency Department (HOSPITAL_COMMUNITY)
Admission: EM | Admit: 2016-05-02 | Discharge: 2016-05-02 | Disposition: A | Discharge status: Home / Self Care | Payer: PPO | Attending: Emergency Medicine | Admitting: Emergency Medicine

## 2016-05-02 ENCOUNTER — Encounter (HOSPITAL_COMMUNITY): Payer: Self-pay | Admitting: Emergency Medicine

## 2016-05-02 DIAGNOSIS — I10 Essential (primary) hypertension: Secondary | ICD-10-CM | POA: Insufficient documentation

## 2016-05-02 DIAGNOSIS — M1990 Unspecified osteoarthritis, unspecified site: Secondary | ICD-10-CM | POA: Insufficient documentation

## 2016-05-02 DIAGNOSIS — Z853 Personal history of malignant neoplasm of breast: Secondary | ICD-10-CM | POA: Diagnosis not present

## 2016-05-02 DIAGNOSIS — Z791 Long term (current) use of non-steroidal anti-inflammatories (NSAID): Secondary | ICD-10-CM | POA: Diagnosis not present

## 2016-05-02 DIAGNOSIS — F329 Major depressive disorder, single episode, unspecified: Secondary | ICD-10-CM | POA: Insufficient documentation

## 2016-05-02 DIAGNOSIS — N390 Urinary tract infection, site not specified: Secondary | ICD-10-CM | POA: Insufficient documentation

## 2016-05-02 DIAGNOSIS — Z79899 Other long term (current) drug therapy: Secondary | ICD-10-CM | POA: Diagnosis not present

## 2016-05-02 DIAGNOSIS — R109 Unspecified abdominal pain: Secondary | ICD-10-CM | POA: Diagnosis not present

## 2016-05-02 MED ORDER — FOSFOMYCIN TROMETHAMINE 3 G PO PACK: 3.0000 g | PACK | Freq: Once | ORAL | Status: DC

## 2016-05-02 MED ORDER — FOSFOMYCIN TROMETHAMINE 3 G PO PACK
3.0000 g | PACK | Freq: Once | ORAL | Status: AC
  Administered 2016-05-02: 3 g via ORAL
  Filled 2016-05-02: qty 3

## 2016-05-02 NOTE — Telephone Encounter (Signed)
Spoke with Patient for the need to change Abx treatment for + urine culture. To stop Cephalexin and start Fosfomycin.  Per Pharmacy Milus Glazier Pharm D, that due to Patients allergies, if insurance will not pay for medication then the Patient will need to come back to ED for IV Abx treatment. Her insurance will not cover the Fosfomycin. Explained all this to Patient and she sill come back to ED today.

## 2016-05-02 NOTE — Discharge Instructions (Signed)
Keep your appointment with your urologist to be seen on Monday, July 3. Follow-up with your primary care provider also on Monday to be reevaluated. Take the fosfomycin as prescribed, 3 g on Tuesday July 4th and 3 g on Friday 7th. If you experience diarrhea while taking this medication, stop taking it and speak to your primary care provider.   Return to the emergency department if you experience fever, chills, back pain, nausea or vomiting.   Antibiotic Medicine Antibiotic medicines are used to treat infections caused by bacteria. They work by injuring or killing the bacteria that is making you sick. HOW IS AN ANTIBIOTIC CHOSEN? An antibiotic is chosen based on many factors. To help your health care provider choose one for you, tell your health care provider if:  You have any allergies.  You are pregnant or plan to get pregnant.  You are breastfeeding.  You are taking any medicines. These include over-the-counter medicines, prescription medicines, and herbal remedies.  You have a medical condition or problem you have not already discussed. Your health care provider will also consider:  How often the medicine has to be taken.  Common side effects of the medicine.  The cost of the medicine.  The taste of the medicine. If you have questions about why an antibiotic was chosen, make sure to ask. FOR HOW LONG SHOULD I TAKE MY ANTIBIOTIC? Continue to take your antibiotic for as long as told by your health care provider. Do not stop taking it when you feel better. If you stop taking it too soon:  You may start to feel sick again.  Your infection may become harder to treat.  Complications may develop. WHAT IF I MISS A DOSE? Try not to miss any doses of medicine. If you miss a dose, take it as soon as possible. However, if it is almost time for the next dose:  If you are taking 2 doses per day, take the missed dose and the next dose 5 to 6 hours apart.  If you are taking 3 or more doses  per day, take the missed dose and the next dose 2 to 4 hours apart, then go back to the normal schedule. If you cannot make up a missed dose, take the next scheduled dose on time. Then take the missed dose after you have taken all the doses as recommended by your health care provider, as if you had one more dose left. DO ANTIBIOTICS AFFECT BIRTH CONTROL? Birth control pills may not work while you are on antibiotics. If you are taking birth control pills, continue taking them as usual and use a second form of birth control, such as a condom, to avoid unwanted pregnancy. Continue using the second form of birth control until you are finished with your current 1 month cycle of birth control pills. OTHER INFORMATION  If there is any medicine left over, throw it away.  Never take someone else's antibiotics.  Never take leftover antibiotics. SEEK MEDICAL CARE IF:  You get worse.  You do not feel better within a few days of starting the antibiotic medicine.  You vomit.  White patches appear in your mouth.  You have new joint pain that begins after starting the antibiotic.  You have new muscle aches that begin after starting the antibiotic.  You had a fever before starting the antibiotic and it returns.  You have any symptoms of an allergic reaction, such as an itchy rash. If this happens, stop taking the antibiotic. SEEK IMMEDIATE  MEDICAL CARE IF:  Your urine turns dark or becomes blood-colored.  Your skin turns yellow.  You bruise or bleed easily.  You have severe diarrhea and abdominal cramps.  You have a severe headache.  You have signs of a severe allergic reaction, such as:  Trouble breathing.  Wheezing.  Swelling of the lips, tongue, or face.  Fainting.  Blisters on the skin or in the mouth. If you have signs of a severe allergic reaction, stop taking the antibiotic right away.   This information is not intended to replace advice given to you by your health care  provider. Make sure you discuss any questions you have with your health care provider.   Document Released: 07/01/2004 Document Revised: 07/10/2015 Document Reviewed: 03/06/2015 Elsevier Interactive Patient Education [employer].

## 2016-05-02 NOTE — ED Provider Notes (Signed)
CSN: [id number]     Arrival date & time 05/02/16  1500 History   First MD Initiated Contact with Patient 05/02/16 1514     Chief Complaint  Patient presents with  . IV Antibiotics      (Consider location/radiation/quality/duration/timing/severity/associated sxs/prior Treatment) HPI   Pt is a 82 y/o female with a hx of chronic UTI and s/p Anterior vault sacrospinous fixation using dermal graft repair, and Altis midurethral sling on 6/20 who presents to the ED for fosfomycin. Pt had catheter removed on 6/27 at her urologist office and presented to the ED that night with the inability to urinate. At that time a urine culture was performed and a catheter placed. Pt was d/c on Keflex. Culture revealed organism not susceptible to Keflex and the pt needs fosfomycin. Pharmacy unable to get Antibiotic until Monday and they instructed the patient to come to the ED for it. Pt endorses mild suprapubic cramping, 1/10, non-radiating. Patient denies any flank pain, fever, chills, nausea, vomiting, changes in bowels, blood in her stool.  Past Medical History  Diagnosis Date  . Hypercholesterolemia   . Esophageal reflux   . Depression   . Anxiety   . Chronic UTI   . Breast cancer ([place]) 2006    right breast  . Esophageal stricture   . Personal history of colonic polyps 02/09/2007    TUBULAR ADENOMA  . Family history of malignant neoplasm of gastrointestinal tract   . PONV (postoperative nausea and vomiting)   . Chronic cough   . Rotator cuff tear     RT  . Osteopenia   . Arthritis   . Back pain, chronic     WITH MOTION  . Hearing loss   . Status post dilation of esophageal narrowing   . GERD (gastroesophageal reflux disease)   . Urinary tract infection   . Umbilical hernia   . Hypertension   . Dysrhythmia   . History of hiatal hernia    Past Surgical History  Procedure Laterality Date  . Breast lumpectomy      right  . Umbilical hernia repair    . Dilation and curettage of uterus    .  Colonoscopy    . Shoulder open rotator cuff repair Right 02/28/2013    Procedure: RIGHT ROTATOR CUFF REPAIR SHOULDER OPEN WITH GRAFT AND ANCHORS;  Surgeon: Tobi Bastos, MD;  Location: [facility];  Service: Orthopedics;  Laterality: Right;  . Cataract extraction w/ intraocular lens implant Left   . Esophagogastroduodenoscopy endoscopy    . Cystocele repair N/A 04/20/2016    Procedure: AUGMENTED ANTERIOR VAULT REPAIR, COLOPLAST DERMIS GRAFT KELLY PLICATION SACROSPINOUS FIXATION;  Surgeon: Carolan Clines, MD;  Location: [facility];  Service: Urology;  Laterality: N/A;  . Pubovaginal sling N/A 04/20/2016    Procedure: PUBO-VAGINAL [name];  Surgeon: Carolan Clines, MD;  Location: [facility];  Service: Urology;  Laterality: N/A;   Family History  Problem Relation Age of Onset  . Colon cancer Mother 76  . Kidney cancer Mother   . Heart disease Sister   . Stroke Sister   . Stomach cancer Brother   . Lung cancer Brother   . Clotting disorder Son   . Emphysema Father     smoker  . Hypertension Sister   . Glaucoma Mother    Social History  Substance Use Topics  . Smoking status: Never Smoker   . Smokeless tobacco: Never Used  . Alcohol Use: No   OB History    [place]  Para Term Preterm AB TAB SAB Ectopic Multiple Living   1 1        1      Review of Systems  Constitutional: Negative for fever and chills.  Respiratory: Negative for shortness of breath.   Cardiovascular: Negative for chest pain.  Gastrointestinal: Positive for abdominal pain (cramping). Negative for nausea, vomiting, diarrhea and blood in stool.  Skin: Negative for rash.  Neurological: Negative for dizziness, speech difficulty, weakness and headaches.      Allergies  Ciprofloxacin; Clindamycin/lincomycin; Codeine; Diclofenac; Fenoprofen calcium; Flexeril; Hydrocodone; Macrobid; Noroxin; Relafen; and Sulfa antibiotics  Home Medications   Prior to Admission medications   Medication Sig Start Date End Date  Taking? Authorizing Provider  acetaminophen (TYLENOL) 325 MG tablet Take 650 mg by mouth every 6 (six) hours as needed for moderate pain.   Yes Historical Provider, MD  atenolol (TENORMIN) 25 MG tablet TAKE ONE TABLET DAILY TO PREVENT PALPITATIONS. 04/01/16  Yes Historical Provider, MD  Calcium Carb-Cholecalciferol (CALCIUM 600 + D PO) Take 1 tablet by mouth daily.   Yes Historical Provider, MD  gabapentin (NEURONTIN) 100 MG capsule Take 100 mg by mouth 2 (two) times daily as needed (For pain.).  10/24/14  Yes Historical Provider, MD  meclizine (ANTIVERT) 12.5 MG tablet Take 12.5 mg by mouth 2 (two) times daily.   Yes Historical Provider, MD  Multiple Vitamin (MULTIVITAMIN WITH MINERALS) TABS tablet Take 1 tablet by mouth daily.   Yes Historical Provider, MD  omeprazole (PRILOSEC) 20 MG capsule Take 20 mg by mouth daily as needed (For heartburn or acid reflux.).    Yes Historical Provider, MD  perphenazine-amitriptyline (ETRAFON/TRIAVIL) 2-10 MG TABS Take 1 tablet by mouth 2 (two) times daily.   Yes Historical Provider, MD  phenazopyridine (PYRIDIUM) 200 MG tablet Take 200 mg by mouth at bedtime.  02/07/16  Yes Historical Provider, MD  polyethylene glycol powder (GLYCOLAX/MIRALAX) powder Take 17 g by mouth daily.   Yes Historical Provider, MD  pravastatin (PRAVACHOL) 20 MG tablet Take 20 mg by mouth at bedtime.    Yes Historical Provider, MD  tamsulosin (FLOMAX) 0.4 MG CAPS capsule Take 0.4 mg by mouth at bedtime. 04/30/16  Yes Historical Provider, MD  cephALEXin (KEFLEX) 250 MG capsule Take 1 capsule (250 mg total) by mouth 4 (four) times daily. Patient not taking: Reported on 05/02/2016 04/28/16   Blanchie Dessert, MD  docusate sodium (COLACE) 100 MG capsule Take 1 capsule (100 mg total) by mouth 2 (two) times daily. Hold for diarrhea. Patient not taking: Reported on 04/28/2016 04/21/16   Acie Fredrickson, MD  fosfomycin (MONUROL) 3 g PACK Take 3 g by mouth once. Take 3g on Tuesday and 3g on Friday 05/02/16    Kalman Drape, PA  hydrochlorothiazide (MICROZIDE) 12.5 MG capsule Take 12.5 mg by mouth daily. 05/04/15   Historical Provider, MD  HYDROmorphone (DILAUDID) 2 MG tablet Take 0.5 tablets (1 mg total) by mouth every 4 (four) hours as needed for moderate pain or severe pain. Patient not taking: Reported on 04/28/2016 04/21/16   Acie Fredrickson, MD   BP 116/78 mmHg  Pulse 82  Temp(Src) 97.7 F (36.5 C) (Oral)  Resp 16  SpO2 96%  LMP 11/03/1983 Physical Exam  Constitutional: She appears well-developed and well-nourished. No distress.  HENT:  Head: Normocephalic and atraumatic.  Eyes: Conjunctivae are normal.  Cardiovascular: Normal rate and normal heart sounds.  An irregular rhythm present. Exam reveals no gallop and no friction rub.   No  murmur heard. Pulmonary/Chest: Effort normal.  Abdominal: Soft. Bowel sounds are normal. She exhibits no distension. There is no tenderness. There is no rebound, no guarding and no CVA tenderness.  Neurological: She is alert. Coordination normal.  Skin: Skin is warm and dry.  Psychiatric: She has a normal mood and affect. Her behavior is normal.  Nursing note and vitals reviewed.   ED Course  Procedures (including critical care time) Labs Review Labs Reviewed - No data to display  Imaging Review No results found. I have personally reviewed and evaluated these images and lab results as part of my medical decision-making.   EKG Interpretation None      MDM   Final diagnoses:  UTI (lower urinary tract infection)   Pt has been diagnosed with a UTI. Pt is afebrile, no CVA tenderness, normotensive, and denies N/V. Pt given one dose of fosfomycin here in the ED and given a prescription for 2 more doses to be taken q3 days per pharmacy. She has appointment with her urologist on Monday which she will see regarding her catheter. I also instructed her to follow-up with her primary care provider within 2 days. I discussed side effects of fosfomycin to  include diarrhea or rash. Instructed her to discontinue if she develops either an contact her primary care provider. I discussed strict return precautions to include signs of pyonephritis. She expressed understanding to the discharge instructions.  Case discussed and pt seen by Dr. Tomi Bamberger who agrees with the above plan.      Kalman Drape, [place] 05/02/16 [place], MD 05/02/16 [phone]

## 2016-05-02 NOTE — ED Provider Notes (Signed)
Medical screening examination/treatment/procedure(s) were conducted as a shared visit with non-physician practitioner(s) and myself.  I personally evaluated the patient during the encounter.  Pt sent to the ED to get a dose of fosfomycin for a UTI.   Pt has a cipro and cephalosporin allergy.  The rx was called in but her pharmacy did not have the drug available.  Pt otherwise has no complaints.  Will give dose of med and she has an rx for the other doses.  Specimen Description URINE, CATHETERIZED   Special Requests NONE   Culture >=100,000 COLONIES/mL SERRATIA MARCESCENS (A)   Report Status 05/01/2016 FINAL   Organism ID, Bacteria SERRATIA MARCESCENS (A)   Resulting Agency [facility]    Culture & Susceptibility      SERRATIA MARCESCENS     Antibiotic Sensitivity Microscan Status    CEFAZOLIN Resistant >=64 RESISTANT Final    Method: MIC    CEFTRIAXONE Sensitive <=1 SENSITIVE Final    Method: MIC    CIPROFLOXACIN Sensitive <=0.25 SENSITIVE Final    Method: MIC    GENTAMICIN Sensitive <=1 SENSITIVE Final    Method: MIC    NITROFURANTOIN Resistant 256 RESISTANT Final    Method: MIC    TRIMETH/SULFA Sensitive <=20 SENSITIVE Final    Method: MIC            Dorie Rank, MD 05/02/16 [phone]

## 2016-05-02 NOTE — Progress Notes (Signed)
ED Antimicrobial Stewardship Positive Culture Follow Up   Natalie West is an 82 y.o. female who presented to [facility] on 04/28/2016 with a chief complaint of  Chief Complaint  Patient presents with  . Dysuria    Recent Results (from the past 720 hour(s))  Urine culture     Status: Abnormal   Collection Time: 04/28/16  9:00 PM  Result Value Ref Range Status   Specimen Description URINE, CATHETERIZED  Final   Special Requests NONE  Final   Culture >=100,000 COLONIES/mL SERRATIA MARCESCENS (A)  Final   Report Status 05/01/2016 FINAL  Final   Organism ID, Bacteria SERRATIA MARCESCENS (A)  Final      Susceptibility   Serratia marcescens - MIC*    CEFAZOLIN >=64 RESISTANT Resistant     CEFTRIAXONE <=1 SENSITIVE Sensitive     CIPROFLOXACIN <=0.25 SENSITIVE Sensitive     GENTAMICIN <=1 SENSITIVE Sensitive     NITROFURANTOIN 256 RESISTANT Resistant     TRIMETH/SULFA <=20 SENSITIVE Sensitive     * >=100,000 COLONIES/mL SERRATIA MARCESCENS    [x]  Treated with cephalexin, organism resistant to prescribed antimicrobial  New antibiotic prescription: Stop cephalexin. Fosfomycin 3 gm PO q3 days x 3 doses  ED Provider: Arlean Hopping, PA-C  Margaretta Chittum L. Nicole Kindred, PharmD Infectious Diseases Clinical Pharmacist Pager: [phone] 05/02/2016 11:48 AM

## 2016-05-02 NOTE — ED Notes (Signed)
Pt was told to come to ED for IV antibiotics (Fosfomycin) since pharmacy unable to get Fosfomycin filled until later date. Pt called by Kathaleen Grinder admission management team.

## 2016-05-04 DIAGNOSIS — R338 Other retention of urine: Secondary | ICD-10-CM | POA: Diagnosis not present

## 2016-05-12 DIAGNOSIS — R3915 Urgency of urination: Secondary | ICD-10-CM | POA: Diagnosis not present

## 2016-05-12 DIAGNOSIS — I959 Hypotension, unspecified: Secondary | ICD-10-CM | POA: Diagnosis not present

## 2016-05-12 DIAGNOSIS — R278 Other lack of coordination: Secondary | ICD-10-CM | POA: Diagnosis not present

## 2016-05-12 DIAGNOSIS — M6281 Muscle weakness (generalized): Secondary | ICD-10-CM | POA: Diagnosis not present

## 2016-05-12 DIAGNOSIS — N3941 Urge incontinence: Secondary | ICD-10-CM | POA: Diagnosis not present

## 2016-05-12 DIAGNOSIS — N393 Stress incontinence (female) (male): Secondary | ICD-10-CM | POA: Diagnosis not present

## 2016-05-19 DIAGNOSIS — N8111 Cystocele, midline: Secondary | ICD-10-CM | POA: Diagnosis not present

## 2016-05-26 DIAGNOSIS — N3000 Acute cystitis without hematuria: Secondary | ICD-10-CM | POA: Diagnosis not present

## 2016-05-26 DIAGNOSIS — N390 Urinary tract infection, site not specified: Secondary | ICD-10-CM | POA: Diagnosis not present

## 2016-05-26 DIAGNOSIS — R3914 Feeling of incomplete bladder emptying: Secondary | ICD-10-CM | POA: Diagnosis not present

## 2016-05-28 ENCOUNTER — Other Ambulatory Visit: Payer: Self-pay | Admitting: Urology

## 2016-06-09 NOTE — Progress Notes (Signed)
Echo 11-15-14 epic

## 2016-06-09 NOTE — Patient Instructions (Addendum)
GEETHA HORNICK  06/09/2016   Your procedure is scheduled on: 06-12-16  Report to [facility] Main  Entrance take [facility]  elevators to 3rd floor to  [place] at 830  AM.  Call this number if you have problems the morning of surgery [phone]   Remember: ONLY 1 PERSON MAY GO WITH YOU TO SHORT STAY TO GET  READY MORNING OF [place].  Do not eat food or drink liquids :After Midnight.     Take these medicines the morning of surgery with A SIP OF WATER:  bethanechol (urecholine), omeprazole, hydromorphone if needed, tamsulosin, perphenazine-amitriptyline                               You may not have any metal on your body including hair pins and              piercings  Do not wear jewelry, make-up, lotions, powders or perfumes, deodorant             Do not wear nail polish.  Do not shave  48 hours prior to surgery.              Men may shave face and neck.   Do not bring valuables to the hospital. [place].  Contacts, dentures or bridgework may not be worn into surgery.  Leave suitcase in the car. After surgery it may be brought to your room.   Driver becky lowerder family member cell [phone]               Please read over the following fact sheets you were given: _____________________________________________________________________             [facility] - Preparing for Surgery Before surgery, you can play an important role.  Because skin is not sterile, your skin needs to be as free of germs as possible.  You can reduce the number of germs on your skin by washing with CHG (chlorahexidine gluconate) soap before surgery.  CHG is an antiseptic cleaner which kills germs and bonds with the skin to continue killing germs even after washing. Please DO NOT use if you have an allergy to CHG or antibacterial soaps.  If your skin becomes reddened/irritated stop using the CHG and inform your nurse when you  arrive at Short Stay. Do not shave (including legs and underarms) for at least 48 hours prior to the first CHG shower.  You may shave your face/neck. Please follow these instructions carefully:  1.  Shower with CHG Soap the night before surgery and the  morning of Surgery.  2.  If you choose to wash your hair, wash your hair first as usual with your  normal  shampoo.  3.  After you shampoo, rinse your hair and body thoroughly to remove the  shampoo.                           4.  Use CHG as you would any other liquid soap.  You can apply chg directly  to the skin and wash  Gently with a scrungie or clean washcloth.  5.  Apply the CHG Soap to your body ONLY FROM THE NECK DOWN.   Do not use on face/ open                           Wound or open sores. Avoid contact with eyes, ears mouth and genitals (private parts).                       Wash face,  Genitals (private parts) with your normal soap.             6.  Wash thoroughly, paying special attention to the area where your surgery  will be performed.  7.  Thoroughly rinse your body with warm water from the neck down.  8.  DO NOT shower/wash with your normal soap after using and rinsing off  the CHG Soap.                9.  Pat yourself dry with a clean towel.            10.  Wear clean pajamas.            11.  Place clean sheets on your bed the night of your first shower and do not  sleep with pets. Day of Surgery : Do not apply any lotions/deodorants the morning of surgery.  Please wear clean clothes to the hospital/surgery center.  FAILURE TO FOLLOW THESE INSTRUCTIONS MAY RESULT IN THE CANCELLATION OF YOUR SURGERY PATIENT SIGNATURE_________________________________  NURSE SIGNATURE__________________________________  ________________________________________________________________________

## 2016-06-10 ENCOUNTER — Encounter (HOSPITAL_COMMUNITY): Payer: Self-pay

## 2016-06-10 ENCOUNTER — Encounter (HOSPITAL_COMMUNITY)
Admission: RE | Admit: 2016-06-10 | Discharge: 2016-06-10 | Disposition: A | Discharge status: Home / Self Care | Payer: PPO | Source: Ambulatory Visit | Attending: Urology | Admitting: Urology

## 2016-06-10 DIAGNOSIS — R3914 Feeling of incomplete bladder emptying: Secondary | ICD-10-CM | POA: Diagnosis not present

## 2016-06-10 DIAGNOSIS — I10 Essential (primary) hypertension: Secondary | ICD-10-CM | POA: Diagnosis not present

## 2016-06-10 DIAGNOSIS — K219 Gastro-esophageal reflux disease without esophagitis: Secondary | ICD-10-CM | POA: Diagnosis not present

## 2016-06-10 DIAGNOSIS — I4891 Unspecified atrial fibrillation: Secondary | ICD-10-CM | POA: Diagnosis not present

## 2016-06-10 DIAGNOSIS — N3000 Acute cystitis without hematuria: Secondary | ICD-10-CM | POA: Diagnosis not present

## 2016-06-10 DIAGNOSIS — R339 Retention of urine, unspecified: Secondary | ICD-10-CM | POA: Diagnosis not present

## 2016-06-10 DIAGNOSIS — Z79899 Other long term (current) drug therapy: Secondary | ICD-10-CM | POA: Diagnosis not present

## 2016-06-10 HISTORY — DX: Unspecified hemorrhoids: K64.9

## 2016-06-10 HISTORY — DX: Anesthesia of skin: R20.0

## 2016-06-10 HISTORY — DX: Paresthesia of skin: R20.2

## 2016-06-10 LAB — BASIC METABOLIC PANEL
Anion gap: 6 (ref 5–15)
BUN: 22 mg/dL — ABNORMAL HIGH (ref 6–20)
CO2: 29 mmol/L (ref 22–32)
Calcium: 9.1 mg/dL (ref 8.9–10.3)
Chloride: 105 mmol/L (ref 101–111)
Creatinine, Ser: 0.89 mg/dL (ref 0.44–1.00)
GFR calc Af Amer: >60 mL/min (ref >60)
GFR calc non Af Amer: 59 mL/min — ABNORMAL LOW (ref >60)
Glucose, Bld: 92 mg/dL (ref 65–99)
Potassium: 3.8 mmol/L (ref 3.5–5.1)
Sodium: 140 mmol/L (ref 135–145)

## 2016-06-10 LAB — CBC
HCT: 37.1 % (ref 36.0–46.0)
Hemoglobin: 12 g/dL (ref 12.0–15.0)
MCH: 31.1 pg (ref 26.0–34.0)
MCHC: 32.3 g/dL (ref 30.0–36.0)
MCV: 96.1 fL (ref 78.0–100.0)
Platelets: 196 10*3/uL (ref 150–400)
RBC: 3.86 MIL/uL — ABNORMAL LOW (ref 3.87–5.11)
RDW: 13 % (ref 11.5–15.5)
WBC: 6.9 10*3/uL (ref 4.0–10.5)

## 2016-06-10 NOTE — Progress Notes (Signed)
ekg 03-06-16 dr Redmond Pulling on chart  chest xray 2 view 03-03-16 dr Redmond Pulling on chart

## 2016-06-11 NOTE — H&P (Signed)
Post-Operative Visit Report 05/26/2016    Natalie West. Natalie West         MRN: [id number]  PRIMARY CARE:  Jama Flavors. Redmond Pulling, MD  DOB: January 08, 1931, 82 year old Female  REFERRING:  Felipa Emory  [id number]  PROVIDER:  Carolan Clines, M.D.    LOCATION:  [facility] [phone]    CC: I am here for a post operative visit.  HPI: Natalie West is an 82 year-old female established patient who is here for a post operative visit.  The surgery she had done was anterior vault sacrospinous fixation using dermal graft repair, Altis midurethral sling, Coloplast Dermis Graft Kelly plication. Her surgery was done 04/20/2016.   She has not had incisional drainage. She has not had pain in her incision. She does not have swelling around her incision. She has not had redness and erythema at her incision site.   She has not had post operative nausea. She has not had vomiting. She has not had post operative fever. She has not had post operative chills. She does have a good appetite. BOWEL HABITS: her bowels are moving normally.     ALLERGIES: Cipro TABS Codeine Derivatives Diclofenac Sodium TBEC Flexeril TABS Hydrocodone Macrobid CAPS nitrofurantoin Noroxin TABS Relafen TABS Sulfa Drugs    MEDICATIONS: Tamsulosin Hcl 0.4 mg capsule, ext release 24 hr 1 capsule PO Daily  Amitriptyline HCl - 10 MG Oral Tablet Oral  Bethanechol Chloride 25 mg tablet 1 tablet PO BID  Calcium/Vitamin D/Minerals TABS Oral  Gabapentin 100 MG Oral Capsule Oral  Meclizine HCl - 12.5 MG Oral Tablet Oral  Multi-Vitamin TABS Oral  Omeprazole 20 MG Oral Tablet Delayed Release Oral  Phenazopyridine HCl - 200 MG Oral Tablet 0 Oral  Pravastatin Sodium 20 MG Oral Tablet Oral  Stool Softener TABS Oral     REVIEW OF SYSTEMS:     GU Review Female:  incomplete bladder emptying. Patient reports frequent urination, hard to postpone urination, get up at night to urinate, and stream starts and stops. Patient denies burning /pain  with urination, leakage of urine, trouble starting your stream, have to strain to urinate, and currently pregnant.    Gastrointestinal (Upper):  Patient denies nausea, vomiting, and indigestion/ heartburn.    Gastrointestinal (Lower):  Patient denies diarrhea and constipation.    Constitutional:  Patient denies fever, night sweats, weight loss, and fatigue.    Skin:  Patient denies itching and skin rash/ lesion.    Eyes:  Patient denies blurred vision and double vision.    Ears/ Nose/ Throat:  Patient denies sore throat and sinus problems.    Hematologic/Lymphatic:  Patient denies swollen glands and easy bruising.    Cardiovascular:  Patient denies leg swelling and chest pains.    Respiratory:  Patient reports cough and shortness of breath.     Endocrine:  Patient denies excessive thirst.    Musculoskeletal:  Patient denies back pain and joint pain.    Neurological:  Patient denies headaches and dizziness.    Psychologic:  Patient reports depression and anxiety.     PROCEDURES:             PVR Ultrasound - [id number]  Scanned Volume: 300 cc    Urinalysis w/Scope - 81001  Dipstick Dipstick Cont'd Micro  Specimen: Voided Bilirubin: Invalid WBC/hpf: >60/hpf  Color: Orange Ketones: Invalid RBC/hpf: 0-2/hpf  Appearance: Cloudy Blood: Invalid Bacteria: Many (>50/hpf)  Specific Gravity: Invalid Protein: Invalid Cystals: NS (Not Seen)  pH:  Invalid Urobilinogen: Invalid Casts: NS (Not Seen)  Glucose: Invalid Nitrites: Invalid Trichomonas: Not Present   Leukocyte Esterase: Invalid Mucous: Not Present    Epithelial Cells: NS (Not Seen)    Yeast: NS (Not Seen)    Sperm: Not Present    ASSESSMENT:     ICD-10 Details  2 GU:  Incomplete bladder emptying - R39.14   3  Acute Cystitis - N30.00   1 NON-GU:  Other specified postprocedural states - Z98.89    Notes:  Natalie West is an 82 year old female, status post anterior wall repair, as well as Altis urethral sling. she continues to have elevated  postvoid residuals, 300 cc, despite taking bethanechol. She is having bowel movements with urination [place] by history distances, and this may be how she gets recurrent urinary tract infections. She'll have urine culture today. She's been on bethanechol to try to increase her urination, but is not responding. I believe it is time to go ahead and incise her sling. This may lead to some urinary incontinence, but we will simply deal with that, rather than with chronic retention. This will allow [place] to reduce and stop her bethanechol.    PLAN:   Orders  Labs Urine Culture and Sensitivity  Schedule  Return Visit: Next Available Appointment - Schedule Surgery  Document  Letter(s):  Created for Patient: Clinical Summary   Notes:  1. Schedule surgery to incise sling   2. Continue bethanechol until sling is in size, then began to reduce bethanechol until she is off medication.   3 culture urine today and treat report accordingly presents his note allergy to Cipro, Macrobid, and sulfur)   Signed by Carolan Clines, M.D. on 05/26/16 at 2:30 PM (EDT)  The information contained in this medical record document is considered private and confidential patient information. This information can only be used for the medical diagnosis and/or medical services that are being provided by the patient's selected caregivers. This information can only be distributed outside of the patient's care if the patient agrees and signs waivers of authorization for this information to be sent to an outside source or route.

## 2016-06-12 ENCOUNTER — Encounter (HOSPITAL_COMMUNITY)
Admission: RE | Disposition: A | Discharge status: Home / Self Care | Payer: Self-pay | Source: Ambulatory Visit | Attending: Urology

## 2016-06-12 ENCOUNTER — Ambulatory Visit (HOSPITAL_COMMUNITY): Payer: PPO | Admitting: Anesthesiology

## 2016-06-12 ENCOUNTER — Ambulatory Visit (HOSPITAL_COMMUNITY)
Admission: RE | Admit: 2016-06-12 | Discharge: 2016-06-12 | Disposition: A | Discharge status: Home / Self Care | Payer: PPO | Source: Ambulatory Visit | Attending: Urology | Admitting: Urology

## 2016-06-12 ENCOUNTER — Encounter (HOSPITAL_COMMUNITY): Payer: Self-pay | Admitting: Anesthesiology

## 2016-06-12 DIAGNOSIS — N3000 Acute cystitis without hematuria: Secondary | ICD-10-CM | POA: Diagnosis not present

## 2016-06-12 DIAGNOSIS — R339 Retention of urine, unspecified: Secondary | ICD-10-CM | POA: Diagnosis not present

## 2016-06-12 DIAGNOSIS — Z79899 Other long term (current) drug therapy: Secondary | ICD-10-CM | POA: Insufficient documentation

## 2016-06-12 DIAGNOSIS — N393 Stress incontinence (female) (male): Secondary | ICD-10-CM | POA: Diagnosis not present

## 2016-06-12 DIAGNOSIS — IMO0002 Reserved for concepts with insufficient information to code with codable children: Secondary | ICD-10-CM

## 2016-06-12 DIAGNOSIS — K219 Gastro-esophageal reflux disease without esophagitis: Secondary | ICD-10-CM | POA: Insufficient documentation

## 2016-06-12 DIAGNOSIS — I4891 Unspecified atrial fibrillation: Secondary | ICD-10-CM | POA: Diagnosis not present

## 2016-06-12 DIAGNOSIS — R3914 Feeling of incomplete bladder emptying: Secondary | ICD-10-CM | POA: Diagnosis not present

## 2016-06-12 DIAGNOSIS — I10 Essential (primary) hypertension: Secondary | ICD-10-CM | POA: Insufficient documentation

## 2016-06-12 HISTORY — PX: REVISION URINARY SLING: SHX6213

## 2016-06-12 SURGERY — REVISION, SLING OPERATION, FOR STRESS INCONTINENCE
Anesthesia: General

## 2016-06-12 MED ORDER — FENTANYL CITRATE (PF) 100 MCG/2ML IJ SOLN
INTRAMUSCULAR | Status: AC
  Filled 2016-06-12: qty 2

## 2016-06-12 MED ORDER — PHENYLEPHRINE 40 MCG/ML (10ML) SYRINGE FOR IV PUSH (FOR BLOOD PRESSURE SUPPORT)
PREFILLED_SYRINGE | INTRAVENOUS | Status: AC
  Filled 2016-06-12: qty 10

## 2016-06-12 MED ORDER — CEFAZOLIN SODIUM-DEXTROSE 2-4 GM/100ML-% IV SOLN
2.0000 g | INTRAVENOUS | Status: AC
  Administered 2016-06-12: 2 g via INTRAVENOUS
  Filled 2016-06-12: qty 100

## 2016-06-12 MED ORDER — ONDANSETRON HCL 4 MG/2ML IJ SOLN
INTRAMUSCULAR | Status: AC
  Filled 2016-06-12: qty 2

## 2016-06-12 MED ORDER — EPHEDRINE SULFATE 50 MG/ML IJ SOLN
INTRAMUSCULAR | Status: DC | PRN
  Administered 2016-06-12: 10 mg via INTRAVENOUS

## 2016-06-12 MED ORDER — PHENYLEPHRINE HCL 10 MG/ML IJ SOLN
INTRAMUSCULAR | Status: DC | PRN
  Administered 2016-06-12: 40 ug via INTRAVENOUS
  Administered 2016-06-12: 80 ug via INTRAVENOUS
  Administered 2016-06-12: 40 ug via INTRAVENOUS
  Administered 2016-06-12: 80 ug via INTRAVENOUS

## 2016-06-12 MED ORDER — TRAMADOL HCL 50 MG PO TABS
50.0000 mg | ORAL_TABLET | Freq: Once | ORAL | Status: AC
  Administered 2016-06-12: 50 mg via ORAL
  Filled 2016-06-12: qty 1

## 2016-06-12 MED ORDER — ACETAMINOPHEN 500 MG PO TABS
1000.0000 mg | ORAL_TABLET | Freq: Once | ORAL | Status: AC
  Administered 2016-06-12: 1000 mg via ORAL
  Filled 2016-06-12: qty 2

## 2016-06-12 MED ORDER — TRAMADOL HCL 50 MG PO TABS: 100.0000 mg | ORAL_TABLET | Freq: Four times a day (QID) | ORAL | 0 refills | Status: DC | PRN

## 2016-06-12 MED ORDER — LACTATED RINGERS IV SOLN
INTRAVENOUS | Status: DC
  Administered 2016-06-12: 12:00:00 via INTRAVENOUS

## 2016-06-12 MED ORDER — LIDOCAINE HCL (CARDIAC) 20 MG/ML IV SOLN
INTRAVENOUS | Status: DC | PRN
  Administered 2016-06-12: 20 mg via INTRAVENOUS

## 2016-06-12 MED ORDER — FENTANYL CITRATE (PF) 100 MCG/2ML IJ SOLN: 25.0000 ug | INTRAMUSCULAR | Status: DC | PRN

## 2016-06-12 MED ORDER — FENTANYL CITRATE (PF) 100 MCG/2ML IJ SOLN
INTRAMUSCULAR | Status: DC | PRN
  Administered 2016-06-12: 50 ug via INTRAVENOUS

## 2016-06-12 MED ORDER — BUPIVACAINE-EPINEPHRINE (PF) 0.25% -1:200000 IJ SOLN
INTRAMUSCULAR | Status: AC
  Filled 2016-06-12: qty 30

## 2016-06-12 MED ORDER — LIDOCAINE HCL (CARDIAC) 20 MG/ML IV SOLN
INTRAVENOUS | Status: AC
Start: 2016-06-12 — End: 2016-06-12
  Filled 2016-06-12: qty 5

## 2016-06-12 MED ORDER — PROMETHAZINE HCL 25 MG/ML IJ SOLN: 6.2500 mg | INTRAMUSCULAR | Status: DC | PRN

## 2016-06-12 MED ORDER — ONDANSETRON HCL 4 MG/2ML IJ SOLN
INTRAMUSCULAR | Status: DC | PRN
  Administered 2016-06-12: 4 mg via INTRAVENOUS

## 2016-06-12 MED ORDER — CEFAZOLIN SODIUM-DEXTROSE 2-4 GM/100ML-% IV SOLN
INTRAVENOUS | Status: AC
  Filled 2016-06-12: qty 100

## 2016-06-12 MED ORDER — PROPOFOL 10 MG/ML IV BOLUS
INTRAVENOUS | Status: AC
  Filled 2016-06-12: qty 20

## 2016-06-12 MED ORDER — PROPOFOL 10 MG/ML IV BOLUS
INTRAVENOUS | Status: DC | PRN
  Administered 2016-06-12: 100 mg via INTRAVENOUS

## 2016-06-12 MED ORDER — BUPIVACAINE-EPINEPHRINE (PF) 0.25% -1:200000 IJ SOLN
INTRAMUSCULAR | Status: DC | PRN
  Administered 2016-06-12: 10 mL

## 2016-06-12 MED ORDER — LACTATED RINGERS IV SOLN
INTRAVENOUS | Status: DC | PRN
  Administered 2016-06-12: 10:00:00 via INTRAVENOUS

## 2016-06-12 SURGICAL SUPPLY — 38 items
BAG URINE DRAINAGE (UROLOGICAL SUPPLIES) IMPLANT
BLADE HEX COATED 2.75 (ELECTRODE) IMPLANT
BLADE SURG 15 STRL LF DISP TIS (BLADE) ×1 IMPLANT
BLADE SURG 15 STRL SS (BLADE) ×2
BLADE SURG SZ10 CARB STEEL (BLADE) ×2 IMPLANT
CATH BONANNO SUPRAPUBIC 14G (CATHETERS) IMPLANT
CATH FOLEY 2WAY SLVR  5CC 18FR (CATHETERS) ×1
CATH FOLEY 2WAY SLVR 5CC 18FR (CATHETERS) ×1 IMPLANT
COVER MAYO STAND STRL (DRAPES) ×2 IMPLANT
DECANTER SPIKE VIAL GLASS SM (MISCELLANEOUS) ×2 IMPLANT
DRAIN PENROSE 18X1/2 LTX STRL (DRAIN) IMPLANT
ELECT PENCIL ROCKER SW 15FT (MISCELLANEOUS) IMPLANT
ELECT REM PT RETURN 9FT ADLT (ELECTROSURGICAL)
ELECTRODE REM PT RTRN 9FT ADLT (ELECTROSURGICAL) IMPLANT
GLOVE BIOGEL M STRL SZ7.5 (GLOVE) ×6 IMPLANT
GOWN STRL REUS W/TWL XL LVL3 (GOWN DISPOSABLE) ×4 IMPLANT
KIT BASIN OR (CUSTOM PROCEDURE TRAY) ×2 IMPLANT
LIQUID BAND (GAUZE/BANDAGES/DRESSINGS) IMPLANT
NEEDLE HYPO 22GX1.5 SAFETY (NEEDLE) ×2 IMPLANT
NS IRRIG 1000ML POUR BTL (IV SOLUTION) IMPLANT
PACK GENERAL/GYN (CUSTOM PROCEDURE TRAY) ×2 IMPLANT
PACKING VAGINAL (PACKING) IMPLANT
PLUG CATH AND CAP STER (CATHETERS) ×2 IMPLANT
RETRACTOR LONRSTAR 16.6X16.6CM (MISCELLANEOUS) ×1 IMPLANT
RETRACTOR STAY HOOK 5MM (MISCELLANEOUS) ×2 IMPLANT
RETRACTOR STER APS 16.6X16.6CM (MISCELLANEOUS) ×2
SHEET LAVH (DRAPES) ×2 IMPLANT
SPONGE LAP 4X18 X RAY DECT (DISPOSABLE) ×6 IMPLANT
SUCTION FRAZIER HANDLE 12FR (TUBING) ×1
SUCTION TUBE FRAZIER 12FR DISP (TUBING) ×1 IMPLANT
SUT CAPIO ETHIBPND (SUTURE) IMPLANT
SUT VIC AB 2-0 UR5 27 (SUTURE) IMPLANT
SUT VIC AB 2-0 UR6 27 (SUTURE) ×4 IMPLANT
SYRINGE 10CC LL (SYRINGE) ×4 IMPLANT
TOWEL OR 17X26 10 PK STRL BLUE (TOWEL DISPOSABLE) ×2 IMPLANT
TOWEL OR NON WOVEN STRL DISP B (DISPOSABLE) ×2 IMPLANT
WATER STERILE IRR 500ML POUR (IV SOLUTION) ×2 IMPLANT
YANKAUER SUCT BULB TIP 10FT TU (MISCELLANEOUS) ×2 IMPLANT

## 2016-06-12 NOTE — Anesthesia Postprocedure Evaluation (Signed)
Anesthesia Post Note  Patient: Natalie West  Procedure(s) Performed: Procedure(s) (LRB): INCISION OF URETHRAL SLING (N/A)  Patient location during evaluation: PACU Anesthesia Type: General Level of consciousness: awake and alert Pain management: pain level controlled Vital Signs Assessment: post-procedure vital signs reviewed and stable Respiratory status: spontaneous breathing, nonlabored ventilation, respiratory function stable and patient connected to nasal cannula oxygen Cardiovascular status: blood pressure returned to baseline and stable Postop Assessment: no signs of nausea or vomiting Anesthetic complications: no    Last Vitals:  Vitals:   06/12/16 1230 06/12/16 1242  BP: 108/66 107/77  Pulse: (!) 59 (!) 58  Resp: 13 16  Temp: 36.3 C 36.5 C    Last Pain:  Vitals:   06/12/16 1230  TempSrc:   PainSc: 0-No pain                 Reginal Lutes

## 2016-06-12 NOTE — Discharge Instructions (Signed)
As discussed with Dr. Gaynelle Arabian pre-operatively.  You may resume aspirin, advil, aleve, vitamins, and supplements 7 days after surgery.  Activity:  You are encouraged to ambulate frequently (about every hour during waking hours) to help prevent blood clots from forming in your legs or lungs.  However, you should not engage in any heavy lifting (> 10-15 lbs), strenuous activity, or straining for 6 weeks. It is OK to start driving when you are off narcotic pain medications. No intercourse or placing anything within the vagina for 6 weeks.   Diet: You may resume a regular diet.  Prescriptions: You will be provided a prescription for pain medication to take as needed.  If your pain is not severe enough to require the prescription pain medication, you may take extra strength Tylenol instead which will have less side effects.  You should also take an over the counter stool softener to avoid straining with bowel movements as the prescription pain medication may constipate you. Do not drive while taking narcotic pain medications. Take Colace and/or Senna while taking narcotic pain medication. You may take over-the-counter Laxatives such as Miralax, Senna, or Milk of Magnesia. Stop taking these medications if you develop diarrhea.  You may start showering (but not soaking or bathing in water) the 2nd day after surgery and the incisions simply need to be patted dry after the shower. No baths or submerging for 2 weeks. Do not scrub over the incisions, simply let soap and water run over them.  Do not soak the incision (such as a bath) for 2 weeks after surgery.  No additional care is needed.  What to call us about: You should call the office [phone]) if you develop fever > 101, significant bleeding, develop persistent vomiting.  Resume home medications as prescribed by your primary care physician.  It is normal to have some vaginal spotting and bleeding for 1-2 weeks following this surgery.  If you are  unable to urinate please call Dr. Arlyn Leak office.  General Anesthesia, Adult, Care After Refer to this sheet in the next few weeks. These instructions provide you with information on caring for yourself after your procedure. Your health care provider may also give you more specific instructions. Your treatment has been planned according to current medical practices, but problems sometimes occur. Call your health care provider if you have any problems or questions after your procedure. WHAT TO EXPECT AFTER THE PROCEDURE After the procedure, it is typical to experience:  Sleepiness.  Nausea and vomiting. HOME CARE INSTRUCTIONS  For the first 24 hours after general anesthesia:  Have a responsible person with you.  Do not drive a car. If you are alone, do not take public transportation.  Do not drink alcohol.  Do not take medicine that has not been prescribed by your health care provider.  Do not sign important papers or make important decisions.  You may resume a normal diet and activities as directed by your health care provider.  Change bandages (dressings) as directed.  If you have questions or problems that seem related to general anesthesia, call the hospital and ask for the anesthetist or anesthesiologist on call. SEEK MEDICAL CARE IF:  You have nausea and vomiting that continue the day after anesthesia.  You develop a rash. SEEK IMMEDIATE MEDICAL CARE IF:   You have difficulty breathing.  You have chest pain.  You have any allergic problems.   This information is not intended to replace advice given to you by your health care provider.  Make sure you discuss any questions you have with your health care provider.   Document Released: 01/25/2001 Document Revised: 11/09/2014 Document Reviewed: 02/17/2012 Elsevier Interactive Patient Education [employer].

## 2016-06-12 NOTE — Op Note (Signed)
Operative Note:  Pre-operative diagnosis: urinary retention  Postoperative diagnosis: Same  Operation: Urethrolysis and incision of midurethral sling  Surgeon:  Carolan Clines, MD  Resident: Virginia Crews, MD  Anesthesia: General anesthesia  Preparation: After appropriate preanesthesia, the patient was brought to the operating room, placed on the operating table in the dorsal supine position. She received peri-operative IV antibiotics. Timeout was observed.  Indications: Please refer to H&P  Findings:  - Successful mobilization of sling with incision of sling performed at midline - No evidence of erosion at any point along the sling  Procedure: The patient was taken to the operating room and general anesthesia was induced. She was placed in high dorsal lithotomy in the usual fashion making sure to pad all pressure points. She was prepped and draped in the standard fashion. Peri-operative antibiotics were administered and we performed a time out safety check.   We began with an exam under anesthesia which showed intact sling with no evidence of erosion. Next, the [facility] retractor is placed, and foley catheter was placed. Using a blue marking pen we marked out our intended midline incision. Next, we injected a mixture of 0.5% marcaine and epinephrine into our intended area of incision and performed additional hydrodissection with local anesthesia between the anterior vaginal mucosa and urethra.  Using a 15 blade we incised along our marking. Next, the sling was bluntly dissected off the mid-urethra with a right angle and ultimately encircled with a right angle making sure not to injure the underlying urethra. Next, we then divided the sling sharply in the midline using curved Mayo scissors. There was no evidence of injury to the urethra or bladder. We ensured excellent hemostasis and the anterior vaginal mucosa was then closed with a running 2-0 Vicryl suture. The bladder was partially  filled and the foley catheter was then removed to prepare for trial of void.              Attestation: Dr. Gaynelle Arabian was present and scrubbed for the entirety of the procedure.

## 2016-06-12 NOTE — Transfer of Care (Signed)
Immediate Anesthesia Transfer of Care Note  Patient: Natalie West  Procedure(s) Performed: Procedure(s) with comments: INCISION OF URETHRAL SLING (N/A) - 1 HOUR  Patient Location: PACU  Anesthesia Type:General  Level of Consciousness:  sedated, patient cooperative and responds to stimulation  Airway & Oxygen Therapy:Patient Spontanous Breathing and Patient connected to face mask oxgen  Post-op Assessment:  Report given to PACU RN and Post -op Vital signs reviewed and stable  Post vital signs:  Reviewed and stable  Last Vitals:  Vitals:   06/12/16 0834  BP: 121/74  Pulse: 72  Resp: 16  Temp: [id number] C    Complications: No apparent anesthesia complications

## 2016-06-12 NOTE — Anesthesia Procedure Notes (Signed)
Procedure Name: LMA Insertion Date/Time: 06/12/2016 10:52 AM Performed by: Lajuana Carry E Pre-anesthesia Checklist: Patient identified, Emergency Drugs available, Suction available and Patient being monitored Patient Re-evaluated:Patient Re-evaluated prior to inductionOxygen Delivery Method: Circle system utilized Preoxygenation: Pre-oxygenation with 100% oxygen Intubation Type: IV induction Ventilation: Mask ventilation without difficulty LMA: LMA inserted LMA Size: 4.0 Number of attempts: 1 Placement Confirmation: positive ETCO2 and breath sounds checked- equal and bilateral Tube secured with: Tape Dental Injury: Teeth and Oropharynx as per pre-operative assessment

## 2016-06-12 NOTE — Anesthesia Preprocedure Evaluation (Addendum)
Anesthesia Evaluation  Patient identified by MRN, date of birth, ID band Patient awake    Reviewed: Allergy & Precautions, NPO status , Patient's Chart, lab work & pertinent test results  History of Anesthesia Complications (+) PONV and history of anesthetic complications  Airway Mallampati: II  TM Distance: >3 FB Neck ROM: Full    Dental no notable dental hx.    Pulmonary neg pulmonary ROS,    Pulmonary exam normal breath sounds clear to auscultation       Cardiovascular hypertension, Pt. on medications Normal cardiovascular exam+ dysrhythmias Atrial Fibrillation  Rhythm:Regular Rate:Normal     Neuro/Psych negative neurological ROS  negative psych ROS   GI/Hepatic Neg liver ROS, hiatal hernia, GERD  Medicated and Controlled,  Endo/Other  negative endocrine ROS  Renal/GU negative Renal ROS  negative genitourinary   Musculoskeletal negative musculoskeletal ROS (+)   Abdominal   Peds negative pediatric ROS (+)  Hematology negative hematology ROS (+)   Anesthesia Other Findings   Reproductive/Obstetrics negative OB ROS                            Anesthesia Physical  Anesthesia Plan  ASA: III  Anesthesia Plan: General   Post-op Pain Management:    Induction: Intravenous  Airway Management Planned: LMA  Additional Equipment:   Intra-op Plan:   Post-operative Plan: Extubation in OR  Informed Consent: I have reviewed the patients History and Physical, chart, labs and discussed the procedure including the risks, benefits and alternatives for the proposed anesthesia with the patient or authorized representative who has indicated his/her understanding and acceptance.   Dental advisory given  Plan Discussed with: CRNA  Anesthesia Plan Comments:        Anesthesia Quick Evaluation

## 2016-06-12 NOTE — Interval H&P Note (Signed)
History and Physical Interval Note:  06/12/2016 10:25 AM  Natalie West  has presented today for surgery, with the diagnosis of INCOMPLETE BLADDER EMPTYING  The various methods of treatment have been discussed with the patient and family. After consideration of risks, benefits and other options for treatment, the patient has consented to  Procedure(s) with comments: [name] (N/A) - 1 HOUR as a surgical intervention .  The patient's history has been reviewed, patient examined, no change in status, stable for surgery.  I have reviewed the patient's chart and labs.  Questions were answered to the patient's satisfaction.     Marqueta Pulley I Lavonne Cass

## 2016-07-09 DIAGNOSIS — R3914 Feeling of incomplete bladder emptying: Secondary | ICD-10-CM | POA: Diagnosis not present

## 2016-08-25 DIAGNOSIS — R3914 Feeling of incomplete bladder emptying: Secondary | ICD-10-CM | POA: Diagnosis not present

## 2016-08-25 DIAGNOSIS — N393 Stress incontinence (female) (male): Secondary | ICD-10-CM | POA: Diagnosis not present

## 2016-09-21 DIAGNOSIS — H02403 Unspecified ptosis of bilateral eyelids: Secondary | ICD-10-CM | POA: Diagnosis not present

## 2016-09-21 DIAGNOSIS — H40013 Open angle with borderline findings, low risk, bilateral: Secondary | ICD-10-CM | POA: Diagnosis not present

## 2016-09-21 DIAGNOSIS — H524 Presbyopia: Secondary | ICD-10-CM | POA: Diagnosis not present

## 2016-10-07 ENCOUNTER — Ambulatory Visit (INDEPENDENT_AMBULATORY_CARE_PROVIDER_SITE_OTHER): Payer: PPO

## 2016-10-07 ENCOUNTER — Ambulatory Visit (INDEPENDENT_AMBULATORY_CARE_PROVIDER_SITE_OTHER): Payer: PPO | Admitting: Family

## 2016-10-07 VITALS — Ht 62.0 in | Wt 146.0 lb

## 2016-10-07 DIAGNOSIS — S62627A Displaced fracture of medial phalanx of left little finger, initial encounter for closed fracture: Secondary | ICD-10-CM

## 2016-10-07 DIAGNOSIS — M79642 Pain in left hand: Secondary | ICD-10-CM | POA: Diagnosis not present

## 2016-10-07 DIAGNOSIS — M20012 Mallet finger of left finger(s): Secondary | ICD-10-CM | POA: Diagnosis not present

## 2016-10-07 NOTE — Progress Notes (Signed)
Office Visit Note   Patient: Natalie West           Date of Birth: [birth year]           MRN: [id number] Visit Date: 10/07/2016              Requested by: Christain Sacramento, MD [address] [place] [address] [place], [place] [address] PCP: Woody Seller, MD (Inactive)   Assessment & Plan: Visit Diagnoses:  1. Mallet finger of left finger(s)   2. Pain in left hand   3. Closed displaced fracture of middle phalanx of left little finger, initial encounter     Plan: Have sent in a referral for hand therapy. She will follow with them for the mallet finger. Placed the left little finger in a splint today. She will follow up with [place] in 3-4 weeks if no better.   Follow-Up Instructions: Return if symptoms worsen or fail to improve.   Orders:  Orders Placed This Encounter  Procedures  . XR Finger Middle Left   No orders of the defined types were placed in this encounter.     Procedures: No procedures performed   Clinical Data: No additional findings.   Subjective: Chief Complaint  Patient presents with  . Left Hand - Pain, Injury    About 1 month    Patient is an 82 year old woman who is seen for left little finger pain. She "jammed" her little finger about a month ago. States that the finger " has been bent ever since" Is tender. Thought it would get better on its own, wishes she had come in sooner.    Injury     Review of Systems  Constitutional: Negative for chills and fever.     Objective: Vital Signs: Ht 5\' 2"  (1.575 m)   Wt 146 lb (66.2 kg)   LMP 11/03/1983   BMI 26.70 kg/m   Physical Exam  Constitutional: She is oriented to person, place, and time. She appears well-developed and well-nourished.  Pulmonary/Chest: Effort normal.  Neurological: She is alert and oriented to person, place, and time.  Psychiatric: She has a normal mood and affect.  Nursing note reviewed.   Right Hand Exam   Tenderness  The patient is experiencing tenderness in the dorsal  area.  Other  Pulse: present  Comments:  Tender at DIP, Mallet deformity    No wounds, erythema or ecchymosis.   Specialty Comments:  No specialty comments available.  Imaging: No results found.   PMFS History: Patient Active Problem List   Diagnosis Date Noted  . Mallet finger of left finger(s) 10/08/2016  . Recurrent urinary tract infection 01/20/2015  . Cystocele 01/20/2015  . Constipation 08/22/2014  . Rotator cuff (capsule) sprain 02/28/2013  . Breast cancer of lower-outer quadrant of right female breast ([place])   . Internal hemorrhoids 06/28/2011  . Gastroenteritis 06/28/2011  . HYPERLIPIDEMIA 02/18/2008  . GERD 02/18/2008  . HIATAL HERNIA 02/18/2008  . OSTEOARTHRITIS 02/18/2008  . COLONIC POLYPS, ADENOMATOUS 02/09/2007  . DIVERTICULITIS, ACUTE 02/09/2007  . ESOPHAGEAL STRICTURE 11/19/1998   Past Medical History:  Diagnosis Date  . Anxiety   . Arthritis   . Breast cancer ([place]) 2006   right breast  . Chronic cough   . Chronic UTI   . Depression   . Dysrhythmia   . Esophageal reflux   . Esophageal stricture   . Family history of malignant neoplasm of gastrointestinal tract   . GERD (gastroesophageal reflux disease)   .  Hearing loss   . Hemorrhoids    internal  . History of hiatal hernia   . Hypercholesterolemia   . Hypertension   . Numbness and tingling    feeet, occasionally  . Osteopenia   . Personal history of colonic polyps 02/09/2007   TUBULAR ADENOMA  . PONV (postoperative nausea and vomiting)    trouble opening mouth and jaw, jaw pops, trouble turning head  . Rotator cuff tear    RT  . Status post dilation of esophageal narrowing   . Umbilical hernia     Family History  Problem Relation Age of Onset  . Colon cancer Mother 93  . Kidney cancer Mother   . Glaucoma Mother   . Heart disease Sister   . Stomach cancer Brother   . Stroke Sister   . Lung cancer Brother   . Clotting disorder Son   . Emphysema Father     smoker  .  Hypertension Sister     Past Surgical History:  Procedure Laterality Date  . BREAST LUMPECTOMY  2006   right lumpectomy  . CATARACT EXTRACTION W/ INTRAOCULAR LENS IMPLANT Bilateral   . COLONOSCOPY    . CYSTOCELE REPAIR N/A 04/20/2016   Procedure: AUGMENTED ANTERIOR VAULT REPAIR, COLOPLAST DERMIS GRAFT KELLY PLICATION SACROSPINOUS FIXATION;  Surgeon: Carolan Clines, MD;  Location: [facility];  Service: Urology;  Laterality: N/A;  . DILATION AND CURETTAGE OF UTERUS    . ESOPHAGOGASTRODUODENOSCOPY ENDOSCOPY    . PUBOVAGINAL SLING N/A 04/20/2016   Procedure: PUBO-VAGINAL [name];  Surgeon: Carolan Clines, MD;  Location: [facility];  Service: Urology;  Laterality: N/A;  . REVISION URINARY SLING N/A 06/12/2016   Procedure: INCISION OF URETHRAL SLING;  Surgeon: Carolan Clines, MD;  Location: [facility];  Service: Urology;  Laterality: N/A;  1 HOUR  . SHOULDER OPEN ROTATOR CUFF REPAIR Right 02/28/2013   Procedure: RIGHT ROTATOR CUFF REPAIR SHOULDER OPEN WITH GRAFT AND ANCHORS;  Surgeon: Tobi Bastos, MD;  Location: [facility];  Service: Orthopedics;  Laterality: Right;  . UMBILICAL HERNIA REPAIR     Social History   Occupational History  . retired Retired   Social History Main Topics  . Smoking status: Never Smoker  . Smokeless tobacco: Never Used  . Alcohol use No  . Drug use: No  . Sexual activity: No

## 2016-10-08 ENCOUNTER — Telehealth (INDEPENDENT_AMBULATORY_CARE_PROVIDER_SITE_OTHER): Payer: Self-pay | Admitting: Orthopedic Surgery

## 2016-10-08 DIAGNOSIS — M20012 Mallet finger of left finger(s): Secondary | ICD-10-CM | POA: Insufficient documentation

## 2016-10-08 NOTE — Telephone Encounter (Signed)
Pt calling regarding prior authorization for hand therapy with [employer]. She said [employer] needed authorization from [place]. Pt requested call back at [phone]

## 2016-10-09 NOTE — Telephone Encounter (Signed)
I called left voicemail for [facility] since there was no answer, asking what they need exactly. I called patient to make her aware. We have never had to get a prior authorization for physical therapy. I have faxed office note [phone].

## 2016-10-13 ENCOUNTER — Telehealth (INDEPENDENT_AMBULATORY_CARE_PROVIDER_SITE_OTHER): Payer: Self-pay | Admitting: Orthopedic Surgery

## 2016-10-13 DIAGNOSIS — S62627A Displaced fracture of medial phalanx of left little finger, initial encounter for closed fracture: Secondary | ICD-10-CM

## 2016-10-13 NOTE — Telephone Encounter (Signed)
I called and spoke with Elmyra Ricks from [employer], [employer] is out of network for this patient and she would have to pay a higher deductible. Advised patient we would want to send her somewhere else so she does not have to occur a higher expense. Advised her we will send a referral to [facility] for hand therapy. Will need to get specifics of referral from [facility]. Than I will input in EPIC. Advised patient to call their office if they do not contact her in the next couple days.

## 2016-10-13 NOTE — Telephone Encounter (Signed)
Received call from Elmyra Ricks at [facility] stating returning your call on this pt, states she needed the insurance information on pt, I gave her the information and states they are a higher copay with [employer] but will see her. [employer] does require a referral.

## 2016-10-13 NOTE — Telephone Encounter (Signed)
Patient calling re PT for finger.  She states prior Natalie West is needed for PT and hand on [place]

## 2016-10-13 NOTE — Telephone Encounter (Signed)
I called and spoke with Elmyra Ricks from [place], [employer] is out of network for this patient and she would have to pay a higher deductible. Advised patient we would want to send her somewhere else so she does not have to occur a higher expense. Advised her we will send a referral to [facility] for hand therapy. Will need to get specifics of referral from [facility]. Than I will input in EPIC. Advised patient to call their office if they do not contact her in the next couple days.

## 2016-10-14 NOTE — Telephone Encounter (Signed)
I spoke with patient this afternoon to advise she can have a longer finger splint placed on her, she can pick this up tomorrow, advised her to just call before she comes so we can have this ready for her.

## 2016-10-14 NOTE — Telephone Encounter (Signed)
I spoke with Natalie West this morning who had done original referral to confirm this is for hand therapy only and she.

## 2016-10-22 ENCOUNTER — Ambulatory Visit: Payer: PPO | Attending: Family | Admitting: Occupational Therapy

## 2016-10-22 DIAGNOSIS — M25642 Stiffness of left hand, not elsewhere classified: Secondary | ICD-10-CM | POA: Diagnosis not present

## 2016-10-22 DIAGNOSIS — M79645 Pain in left finger(s): Secondary | ICD-10-CM | POA: Diagnosis not present

## 2016-10-22 NOTE — Therapy (Signed)
[facility] [address] [place], [place], [address] Phone: [phone]   Fax:  [phone]  Occupational Therapy Evaluation  Patient Details  Name: Natalie West MRN: [id number] Date of Birth: Mar 30, 1931 Referring Provider: Dondra Prader, NP  Encounter Date: 10/22/2016      OT End of Session - 10/22/16 1701    Visit Number 1   Number of Visits 8   Date for OT Re-Evaluation 12/22/16   Authorization Type [employer] - G code required   Authorization - Visit Number 1   Authorization - Number of Visits 10   OT Start Time [id number]   OT Stop Time 1445   OT Time Calculation (min) 40 min   Activity Tolerance Patient tolerated treatment well      Past Medical History:  Diagnosis Date  . Anxiety   . Arthritis   . Breast cancer ([place]) 2006   right breast  . Chronic cough   . Chronic UTI   . Depression   . Dysrhythmia   . Esophageal reflux   . Esophageal stricture   . Family history of malignant neoplasm of gastrointestinal tract   . GERD (gastroesophageal reflux disease)   . Hearing loss   . Hemorrhoids    internal  . History of hiatal hernia   . Hypercholesterolemia   . Hypertension   . Numbness and tingling    feeet, occasionally  . Osteopenia   . Personal history of colonic polyps 02/09/2007   TUBULAR ADENOMA  . PONV (postoperative nausea and vomiting)    trouble opening mouth and jaw, jaw pops, trouble turning head  . Rotator cuff tear    RT  . Status post dilation of esophageal narrowing   . Umbilical hernia     Past Surgical History:  Procedure Laterality Date  . BREAST LUMPECTOMY  2006   right lumpectomy  . CATARACT EXTRACTION W/ INTRAOCULAR LENS IMPLANT Bilateral   . COLONOSCOPY    . CYSTOCELE REPAIR N/A 04/20/2016   Procedure: AUGMENTED ANTERIOR VAULT REPAIR, COLOPLAST DERMIS GRAFT Sharalyn Lomba PLICATION SACROSPINOUS FIXATION;  Surgeon: Carolan Clines, MD;  Location: [facility];  Service: Urology;   Laterality: N/A;  . DILATION AND CURETTAGE OF UTERUS    . ESOPHAGOGASTRODUODENOSCOPY ENDOSCOPY    . PUBOVAGINAL SLING N/A 04/20/2016   Procedure: PUBO-VAGINAL [name];  Surgeon: Carolan Clines, MD;  Location: [facility];  Service: Urology;  Laterality: N/A;  . REVISION URINARY SLING N/A 06/12/2016   Procedure: INCISION OF URETHRAL SLING;  Surgeon: Carolan Clines, MD;  Location: [facility];  Service: Urology;  Laterality: N/A;  1 HOUR  . SHOULDER OPEN ROTATOR CUFF REPAIR Right 02/28/2013   Procedure: RIGHT ROTATOR CUFF REPAIR SHOULDER OPEN WITH GRAFT AND ANCHORS;  Surgeon: Tobi Bastos, MD;  Location: [facility];  Service: Orthopedics;  Laterality: Right;  . UMBILICAL HERNIA REPAIR      There were no vitals filed for this visit.      Subjective Assessment - 10/22/16 1412    Subjective  No pain but that joint is sore   Patient Stated Goals Get my finger better   Currently in Pain? Yes   Pain Score 8    Pain Location Finger (Comment which one)  small at DIP joint   Pain Orientation Left   Pain Descriptors / Indicators Aching   Pain Type Chronic pain   Pain Onset More than a month ago   Pain Frequency Intermittent   Aggravating Factors  with P/ROM in  joint extension   Pain Relieving Factors REST           [facility] OT Assessment - 10/22/16 0001      Assessment   Diagnosis mallet finger (displaced fracture) Lt small finger   Referring Provider Dondra Prader, NP   Onset Date --  approx 6 weeks ago - pt can't recall date   Assessment Pt presents with flexion at small finger DIP joint   Prior Therapy none     Precautions   Precautions Fall     Restrictions   Weight Bearing Restrictions No     Balance Screen   Has the patient fallen in the past 6 months Yes   How many times? 1   Has the patient had a decrease in activity level because of a fear of falling?  No   Is the patient reluctant to leave their home because of a fear of falling?  No     Home  Environment   Bathroom  Shower/Tub Tub/Shower unit;Door   Additional Comments Pt lives in 1 story home, 4 steps to enter with handrail on Rt going up   Lives With Alone  son lives in trailer behind home     Prior Function   Level of Independence Independent     ADL   Eating/Feeding Independent   Grooming Independent   Scientist, clinical (histocompatibility and immunogenetics) Independent   Lower Body Bathing Independent   Upper Body Dressing Independent   Lower Body Dressing Independent   Toilet Tranfer Independent  BSC for pm   Tub/Shower Transfer Independent  grab bars in [place] care of all shopping needs independently   Light Housekeeping Does personal laundry completely;Maintains house alone or with occasional assistance   Meal Prep Plans, prepares and serves adequate meals independently   Programmer, applications own vehicle   Medication Management Is responsible for taking medication in correct dosages at correct time   Physiological scientist financial matters independently (budgets, writes checks, pays rent, bills goes to bank), collects and keeps track of income     Mobility   Mobility Status Independent     Written Expression   Dominant Hand Right   Handwriting --  denies change     Vision - History   Baseline Vision Wears glasses only for reading     Cognition   Overall Cognitive Status Difficult to assess  Pt required multiple cues for 1 exercise     Observation/Other Assessments   Observations Pt reports not seeing MD until 3-4 weeks after it happened. Xray showed closed displaced fracture. Pt was given pre-fab splint to wear for DIP joint but pt reports MD never instructed her in splint wearing time.      Sensation   Light Touch Appears Intact  per pt report     Coordination   9 Hole Peg Test Right;Left   Right 9 Hole Peg Test 24.31 sec   Left 9 Hole Peg Test 25.47 sec     Edema   Edema moderate at Lt small DIP joint     ROM / Strength   AROM / PROM / Strength AROM     AROM    Overall AROM Comments BUE AROM WNL's except Lt small DIP joint. DIP remains flexed in 45 degrees with no active extension. Therapist can passively get full extension with difficulty d/t stiffness and pain.  OT Treatments/Exercises (OP) - 10/22/16 0001      ADLs   ADL Comments Pt educated in mallet finger and how DIP joint needs to remain in full extension for 6 weeks to correct, but warned pt this may not correct it since she has already been allowed to go into flexion and at least 6 weeks before receiving therapy. Pt also instructed to wear mallet finger pre-fab splint that therapist provided while showering and then remove keeing DIP joint in full extension to clean finger and splint and air dry finger. Therapist demo how to do this     Exercises   Exercises --  see pt instructions for active PIP flex w/ DIP splint on     Splinting   Splinting Pt issued pre-fab mallet finger splint to keep DIP joint in extension while allowing MP and PIP motion. Pt instructed to wear at all times, except to clean finger, splint, and air dry while keeping DIP joint in full extension               OT Education - 10/22/16 1447    Education provided Yes   Education Details splint wear and care, hygiene care, and PIP A/ROM HEP    Person(s) Educated Patient   Methods Explanation;Demonstration;Handout   Comprehension Verbalized understanding;Returned demonstration             OT Long Term Goals - 10/22/16 1707      OT LONG TERM GOAL #1   Title Pt independent with splint wear and care   Time 8   Period Weeks   Status On-going     OT LONG TERM GOAL #2   Title Pt to report pain 5/10 or under with P/ROM in DIP extension   Time 8   Period Weeks   Status New     OT LONG TERM GOAL #3   Title Pt to achieve no more than a 25 degree extension lag Lt small DIP joint   Baseline -77*   Time 8   Period Weeks   Status New               Plan - 10/22/16  1703    Clinical Impression Statement Pt is a 82 y.o. female who presents to outpatient rehab s/p displaced fracture of Lt small finger (mallet finger) with DIP swelling, pain, and 45* flexion with no active extension. Pt referred to O.T. to address these deficits   Rehab Potential Fair   Clinical Impairments Affecting Rehab Potential time since onset   OT Frequency 1x / week   OT Duration 8 weeks   OT Treatment/Interventions Self-care/ADL training;Moist Heat;Fluidtherapy;DME and/or AE instruction;Splinting;Patient/family education;Compression bandaging;Therapeutic exercises;Therapeutic activities;Passive range of motion;Cryotherapy;Parrafin;Manual Therapy   Plan review wear and care, proper donning/doffing while keeping DIP straight, review PIP flexion and add MP flexion ex with DIP splint on. Adjust and/or fabricate new splint prn (as therapist could not d/t time constraints this session)   Consulted and Agree with Plan of Care Patient      Patient will benefit from skilled therapeutic intervention in order to improve the following deficits and impairments:  Decreased range of motion, Increased edema, Decreased knowledge of precautions, Impaired UE functional use, Pain, Decreased strength  Visit Diagnosis: Stiffness of left hand, not elsewhere classified - Plan: Ot plan of care cert/re-cert  Pain in left finger(s) - Plan: Ot plan of care cert/re-cert      G-Codes - [id number]    Functional Assessment Tool Used clinical judgment/observation  re: DIP joint Lt small finger   Functional Limitation Other OT subsequent   Other OT Secondary Current Status [id number]) At least 60 percent but less than 80 percent impaired, limited or restricted   Other OT Secondary Goal Status [id number]) At least 20 percent but less than 40 percent impaired, limited or restricted      Problem List Patient Active Problem List   Diagnosis Date Noted  . Mallet finger of left finger(s) 10/08/2016  . Recurrent  urinary tract infection 01/20/2015  . Cystocele 01/20/2015  . Constipation 08/22/2014  . Rotator cuff (capsule) sprain 02/28/2013  . Breast cancer of lower-outer quadrant of right female breast ([place])   . Internal hemorrhoids 06/28/2011  . Gastroenteritis 06/28/2011  . HYPERLIPIDEMIA 02/18/2008  . GERD 02/18/2008  . HIATAL HERNIA 02/18/2008  . OSTEOARTHRITIS 02/18/2008  . COLONIC POLYPS, ADENOMATOUS 02/09/2007  . DIVERTICULITIS, ACUTE 02/09/2007  . ESOPHAGEAL STRICTURE 11/19/1998    Carey Bullocks, OTR/L 10/22/2016, 5:13 PM  [facility] [address] [place], [place], [address] Phone: [phone]   Fax:  [phone]  Name: Natalie West MRN: [id number] Date of Birth: 17-Mar-1931

## 2016-10-22 NOTE — Patient Instructions (Signed)
AROM: PIP Flexion / Extension    Pinch bottom knuckle of __small______ finger of right hand to prevent bending. Actively bend middle knuckle until stretch is felt. Hold __3__ seconds. Relax. Straighten finger as far as possible. Repeat _15___ times per set.  Do _6___ sessions per day. ONLY with tip splint on  Copyright  VHI. All rights reserved.

## 2016-10-27 ENCOUNTER — Ambulatory Visit: Payer: PPO | Admitting: Occupational Therapy

## 2016-11-06 ENCOUNTER — Ambulatory Visit (HOSPITAL_BASED_OUTPATIENT_CLINIC_OR_DEPARTMENT_OTHER): Payer: PPO | Admitting: Adult Health

## 2016-11-06 VITALS — BP 134/80 | HR 57 | Temp 97.6°F | Resp 16 | Ht 62.0 in | Wt 152.6 lb

## 2016-11-06 DIAGNOSIS — Z853 Personal history of malignant neoplasm of breast: Secondary | ICD-10-CM

## 2016-11-06 DIAGNOSIS — Z170 Estrogen receptor positive status [ER+]: Secondary | ICD-10-CM | POA: Diagnosis not present

## 2016-11-06 DIAGNOSIS — Z1231 Encounter for screening mammogram for malignant neoplasm of breast: Secondary | ICD-10-CM

## 2016-11-06 DIAGNOSIS — C50511 Malignant neoplasm of lower-outer quadrant of right female breast: Secondary | ICD-10-CM

## 2016-11-06 NOTE — Progress Notes (Signed)
CLINIC:  Survivorship   REASON FOR VISIT:  Routine follow-up for history of breast cancer.   BRIEF ONCOLOGIC HISTORY:    Breast cancer of lower-outer quadrant of right female breast ([facility])   11/10/2004 Surgery    Right breast lumpectomy invasive ductal carcinoma grade 1; 0.5 cm 2 sentinel lymph nodes negative ER 85% PR 0% Ki-67 19% HER-2 2+ by IHC and negative by fish, no lymphovascular invasion T1, N0, M0 stage IA      12/29/2004 - 01/26/2005 Radiation Therapy    Radiation therapy to lumpectomy site      02/26/2005 - 02/26/2010 Anti-estrogen oral therapy    Aromasin 25 mg by mouth daily: Side effects including depression.        INTERVAL HISTORY:  Natalie West presents to the [facility] today for routine follow-up for her history of breast cancer.  Her last mammogram was done on 12/02/15 and was negative. She has not had her annual 2018 mammogram yet. She tells me, "I feel some stuff in my left breast and armpit, some lumps, but I'm not sure if it's anything or not. I want you to look at it."   She has several chronic complaints today. She has chronic hearing loss and tinnitus. Reports recent runny nose; "I might be catching a cold or something."  Reports difficulty swallowing at times; "I had to have my esophagus stretched in the past."  She has intermittent shortness of breast with exertion; she also has a chronic dry cough, which she think may be improving some.   She has intermittent nausea; it is worse when she drinks coffee. She has chronic heartburn and abdominal pain.  Also reports chronic, intermittent diarrhea. She has a GI specialist, but tells me she has not seen them in quite some time.  She also reports difficulty walking at times, which she attributes to her age.   She has been feeling anxious and depressed lately. Her husband died nearly 1 year ago.  One of her first cousins whom she was very close to died recently; she attended the funeral this morning.       REVIEW OF SYSTEMS:  ROS per HPI; otherwise negative.  Breast: Patient concerned about possible left breast lump. Otherwise, no complaints.     A 14-point review of systems was completed and was negative, except as noted above.    PAST MEDICAL/SURGICAL HISTORY:  Past Medical History:  Diagnosis Date  . Anxiety   . Arthritis   . Breast cancer ([facility]) 2006   right breast  . Chronic cough   . Chronic UTI   . Depression   . Dysrhythmia   . Esophageal reflux   . Esophageal stricture   . Family history of malignant neoplasm of gastrointestinal tract   . GERD (gastroesophageal reflux disease)   . Hearing loss   . Hemorrhoids    internal  . History of hiatal hernia   . Hypercholesterolemia   . Hypertension   . Numbness and tingling    feeet, occasionally  . Osteopenia   . Personal history of colonic polyps 02/09/2007   TUBULAR ADENOMA  . PONV (postoperative nausea and vomiting)    trouble opening mouth and jaw, jaw pops, trouble turning head  . Rotator cuff tear    RT  . Status post dilation of esophageal narrowing   . Umbilical hernia    Past Surgical History:  Procedure Laterality Date  . BREAST LUMPECTOMY  2006   right lumpectomy  . CATARACT EXTRACTION  W/ INTRAOCULAR LENS IMPLANT Bilateral   . COLONOSCOPY    . CYSTOCELE REPAIR N/A 04/20/2016   Procedure: AUGMENTED ANTERIOR VAULT REPAIR, COLOPLAST DERMIS GRAFT KELLY PLICATION SACROSPINOUS FIXATION;  Surgeon: Carolan Clines, MD;  Location: [facility];  Service: Urology;  Laterality: N/A;  . DILATION AND CURETTAGE OF UTERUS    . ESOPHAGOGASTRODUODENOSCOPY ENDOSCOPY    . PUBOVAGINAL SLING N/A 04/20/2016   Procedure: PUBO-VAGINAL [name];  Surgeon: Carolan Clines, MD;  Location: [facility];  Service: Urology;  Laterality: N/A;  . REVISION URINARY SLING N/A 06/12/2016   Procedure: INCISION OF URETHRAL SLING;  Surgeon: Carolan Clines, MD;  Location: [facility];  Service: Urology;  Laterality: N/A;  1 HOUR  . SHOULDER  OPEN ROTATOR CUFF REPAIR Right 02/28/2013   Procedure: RIGHT ROTATOR CUFF REPAIR SHOULDER OPEN WITH GRAFT AND ANCHORS;  Surgeon: Tobi Bastos, MD;  Location: [facility];  Service: Orthopedics;  Laterality: Right;  . UMBILICAL HERNIA REPAIR       ALLERGIES:  Allergies  Allergen Reactions  . Ciprofloxacin Other (See Comments)    Reaction unknown  . Clindamycin/Lincomycin Other (See Comments)    Reaction unknown   . Codeine Nausea And Vomiting  . Diclofenac Other (See Comments)    Reaction unknown   . Fenoprofen Calcium Other (See Comments)    Reaction unknown   . Flexeril [Cyclobenzaprine Hcl] Other (See Comments)    Reaction unknown   . Hydrocodone Other (See Comments)    "Last time it was too strong and messed up my mind"  . Macrobid [Nitrofurantoin] Other (See Comments)    Reaction unknown   . Noroxin [Norfloxacin] Other (See Comments)    Reaction unknown   . Relafen [Nabumetone] Other (See Comments)    Reaction unknown   . Sulfa Antibiotics Other (See Comments)    Reaction unknown      CURRENT MEDICATIONS:  Outpatient Encounter Prescriptions as of 11/06/2016  Medication Sig Note  . acetaminophen (TYLENOL) 325 MG tablet Take 650 mg by mouth every 6 (six) hours as needed for moderate pain.   Marland Kitchen atenolol (TENORMIN) 25 MG tablet TAKE ONE TABLET DAILY TO PREVENT PALPITATIONS. 06/10/2016: Takes at hs  . bethanechol (URECHOLINE) 25 MG tablet Take 25 mg by mouth 2 (two) times daily.   . Calcium Carb-Cholecalciferol (CALCIUM 600 + D PO) Take 1 tablet by mouth daily.   . fluticasone (CUTIVATE) 0.005 % ointment Apply 1 application topically daily as needed (itching).   . hydrochlorothiazide (MICROZIDE) 12.5 MG capsule Take 12.5 mg by mouth daily.   . meclizine (ANTIVERT) 12.5 MG tablet Take 12.5 mg by mouth 2 (two) times daily.   . Multiple Vitamin (MULTIVITAMIN WITH MINERALS) TABS tablet Take 1 tablet by mouth daily.   Marland Kitchen omeprazole (PRILOSEC) 20 MG capsule Take 20 mg by mouth  daily as needed (For heartburn or acid reflux.).    Marland Kitchen perphenazine-amitriptyline (ETRAFON/TRIAVIL) 2-10 MG TABS Take 1 tablet by mouth 2 (two) times daily.   . polyethylene glycol powder (GLYCOLAX/MIRALAX) powder Take 17 g by mouth daily.   . pravastatin (PRAVACHOL) 20 MG tablet Take 20 mg by mouth at bedtime.    Marland Kitchen HYDROmorphone (DILAUDID) 2 MG tablet Take 0.5 tablets (1 mg total) by mouth every 4 (four) hours as needed for moderate pain or severe pain. (Patient not taking: Reported on 11/06/2016)   . traMADol (ULTRAM) 50 MG tablet Take 2 tablets (100 mg total) by mouth every 6 (six) hours as needed. (Patient not taking: Reported on 11/06/2016)   . [  DISCONTINUED] docusate sodium (COLACE) 100 MG capsule Take 1 capsule (100 mg total) by mouth 2 (two) times daily. Hold for diarrhea. (Patient not taking: Reported on 11/06/2016)   . [DISCONTINUED] fosfomycin (MONUROL) 3 g PACK Take 3 g by mouth once. Take 3g on Tuesday and 3g on Friday (Patient not taking: Reported on 11/06/2016)   . [DISCONTINUED] tamsulosin (FLOMAX) 0.4 MG CAPS capsule Take 0.4 mg by mouth every morning.     No facility-administered encounter medications on file as of 11/06/2016.      ONCOLOGIC FAMILY HISTORY:  Family History  Problem Relation Age of Onset  . Colon cancer Mother 58  . Kidney cancer Mother   . Glaucoma Mother   . Heart disease Sister   . Stomach cancer Brother   . Stroke Sister   . Lung cancer Brother   . Clotting disorder Son   . Emphysema Father     smoker  . Hypertension Sister     GENETIC COUNSELING/TESTING: No records available for review.   SOCIAL HISTORY:  Natalie West is widowed and lives alone in [place], [place]. She has 1 son, who is disabled due to his blindness.  He helps care for her as best he can.  She does not have any grandchildren.  She enjoys taking care of some stray cats that come by her house.  She denies any current tobacco, alcohol, or illicit drug use.    PHYSICAL EXAMINATION:  Vital  Signs: Vitals:   11/06/16 1425  BP: 134/80  Pulse: (!) 57  Resp: 16  Temp: 97.6 F (36.4 C)   Filed Weights   11/06/16 1425  Weight: 152 lb 9.6 oz (69.2 kg)   General: Elderly female in no acute distress.  Unaccompanied today.   HEENT: Head is normocephalic.  Pupils equal and reactive to light. Conjunctivae clear without exudate.  Sclerae anicteric. Oral mucosa is pink, moist.  Oropharynx is pink without lesions or erythema.  Lymph: No cervical, supraclavicular, or infraclavicular lymphadenopathy noted on palpation.  Cardiovascular: Regular rate and rhythm.Marland Kitchen Respiratory: Clear to auscultation bilaterally. Chest expansion symmetric; breathing non-labored.  Breast Exam:  -Left breast: No appreciable masses on palpation. No skin redness, thickening, or peau d'orange appearance; no nipple retraction or nipple discharge. No palpable abnormalities in area of patient's reported concerns; normal fibroglandular tissue present without mass/nodularity.  -Right breast: No appreciable masses on palpation. No skin redness, thickening, or peau d'orange appearance; no nipple retraction or nipple discharge; healed scar without erythema or nodularity. -Axilla: No axillary adenopathy bilaterally.  GI: Abdomen soft and round; non-tender, non-distended. Bowel sounds normoactive. No hepatosplenomegaly.   GU: Deferred.  Neuro: No focal deficits. Steady gait.  Psych: Mood and affect normal and appropriate for situation.  Extremities: No edema. Skin: Warm and dry.  LABORATORY DATA:  None for this visit.   DIAGNOSTIC IMAGING:  Most recent mammogram: 12/02/15     ASSESSMENT AND PLAN:  Ms.. Jehle is a pleasant 83 y.o. female with history of Stage IA right breast invasive ductal carcinoma, ER+/PR-/HER2-, diagnosed in 11/2004; treated with lumpectomy, adjuvant radiation therapy, and anti-estrogen therapy with Aromasin for 5 years, which she completed in 01/2010.  She presents to the [facility] for  surveillance and routine follow-up.   1. History of Stage IA right breast cancer:  Ms. Yankey is currently clinically without evidence of disease or recurrence of breast cancer. She will be due for her 2018 mammogram later this month; orders placed today.  She has completed 5 years  of anti-estrogen therapy; no need to resume.  We discussed that since she is now 11 years out from her original breast cancer diagnosis, she has the option to "graduate" from follow-up here at the cancer center.  However, she feels more comfortable having [place] evaluate her once per year, which is very reasonable.  She will return to the cancer center to see Survivorship NP  in 11/2017.  I encouraged her to call me with any questions or concerns before her next visit at the cancer center, and I would be happy to see her sooner, if needed.    2. Multiple chronic complaints: Strongly recommended she follow-up with her PCP and specialists regarding her multiple concerns.  I provided reassurance that I do not think her reported concerns are related to her history of breast cancer, but they do warrant additional evaluation by her other specialists. She agreed with this plan.    3. Bone health:  Given Ms. Danish age, history of breast cancer, and her previous 5 years of anti-estrogen therapy with Aromasin, she is at risk for bone demineralization. Her last DEXA scan was on 10/23/10 and showed osteopenia.  Given that she has completed her aromatase inhibitor therapy, which can weaken bone density, I will defer any future bone mineral density testing to her PCP, as clinically indicated. I strongly recommended she talk with her PCP about getting a repeat DEXA scan, as they deem indicated.  In the meantime, she was encouraged to increase her consumption of foods rich in calcium, as well as increase her weight-bearing activities.  She was given education on specific food and activities to promote bone health.    Dispo:  -2018 mammogram due at the  end of January; orders placed today.  -Return to cancer center to see Survivorship NP in 11/2017.   A total of 30 minutes of face-to-face time was spent with this patient with greater than 50% of that time in counseling and care-coordination.   Mike Craze, NP [facility] [phone]   Note: PRIMARY CARE PROVIDER Woody Seller, MD [phone]

## 2016-11-06 NOTE — Patient Instructions (Signed)
-  Don't forget to talk to your family doctor about getting a bone density test to see how strong your bones are. Keep taking the calcium and vitamin D supplements and getting plenty of calcium in your diet.   -Call your stomach (GI) doctor's office about your concerns with swallowing and nausea.    -We will work on getting your mammogram scheduled sometime within the next 3-4 weeks at the [facility].   -We will see you in January 2019!  Please feel free to call us with any questions or concerns before your next visit here at the cancer center!  Happy New Year!  Mike Craze, NP [facility] [phone]

## 2016-11-10 ENCOUNTER — Encounter: Payer: Self-pay | Admitting: Adult Health

## 2016-11-10 ENCOUNTER — Ambulatory Visit: Payer: PPO | Attending: Family | Admitting: Occupational Therapy

## 2016-11-10 DIAGNOSIS — M25642 Stiffness of left hand, not elsewhere classified: Secondary | ICD-10-CM | POA: Diagnosis not present

## 2016-11-10 DIAGNOSIS — M79645 Pain in left finger(s): Secondary | ICD-10-CM | POA: Diagnosis not present

## 2016-11-10 NOTE — Patient Instructions (Signed)
  DO BELOW EXERCISES WITH SPLINT ON!!  AROM: PIP Flexion / Extension    Pinch bottom knuckle of ___small_____ finger of right hand to prevent bending. Actively bend middle knuckle until stretch is felt. Hold __5__ seconds. Relax. Straighten finger as far as possible. Repeat __10-15__ times per set.  Do __6__ sessions per day.   MP Flexion (Active)    With back of hand on table, bend large knuckles as far as they will go, keeping small joints of fingers straight. Repeat _10-15___ times. Do __6__ sessions per day.

## 2016-11-10 NOTE — Therapy (Signed)
[facility] [address] [place], [place], [address] Phone: [phone]   Fax:  [phone]  Occupational Therapy Treatment  Patient Details  Name: Natalie West MRN: [id number] Date of Birth: 1931/10/22 Referring Provider: Dondra Prader, NP  Encounter Date: 11/10/2016      OT End of Session - 11/10/16 1256    Visit Number 2   Number of Visits 8   Date for OT Re-Evaluation 12/22/16   Authorization Type [employer] - G code required   Authorization - Visit Number 2   Authorization - Number of Visits 10   OT Start Time [id number]   OT Stop Time 1225   OT Time Calculation (min) 40 min   Activity Tolerance Patient tolerated treatment well      Past Medical History:  Diagnosis Date  . Anxiety   . Arthritis   . Breast cancer ([place]) 2006   right breast  . Chronic cough   . Chronic UTI   . Depression   . Dysrhythmia   . Esophageal reflux   . Esophageal stricture   . Family history of malignant neoplasm of gastrointestinal tract   . GERD (gastroesophageal reflux disease)   . Hearing loss   . Hemorrhoids    internal  . History of hiatal hernia   . Hypercholesterolemia   . Hypertension   . Numbness and tingling    feeet, occasionally  . Osteopenia   . Personal history of colonic polyps 02/09/2007   TUBULAR ADENOMA  . PONV (postoperative nausea and vomiting)    trouble opening mouth and jaw, jaw pops, trouble turning head  . Rotator cuff tear    RT  . Status post dilation of esophageal narrowing   . Umbilical hernia     Past Surgical History:  Procedure Laterality Date  . BREAST LUMPECTOMY  2006   right lumpectomy  . CATARACT EXTRACTION W/ INTRAOCULAR LENS IMPLANT Bilateral   . COLONOSCOPY    . CYSTOCELE REPAIR N/A 04/20/2016   Procedure: AUGMENTED ANTERIOR VAULT REPAIR, COLOPLAST DERMIS GRAFT Leiani Enright PLICATION SACROSPINOUS FIXATION;  Surgeon: Carolan Clines, MD;  Location: [facility];  Service: Urology;   Laterality: N/A;  . DILATION AND CURETTAGE OF UTERUS    . ESOPHAGOGASTRODUODENOSCOPY ENDOSCOPY    . PUBOVAGINAL SLING N/A 04/20/2016   Procedure: PUBO-VAGINAL [name];  Surgeon: Carolan Clines, MD;  Location: [facility];  Service: Urology;  Laterality: N/A;  . REVISION URINARY SLING N/A 06/12/2016   Procedure: INCISION OF URETHRAL SLING;  Surgeon: Carolan Clines, MD;  Location: [facility];  Service: Urology;  Laterality: N/A;  1 HOUR  . SHOULDER OPEN ROTATOR CUFF REPAIR Right 02/28/2013   Procedure: RIGHT ROTATOR CUFF REPAIR SHOULDER OPEN WITH GRAFT AND ANCHORS;  Surgeon: Tobi Bastos, MD;  Location: [facility];  Service: Orthopedics;  Laterality: Right;  . UMBILICAL HERNIA REPAIR      There were no vitals filed for this visit.      Subjective Assessment - 11/10/16 1204    Subjective  It's a lot better than it was   Currently in Pain? Yes   Pain Score 2    Pain Location --  small finger   Pain Orientation Left   Pain Descriptors / Indicators Sore   Pain Type Chronic pain   Pain Onset More than a month ago   Pain Frequency Intermittent   Aggravating Factors  P/ROM   Pain Relieving Factors Rest  [facility] OT Assessment - 11/10/16 0001      ROM / Strength   AROM / PROM / Strength AROM  Lt small DIP -25*                  OT Treatments/Exercises (OP) - 11/10/16 0001      Exercises   Exercises Hand     Hand Exercises   Other Hand Exercises Pt issued updated HEP to also include isolated MP flexion. Pt return demo, as well as isolated PIP flexion (both with DIP splint on to maintain DIP joint in ext)      Splinting   Splinting Pt arrived with pre-fab mallet finger splint on, but unable to get all the way on properly. It is helping with ROM in DIP extension, but today fabricated taco style splint for better fit and increased DIP extension. Reviewed wear and care and issued. Pt instructed to keep pre-fab mallet finger splint as additional splint prn in case  pt loses this one                OT Education - 11/10/16 1217    Education provided Yes   Education Details A/ROM HEP, review of splint wear and care with new splint   Person(s) Educated Patient   Methods Explanation;Demonstration   Comprehension Verbalized understanding;Returned demonstration             OT Long Term Goals - 11/10/16 1256      OT LONG TERM GOAL #1   Title Pt independent with splint wear and care   Time 8   Period Weeks   Status On-going     OT LONG TERM GOAL #2   Title Pt to report pain 5/10 or under with P/ROM in DIP extension   Time 8   Period Weeks   Status Achieved     OT LONG TERM GOAL #3   Title Pt to achieve no more than a 20 degree extension lag Lt small DIP joint   Baseline -45*   Time 8   Period Weeks   Status Revised  11/10/16: -25*               Plan - 11/10/16 1257    Clinical Impression Statement Pt has improved with Lt small finger DIP joint ROM in extension (eval = -45*, today = -25*). Pt progressing per protocol and decreased pain at DIP joint.   Rehab Potential Fair   Clinical Impairments Affecting Rehab Potential time since onset   OT Frequency 2x / week   OT Duration 8 weeks   OT Treatment/Interventions Self-care/ADL training;Moist Heat;Fluidtherapy;DME and/or AE instruction;Splinting;Patient/family education;Compression bandaging;Therapeutic exercises;Therapeutic activities;Passive range of motion;Cryotherapy;Parrafin;Manual Therapy   Plan Pt to continue wearing DIP extension splint at all times except hygiene care, pt to return 12/03/16 after pt has been immobolized for 6 weeks to begin A/ROM DIP joint.    Consulted and Agree with Plan of Care Patient      Patient will benefit from skilled therapeutic intervention in order to improve the following deficits and impairments:  Decreased range of motion, Increased edema, Decreased knowledge of precautions, Impaired UE functional use, Pain, Decreased strength  Visit  Diagnosis: Stiffness of left hand, not elsewhere classified  Pain in left finger(s)    Problem List Patient Active Problem List   Diagnosis Date Noted  . Mallet finger of left finger(s) 10/08/2016  . Recurrent urinary tract infection 01/20/2015  . Cystocele 01/20/2015  . Constipation 08/22/2014  . Rotator cuff (  capsule) sprain 02/28/2013  . Breast cancer of lower-outer quadrant of right female breast ([place])   . Internal hemorrhoids 06/28/2011  . Gastroenteritis 06/28/2011  . HYPERLIPIDEMIA 02/18/2008  . GERD 02/18/2008  . HIATAL HERNIA 02/18/2008  . OSTEOARTHRITIS 02/18/2008  . COLONIC POLYPS, ADENOMATOUS 02/09/2007  . DIVERTICULITIS, ACUTE 02/09/2007  . ESOPHAGEAL STRICTURE 11/19/1998    Carey Bullocks, OTR/L  11/10/2016, 1:01 PM  [facility] [address] [place], [place], [address] Phone: [phone]   Fax:  [phone]  Name: Natalie West MRN: [id number] Date of Birth: 07-28-1931

## 2016-11-17 ENCOUNTER — Encounter: Payer: Self-pay | Admitting: Nurse Practitioner

## 2016-12-01 ENCOUNTER — Ambulatory Visit: Payer: PPO | Admitting: Occupational Therapy

## 2016-12-01 DIAGNOSIS — M25642 Stiffness of left hand, not elsewhere classified: Secondary | ICD-10-CM

## 2016-12-01 DIAGNOSIS — M79645 Pain in left finger(s): Secondary | ICD-10-CM

## 2016-12-01 NOTE — Patient Instructions (Addendum)
DIP Flexion (Active Blocked)    Hold __small____ finger firmly at the middle so that ONLY the tip joint can bend. Hold _5___ seconds, then fully straighten tip joint. Repeat _15___ times. Do _4___ sessions per day.  AROM: Finger Flexion / Extension    Actively bend fingers of right hand. Start with knuckles furthest from palm, and slowly make a fist. Hold _5___ seconds. Relax. Then straighten fingers as far as possible. Repeat __15__ times per set.  Do __4__ sessions per day.   CONTINUE TO WEAR FINGER SPLINT AT NIGHT

## 2016-12-01 NOTE — Therapy (Signed)
[facility] [address] [place], [place], [address] Phone: [phone]   Fax:  [phone]  Occupational Therapy Treatment  Patient Details  Name: Natalie West MRN: [id number] Date of Birth: [birth year] Referring Provider: Dondra Prader, NP  Encounter Date: 12/01/2016      OT End of Session - 12/01/16 1355    Visit Number 3   Number of Visits 8   Date for OT Re-Evaluation 12/22/16   Authorization Type [employer] - G code required   Authorization - Visit Number 3   Authorization - Number of Visits 10   OT Start Time 1315   OT Stop Time 1345   OT Time Calculation (min) 30 min   Activity Tolerance Patient tolerated treatment well      Past Medical History:  Diagnosis Date  . Anxiety   . Arthritis   . Breast cancer ([place]) 2006   right breast  . Chronic cough   . Chronic UTI   . Depression   . Dysrhythmia   . Esophageal reflux   . Esophageal stricture   . Family history of malignant neoplasm of gastrointestinal tract   . GERD (gastroesophageal reflux disease)   . Hearing loss   . Hemorrhoids    internal  . History of hiatal hernia   . Hypercholesterolemia   . Hypertension   . Numbness and tingling    feeet, occasionally  . Osteopenia   . Personal history of colonic polyps 02/09/2007   TUBULAR ADENOMA  . PONV (postoperative nausea and vomiting)    trouble opening mouth and jaw, jaw pops, trouble turning head  . Rotator cuff tear    RT  . Status post dilation of esophageal narrowing   . Umbilical hernia     Past Surgical History:  Procedure Laterality Date  . BREAST LUMPECTOMY  2006   right lumpectomy  . CATARACT EXTRACTION W/ INTRAOCULAR LENS IMPLANT Bilateral   . COLONOSCOPY    . CYSTOCELE REPAIR N/A 04/20/2016   Procedure: AUGMENTED ANTERIOR VAULT REPAIR, COLOPLAST DERMIS GRAFT Natalie West PLICATION SACROSPINOUS FIXATION;  Surgeon: Carolan Clines, MD;  [facility];  Service: Urology;   Laterality: N/A;  . DILATION AND CURETTAGE OF UTERUS    . ESOPHAGOGASTRODUODENOSCOPY ENDOSCOPY    . PUBOVAGINAL SLING N/A 04/20/2016   Procedure: PUBO-VAGINAL Natalie West;  Surgeon: Carolan Clines, MD;  [facility]: [facility];  Service: Urology;  Laterality: N/A;  . REVISION URINARY SLING N/A 06/12/2016   Procedure: INCISION OF URETHRAL SLING;  Surgeon: Carolan Clines, MD;  Location[facility];  Service: Urology;  Laterality: N/A;  1 HOUR  . SHOULDER OPEN ROTATOR CUFF REPAIR Right 02/28/2013   Procedure: RIGHT ROTATOR CUFF REPAIR SHOULDER OPEN WITH GRAFT AND ANCHORS;  Surgeon: Tobi Bastos, MD;  Location[facility];  Service: Orthopedics;  Laterality: Right;  . UMBILICAL HERNIA REPAIR      There were no vitals filed for this visit.      Subjective Assessment - 12/01/16 1352    Subjective  It's a lot better than it was   Patient Stated Goals Get my finger better   Currently in Pain? Yes   Pain Score 5    Pain Location --  small finger   Pain Orientation Left   Pain Descriptors / Indicators Sore   Pain Type Chronic pain   Pain Onset More than a month ago   Pain Frequency Intermittent   Aggravating Factors  A/ROM and P/ROM   Pain Relieving Factors  rest            Natalie West OT Assessment - 2016/12/21 0001      Left Hand AROM   L Little DIP 0-70 55 Degrees  -15 extension lag                  OT Treatments/Exercises (OP) - 21-Dec-2016 0001      Hand Exercises   Other Hand Exercises Pt showed DIP flex/ext blocking ex's and required max v.c's and repetition to perform correctly. Pt also shown full composite flex/ext ex and return demo. Pt instructed to wear mallet finger splint only at night for another month and can now take off during the day. Pt verbalized understanding                OT Education - 2016-12-21 1349    Education provided Yes   Education Details A/ROM HEP for DIP flex/ext, full composite flexion   Person(s) Educated Patient   Methods  Explanation;Verbal cues;Handout   Comprehension Verbalized understanding;Verbal cues required;Returned demonstration             OT Long Term Goals - 2016/12/21 1355      OT LONG TERM GOAL #1   Title Pt independent with splint wear and care   Time 8   Period Weeks   Status Achieved     OT LONG TERM GOAL #2   Title Pt to report pain 5/10 or under with P/ROM in DIP extension   Time 8   Period Weeks   Status Achieved     OT LONG TERM GOAL #3   Title Pt to achieve no more than a 20 degree extension lag Lt small DIP joint   Baseline -45*   Time 8   Period Weeks   Status Achieved  11/10/16: -25*, Dec 21, 2016: -15*               Plan - 12/21/16 1356    Clinical Impression Statement Pt met all LTG's. DIP motion Lt small finger greatly improved (flex = 55, ext = -15*).    Plan D/C O.T.    Consulted and Agree with Plan of Care Patient      Patient will benefit from skilled therapeutic intervention in order to improve the following deficits and impairments:     Visit Diagnosis: Stiffness of left hand, not elsewhere classified  Pain in left finger(s)      G-Codes - 12/21/2016 1357    Functional Assessment Tool Used clinical judgment/observation re: DIP joint Lt small finger   Functional Limitation Other OT primary   Other OT Primary Discharge Status (P7106) At least 1 percent but less than 20 percent impaired, limited or restricted   Other OT Secondary Goal Status (Y6948) At least 20 percent but less than 40 percent impaired, limited or restricted      Problem List Patient Active Problem List   Diagnosis Date Noted  . Mallet finger of left finger(s) 10/08/2016  . Recurrent urinary tract infection 01/20/2015  . Cystocele 01/20/2015  . Constipation 08/22/2014  . Rotator cuff (capsule) sprain 02/28/2013  . Breast cancer of lower-outer quadrant of right female breast ([place])   . Internal hemorrhoids 06/28/2011  . Gastroenteritis 06/28/2011  . HYPERLIPIDEMIA 02/18/2008   . GERD 02/18/2008  . HIATAL HERNIA 02/18/2008  . OSTEOARTHRITIS 02/18/2008  . COLONIC POLYPS, ADENOMATOUS 02/09/2007  . DIVERTICULITIS, ACUTE 02/09/2007  . ESOPHAGEAL STRICTURE 11/19/1998    OCCUPATIONAL THERAPY DISCHARGE SUMMARY  Visits from Start of Care: 3  Current functional level related to goals / functional outcomes: SEE ABOVE - Pt met all LTG's   Remaining deficits: Pain at Lt small DIP joint Mild extensor lag   Education / Equipment: HEP's, splint wear and care  Plan: Patient agrees to discharge.  Patient goals were met. Patient is being discharged due to meeting the stated rehab goals.  ?????       Carey Bullocks, OTR/L 12/01/2016, 1:58 PM  [facility] [address] [place], [place], [address] Phone: [phone]   Fax:  [phone]  Name: Natalie West MRN: [id number] Date of Birth: 11/13/1930

## 2016-12-02 ENCOUNTER — Ambulatory Visit
Admission: RE | Admit: 2016-12-02 | Discharge: 2016-12-02 | Disposition: A | Discharge status: Home / Self Care | Payer: PPO | Source: Ambulatory Visit | Attending: Adult Health | Admitting: Adult Health

## 2016-12-02 DIAGNOSIS — Z1231 Encounter for screening mammogram for malignant neoplasm of breast: Secondary | ICD-10-CM | POA: Diagnosis not present

## 2016-12-03 ENCOUNTER — Ambulatory Visit: Payer: PPO | Admitting: Occupational Therapy

## 2016-12-14 ENCOUNTER — Ambulatory Visit (INDEPENDENT_AMBULATORY_CARE_PROVIDER_SITE_OTHER): Payer: PPO | Admitting: Gastroenterology

## 2016-12-14 ENCOUNTER — Encounter: Payer: Self-pay | Admitting: Gastroenterology

## 2016-12-14 VITALS — BP 90/60 | HR 81 | Ht 62.0 in | Wt 151.0 lb

## 2016-12-14 DIAGNOSIS — K5901 Slow transit constipation: Secondary | ICD-10-CM

## 2016-12-14 DIAGNOSIS — K625 Hemorrhage of anus and rectum: Secondary | ICD-10-CM

## 2016-12-14 DIAGNOSIS — R110 Nausea: Secondary | ICD-10-CM | POA: Diagnosis not present

## 2016-12-14 DIAGNOSIS — R1310 Dysphagia, unspecified: Secondary | ICD-10-CM

## 2016-12-14 DIAGNOSIS — R1319 Other dysphagia: Secondary | ICD-10-CM

## 2016-12-14 DIAGNOSIS — R1033 Periumbilical pain: Secondary | ICD-10-CM

## 2016-12-14 NOTE — Progress Notes (Signed)
[facility] Progress Note  Chief Complaint: Abdominal pain  Subjective  History:  This is an 83 year old woman last seen in the clinic by Dr. Deatra Ina in October 2015 for constipation. She is a rather vague historian, with multiple symptoms today. She reports "upset stomach" and nausea after eating. This seems to occur frequently, and perhaps every day. Almost at the end of the visit in passing she notes that she also has dysphagia at times. Solid food or pills will feel stuck in the upper chest. She denies weight loss. She was concerned because about 2 weeks ago she had an acute episode of periumbilical to bandlike lower abdominal pain followed by passage of a large bowel movement and some self-limited rectal bleeding. She previously taken MiraLAX daily, but then gradually got away from that. She went back to using it daily after this recent episode. John had a normal colonoscopy by Dr. Deatra Ina in 2013. She seems to had upper endoscopy with esophageal stricture dilation multiple times, the most recent being in 2000.  ROS: Cardiovascular:  no chest pain Respiratory: She feels short of breath some of the time, sometimes at rest sometimes with bending over  The patient's Past Medical, Family and Social History were reviewed and are on file in the EMR.  Objective:  Med list reviewed. Of note, she is not taking hydromorphone even though it is listed on her chart. She apparently has it left over from previous surgery.  Vital signs in last 24 hrs: Vitals:   12/14/16 1134  BP: 90/60  Pulse: 81    Physical Exam Exam chaperoned by MA Magda Paganini   HEENT: sclera anicteric, oral mucosa moist without lesions  Neck: supple, no thyromegaly, JVD or lymphadenopathy  Cardiac: RRR without murmurs, S1S2 heard, no peripheral edema  Pulm: clear to auscultation bilaterally, normal RR and effort noted  Abdomen: soft, No tenderness, with active bowel sounds. No guarding or palpable  hepatosplenomegaly.  Skin; warm and dry, no jaundice or rash Rectal: Normal external exam, decreased resting and voluntary sphincter tone, no palpable internal lesions or blood: No fissure. small amount of soft stool.  Recent Labs:  None recent    @ASSESSMENTPLANBEGIN @ Assessment: Encounter Diagnoses  Name Primary?  . Esophageal dysphagia Yes  . Nausea without vomiting   . Slow transit constipation   . Rectal bleeding   . Periumbilical pain     Symptoms are somewhat vague and difficult to characterize. I think the recent episode was related to constipation which then resolved after a large BM. She seems to have benign anorectal bleeding, with nothing found on exam today. He has not had further bleeding since that episode 2 weeks ago. Her more chronic symptoms of dysphagia and "upset stomach" are also somewhat difficult to characterize. She reportedly had a stricture in the past. Differential for her dyspepsia is quite broad from medication side effect to gastritis to ulcer to (less likely) malignancy.  Plan:  Upper endoscopy with possible dilation Continue daily MiraLAX bowel regimen  Total time 30 minutes, over half spent in counseling and coordination of care.   Nelida Meuse III

## 2016-12-14 NOTE — Patient Instructions (Signed)
If you are age 81 or older, your body mass index should be between 23-30. Your Body mass index is 27.62 kg/m. If this is out of the aforementioned range listed, please consider follow up with your Primary Care Provider.  If you are age 106 or younger, your body mass index should be between 19-25. Your Body mass index is 27.62 kg/m. If this is out of the aformentioned range listed, please consider follow up with your Primary Care Provider.   You have been scheduled for an endoscopy. Please follow written instructions given to you at your visit today. If you use inhalers (even only as needed), please bring them with you on the day of your procedure. Your physician has requested that you go to www.startemmi.com and enter the access code given to you at your visit today. This web site gives a general overview about your procedure. However, you should still follow specific instructions given to you by our office regarding your preparation for the procedure.  Thank you for choosing [facility]  Dr Wilfrid Lund III

## 2016-12-15 DIAGNOSIS — R3914 Feeling of incomplete bladder emptying: Secondary | ICD-10-CM | POA: Diagnosis not present

## 2016-12-16 ENCOUNTER — Other Ambulatory Visit: Payer: Self-pay

## 2016-12-16 DIAGNOSIS — R1310 Dysphagia, unspecified: Secondary | ICD-10-CM

## 2016-12-16 DIAGNOSIS — R110 Nausea: Secondary | ICD-10-CM

## 2016-12-17 ENCOUNTER — Encounter: Payer: Self-pay | Admitting: Gastroenterology

## 2016-12-23 DIAGNOSIS — H40013 Open angle with borderline findings, low risk, bilateral: Secondary | ICD-10-CM | POA: Diagnosis not present

## 2016-12-30 ENCOUNTER — Encounter: Payer: Self-pay | Admitting: Gastroenterology

## 2016-12-30 ENCOUNTER — Ambulatory Visit (AMBULATORY_SURGERY_CENTER): Payer: PPO | Admitting: Gastroenterology

## 2016-12-30 VITALS — BP 139/95 | HR 68 | Temp 97.3°F | Resp 14 | Ht 62.0 in | Wt 151.0 lb

## 2016-12-30 DIAGNOSIS — R1310 Dysphagia, unspecified: Secondary | ICD-10-CM

## 2016-12-30 DIAGNOSIS — I10 Essential (primary) hypertension: Secondary | ICD-10-CM | POA: Diagnosis not present

## 2016-12-30 DIAGNOSIS — R110 Nausea: Secondary | ICD-10-CM | POA: Diagnosis not present

## 2016-12-30 MED ORDER — SODIUM CHLORIDE 0.9 % IV SOLN: 500.0000 mL | INTRAVENOUS | Status: DC

## 2016-12-30 NOTE — Progress Notes (Signed)
Patient came to recover post EGD. Pt. Sleeping. Upon arousing patient, patient starting coughing and her face became reddish purple. Reapplied O2 at 4l/min., repositioned patient on back and raised HOB, suctioned oral secretions via Francesca Oman, anesthesia notified and is here to assist with patient. Patient's normal color returned and has discontinued coughing episode. Will continue to monitor closely and have suction at stand-by in recover.

## 2016-12-30 NOTE — Progress Notes (Signed)
Pt's states no medical or surgical changes since previsit or office visit. 

## 2016-12-30 NOTE — Op Note (Signed)
[facility] Patient Name: Natalie West Procedure Date: 12/30/2016 9:42 AM MRN: [id number] Endoscopist: Mallie Mussel L. Loletha Carrow , MD Age: 83 Referring MD:  Date of Birth: Jun 10, 1931 Gender: Female Account #: [id number] Procedure:                Upper GI endoscopy Indications:              Dysphagia Medicines:                Monitored Anesthesia Care Procedure:                Pre-Anesthesia Assessment:                           - Prior to the procedure, a History and Physical                            was performed, and patient medications and                            allergies were reviewed. The patient's tolerance of                            previous anesthesia was also reviewed. The risks                            and benefits of the procedure and the sedation                            options and risks were discussed with the patient.                            All questions were answered, and informed consent                            was obtained. Prior Anticoagulants: The patient has                            taken no previous anticoagulant or antiplatelet                            agents. ASA Grade Assessment: III - A patient with                            severe systemic disease. After reviewing the risks                            and benefits, the patient was deemed in                            satisfactory condition to undergo the procedure.                           After obtaining informed consent, the endoscope was  passed under direct vision. Throughout the                            procedure, the patient's blood pressure, pulse, and                            oxygen saturations were monitored continuously. The                            Endoscope was introduced through the mouth, and                            advanced to the second part of duodenum. The upper                            GI endoscopy was accomplished without  difficulty.                            The patient tolerated the procedure well. Scope In: Scope Out: Findings:                 The examined esophagus was moderately tortuous. No                            stricture or mass was seen.                           A medium-sized hiatal hernia was present.                           The stomach was normal.                           The cardia and gastric fundus were normal on                            retroflexion.                           The examined duodenum was normal. Complications:            No immediate complications. Estimated Blood Loss:     Estimated blood loss: none. Impression:               - Tortuous esophagus.                           - Normal stomach.                           - Normal examined duodenum.                           - No specimens collected.                           Dysphagia is mechanical (tortuous esophagus) and  also likely dysmotility. Recommendation:           - Patient has a contact number available for                            emergencies. The signs and symptoms of potential                            delayed complications were discussed with the                            patient. Return to normal activities tomorrow.                            Written discharge instructions were provided to the                            patient.                           - Resume previous diet.                           - Continue present medications. Henry L. Loletha Carrow, MD 12/30/2016 10:13:52 AM This report has been signed electronically.

## 2016-12-30 NOTE — Progress Notes (Signed)
Patient now fully alert, normal skin color, pulse ox 96% on room air, family at bedside, other VSS. Patient without complaints at this time.

## 2016-12-30 NOTE — Progress Notes (Signed)
To recovery, report to Hines, RN, VSS 

## 2016-12-30 NOTE — Patient Instructions (Signed)
YOU HAD AN ENDOSCOPIC PROCEDURE TODAY AT THE [facility]:   Refer to the procedure report that was given to you for any specific questions about what was found during the examination.  If the procedure report does not answer your questions, please call your gastroenterologist to clarify.  If you requested that your care partner not be given the details of your procedure findings, then the procedure report has been included in a sealed envelope for you to review at your convenience later.  YOU SHOULD EXPECT: Some feelings of bloating in the abdomen. Passage of more gas than usual.  Walking can help get rid of the air that was put into your GI tract during the procedure and reduce the bloating. If you had a lower endoscopy (such as a colonoscopy or flexible sigmoidoscopy) you may notice spotting of blood in your stool or on the toilet paper. If you underwent a bowel prep for your procedure, you may not have a normal bowel movement for a few days.  Please Note:  You might notice some irritation and congestion in your nose or some drainage.  This is from the oxygen used during your procedure.  There is no need for concern and it should clear up in a day or so.  SYMPTOMS TO REPORT IMMEDIATELY:    Following upper endoscopy (EGD)  Vomiting of blood or coffee ground material  New chest pain or pain under the shoulder blades  Painful or persistently difficult swallowing  New shortness of breath  Fever of 100F or higher  Black, tarry-looking stools  For urgent or emergent issues, a gastroenterologist can be reached at any hour by calling [phone].   DIET:  We do recommend a small meal at first, but then you may proceed to your regular diet.  Drink plenty of fluids but you should avoid alcoholic beverages for 24 hours.  MEDICATIONS: Continue present medications.  ACTIVITY:  You should plan to take it easy for the rest of today and you should NOT DRIVE or use heavy machinery until  tomorrow (because of the sedation medicines used during the test).    FOLLOW UP: Our staff will call the number listed on your records the next business day following your procedure to check on you and address any questions or concerns that you may have regarding the information given to you following your procedure. If we do not reach you, we will leave a message.  However, if you are feeling well and you are not experiencing any problems, there is no need to return our call.  We will assume that you have returned to your regular daily activities without incident.  If any biopsies were taken you will be contacted by phone or by letter within the next 1-3 weeks.  Please call us at [phone] if you have not heard about the biopsies in 3 weeks.   Thank you for allowing us to provide for your healthcare needs today.   SIGNATURES/CONFIDENTIALITY: You and/or your care partner have signed paperwork which will be entered into your electronic medical record.  These signatures attest to the fact that that the information above on your After Visit Summary has been reviewed and is understood.  Full responsibility of the confidentiality of this discharge information lies with you and/or your care-partner. 

## 2016-12-31 ENCOUNTER — Telehealth: Payer: Self-pay | Admitting: *Deleted

## 2016-12-31 NOTE — Telephone Encounter (Signed)
  Follow up Call-  Call back number 12/30/2016  Post procedure Call Back phone  # [phone]  Permission to leave phone message No  Some recent data might be hidden     Patient questions:  Do you have a fever, pain , or abdominal swelling? No. Pain Score  0 *  Have you tolerated food without any problems? Yes.    Have you been able to return to your normal activities? Yes.    Do you have any questions about your discharge instructions: Diet   No. Medications  No. Follow up visit  No.  Do you have questions or concerns about your Care? No.  Actions: * If pain score is 4 or above: No action needed, pain <4.

## 2017-01-14 DIAGNOSIS — L03031 Cellulitis of right toe: Secondary | ICD-10-CM | POA: Diagnosis not present

## 2017-01-15 DIAGNOSIS — K219 Gastro-esophageal reflux disease without esophagitis: Secondary | ICD-10-CM | POA: Diagnosis not present

## 2017-01-15 DIAGNOSIS — I10 Essential (primary) hypertension: Secondary | ICD-10-CM | POA: Diagnosis not present

## 2017-01-15 DIAGNOSIS — E7800 Pure hypercholesterolemia, unspecified: Secondary | ICD-10-CM | POA: Diagnosis not present

## 2017-01-15 DIAGNOSIS — Z23 Encounter for immunization: Secondary | ICD-10-CM | POA: Diagnosis not present

## 2017-01-15 DIAGNOSIS — F329 Major depressive disorder, single episode, unspecified: Secondary | ICD-10-CM | POA: Diagnosis not present

## 2017-02-12 ENCOUNTER — Other Ambulatory Visit: Payer: Self-pay

## 2017-02-12 NOTE — Patient Outreach (Signed)
[facility]) Care Management  02/12/2017  Natalie West Apr 24, 1931 [id number]   Telephone Screen  Referral Date: 02/10/17  Referral Source: EMMI Prevent Referral Reason: "HTN, A-Fib"    Outreach attempt #1  to patient. Number listed is currently not in service. No alternate numbers to attempt at this time.     Plan: RN CM will make outreach attempt to patient within three business days.   Enzo Montgomery, RN,BSN,CCM [place] Management Telephonic Care Management Coordinator Direct Phone: [phone] Toll Free: [phone] Fax: [phone]

## 2017-02-15 ENCOUNTER — Other Ambulatory Visit: Payer: Self-pay

## 2017-02-15 NOTE — Patient Outreach (Signed)
[place] [facility]) Care Management  02/15/2017  MARIEANN ZIPP 21-Apr-1931 [id number]   Telephone Screen  Referral Date: 02/10/17  Referral Source: EMMI Prevent Referral Reason: "HTN, A-Fib"    Outreach attempt #2 to patient. Spoke with patient and screening completed.  Social: Patient lives in her home alone. She states that behind her house is a trailer in which her disabled son who has schizophrenia and blindness lives. She reports that she assists him with his care needs as well. Patient is independent with ADLs/IADLs. She drives herself to medical appts. She reports last fall was a few months ago and no injury sustained. She denies any DME in the home.    Conditions: Patient has PMH of Afib, right breast lumpectomy(2006), hearing loss, GERD, anxiety, depression, HLD and HTN. She reports that she also has neuropathy in her feet and was recently told she had a hiatal hernia.   Medications: She reports she is taking seven meds. Denies any issues with affording and/or managing meds.   Appointments: Patient saw PCP on 01/14/17.    Advance Directives: Patient states she has  HCPOA.  Consent: [facility] services reviewed and discussed. Patient voices no RN CM needs or concerns. She is agreeable to [employer].  Plan: RN CM will notify [facility] administrative assistant of case status. RN CM will send [place] referral for further disease education and support.   Enzo Montgomery, RN,BSN,CCM [place] Management Telephonic Care Management Coordinator Direct Phone: [phone] Toll Free: [phone] Fax: [phone]

## 2017-02-16 ENCOUNTER — Ambulatory Visit: Payer: Self-pay

## 2017-02-22 ENCOUNTER — Other Ambulatory Visit: Payer: Self-pay

## 2017-02-22 NOTE — Patient Outreach (Signed)
[facility]) Care Management  02/22/2017  Natalie West [birth year] [id number]   Telephone call to patient for initial assessment attempt.  Patient reports that she has a funeral to go to today and now is not a good time.  Advised patient that I would call back another time. She verbalized understanding.  Plan: RN Health Coach will attempt patient again within 10 business days.    [name], RN, MSN [place] [phone]

## 2017-03-03 DIAGNOSIS — N393 Stress incontinence (female) (male): Secondary | ICD-10-CM | POA: Diagnosis not present

## 2017-03-03 DIAGNOSIS — R3914 Feeling of incomplete bladder emptying: Secondary | ICD-10-CM | POA: Diagnosis not present

## 2017-03-04 ENCOUNTER — Other Ambulatory Visit: Payer: Self-pay

## 2017-03-04 NOTE — Patient Outreach (Signed)
[facility]) Care Management  03/04/2017  Natalie West [birth year] [id number]   2nd telephone call to patient for initial assessment .  No answer.  Unable to leave a message.    Plan: RN Health Coach will attempt patient again within 10 business days.  [name], RN, MSN [place] [phone]

## 2017-03-08 ENCOUNTER — Other Ambulatory Visit: Payer: Self-pay

## 2017-03-08 NOTE — Patient Outreach (Signed)
[place] [facility]) Care Management  03/08/2017  FLORETTA PETRO Aug 25, 1931 [id number]   3rd telephone call to patient for initial assessment.  No answer.  Unable to leave a message.  Plan: RN Health Coach will send letter to attempt outreach.  If no response within 10 business days,will proceed with case closure.    [name], RN, MSN [place] [phone]

## 2017-03-25 ENCOUNTER — Other Ambulatory Visit: Payer: Self-pay

## 2017-03-25 NOTE — Patient Outreach (Signed)
[facility]) Care Management  03/25/2017  Natalie West 04-Jul-1931 [id number]   Patient has not responded to calls or letter.  RN CM will proceed with case closure.  RN CM will notify care management assistant of case closure.  [name], RN, MSN [place] [phone]

## 2017-05-26 DIAGNOSIS — H532 Diplopia: Secondary | ICD-10-CM | POA: Diagnosis not present

## 2017-05-31 ENCOUNTER — Other Ambulatory Visit: Payer: Self-pay | Admitting: Family Medicine

## 2017-05-31 DIAGNOSIS — M810 Age-related osteoporosis without current pathological fracture: Secondary | ICD-10-CM

## 2017-06-03 ENCOUNTER — Ambulatory Visit
Admission: RE | Admit: 2017-06-03 | Discharge: 2017-06-03 | Disposition: A | Discharge status: Home / Self Care | Payer: PPO | Source: Ambulatory Visit | Attending: Family Medicine | Admitting: Family Medicine

## 2017-06-03 DIAGNOSIS — Z780 Asymptomatic menopausal state: Secondary | ICD-10-CM | POA: Diagnosis not present

## 2017-06-03 DIAGNOSIS — M810 Age-related osteoporosis without current pathological fracture: Secondary | ICD-10-CM

## 2017-06-03 DIAGNOSIS — M85832 Other specified disorders of bone density and structure, left forearm: Secondary | ICD-10-CM | POA: Diagnosis not present

## 2017-06-14 DIAGNOSIS — J069 Acute upper respiratory infection, unspecified: Secondary | ICD-10-CM | POA: Diagnosis not present

## 2017-06-16 DIAGNOSIS — F418 Other specified anxiety disorders: Secondary | ICD-10-CM | POA: Diagnosis not present

## 2017-06-16 DIAGNOSIS — R002 Palpitations: Secondary | ICD-10-CM | POA: Diagnosis not present

## 2017-06-16 DIAGNOSIS — I10 Essential (primary) hypertension: Secondary | ICD-10-CM | POA: Diagnosis not present

## 2017-06-16 DIAGNOSIS — I480 Paroxysmal atrial fibrillation: Secondary | ICD-10-CM | POA: Diagnosis not present

## 2017-09-16 DIAGNOSIS — D1801 Hemangioma of skin and subcutaneous tissue: Secondary | ICD-10-CM | POA: Diagnosis not present

## 2017-09-16 DIAGNOSIS — B351 Tinea unguium: Secondary | ICD-10-CM | POA: Diagnosis not present

## 2017-09-16 DIAGNOSIS — L853 Xerosis cutis: Secondary | ICD-10-CM | POA: Diagnosis not present

## 2017-09-16 DIAGNOSIS — L821 Other seborrheic keratosis: Secondary | ICD-10-CM | POA: Diagnosis not present

## 2017-09-16 DIAGNOSIS — L400 Psoriasis vulgaris: Secondary | ICD-10-CM | POA: Diagnosis not present

## 2017-09-16 DIAGNOSIS — L814 Other melanin hyperpigmentation: Secondary | ICD-10-CM | POA: Diagnosis not present

## 2017-09-16 DIAGNOSIS — L820 Inflamed seborrheic keratosis: Secondary | ICD-10-CM | POA: Diagnosis not present

## 2017-09-20 DIAGNOSIS — N393 Stress incontinence (female) (male): Secondary | ICD-10-CM | POA: Diagnosis not present

## 2017-09-20 DIAGNOSIS — R3914 Feeling of incomplete bladder emptying: Secondary | ICD-10-CM | POA: Diagnosis not present

## 2017-09-30 DIAGNOSIS — H40013 Open angle with borderline findings, low risk, bilateral: Secondary | ICD-10-CM | POA: Diagnosis not present

## 2017-09-30 DIAGNOSIS — H532 Diplopia: Secondary | ICD-10-CM | POA: Diagnosis not present

## 2017-09-30 DIAGNOSIS — H01009 Unspecified blepharitis unspecified eye, unspecified eyelid: Secondary | ICD-10-CM | POA: Diagnosis not present

## 2017-09-30 DIAGNOSIS — H04123 Dry eye syndrome of bilateral lacrimal glands: Secondary | ICD-10-CM | POA: Diagnosis not present

## 2017-10-07 DIAGNOSIS — M79602 Pain in left arm: Secondary | ICD-10-CM | POA: Diagnosis not present

## 2017-10-07 DIAGNOSIS — L821 Other seborrheic keratosis: Secondary | ICD-10-CM | POA: Diagnosis not present

## 2017-10-28 ENCOUNTER — Other Ambulatory Visit: Payer: Self-pay | Admitting: Family Medicine

## 2017-10-28 DIAGNOSIS — Z1231 Encounter for screening mammogram for malignant neoplasm of breast: Secondary | ICD-10-CM

## 2017-11-05 ENCOUNTER — Telehealth: Payer: Self-pay | Admitting: Adult Health

## 2017-11-05 ENCOUNTER — Encounter: Payer: Self-pay | Admitting: Adult Health

## 2017-11-05 ENCOUNTER — Ambulatory Visit (HOSPITAL_BASED_OUTPATIENT_CLINIC_OR_DEPARTMENT_OTHER): Payer: PPO | Admitting: Adult Health

## 2017-11-05 VITALS — BP 136/79 | HR 81 | Temp 97.7°F | Resp 18 | Ht 62.0 in | Wt 148.0 lb

## 2017-11-05 DIAGNOSIS — Z853 Personal history of malignant neoplasm of breast: Secondary | ICD-10-CM

## 2017-11-05 DIAGNOSIS — Z170 Estrogen receptor positive status [ER+]: Principal | ICD-10-CM

## 2017-11-05 DIAGNOSIS — M8580 Other specified disorders of bone density and structure, unspecified site: Secondary | ICD-10-CM | POA: Diagnosis not present

## 2017-11-05 DIAGNOSIS — C50511 Malignant neoplasm of lower-outer quadrant of right female breast: Secondary | ICD-10-CM

## 2017-11-05 NOTE — Pre-Procedure Instructions (Signed)
Leftbreast1/4/19

## 2017-11-05 NOTE — Telephone Encounter (Signed)
Gave patient AVS and calendar of upcoming January 2019 appointments. °

## 2017-11-05 NOTE — Progress Notes (Signed)
CLINIC:  Survivorship   REASON FOR VISIT:  Routine follow-up for history of breast cancer.   BRIEF ONCOLOGIC HISTORY:    Breast cancer of lower-outer quadrant of right female breast ([place])   11/10/2004 Surgery    Right breast lumpectomy invasive ductal carcinoma grade 1; 0.5 cm 2 sentinel lymph nodes negative ER 85% PR 0% Ki-67 19% HER-2 2+ by IHC and negative by fish, no lymphovascular invasion T1, N0, M0 stage IA      12/29/2004 - 01/26/2005 Radiation Therapy    Radiation therapy to lumpectomy site      02/26/2005 - 02/26/2010 Anti-estrogen oral therapy    Aromasin 25 mg by mouth daily: Side effects including depression.        INTERVAL HISTORY:  Ms. Natalie West presents to the Survivorship Clinic today for routine follow-up for her history of breast cancer.  Overall, she reports feeling quite well. She denies any issues today.  She did have some itching under her left breast that has resolved.  She sees her PCP regularly.      REVIEW OF SYSTEMS:  Review of Systems  Constitutional: Negative for appetite change, chills, fatigue, fever and unexpected weight change.  HENT:   Negative for hearing loss, lump/mass and trouble swallowing.   Eyes: Negative for eye problems and icterus.  Respiratory: Negative for chest tightness, cough and shortness of breath.   Cardiovascular: Negative for chest pain, leg swelling and palpitations.  Gastrointestinal: Negative for abdominal distention, abdominal pain, constipation, diarrhea, nausea and vomiting.  Endocrine: Negative for hot flashes.  Genitourinary: Negative for difficulty urinating.   Musculoskeletal: Negative for arthralgias.  Skin: Negative for itching and rash.  Neurological: Negative for dizziness, extremity weakness, headaches and numbness.  Hematological: Negative for adenopathy. Does not bruise/bleed easily.  Psychiatric/Behavioral: Negative for depression. The patient is not nervous/anxious.   Breast: Denies any new nodularity,  masses, tenderness, nipple changes, or nipple discharge.       PAST MEDICAL/SURGICAL HISTORY:  Past Medical History:  Diagnosis Date  . Anxiety   . Arthritis   . Breast cancer ([place]) 2006   right breast  . Chronic cough   . Chronic UTI   . Depression   . Dysrhythmia   . Esophageal reflux   . Esophageal stricture   . Family history of malignant neoplasm of gastrointestinal tract   . GERD (gastroesophageal reflux disease)   . Hearing loss   . Hemorrhoids    internal  . History of hiatal hernia   . Hypercholesterolemia   . Hypertension   . Numbness and tingling    feeet, occasionally  . Osteopenia   . Personal history of colonic polyps 02/09/2007   TUBULAR ADENOMA  . PONV (postoperative nausea and vomiting)    trouble opening mouth and jaw, jaw pops, trouble turning head  . Rotator cuff tear    RT  . Status post dilation of esophageal narrowing   . Umbilical hernia    Past Surgical History:  Procedure Laterality Date  . BREAST LUMPECTOMY  2006   right lumpectomy  . CATARACT EXTRACTION W/ INTRAOCULAR LENS IMPLANT Bilateral   . COLONOSCOPY    . CYSTOCELE REPAIR N/A 04/20/2016   Procedure: AUGMENTED ANTERIOR VAULT REPAIR, COLOPLAST DERMIS GRAFT KELLY PLICATION SACROSPINOUS FIXATION;  Surgeon: Carolan Clines, MD;  Location: [facility];  Service: Urology;  Laterality: N/A;  . DILATION AND CURETTAGE OF UTERUS    . ESOPHAGOGASTRODUODENOSCOPY ENDOSCOPY    . PUBOVAGINAL SLING N/A 04/20/2016   Procedure: PUBO-VAGINAL ALTIS  SLING;  Surgeon: Carolan Clines, MD;  Location: [facility];  Service: Urology;  Laterality: N/A;  . REVISION URINARY SLING N/A 06/12/2016   Procedure: INCISION OF URETHRAL SLING;  Surgeon: Carolan Clines, MD;  Location: [facility];  Service: Urology;  Laterality: N/A;  1 HOUR  . SHOULDER OPEN ROTATOR CUFF REPAIR Right 02/28/2013   Procedure: RIGHT ROTATOR CUFF REPAIR SHOULDER OPEN WITH GRAFT AND ANCHORS;  Surgeon: Tobi Bastos, MD;  Location: [facility];  Service:  Orthopedics;  Laterality: Right;  . UMBILICAL HERNIA REPAIR       ALLERGIES:  Allergies  Allergen Reactions  . Ciprofloxacin Other (See Comments)    Reaction unknown  . Clindamycin/Lincomycin Other (See Comments)    Reaction unknown   . Codeine Nausea And Vomiting  . Diclofenac Other (See Comments)    Reaction unknown   . Fenoprofen Calcium Other (See Comments)    Reaction unknown   . Flexeril [Cyclobenzaprine Hcl] Other (See Comments)    Reaction unknown   . Hydrocodone Other (See Comments)    "Last time it was too strong and messed up my mind"  . Macrobid [Nitrofurantoin] Other (See Comments)    Reaction unknown   . Noroxin [Norfloxacin] Other (See Comments)    Reaction unknown   . Relafen [Nabumetone] Other (See Comments)    Reaction unknown   . Sulfa Antibiotics Other (See Comments)    Reaction unknown      CURRENT MEDICATIONS:  Outpatient Encounter Medications as of 11/05/2017  Medication Sig Note  . acetaminophen (TYLENOL) 325 MG tablet Take 650 mg by mouth every 6 (six) hours as needed for moderate pain.   Marland Kitchen atenolol (TENORMIN) 25 MG tablet TAKE ONE TABLET DAILY TO PREVENT PALPITATIONS. 06/10/2016: Takes at hs  . bethanechol (URECHOLINE) 25 MG tablet Take 25 mg by mouth 2 (two) times daily.   . Calcium Carb-Cholecalciferol (CALCIUM 600 + D PO) Take 1 tablet by mouth daily.   . fluticasone (CUTIVATE) 0.005 % ointment Apply 1 application topically daily as needed (itching).   . hydrochlorothiazide (MICROZIDE) 12.5 MG capsule Take 12.5 mg by mouth daily.   . meclizine (ANTIVERT) 12.5 MG tablet Take 12.5 mg by mouth 2 (two) times daily.   . Multiple Vitamin (MULTIVITAMIN WITH MINERALS) TABS tablet Take 1 tablet by mouth daily.   Marland Kitchen omeprazole (PRILOSEC) 20 MG capsule Take 20 mg by mouth daily as needed (For heartburn or acid reflux.).    Marland Kitchen perphenazine-amitriptyline (ETRAFON/TRIAVIL) 2-10 MG TABS Take 1 tablet by mouth 2 (two) times daily.   . polyethylene glycol  powder (GLYCOLAX/MIRALAX) powder Take 17 g by mouth daily.   . pravastatin (PRAVACHOL) 20 MG tablet Take 20 mg by mouth at bedtime.    . [DISCONTINUED] HYDROmorphone (DILAUDID) 2 MG tablet Take 0.5 tablets (1 mg total) by mouth every 4 (four) hours as needed for moderate pain or severe pain. (Patient not taking: Reported on 11/06/2016)    Facility-Administered Encounter Medications as of 11/05/2017  Medication  . 0.9 %  sodium chloride infusion     ONCOLOGIC FAMILY HISTORY:  Family History  Problem Relation Age of Onset  . Colon cancer Mother 15  . Kidney cancer Mother   . Glaucoma Mother   . Heart disease Sister   . Stomach cancer Brother   . Stroke Sister   . Lung cancer Brother   . Clotting disorder Son   . Emphysema Father        smoker  . Hypertension Sister  SOCIAL HISTORY:  LAQUINTA HAZELL is widowed and lives alone in [place], [place].  She has one son who lives in a trailer behind her house.  Ms. Guthridge is currently retired.  She denies any current or history of tobacco, alcohol, or illicit drug use.     PHYSICAL EXAMINATION:  Vital Signs: Vitals:   11/05/17 1524  BP: 136/79  Pulse: 81  Resp: 18  Temp: 97.7 F (36.5 C)  SpO2: 97%   Filed Weights   11/05/17 1524  Weight: 148 lb (67.1 kg)   General: Well-nourished, well-appearing female in no acute distress.  Unaccompanied today.   HEENT: Head is normocephalic.  Pupils equal and reactive to light. Conjunctivae clear without exudate.  Sclerae anicteric. Oral mucosa is pink, moist.  Oropharynx is pink without lesions or erythema.  Lymph: No cervical, supraclavicular, or infraclavicular lymphadenopathy noted on palpation.  Cardiovascular: Regular rate and rhythm.Marland Kitchen Respiratory: Clear to auscultation bilaterally. Chest expansion symmetric; breathing non-labored.  Breast Exam:  -Left breast: No appreciable masses on palpation. No skin redness, thickening, or peau d'orange appearance; left nipple ?nodule,  see below ;-Right breast: No appreciable masses on palpation. No skin redness, thickening, or peau d'orange appearance; no nipple retraction or nipple discharge; mild distortion in symmetry at previous lumpectomy site well healed scar without erythema or nodularity. -Axilla: No axillary adenopathy bilaterally.  GI: Abdomen soft and round; non-tender, non-distended. Bowel sounds normoactive. No hepatosplenomegaly.   GU: Deferred.  Neuro: No focal deficits. Steady gait.  Psych: Mood and affect normal and appropriate for situation.  MSK: No focal spinal tenderness to palpation, full range of motion in bilateral upper extremities Extremities: No edema. Skin: Warm and dry. Nipple abnormality   LABORATORY DATA:  None for this visit   DIAGNOSTIC IMAGING:  Most recent mammogram:     ASSESSMENT AND PLAN:  Ms.. Lucking is a pleasant 84 y.o. female with history of Stage IA right breast invasive ductal carcinoma, ER+/PR-/HER2-, diagnosed in (date), treated with lumpectomy, adjuvant radiation therapy, and anti-estrogen therapy with Aromasin x 5 years completed in 01/2010.  She presents to the Survivorship Clinic for surveillance and routine follow-up.   1. History of breast cancer:  Ms. Natalie West is currently clinically and radiographically without evidence of disease or recurrence of breast cancer. She will be due for mammogram in 12/2017.  I encouraged her to call me with any questions or concerns before her next visit at the cancer center, and I would be happy to see her sooner, if needed.    2. Left nipple abnormality: Will refer to surgery for their opinion on this.  She tells me now that I show her the abnormality in the mirror that it is new.  She says that she used to have itching and discharge from that nipple previously.    3. Bone health:  Given Ms. Justiss age, history of breast cancer, and her previous anti-estrogen therapy with Aromasin, she is at risk for bone demineralization. Her last DEXA scan was  on 06/03/2017 and demonstrated osteopenia with a t score of -2.4 in the left forearm . Given that she is no longer on Aromasin, I will defer to her PCP regarding bone density testing and management.  She was given education on specific food and activities to promote bone health.  4. Cancer screening:  Due to Ms. Bratz's history and her age, she should receive screening for skin cancers. She was encouraged to follow-up with her PCP for appropriate cancer screenings.   5.  Health maintenance and wellness promotion: Ms. Edelen was encouraged to consume 5-7 servings of fruits and vegetables per day. She was also encouraged to engage in moderate to vigorous exercise for 30 minutes per day most days of the week. She was instructed to limit her alcohol consumption and continue to abstain from tobacco use.    Dispo:  -Return to cancer center in one year for lts follow up -mammogram in 12/2017 (or earlier per surgery) -Eval by surgery for left nipple abnormality   A total of (30) minutes of face-to-face time was spent with this patient with greater than 50% of that time in counseling and care-coordination.   Gardenia Phlegm, [place] [phone]   Note: PRIMARY CARE PROVIDER Christain Sacramento, [place] [phone]

## 2017-11-11 ENCOUNTER — Other Ambulatory Visit: Payer: Self-pay | Admitting: General Surgery

## 2017-11-11 DIAGNOSIS — N6320 Unspecified lump in the left breast, unspecified quadrant: Secondary | ICD-10-CM

## 2017-11-17 ENCOUNTER — Ambulatory Visit
Admission: RE | Admit: 2017-11-17 | Discharge: 2017-11-17 | Disposition: A | Discharge status: Home / Self Care | Payer: PPO | Source: Ambulatory Visit | Attending: General Surgery | Admitting: General Surgery

## 2017-11-17 ENCOUNTER — Other Ambulatory Visit: Payer: Self-pay | Admitting: General Surgery

## 2017-11-17 DIAGNOSIS — N6320 Unspecified lump in the left breast, unspecified quadrant: Secondary | ICD-10-CM

## 2017-11-17 DIAGNOSIS — N6489 Other specified disorders of breast: Secondary | ICD-10-CM | POA: Diagnosis not present

## 2017-11-17 DIAGNOSIS — R928 Other abnormal and inconclusive findings on diagnostic imaging of breast: Secondary | ICD-10-CM | POA: Diagnosis not present

## 2017-11-17 HISTORY — DX: Personal history of irradiation: Z92.3

## 2017-11-26 DIAGNOSIS — N649 Disorder of breast, unspecified: Secondary | ICD-10-CM | POA: Diagnosis not present

## 2017-12-03 ENCOUNTER — Ambulatory Visit
Admission: RE | Admit: 2017-12-03 | Discharge: 2017-12-03 | Disposition: A | Discharge status: Home / Self Care | Payer: PPO | Source: Ambulatory Visit | Attending: Family Medicine | Admitting: Family Medicine

## 2017-12-03 ENCOUNTER — Ambulatory Visit: Payer: Self-pay

## 2017-12-03 DIAGNOSIS — Z1231 Encounter for screening mammogram for malignant neoplasm of breast: Secondary | ICD-10-CM | POA: Diagnosis not present

## 2017-12-16 DIAGNOSIS — L84 Corns and callosities: Secondary | ICD-10-CM | POA: Diagnosis not present

## 2017-12-16 DIAGNOSIS — M79604 Pain in right leg: Secondary | ICD-10-CM | POA: Diagnosis not present

## 2018-01-26 DIAGNOSIS — E7800 Pure hypercholesterolemia, unspecified: Secondary | ICD-10-CM | POA: Diagnosis not present

## 2018-01-26 DIAGNOSIS — I10 Essential (primary) hypertension: Secondary | ICD-10-CM | POA: Diagnosis not present

## 2018-01-26 DIAGNOSIS — K219 Gastro-esophageal reflux disease without esophagitis: Secondary | ICD-10-CM | POA: Diagnosis not present

## 2018-01-26 DIAGNOSIS — Z0000 Encounter for general adult medical examination without abnormal findings: Secondary | ICD-10-CM | POA: Diagnosis not present

## 2018-01-26 DIAGNOSIS — L84 Corns and callosities: Secondary | ICD-10-CM | POA: Diagnosis not present

## 2018-03-02 DIAGNOSIS — L84 Corns and callosities: Secondary | ICD-10-CM | POA: Diagnosis not present

## 2018-03-02 DIAGNOSIS — M2041 Other hammer toe(s) (acquired), right foot: Secondary | ICD-10-CM | POA: Diagnosis not present

## 2018-03-02 DIAGNOSIS — M2042 Other hammer toe(s) (acquired), left foot: Secondary | ICD-10-CM | POA: Diagnosis not present

## 2018-03-07 DIAGNOSIS — R3914 Feeling of incomplete bladder emptying: Secondary | ICD-10-CM | POA: Diagnosis not present

## 2018-03-14 DIAGNOSIS — N3941 Urge incontinence: Secondary | ICD-10-CM | POA: Diagnosis not present

## 2018-03-14 DIAGNOSIS — M6281 Muscle weakness (generalized): Secondary | ICD-10-CM | POA: Diagnosis not present

## 2018-03-14 DIAGNOSIS — M62838 Other muscle spasm: Secondary | ICD-10-CM | POA: Diagnosis not present

## 2018-03-24 DIAGNOSIS — R3915 Urgency of urination: Secondary | ICD-10-CM | POA: Diagnosis not present

## 2018-03-24 DIAGNOSIS — M62838 Other muscle spasm: Secondary | ICD-10-CM | POA: Diagnosis not present

## 2018-03-24 DIAGNOSIS — N3941 Urge incontinence: Secondary | ICD-10-CM | POA: Diagnosis not present

## 2018-03-24 DIAGNOSIS — M6281 Muscle weakness (generalized): Secondary | ICD-10-CM | POA: Diagnosis not present

## 2018-04-07 DIAGNOSIS — H40013 Open angle with borderline findings, low risk, bilateral: Secondary | ICD-10-CM | POA: Diagnosis not present

## 2018-04-07 DIAGNOSIS — H35033 Hypertensive retinopathy, bilateral: Secondary | ICD-10-CM | POA: Diagnosis not present

## 2018-04-07 DIAGNOSIS — H35361 Drusen (degenerative) of macula, right eye: Secondary | ICD-10-CM | POA: Diagnosis not present

## 2018-04-14 DIAGNOSIS — R3914 Feeling of incomplete bladder emptying: Secondary | ICD-10-CM | POA: Diagnosis not present

## 2018-04-14 DIAGNOSIS — M6281 Muscle weakness (generalized): Secondary | ICD-10-CM | POA: Diagnosis not present

## 2018-04-14 DIAGNOSIS — N393 Stress incontinence (female) (male): Secondary | ICD-10-CM | POA: Diagnosis not present

## 2018-04-14 DIAGNOSIS — M62838 Other muscle spasm: Secondary | ICD-10-CM | POA: Diagnosis not present

## 2018-04-20 DIAGNOSIS — L2089 Other atopic dermatitis: Secondary | ICD-10-CM | POA: Diagnosis not present

## 2018-04-20 DIAGNOSIS — L821 Other seborrheic keratosis: Secondary | ICD-10-CM | POA: Diagnosis not present

## 2018-04-20 DIAGNOSIS — D1801 Hemangioma of skin and subcutaneous tissue: Secondary | ICD-10-CM | POA: Diagnosis not present

## 2018-04-20 DIAGNOSIS — L570 Actinic keratosis: Secondary | ICD-10-CM | POA: Diagnosis not present

## 2018-05-27 DIAGNOSIS — M7632 Iliotibial band syndrome, left leg: Secondary | ICD-10-CM | POA: Diagnosis not present

## 2018-08-04 DIAGNOSIS — M2042 Other hammer toe(s) (acquired), left foot: Secondary | ICD-10-CM | POA: Diagnosis not present

## 2018-08-04 DIAGNOSIS — Z23 Encounter for immunization: Secondary | ICD-10-CM | POA: Diagnosis not present

## 2018-08-04 DIAGNOSIS — M150 Primary generalized (osteo)arthritis: Secondary | ICD-10-CM | POA: Diagnosis not present

## 2018-08-04 DIAGNOSIS — I10 Essential (primary) hypertension: Secondary | ICD-10-CM | POA: Diagnosis not present

## 2018-08-04 DIAGNOSIS — F418 Other specified anxiety disorders: Secondary | ICD-10-CM | POA: Diagnosis not present

## 2018-08-04 DIAGNOSIS — R600 Localized edema: Secondary | ICD-10-CM | POA: Diagnosis not present

## 2018-08-04 DIAGNOSIS — M2041 Other hammer toe(s) (acquired), right foot: Secondary | ICD-10-CM | POA: Diagnosis not present

## 2018-09-12 DIAGNOSIS — R3914 Feeling of incomplete bladder emptying: Secondary | ICD-10-CM | POA: Diagnosis not present

## 2018-09-12 DIAGNOSIS — N393 Stress incontinence (female) (male): Secondary | ICD-10-CM | POA: Diagnosis not present

## 2018-09-12 DIAGNOSIS — N130 Hydronephrosis with ureteropelvic junction obstruction: Secondary | ICD-10-CM | POA: Diagnosis not present

## 2018-10-03 DIAGNOSIS — H532 Diplopia: Secondary | ICD-10-CM | POA: Diagnosis not present

## 2018-10-03 DIAGNOSIS — H40013 Open angle with borderline findings, low risk, bilateral: Secondary | ICD-10-CM | POA: Diagnosis not present

## 2018-10-03 DIAGNOSIS — H01009 Unspecified blepharitis unspecified eye, unspecified eyelid: Secondary | ICD-10-CM | POA: Diagnosis not present

## 2018-10-03 DIAGNOSIS — H04123 Dry eye syndrome of bilateral lacrimal glands: Secondary | ICD-10-CM | POA: Diagnosis not present

## 2018-11-03 ENCOUNTER — Telehealth: Payer: Self-pay | Admitting: Adult Health

## 2018-11-03 NOTE — Telephone Encounter (Signed)
Left voicemail for appointment changes and sent out confirmation letter.

## 2018-11-08 DIAGNOSIS — L304 Erythema intertrigo: Secondary | ICD-10-CM | POA: Diagnosis not present

## 2018-11-08 DIAGNOSIS — D485 Neoplasm of uncertain behavior of skin: Secondary | ICD-10-CM | POA: Diagnosis not present

## 2018-11-09 ENCOUNTER — Other Ambulatory Visit: Payer: Self-pay | Admitting: Family Medicine

## 2018-11-09 DIAGNOSIS — Z1231 Encounter for screening mammogram for malignant neoplasm of breast: Secondary | ICD-10-CM

## 2018-11-11 ENCOUNTER — Encounter: Payer: Self-pay | Admitting: Adult Health

## 2018-12-07 ENCOUNTER — Ambulatory Visit
Admission: RE | Admit: 2018-12-07 | Discharge: 2018-12-07 | Disposition: A | Discharge status: Home / Self Care | Payer: PPO | Source: Ambulatory Visit | Attending: Family Medicine | Admitting: Family Medicine

## 2018-12-07 DIAGNOSIS — Z1231 Encounter for screening mammogram for malignant neoplasm of breast: Secondary | ICD-10-CM

## 2018-12-12 ENCOUNTER — Encounter: Payer: Self-pay | Admitting: Adult Health

## 2018-12-12 ENCOUNTER — Inpatient Hospital Stay: Payer: PPO | Attending: Adult Health | Admitting: Adult Health

## 2018-12-12 ENCOUNTER — Ambulatory Visit (HOSPITAL_COMMUNITY)
Admission: RE | Admit: 2018-12-12 | Discharge: 2018-12-12 | Disposition: A | Discharge status: Home / Self Care | Payer: PPO | Source: Ambulatory Visit | Attending: Adult Health | Admitting: Adult Health

## 2018-12-12 VITALS — BP 153/92 | HR 69 | Temp 98.1°F | Resp 18 | Wt 147.9 lb

## 2018-12-12 DIAGNOSIS — K219 Gastro-esophageal reflux disease without esophagitis: Secondary | ICD-10-CM | POA: Insufficient documentation

## 2018-12-12 DIAGNOSIS — Z170 Estrogen receptor positive status [ER+]: Secondary | ICD-10-CM | POA: Diagnosis not present

## 2018-12-12 DIAGNOSIS — R062 Wheezing: Secondary | ICD-10-CM

## 2018-12-12 DIAGNOSIS — R0602 Shortness of breath: Secondary | ICD-10-CM | POA: Insufficient documentation

## 2018-12-12 DIAGNOSIS — E7800 Pure hypercholesterolemia, unspecified: Secondary | ICD-10-CM | POA: Diagnosis not present

## 2018-12-12 DIAGNOSIS — M1990 Unspecified osteoarthritis, unspecified site: Secondary | ICD-10-CM | POA: Diagnosis not present

## 2018-12-12 DIAGNOSIS — F418 Other specified anxiety disorders: Secondary | ICD-10-CM | POA: Diagnosis not present

## 2018-12-12 DIAGNOSIS — Z9223 Personal history of estrogen therapy: Secondary | ICD-10-CM | POA: Diagnosis not present

## 2018-12-12 DIAGNOSIS — I10 Essential (primary) hypertension: Secondary | ICD-10-CM | POA: Diagnosis not present

## 2018-12-12 DIAGNOSIS — C50511 Malignant neoplasm of lower-outer quadrant of right female breast: Secondary | ICD-10-CM

## 2018-12-12 DIAGNOSIS — M8580 Other specified disorders of bone density and structure, unspecified site: Secondary | ICD-10-CM | POA: Insufficient documentation

## 2018-12-12 DIAGNOSIS — Z923 Personal history of irradiation: Secondary | ICD-10-CM | POA: Insufficient documentation

## 2018-12-12 DIAGNOSIS — Z79899 Other long term (current) drug therapy: Secondary | ICD-10-CM | POA: Insufficient documentation

## 2018-12-12 DIAGNOSIS — Z853 Personal history of malignant neoplasm of breast: Secondary | ICD-10-CM | POA: Diagnosis not present

## 2018-12-12 MED ORDER — ALBUTEROL SULFATE HFA 108 (90 BASE) MCG/ACT IN AERS: 2.0000 | INHALATION_SPRAY | Freq: Four times a day (QID) | RESPIRATORY_TRACT | 2 refills | Status: DC | PRN

## 2018-12-12 NOTE — Progress Notes (Signed)
CLINIC:  Survivorship   REASON FOR VISIT:  Routine follow-up for history of breast cancer.   BRIEF ONCOLOGIC HISTORY:    Breast cancer of lower-outer quadrant of right female breast ([place])   11/10/2004 Surgery    Right breast lumpectomy invasive ductal carcinoma grade 1; 0.5 cm 2 sentinel lymph nodes negative ER 85% PR 0% Ki-67 19% HER-2 2+ by IHC and negative by fish, no lymphovascular invasion T1, N0, M0 stage IA    12/29/2004 - 01/26/2005 Radiation Therapy    Radiation therapy to lumpectomy site    02/26/2005 - 02/26/2010 Anti-estrogen oral therapy    Aromasin 25 mg by mouth daily: Side effects including depression.      INTERVAL HISTORY:  Natalie West presents to the [facility] today for routine follow-up for her history of breast cancer.She notes she is feeling moderately well.  She sees her PCP regularly.  Her husband passed away and so her son lives behind her and checks on her.  She sees dermatology regularly.  She does not exercise.      REVIEW OF SYSTEMS:  Review of Systems  Constitutional: Negative for appetite change, chills, fatigue, fever and unexpected weight change.  HENT:   Negative for hearing loss, lump/mass and trouble swallowing.   Eyes: Negative for eye problems and icterus.  Respiratory: Negative for chest tightness, cough and shortness of breath.   Cardiovascular: Negative for chest pain, leg swelling and palpitations.  Gastrointestinal: Negative for abdominal distention, abdominal pain, constipation, diarrhea, nausea and vomiting.  Endocrine: Negative for hot flashes.  Genitourinary: Negative for difficulty urinating.   Musculoskeletal: Negative for arthralgias.  Skin: Negative for itching and rash.  Neurological: Negative for dizziness, extremity weakness, headaches and numbness.  Hematological: Negative for adenopathy. Does not bruise/bleed easily.  Psychiatric/Behavioral: Negative for depression. The patient is not nervous/anxious.   Breast:  Denies any new nodularity, masses, tenderness, nipple changes, or nipple discharge.       PAST MEDICAL/SURGICAL HISTORY:  Past Medical History:  Diagnosis Date  . Anxiety   . Arthritis   . Breast cancer ([place]) 2006   right breast  . Chronic cough   . Chronic UTI   . Depression   . Dysrhythmia   . Esophageal reflux   . Esophageal stricture   . Family history of malignant neoplasm of gastrointestinal tract   . GERD (gastroesophageal reflux disease)   . Hearing loss   . Hemorrhoids    internal  . History of hiatal hernia   . Hypercholesterolemia   . Hypertension   . Numbness and tingling    feeet, occasionally  . Osteopenia   . Personal history of colonic polyps 02/09/2007   TUBULAR ADENOMA  . Personal history of radiation therapy   . PONV (postoperative nausea and vomiting)    trouble opening mouth and jaw, jaw pops, trouble turning head  . Rotator cuff tear    RT  . Status post dilation of esophageal narrowing   . Umbilical hernia    Past Surgical History:  Procedure Laterality Date  . BREAST LUMPECTOMY  2006   right lumpectomy  . CATARACT EXTRACTION W/ INTRAOCULAR LENS IMPLANT Bilateral   . COLONOSCOPY    . CYSTOCELE REPAIR N/A 04/20/2016   Procedure: AUGMENTED ANTERIOR VAULT REPAIR, COLOPLAST DERMIS GRAFT KELLY PLICATION SACROSPINOUS FIXATION;  Surgeon: Carolan Clines, MD;  Location: [facility];  Service: Urology;  Laterality: N/A;  . DILATION AND CURETTAGE OF UTERUS    . ESOPHAGOGASTRODUODENOSCOPY ENDOSCOPY    .  PUBOVAGINAL SLING N/A 04/20/2016   Procedure: PUBO-VAGINAL [name];  Surgeon: Carolan Clines, MD;  Location: [facility];  Service: Urology;  Laterality: N/A;  . REVISION URINARY SLING N/A 06/12/2016   Procedure: INCISION OF URETHRAL SLING;  Surgeon: Carolan Clines, MD;  Location: [facility];  Service: Urology;  Laterality: N/A;  1 HOUR  . SHOULDER OPEN ROTATOR CUFF REPAIR Right 02/28/2013   Procedure: RIGHT ROTATOR CUFF REPAIR SHOULDER OPEN WITH GRAFT AND  ANCHORS;  Surgeon: Tobi Bastos, MD;  Location: [facility];  Service: Orthopedics;  Laterality: Right;  . UMBILICAL HERNIA REPAIR       ALLERGIES:  Allergies  Allergen Reactions  . Ciprofloxacin Other (See Comments)    Reaction unknown  . Clindamycin/Lincomycin Other (See Comments)    Reaction unknown   . Codeine Nausea And Vomiting  . Diclofenac Other (See Comments)    Reaction unknown   . Fenoprofen Calcium Other (See Comments)    Reaction unknown   . Flexeril [Cyclobenzaprine Hcl] Other (See Comments)    Reaction unknown   . Hydrocodone Other (See Comments)    "Last time it was too strong and messed up my mind"  . Macrobid [Nitrofurantoin] Other (See Comments)    Reaction unknown   . Noroxin [Norfloxacin] Other (See Comments)    Reaction unknown   . Relafen [Nabumetone] Other (See Comments)    Reaction unknown   . Sulfa Antibiotics Other (See Comments)    Reaction unknown      CURRENT MEDICATIONS:  Outpatient Encounter Medications as of 12/12/2018  Medication Sig Note  . acetaminophen (TYLENOL) 325 MG tablet Take 650 mg by mouth every 6 (six) hours as needed for moderate pain.   Marland Kitchen amitriptyline (ELAVIL) 25 MG tablet TAKE 1 TABLET BY MOUTH EVERY DAY AT NIGHT   . atenolol (TENORMIN) 25 MG tablet TAKE ONE TABLET DAILY TO PREVENT PALPITATIONS. 06/10/2016: Takes at hs  . bethanechol (URECHOLINE) 25 MG tablet Take 25 mg by mouth 2 (two) times daily.   . Calcium Carb-Cholecalciferol (CALCIUM 600 + D PO) Take 1 tablet by mouth daily.   . fluticasone (CUTIVATE) 0.005 % ointment Apply 1 application topically daily as needed (itching).   . hydrochlorothiazide (MICROZIDE) 12.5 MG capsule Take 12.5 mg by mouth daily.   . meclizine (ANTIVERT) 12.5 MG tablet Take 12.5 mg by mouth 2 (two) times daily.   . Multiple Vitamin (MULTIVITAMIN WITH MINERALS) TABS tablet Take 1 tablet by mouth daily.   Marland Kitchen omeprazole (PRILOSEC) 20 MG capsule Take 20 mg by mouth daily as needed (For heartburn  or acid reflux.).    Marland Kitchen perphenazine (TRILAFON) 2 MG tablet    . polyethylene glycol powder (GLYCOLAX/MIRALAX) powder Take 17 g by mouth daily.   . pravastatin (PRAVACHOL) 20 MG tablet Take 20 mg by mouth at bedtime.    . [DISCONTINUED] perphenazine-amitriptyline (ETRAFON/TRIAVIL) 2-10 MG TABS Take 1 tablet by mouth 2 (two) times daily.    Facility-Administered Encounter Medications as of 12/12/2018  Medication  . 0.9 %  sodium chloride infusion     ONCOLOGIC FAMILY HISTORY:  Family History  Problem Relation Age of Onset  . Colon cancer Mother 93  . Kidney cancer Mother   . Glaucoma Mother   . Heart disease Sister   . Stomach cancer Brother   . Stroke Sister   . Lung cancer Brother   . Clotting disorder Son   . Emphysema Father        smoker  . Hypertension Sister  SOCIAL HISTORY:  Natalie West is widowed and lives alone in [place], [place].  She has one son who lives in a trailer behind her house.  Ms. Stockinger is currently retired.  She denies any current or history of tobacco, alcohol, or illicit drug use.  (reviwed and unchanged on 12/12/2018)   PHYSICAL EXAMINATION:  Vital Signs: Vitals:   12/12/18 1515  BP: (!) 153/92  Pulse: 69  Resp: 18  Temp: 98.1 F (36.7 C)  SpO2: 98%   Filed Weights   12/12/18 1515  Weight: 147 lb 14.4 oz (67.1 kg)   General: Well-nourished, well-appearing female in no acute distress.  Unaccompanied today.   HEENT: Head is normocephalic.  Pupils equal and reactive to light. Conjunctivae clear without exudate.  Sclerae anicteric. Oral mucosa is pink, moist.  Oropharynx is pink without lesions or erythema.  Lymph: No cervical, supraclavicular, or infraclavicular lymphadenopathy noted on palpation.  Cardiovascular: Regular rate and rhythm.Marland Kitchen Respiratory: Expiratory wheezing on exam.  Chest expansion symmetric; breathing non-labored.  Breast Exam:  -Left breast: No appreciable masses on palpation. No skin redness, thickening, or  peau d'orange appearance;  ;-Right breast: No appreciable masses on palpation. No skin redness, thickening, or peau d'orange appearance; no nipple retraction or nipple discharge; mild distortion in symmetry at previous lumpectomy site well healed scar without erythema or nodularity. -Axilla: No axillary adenopathy bilaterally.  GI: Abdomen soft and round; non-tender, non-distended. Bowel sounds normoactive. No hepatosplenomegaly.   GU: Deferred.  Neuro: No focal deficits. Steady gait.  Psych: Mood and affect normal and appropriate for situation.  MSK: No focal spinal tenderness to palpation, full range of motion in bilateral upper extremities Extremities: No edema. Skin: Warm and dry.    LABORATORY DATA:  None for this visit   DIAGNOSTIC IMAGING:  Most recent mammogram:     ASSESSMENT AND PLAN:  Ms.. Laymon is a pleasant 86 y.o. female with history of Stage IA right breast invasive ductal carcinoma, ER+/PR-/HER2-, diagnosed in 11/2004, treated with lumpectomy, adjuvant radiation therapy, and anti-estrogen therapy with Aromasin x 5 years completed in 01/2010.  She presents to the [facility] for surveillance and routine follow-up.   1. History of breast cancer:  Ms. Holte is currently clinically and radiographically without evidence of disease or recurrence of breast cancer. She will be due for mammogram in 12/2019.  I offered Amamda graduation today and to f/u with her PCP for her surveillance. I encouraged her to call me with any questions or concerns before her next visit at the cancer center, and I would be happy to see her sooner, if needed.    2. Bone health:  Given Ms. Renfrew age, history of breast cancer, and her previous anti-estrogen therapy with Aromasin, she is at risk for bone demineralization. Her last DEXA scan was on 06/03/2017 and demonstrated osteopenia with a t score of -2.4 in the left forearm . Given that she is no longer on Aromasin, I will defer to her PCP regarding bone  density testing and management.  She was given education on specific food and activities to promote bone health.  3. Cancer screening:  Due to Ms. Witting's history and her age, she should receive screening for skin cancers. She was encouraged to follow-up with her PCP for appropriate cancer screenings.   4. Health maintenance and wellness promotion: Ms. Pillard was encouraged to consume 5-7 servings of fruits and vegetables per day. She was also encouraged to engage in moderate to vigorous exercise for 30  minutes per day most days of the week. She was instructed to limit her alcohol consumption and continue to abstain from tobacco use.   5. Wheezing: Will get chest xray, prescribed albuterol for her to take.  ? Related to COPD from second hand smoke exposure from her son.    Dispo:  -Return to cancer center in one year for lts follow up -mammogram in 12/2019    A total of (30) minutes of face-to-face time was spent with this patient with greater than 50% of that time in counseling and care-coordination.   Gardenia Phlegm, [place] [phone]   Note: PRIMARY CARE PROVIDER Christain Sacramento, [place] [phone]

## 2018-12-13 ENCOUNTER — Telehealth: Payer: Self-pay | Admitting: Adult Health

## 2018-12-13 ENCOUNTER — Telehealth: Payer: Self-pay

## 2018-12-13 NOTE — Telephone Encounter (Signed)
No los °

## 2018-12-13 NOTE — Telephone Encounter (Signed)
-----   Message from Gardenia Phlegm, NP sent at 12/13/2018  7:34 AM EST ----- Hey,  Please call patient about chest xray.  It is normal.  She needs to see her PCP about the wheezing, and particularly if she develops any other symptoms or if it gets worse.  Thanks, [place]

## 2018-12-13 NOTE — Telephone Encounter (Signed)
Spoke with patient informing of normal chest xray.  Per NP recommendation patient should see her PCP concerning the wheezing is having and also if any other symptoms arise.  Patient voiced understanding of information given and had no other concerns.

## 2018-12-19 DIAGNOSIS — L570 Actinic keratosis: Secondary | ICD-10-CM | POA: Diagnosis not present

## 2018-12-19 DIAGNOSIS — L281 Prurigo nodularis: Secondary | ICD-10-CM | POA: Diagnosis not present

## 2018-12-19 DIAGNOSIS — L603 Nail dystrophy: Secondary | ICD-10-CM | POA: Diagnosis not present

## 2018-12-23 ENCOUNTER — Telehealth: Payer: Self-pay | Admitting: Adult Health

## 2018-12-23 NOTE — Telephone Encounter (Signed)
Scheduled appt per 2/21 sch message - sent reminder letter in the mail with appt date and time

## 2018-12-23 NOTE — Telephone Encounter (Signed)
Called patient per VM scheduling log.  Transferred patient call to the MD nurse.  She had questions about if she needed to schedule a follow up appt.

## 2019-05-01 DIAGNOSIS — Z961 Presence of intraocular lens: Secondary | ICD-10-CM | POA: Diagnosis not present

## 2019-05-01 DIAGNOSIS — H35363 Drusen (degenerative) of macula, bilateral: Secondary | ICD-10-CM | POA: Diagnosis not present

## 2019-05-01 DIAGNOSIS — H40013 Open angle with borderline findings, low risk, bilateral: Secondary | ICD-10-CM | POA: Diagnosis not present

## 2019-05-01 DIAGNOSIS — H35033 Hypertensive retinopathy, bilateral: Secondary | ICD-10-CM | POA: Diagnosis not present

## 2019-09-21 DIAGNOSIS — R3915 Urgency of urination: Secondary | ICD-10-CM | POA: Diagnosis not present

## 2019-09-21 DIAGNOSIS — R351 Nocturia: Secondary | ICD-10-CM | POA: Diagnosis not present

## 2019-09-27 DIAGNOSIS — L981 Factitial dermatitis: Secondary | ICD-10-CM | POA: Diagnosis not present

## 2019-09-27 DIAGNOSIS — L603 Nail dystrophy: Secondary | ICD-10-CM | POA: Diagnosis not present

## 2019-09-27 DIAGNOSIS — L249 Irritant contact dermatitis, unspecified cause: Secondary | ICD-10-CM | POA: Diagnosis not present

## 2019-10-07 ENCOUNTER — Other Ambulatory Visit: Payer: Self-pay | Admitting: Adult Health

## 2019-10-07 DIAGNOSIS — R062 Wheezing: Secondary | ICD-10-CM

## 2019-10-12 DIAGNOSIS — H04123 Dry eye syndrome of bilateral lacrimal glands: Secondary | ICD-10-CM | POA: Diagnosis not present

## 2019-10-12 DIAGNOSIS — H0102A Squamous blepharitis right eye, upper and lower eyelids: Secondary | ICD-10-CM | POA: Diagnosis not present

## 2019-10-12 DIAGNOSIS — H0102B Squamous blepharitis left eye, upper and lower eyelids: Secondary | ICD-10-CM | POA: Diagnosis not present

## 2019-10-12 DIAGNOSIS — H40013 Open angle with borderline findings, low risk, bilateral: Secondary | ICD-10-CM | POA: Diagnosis not present

## 2019-10-24 DIAGNOSIS — L218 Other seborrheic dermatitis: Secondary | ICD-10-CM | POA: Diagnosis not present

## 2019-10-24 DIAGNOSIS — L981 Factitial dermatitis: Secondary | ICD-10-CM | POA: Diagnosis not present

## 2019-10-31 ENCOUNTER — Other Ambulatory Visit: Payer: Self-pay | Admitting: Family Medicine

## 2019-10-31 DIAGNOSIS — R3914 Feeling of incomplete bladder emptying: Secondary | ICD-10-CM | POA: Diagnosis not present

## 2019-10-31 DIAGNOSIS — R3915 Urgency of urination: Secondary | ICD-10-CM | POA: Diagnosis not present

## 2019-10-31 DIAGNOSIS — Z1231 Encounter for screening mammogram for malignant neoplasm of breast: Secondary | ICD-10-CM

## 2019-10-31 DIAGNOSIS — R351 Nocturia: Secondary | ICD-10-CM | POA: Diagnosis not present

## 2019-11-15 ENCOUNTER — Telehealth: Payer: Self-pay

## 2019-11-15 NOTE — Telephone Encounter (Signed)
NOTES ON FILE FROM [facility] [phone], SENT REFERRAL TO SCHEDULING °

## 2019-11-28 ENCOUNTER — Telehealth: Payer: Self-pay

## 2019-11-28 NOTE — Telephone Encounter (Signed)
Faxed notes to [facility]

## 2019-11-30 ENCOUNTER — Ambulatory Visit (INDEPENDENT_AMBULATORY_CARE_PROVIDER_SITE_OTHER): Payer: Medicare Other | Admitting: Cardiovascular Disease

## 2019-11-30 ENCOUNTER — Encounter: Payer: Self-pay | Admitting: Cardiovascular Disease

## 2019-11-30 ENCOUNTER — Encounter (INDEPENDENT_AMBULATORY_CARE_PROVIDER_SITE_OTHER): Payer: Self-pay

## 2019-11-30 ENCOUNTER — Other Ambulatory Visit: Payer: Self-pay

## 2019-11-30 VITALS — BP 126/82 | HR 73 | Temp 97.3°F | Ht 62.0 in | Wt 150.0 lb

## 2019-11-30 DIAGNOSIS — E785 Hyperlipidemia, unspecified: Secondary | ICD-10-CM

## 2019-11-30 DIAGNOSIS — I4891 Unspecified atrial fibrillation: Secondary | ICD-10-CM

## 2019-11-30 DIAGNOSIS — I272 Pulmonary hypertension, unspecified: Secondary | ICD-10-CM

## 2019-11-30 DIAGNOSIS — Z853 Personal history of malignant neoplasm of breast: Secondary | ICD-10-CM

## 2019-11-30 DIAGNOSIS — I10 Essential (primary) hypertension: Secondary | ICD-10-CM

## 2019-11-30 NOTE — Patient Instructions (Signed)
Medication Instructions:  BEGIN TAKING ASPIRIN 81MG  DAILY= 1 TABLET (OVER THE COUNTER) *If you need a refill on your cardiac medications before your next appointment, please call your pharmacy*  Lab Work: CMET, TSH, LIPID, MAG, CBC FASTING LAB WORK If you have labs (blood work) drawn today and your tests are completely normal, you will receive your results only by: Marland Kitchen MyChart Message (if you have MyChart) OR . A paper copy in the mail If you have any lab test that is abnormal or we need to change your treatment, we will call you to review the results.  TESTING: Your physician has requested that you have an echocardiogram. Echocardiography is a painless test that uses sound waves to create images of your heart. It provides your doctor with information about the size and shape of your heart and how well your heart's chambers and valves are working. This procedure takes approximately one hour. There are no restrictions for this procedure.  [place]  Follow-Up: At [facility], you and your health needs are our priority.  As part of our continuing mission to provide you with exceptional heart care, we have created designated Provider Care Teams.  These Care Teams include your primary Cardiologist (physician) and Advanced Practice Providers (APPs -  Physician Assistants and Nurse Practitioners) who all work together to provide you with the care you need, when you need it.  Your next appointment:   2 month(s)  The format for your next appointment:   In Person  Provider:   Shelva Majestic, MD   Other Instructions FOLLOW UP AFTER ECHO

## 2019-11-30 NOTE — Progress Notes (Signed)
Cardiology Office Note    Date:  12/03/2019   ID:  Jodee, Wagenaar 1931/05/31, MRN [id number]  PCP:  Christain Sacramento, MD  Cardiologist:  Shelva Majestic, MD   New cardiology evaluation referred through the courtesy of Cyndi Bender, PA-C for evaluation of dizziness and atrial fibrillation.  History of Present Illness:  Natalie West is a 86 y.o. female who has never seen a cardiologist previously.  She was recently evaluated at [place] by Edgar Frisk, PA-C with complaints of dizziness.  Her ECG showed atrial fibrillation and is referred for cardiology evaluation.  Natalie West has been followed by Dr. Kathryne Eriksson at [facility] family [facility]..  Recently, she has noticed episodes of dizziness which commenced several weeks previously.  She has been started on dextral LA on October 31, 2019 which she discontinued.  Her dizziness resolved the following day but then returned and she has had waxing and waning episodes.  She has felt that she is off balance and feels that she is going to fall.  She tells me that she has had 2 falls in the past but was uncertain as to when these occurred.  She also has noted associated symptoms of some nausea.  She denied abdominal pain, chest pain, awareness of any heart rate irregularity.  She had been tried on meclizine without benefit.  An ECG done November 13, 2019 revealed atrial fibrillation with a ventricular rate at 61 bpm.  Reportedly the patient has a history of atrial fibrillation for which she has been on atenolol but is not aware if this has been permanent atrial fibrillation or paroxysmal.  In 2016, she had undergone an echo Doppler study on January 14 at [facility] which showed an EF 55 to 60%.  She had normal wall motion.  PA pressure was 39 mm.  Valves were structurally normal.   She has a history of breast cancer, diagnosed in January 2006, treated with radiation therapy and antiestrogen therapy.  She is felt to  be clinically and radiographically free of recurrent disease.  At present, she denies any chest pain.  She continues to experience episodes of lightheadedness and dizziness.  Her gait is unbalanced.  She lives by herself.  She presents for initial evaluation.   Past Medical History:  Diagnosis Date  . Anxiety   . Arthritis   . Breast cancer ([place]) 2006   right breast  . Chronic cough   . Chronic UTI   . Depression   . Dysrhythmia   . Esophageal reflux   . Esophageal stricture   . Family history of malignant neoplasm of gastrointestinal tract   . GERD (gastroesophageal reflux disease)   . Hearing loss   . Hemorrhoids    internal  . History of hiatal hernia   . Hypercholesterolemia   . Hypertension   . Numbness and tingling    feeet, occasionally  . Osteopenia   . Personal history of colonic polyps 02/09/2007   TUBULAR ADENOMA  . Personal history of radiation therapy   . PONV (postoperative nausea and vomiting)    trouble opening mouth and jaw, jaw pops, trouble turning head  . Rotator cuff tear    RT  . Status post dilation of esophageal narrowing   . Umbilical hernia     Past Surgical History:  Procedure Laterality Date  . BREAST LUMPECTOMY  2006   right lumpectomy  . CATARACT EXTRACTION W/ INTRAOCULAR LENS IMPLANT Bilateral   . COLONOSCOPY    .  CYSTOCELE REPAIR N/A 04/20/2016   Procedure: AUGMENTED ANTERIOR VAULT REPAIR, COLOPLAST DERMIS GRAFT [name] PLICATION SACROSPINOUS FIXATION;  Surgeon: Carolan Clines, MD;  Location: [facility];  Service: Urology;  Laterality: N/A;  . DILATION AND CURETTAGE OF UTERUS    . ESOPHAGOGASTRODUODENOSCOPY ENDOSCOPY    . PUBOVAGINAL SLING N/A 04/20/2016   Procedure: PUBO-VAGINAL [name];  Surgeon: Carolan Clines, MD;  Location: [facility];  Service: Urology;  Laterality: N/A;  . REVISION URINARY SLING N/A 06/12/2016   Procedure: INCISION OF URETHRAL SLING;  Surgeon: Carolan Clines, MD;  Location: [facility];  Service: Urology;   Laterality: N/A;  1 HOUR  . SHOULDER OPEN ROTATOR CUFF REPAIR Right 02/28/2013   Procedure: RIGHT ROTATOR CUFF REPAIR SHOULDER OPEN WITH GRAFT AND ANCHORS;  Surgeon: Tobi Bastos, MD;  Location: [facility];  Service: Orthopedics;  Laterality: Right;  . UMBILICAL HERNIA REPAIR      Current Medications: Outpatient Medications Prior to Visit  Medication Sig Dispense Refill  . acetaminophen (TYLENOL) 325 MG tablet Take 650 mg by mouth every 6 (six) hours as needed for moderate pain.    Marland Kitchen albuterol (PROVENTIL HFA;VENTOLIN HFA) 108 (90 Base) MCG/ACT inhaler Inhale 2 puffs into the lungs every 6 (six) hours as needed for wheezing or shortness of breath. 1 Inhaler 2  . amitriptyline (ELAVIL) 25 MG tablet TAKE 1 TABLET BY MOUTH EVERY DAY AT NIGHT    . atenolol (TENORMIN) 25 MG tablet TAKE ONE TABLET DAILY TO PREVENT PALPITATIONS.  5  . bethanechol (URECHOLINE) 25 MG tablet Take 25 mg by mouth 2 (two) times daily.    . Calcium Carb-Cholecalciferol (CALCIUM 600 + D PO) Take 1 tablet by mouth daily.    . fluticasone (CUTIVATE) 0.005 % ointment Apply 1 application topically daily as needed (itching).    . hydrochlorothiazide (MICROZIDE) 12.5 MG capsule Take 12.5 mg by mouth daily.  3  . meclizine (ANTIVERT) 12.5 MG tablet Take 12.5 mg by mouth 2 (two) times daily.    . Multiple Vitamin (MULTIVITAMIN WITH MINERALS) TABS tablet Take 1 tablet by mouth daily.    Marland Kitchen omeprazole (PRILOSEC) 20 MG capsule Take 20 mg by mouth daily as needed (For heartburn or acid reflux.).     Marland Kitchen perphenazine (TRILAFON) 2 MG tablet     . polyethylene glycol powder (GLYCOLAX/MIRALAX) powder Take 17 g by mouth daily.    . pravastatin (PRAVACHOL) 20 MG tablet Take 20 mg by mouth at bedtime.      Facility-Administered Medications Prior to Visit  Medication Dose Route Frequency Provider Last Rate Last Admin  . 0.9 %  sodium chloride infusion  500 mL Intravenous Continuous Danis, Estill Cotta III, MD         Allergies:   Ciprofloxacin,  Clindamycin/lincomycin, Codeine, Diclofenac, Fenoprofen calcium, Flexeril [cyclobenzaprine hcl], Hydrocodone, Macrobid [nitrofurantoin], Noroxin [norfloxacin], Relafen [nabumetone], and Sulfa antibiotics   Social History   Socioeconomic History  . Marital status: Widowed    Spouse name: Not on file  . Number of children: 1  . Years of education: Not on file  . Highest education level: Not on file  Occupational History  . Occupation: retired    Fish farm manager: RETIRED  Tobacco Use  . Smoking status: Never Smoker  . Smokeless tobacco: Never Used  Substance and Sexual Activity  . Alcohol use: No    Alcohol/week: 0.0 standard drinks  . Drug use: No  . Sexual activity: Never    Partners: Male  Other Topics Concern  . Not on  file  Social History Narrative  . Not on file   Social Determinants of Health   Financial Resource Strain:   . Difficulty of Paying Living Expenses: Not on file  Food Insecurity:   . Worried About Charity fundraiser in the Last Year: Not on file  . Ran Out of Food in the Last Year: Not on file  Transportation Needs:   . Lack of Transportation (Medical): Not on file  . Lack of Transportation (Non-Medical): Not on file  Physical Activity:   . Days of Exercise per Week: Not on file  . Minutes of Exercise per Session: Not on file  Stress:   . Feeling of Stress : Not on file  Social Connections:   . Frequency of Communication with Friends and Family: Not on file  . Frequency of Social Gatherings with Friends and Family: Not on file  . Attends Religious Services: Not on file  . Active Member of Clubs or Organizations: Not on file  . Attends Archivist Meetings: Not on file  . Marital Status: Not on file     Family History:  The patient's family history includes Clotting disorder in her son; Colon cancer (age of onset: 62) in her mother; Emphysema in her father; Glaucoma in her mother; Heart disease in her sister; Hypertension in her sister; Kidney  cancer in her mother; Lung cancer in her brother; Stomach cancer in her brother; Stroke in her sister.   Her mother died at age 37 and had kidney cancer; father died at age 68 and had emphysema and heart problems, a brother died at age 69 with poor circulation status post bilateral leg amputations, another brother died at age 13 with stomach cancer.  A sister died at age 64 with blood disorder  ROS General: Negative; No fevers, chills, or night sweats;  HEENT: Negative; No changes in vision or hearing, sinus congestion, difficulty swallowing Pulmonary: Negative; No cough, wheezing, shortness of breath, hemoptysis Cardiovascular: See HPI GI: Negative; No nausea, vomiting, diarrhea, or abdominal pain GU: Negative; No dysuria, hematuria, or difficulty voiding Musculoskeletal: Negative; no myalgias, joint pain, or weakness Hematologic/Oncology: Breast cancer survivor Endocrine: Negative; no heat/cold intolerance; no diabetes Neuro: Negative; no changes in balance, headaches Skin: Negative; No rashes or skin lesions Psychiatric: Negative; No behavioral problems, depression Sleep: Negative; No snoring, daytime sleepiness, hypersomnolence, bruxism, restless legs, hypnogognic hallucinations, no cataplexy Other comprehensive 14 point system review is negative.   PHYSICAL EXAM:   VS:  BP 126/82 (BP Location: Left Arm)   Pulse 73   Temp (!) 97.3 F (36.3 C)   Ht 5' 2"  (1.575 m)   Wt 150 lb (68 kg)   LMP 11/03/1983   SpO2 98%   BMI 27.44 kg/m     Blood pressure by me 130/80 supine 122/78 standing  Wt Readings from Last 3 Encounters:  11/30/19 150 lb (68 kg)  12/12/18 147 lb 14.4 oz (67.1 kg)  11/05/17 148 lb (67.1 kg)    General: Alert, oriented, no distress.  Skin: normal turgor, no rashes, warm and dry HEENT: Normocephalic, atraumatic. Pupils equal round and reactive to light; sclera anicteric; extraocular muscles intact;  Nose without nasal septal hypertrophy Mouth/Parynx benign;  Mallinpatti scale 2 Neck: No JVD, no carotid bruits; normal carotid upstroke Lungs: clear to ausculatation and percussion; no wheezing or rales Chest wall: without tenderness to palpitation Heart: PMI not displaced, irregularly irregular rhythm with  rate controlled, s1 s2 normal, 1/6 systolic murmur, no diastolic murmur,  no rubs, gallops, thrills, or heaves Abdomen: soft, nontender; no hepatosplenomehaly, BS+; abdominal aorta nontender and not dilated by palpation. Back: no CVA tenderness Pulses 2+ Musculoskeletal: full range of motion, normal strength, no joint deformities Extremities: no clubbing cyanosis or edema, Homan's sign negative  Neurologic: grossly nonfocal; Cranial nerves grossly wnl Psychologic: Normal mood and affect   Studies/Labs Reviewed:   EKG:  EKG is  ordered today.  ECG (independently read by me): Atrial fibrillation at 70 bpm; QTc 460 ms  Recent Labs: BMP Latest Ref Rng & Units 06/10/2016 04/21/2016 04/20/2016  Glucose 65 - 99 mg/dL 92 197(H) 160(H)  BUN 6 - 20 mg/dL 22(H) 15 14  Creatinine 0.44 - 1.00 mg/dL 0.89 0.82 0.84  Sodium 135 - 145 mmol/L 140 133(L) 137  Potassium 3.5 - 5.1 mmol/L 3.8 3.4(L) 3.1(L)  Chloride 101 - 111 mmol/L 105 102 104  CO2 22 - 32 mmol/L 29 25 26   Calcium 8.9 - 10.3 mg/dL 9.1 8.0(L) 8.1(L)     Hepatic Function Latest Ref Rng & Units 06/28/2014 03/31/2013 02/28/2013  Total Protein 6.4 - 8.3 g/dL 6.6 6.6 6.7  Albumin 3.5 - 5.0 g/dL 3.7 3.5 3.8  AST 5 - 34 U/L 25 17 21   ALT 0 - 55 U/L 16 8 11   Alk Phosphatase 40 - 150 U/L 85 102 93  Total Bilirubin 0.20 - 1.20 mg/dL 0.47 0.49 0.4    CBC Latest Ref Rng & Units 06/10/2016 04/21/2016 04/20/2016  WBC 4.0 - 10.5 K/uL 6.9 12.6(H) 8.7  Hemoglobin 12.0 - 15.0 g/dL 12.0 9.7(L) 11.7(L)  Hematocrit 36.0 - 46.0 % 37.1 29.6(L) 35.4(L)  Platelets 150 - 400 K/uL 196 139(L) 142(L)   Lab Results  Component Value Date   MCV 96.1 06/10/2016   MCV 96.4 04/21/2016   MCV 96.2 04/20/2016   No results  found for: TSH No results found for: HGBA1C   BNP No results found for: BNP  ProBNP No results found for: PROBNP   Lipid Panel  No results found for: CHOL, TRIG, HDL, CHOLHDL, VLDL, LDLCALC, LDLDIRECT, LABVLDL   RADIOLOGY: No results found.   Additional studies/ records that were reviewed today include:   I reviewed the records from [facility] family practice.  I reviewed a prior echo Doppler study from November 15, 2014    ASSESSMENT:    1. Atrial fibrillation, unspecified type ([place])   2. Essential hypertension   3. Hyperlipidemia, unspecified hyperlipidemia type   4. History of breast cancer   5. Mild pulmonary hypertension [facility]): Echo November 15, 2014      PLAN:  Natalie West is an 86 year old female who has a history of breast CA and underwent successful chemotherapy and antiestrogen therapy.  She is now felt to be in complete remission.  There is reported history of atrial fibrillation for which she has been on a atenolol.  I do not have records verifying that her atrial fibrillation is either permanent or paroxysmal.  Recently, she has begun to notice episodes of dizziness and lightheadedness.  She admits to falling at least 2 occasions but could not recall exactly when these falls occurred but they had not occurred within the recent month.  She had issues with balance and had been started on Antivert.  She also had been on dextral LA which ultimately was stopped.  Her ECG from November 12, 2018 at [facility] family practice reveals atrial fibrillation at 61 bpm.  Her ECG today confirms atrial fibrillation is still present at 70 bpm.  QTc interval is 460 ms.  Her atrial fibrillation rate is controlled on her current dose of a atenolol 25 mg daily.  I had a long discussion with her in the office today concerning the advantages and disadvantages of initiation of anticoagulation therapy.  We discussed potential stroke risk versus bleeding risk in detail.  I am concerned that she  lives by herself.  She has experienced several falls.  That her gait has been imbalanced potentially increasing risk for recurrent falls.  After long discussion I have recommended initiation of aspirin therapy rather than systemic anticoagulation.  On exam she is not orthostatic.  Her blood pressure today is stable on atenolol 2 mg in addition to HCTZ 12.5 mg daily.  I am recommending she undergo a 2D echo Doppler study to reassess LV systolic and diastolic function as well as atrial chamber size valvular issues in previously documented mild pulmonary hypertension.  Laboratory will be checked consisting of a CBC, chemistry profile, TSH, magnesium level and lipid studies.  She has been on pravastatin 20 mg for hyperlipidemia.  Her GERD has been controlled with omeprazole.  She has been taking amitriptyline 25 mg at bedtime.  I will see her in follow-up and further recommendations will be made at that time.   Medication Adjustments/Labs and Tests Ordered: Current medicines are reviewed at length with the patient today.  Concerns regarding medicines are outlined above.  Medication changes, Labs and Tests ordered today are listed in the Patient Instructions below. Patient Instructions  Medication Instructions:  BEGIN TAKING ASPIRIN 81MG DAILY= 1 TABLET (OVER THE COUNTER) *If you need a refill on your cardiac medications before your next appointment, please call your pharmacy*  Lab Work: CMET, TSH, LIPID, MAG, CBC FASTING LAB WORK If you have labs (blood work) drawn today and your tests are completely normal, you will receive your results only by: Marland Kitchen MyChart Message (if you have MyChart) OR . A paper copy in the mail If you have any lab test that is abnormal or we need to change your treatment, we will call you to review the results.  TESTING: Your physician has requested that you have an echocardiogram. Echocardiography is a painless test that uses sound waves to create images of your heart. It provides  your doctor with information about the size and shape of your heart and how well your heart's chambers and valves are working. This procedure takes approximately one hour. There are no restrictions for this procedure.  [place]  Follow-Up: At [facility], you and your health needs are our priority.  As part of our continuing mission to provide you with exceptional heart care, we have created designated Provider Care Teams.  These Care Teams include your primary Cardiologist (physician) and Advanced Practice Providers (APPs -  Physician Assistants and Nurse Practitioners) who all work together to provide you with the care you need, when you need it.  Your next appointment:   2 month(s)  The format for your next appointment:   In Person  Provider:   Shelva Majestic, MD   Other Instructions FOLLOW UP AFTER ECHO     Signed, Shelva Majestic, MD  12/03/2019 11:45 AM    [place] [address], [place], [place],   [address] Phone: [phone]

## 2019-12-03 ENCOUNTER — Encounter: Payer: Self-pay | Admitting: Cardiovascular Disease

## 2019-12-06 ENCOUNTER — Telehealth: Payer: Self-pay | Admitting: Adult Health

## 2019-12-06 LAB — CBC
Hematocrit: 39.1 % (ref 34.0–46.6)
Hemoglobin: 13.3 g/dL (ref 11.1–15.9)
MCH: 32.4 pg (ref 26.6–33.0)
MCHC: 34 g/dL (ref 31.5–35.7)
MCV: 95 fL (ref 79–97)
Platelets: 198 10*3/uL (ref 150–450)
RBC: 4.1 x10E6/uL (ref 3.77–5.28)
RDW: 11.8 % (ref 11.7–15.4)
WBC: 7 10*3/uL (ref 3.4–10.8)

## 2019-12-06 LAB — COMPREHENSIVE METABOLIC PANEL
ALT: 8 IU/L (ref 0–32)
AST: 20 IU/L (ref 0–40)
Albumin/Globulin Ratio: 1.9 (ref 1.2–2.2)
Albumin: 4.2 g/dL (ref 3.6–4.6)
Alkaline Phosphatase: 88 IU/L (ref 39–117)
BUN/Creatinine Ratio: 14 (ref 12–28)
BUN: 14 mg/dL (ref 8–27)
Bilirubin Total: 0.5 mg/dL (ref 0.0–1.2)
CO2: 25 mmol/L (ref 20–29)
Calcium: 9.2 mg/dL (ref 8.7–10.3)
Chloride: 103 mmol/L (ref 96–106)
Creatinine, Ser: 1.03 mg/dL — ABNORMAL HIGH (ref 0.57–1.00)
GFR calc Af Amer: 57 mL/min/{1.73_m2} — ABNORMAL LOW (ref >59)
GFR calc non Af Amer: 49 mL/min/{1.73_m2} — ABNORMAL LOW (ref >59)
Globulin, Total: 2.2 g/dL (ref 1.5–4.5)
Glucose: 86 mg/dL (ref 65–99)
Potassium: 4.5 mmol/L (ref 3.5–5.2)
Sodium: 142 mmol/L (ref 134–144)
Total Protein: 6.4 g/dL (ref 6.0–8.5)

## 2019-12-06 LAB — LIPID PANEL
Chol/HDL Ratio: 2.2 ratio (ref 0.0–4.4)
Cholesterol, Total: 123 mg/dL (ref 100–199)
HDL: 55 mg/dL (ref >39)
LDL Chol Calc (NIH): 54 mg/dL (ref 0–99)
Triglycerides: 67 mg/dL (ref 0–149)
VLDL Cholesterol Cal: 14 mg/dL (ref 5–40)

## 2019-12-06 LAB — TSH: TSH: 2.69 u[IU]/mL (ref 0.450–4.500)

## 2019-12-06 LAB — MAGNESIUM: Magnesium: 2.2 mg/dL (ref 1.6–2.3)

## 2019-12-06 NOTE — Telephone Encounter (Signed)
Rescheduled per provider. Called and spoke with Janace Hoard (niece), confirmed 3/17 appt

## 2019-12-07 ENCOUNTER — Telehealth: Payer: Self-pay

## 2019-12-07 NOTE — Telephone Encounter (Signed)
TC from Pt. Stating that she received a call about a change with her appointment. Pt wanted to know how her niece got the information. Explained to Pt. That her niece is on record as a contact to call. Reconfirmed appointment for 01/17/20

## 2019-12-12 ENCOUNTER — Ambulatory Visit: Payer: PPO

## 2019-12-14 ENCOUNTER — Ambulatory Visit: Payer: PPO

## 2019-12-15 ENCOUNTER — Other Ambulatory Visit: Payer: Self-pay

## 2019-12-15 ENCOUNTER — Ambulatory Visit (HOSPITAL_COMMUNITY): Payer: Medicare Other | Attending: Internal Medicine

## 2019-12-15 DIAGNOSIS — I4891 Unspecified atrial fibrillation: Secondary | ICD-10-CM | POA: Diagnosis not present

## 2019-12-25 ENCOUNTER — Encounter: Payer: Self-pay | Admitting: Adult Health

## 2020-01-02 ENCOUNTER — Telehealth: Payer: Self-pay | Admitting: Cardiovascular Disease

## 2020-01-02 NOTE — Telephone Encounter (Signed)
Patient returning call for echo results. 

## 2020-01-03 NOTE — Telephone Encounter (Signed)
Reviewed lab work and echo results with pt see result notes. Notified that Dr.Kelly would review them in more depth at her upcoming office appt. Pt had question regarding an "awful thing" she went through. Pt states that she had felt like she was going to fall over and she couldn't drive. Her PCP (Dr.Wilson) and placed her on Meclazine 1 tab BID last Artisha Capri and when she had this "spell" a NP increased her dose to 2 tabs BID. Pt states she got better but was unsure if it was because of this medication. She states she heard this was a "dangerous" drug and wants to know if she needs to continue with the increased dose. Notified pt that she needs to contact her PCP concerning this question since he was the original prescriber. She also stated that she is scheduled to receive her COVID vaccine this Saturday and that the woman who called her to schedule this appt asked if she had COPD, pt states that she did not know what this was and that she has SOB. Told pt that when she contacts [facility] regarding her Meclazine concern to also ask about if she was ever diagnosed with COPD and her concern with the COVID vaccine. Pt agreeable and thankful for the help today. Told pt if she had any other questions to contact our office.

## 2020-01-17 ENCOUNTER — Other Ambulatory Visit: Payer: Self-pay

## 2020-01-17 ENCOUNTER — Inpatient Hospital Stay: Payer: Medicare Other | Attending: Adult Health | Admitting: Adult Health

## 2020-01-17 ENCOUNTER — Encounter: Payer: Self-pay | Admitting: Adult Health

## 2020-01-17 VITALS — BP 141/93 | HR 66 | Temp 98.2°F | Resp 18 | Ht 62.0 in | Wt 146.8 lb

## 2020-01-17 DIAGNOSIS — Z170 Estrogen receptor positive status [ER+]: Secondary | ICD-10-CM | POA: Insufficient documentation

## 2020-01-17 DIAGNOSIS — Z7982 Long term (current) use of aspirin: Secondary | ICD-10-CM | POA: Diagnosis not present

## 2020-01-17 DIAGNOSIS — E7800 Pure hypercholesterolemia, unspecified: Secondary | ICD-10-CM | POA: Insufficient documentation

## 2020-01-17 DIAGNOSIS — Z853 Personal history of malignant neoplasm of breast: Secondary | ICD-10-CM | POA: Insufficient documentation

## 2020-01-17 DIAGNOSIS — C50511 Malignant neoplasm of lower-outer quadrant of right female breast: Secondary | ICD-10-CM | POA: Diagnosis not present

## 2020-01-17 DIAGNOSIS — K219 Gastro-esophageal reflux disease without esophagitis: Secondary | ICD-10-CM | POA: Insufficient documentation

## 2020-01-17 DIAGNOSIS — M1990 Unspecified osteoarthritis, unspecified site: Secondary | ICD-10-CM | POA: Diagnosis not present

## 2020-01-17 DIAGNOSIS — Z923 Personal history of irradiation: Secondary | ICD-10-CM | POA: Diagnosis not present

## 2020-01-17 DIAGNOSIS — M8580 Other specified disorders of bone density and structure, unspecified site: Secondary | ICD-10-CM | POA: Insufficient documentation

## 2020-01-17 DIAGNOSIS — I10 Essential (primary) hypertension: Secondary | ICD-10-CM | POA: Diagnosis not present

## 2020-01-17 DIAGNOSIS — Z9223 Personal history of estrogen therapy: Secondary | ICD-10-CM | POA: Diagnosis not present

## 2020-01-17 DIAGNOSIS — Z79899 Other long term (current) drug therapy: Secondary | ICD-10-CM | POA: Diagnosis not present

## 2020-01-17 NOTE — Progress Notes (Signed)
CLINIC:  Survivorship   REASON FOR VISIT:  Routine follow-up for history of breast cancer.   BRIEF ONCOLOGIC HISTORY:  Oncology History  Breast cancer of lower-outer quadrant of right female breast ([place])  11/10/2004 Surgery   Right breast lumpectomy invasive ductal carcinoma grade 1; 0.5 cm 2 sentinel lymph nodes negative ER 85% PR 0% Ki-67 19% HER-2 2+ by IHC and negative by fish, no lymphovascular invasion T1, N0, M0 stage IA   12/29/2004 - 01/26/2005 Radiation Therapy   Radiation therapy to lumpectomy site   02/26/2005 - 02/26/2010 Anti-estrogen oral therapy   Aromasin 25 mg by mouth daily: Side effects including depression.      INTERVAL HISTORY:  Natalie West presents to the Survivorship [facility] today for routine follow-up for her history of breast cancer.She notes she is feeling moderately well.   Natalie West had a difficult time earlier this year with Vertigo.  It has improved.  She is seeing her PCP regularly.  Her mammogram is due tomorrow.  She denies any breast concerns today.  She is up to date with dermatology visits.  She no longer requires colon cancer or GYN cancer screening.      REVIEW OF SYSTEMS:  Review of Systems  Constitutional: Negative for appetite change, chills, fatigue, fever and unexpected weight change.  HENT:   Negative for hearing loss, lump/mass and trouble swallowing.   Eyes: Negative for eye problems and icterus.  Respiratory: Negative for chest tightness, cough and shortness of breath.   Cardiovascular: Negative for chest pain, leg swelling and palpitations.  Gastrointestinal: Negative for abdominal distention, abdominal pain, constipation, diarrhea, nausea and vomiting.  Endocrine: Negative for hot flashes.  Genitourinary: Negative for difficulty urinating.   Musculoskeletal: Negative for arthralgias.  Skin: Negative for itching and rash.  Neurological: Negative for dizziness, extremity weakness, headaches and numbness.  Hematological: Negative for  adenopathy. Does not bruise/bleed easily.  Psychiatric/Behavioral: Negative for depression. The patient is not nervous/anxious.   Breast: Denies any new nodularity, masses, tenderness, nipple changes, or nipple discharge.       PAST MEDICAL/SURGICAL HISTORY:  Past Medical History:  Diagnosis Date  . Anxiety   . Arthritis   . Breast cancer ([place]) 2006   right breast  . Chronic cough   . Chronic UTI   . Depression   . Dysrhythmia   . Esophageal reflux   . Esophageal stricture   . Family history of malignant neoplasm of gastrointestinal tract   . GERD (gastroesophageal reflux disease)   . Hearing loss   . Hemorrhoids    internal  . History of hiatal hernia   . Hypercholesterolemia   . Hypertension   . Numbness and tingling    feeet, occasionally  . Osteopenia   . Personal history of colonic polyps 02/09/2007   TUBULAR ADENOMA  . Personal history of radiation therapy   . PONV (postoperative nausea and vomiting)    trouble opening mouth and jaw, jaw pops, trouble turning head  . Rotator cuff tear    RT  . Status post dilation of esophageal narrowing   . Umbilical hernia    Past Surgical History:  Procedure Laterality Date  . BREAST LUMPECTOMY  2006   right lumpectomy  . CATARACT EXTRACTION W/ INTRAOCULAR LENS IMPLANT Bilateral   . COLONOSCOPY    . CYSTOCELE REPAIR N/A 04/20/2016   Procedure: AUGMENTED ANTERIOR VAULT REPAIR, COLOPLAST DERMIS GRAFT KELLY PLICATION SACROSPINOUS FIXATION;  Surgeon: Carolan Clines, MD;  Location: [facility];  Service: Urology;  Laterality: N/A;  . DILATION AND CURETTAGE OF UTERUS    . ESOPHAGOGASTRODUODENOSCOPY ENDOSCOPY    . PUBOVAGINAL SLING N/A 04/20/2016   Procedure: PUBO-VAGINAL [name];  Surgeon: Carolan Clines, MD;  Location: [facility];  Service: Urology;  Laterality: N/A;  . REVISION URINARY SLING N/A 06/12/2016   Procedure: INCISION OF URETHRAL SLING;  Surgeon: Carolan Clines, MD;  Location: [facility];  Service: Urology;   Laterality: N/A;  1 HOUR  . SHOULDER OPEN ROTATOR CUFF REPAIR Right 02/28/2013   Procedure: RIGHT ROTATOR CUFF REPAIR SHOULDER OPEN WITH GRAFT AND ANCHORS;  Surgeon: Tobi Bastos, MD;  Location: [facility];  Service: Orthopedics;  Laterality: Right;  . UMBILICAL HERNIA REPAIR       ALLERGIES:  Allergies  Allergen Reactions  . Ciprofloxacin Other (See Comments)    Reaction unknown  . Clindamycin/Lincomycin Other (See Comments)    Reaction unknown   . Codeine Nausea And Vomiting  . Diclofenac Other (See Comments)    Reaction unknown   . Fenoprofen Calcium Other (See Comments)    Reaction unknown   . Flexeril [Cyclobenzaprine Hcl] Other (See Comments)    Reaction unknown   . Hydrocodone Other (See Comments)    "Last time it was too strong and messed up my mind"  . Macrobid [Nitrofurantoin] Other (See Comments)    Reaction unknown   . Noroxin [Norfloxacin] Other (See Comments)    Reaction unknown   . Relafen [Nabumetone] Other (See Comments)    Reaction unknown   . Sulfa Antibiotics Other (See Comments)    Reaction unknown      CURRENT MEDICATIONS:  Outpatient Encounter Medications as of 01/17/2020  Medication Sig Note  . acetaminophen (TYLENOL) 325 MG tablet Take 650 mg by mouth every 6 (six) hours as needed for moderate pain.   Marland Kitchen albuterol (PROVENTIL HFA;VENTOLIN HFA) 108 (90 Base) MCG/ACT inhaler Inhale 2 puffs into the lungs every 6 (six) hours as needed for wheezing or shortness of breath.   Marland Kitchen amitriptyline (ELAVIL) 25 MG tablet 25 mg 2 (two) times daily.    Marland Kitchen aspirin EC 81 MG tablet Take 81 mg by mouth daily.   Marland Kitchen atenolol (TENORMIN) 25 MG tablet TAKE ONE TABLET DAILY TO PREVENT PALPITATIONS. 06/10/2016: Takes at hs  . Calcium Carb-Cholecalciferol (CALCIUM 600 + D PO) Take 1 tablet by mouth daily.   . fluticasone (CUTIVATE) 0.005 % ointment Apply 1 application topically daily as needed (itching).   . hydrochlorothiazide (MICROZIDE) 12.5 MG capsule Take 12.5 mg by  mouth daily.   . meclizine (ANTIVERT) 12.5 MG tablet Take 12.5 mg by mouth 2 (two) times daily.   . Multiple Vitamin (MULTIVITAMIN WITH MINERALS) TABS tablet Take 1 tablet by mouth daily.   Marland Kitchen omeprazole (PRILOSEC) 20 MG capsule Take 20 mg by mouth daily as needed (For heartburn or acid reflux.).    Marland Kitchen perphenazine (TRILAFON) 2 MG tablet    . polyethylene glycol powder (GLYCOLAX/MIRALAX) powder Take 17 g by mouth daily.   . pravastatin (PRAVACHOL) 20 MG tablet Take 20 mg by mouth at bedtime.    . bethanechol (URECHOLINE) 25 MG tablet Take 25 mg by mouth 2 (two) times daily.    Facility-Administered Encounter Medications as of 01/17/2020  Medication  . 0.9 %  sodium chloride infusion     ONCOLOGIC FAMILY HISTORY:  Family History  Problem Relation Age of Onset  . Colon cancer Mother 78  . Kidney cancer Mother   . Glaucoma Mother   . Heart  disease Sister   . Stomach cancer Brother   . Stroke Sister   . Lung cancer Brother   . Clotting disorder Son   . Emphysema Father        smoker  . Hypertension Sister        SOCIAL HISTORY:  Natalie West is widowed and lives alone in [place], [place].  She has one son who lives in a trailer behind her house.  Natalie West is currently retired.  She denies any current or history of tobacco, alcohol, or illicit drug use.  (reviewed and unchanged on 01/17/2019)   PHYSICAL EXAMINATION:  Vital Signs: Vitals:   01/17/20 1102  BP: (!) 141/93  Pulse: 66  Resp: 18  Temp: 98.2 F (36.8 C)  SpO2: 95%   Filed Weights   01/17/20 1102  Weight: 146 lb 12.8 oz (66.6 kg)   General: Well-nourished, well-appearing female in no acute distress.  Unaccompanied today.   HEENT: Head is normocephalic.  Pupils equal and reactive to light. Conjunctivae clear without exudate.  Sclerae anicteric. Oral mucosa is pink, moist.  Oropharynx is pink without lesions or erythema.  Lymph: No cervical, supraclavicular, or infraclavicular lymphadenopathy noted on  palpation.  Cardiovascular: Regular rate and rhythm.Marland Kitchen Respiratory: Expiratory wheezing on exam.  Chest expansion symmetric; breathing non-labored.  Breast Exam:  -Left breast: No appreciable masses on palpation. No skin redness, thickening, or peau d'orange appearance;  ;-Right breast: No appreciable masses on palpation. No skin redness, thickening, or peau d'orange appearance; no nipple retraction or nipple discharge; mild distortion in symmetry at previous lumpectomy site well healed scar without erythema or nodularity. -Axilla: No axillary adenopathy bilaterally.  GI: Abdomen soft and round; non-tender, non-distended. Bowel sounds normoactive. No hepatosplenomegaly.   GU: Deferred.  Neuro: No focal deficits. Steady gait.  Psych: Mood and affect normal and appropriate for situation.  MSK: No focal spinal tenderness to palpation, full range of motion in bilateral upper extremities Extremities: No edema. Skin: Warm and dry.    LABORATORY DATA:  None for this visit   DIAGNOSTIC IMAGING:  Most recent mammogram:  Scheduled tomorrow   ASSESSMENT AND PLAN:  Ms.. West is a pleasant 86 y.o. female with history of Stage IA right breast invasive ductal carcinoma, ER+/PR-/HER2-, diagnosed in 11/2004, treated with lumpectomy, adjuvant radiation therapy, and anti-estrogen therapy with Aromasin x 5 years completed in 01/2010.  She presents to the [facility] for surveillance and routine follow-up.   1. History of breast cancer:  Natalie West is currently clinically and radiographically without evidence of disease or recurrence of breast cancer. She will undergo mammogram tomorrow.  We will see her back in one year for LTS follow up.  I encouraged her to call me with any questions or concerns before her next visit at the cancer center, and I would be happy to see her sooner, if needed.    2. Bone health:  Given Natalie West age, history of breast cancer, and her previous anti-estrogen therapy with  Aromasin, she is at risk for bone demineralization. Her last DEXA scan was on 06/03/2017 and demonstrated osteopenia with a t score of -2.4 in the left forearm . Given that she is no longer on Aromasin, I will continue to defer to her PCP regarding bone density testing and management.  She was given education on specific food and activities to promote bone health.  3. Cancer screening:  Due to Natalie West's history and her age, she should receive screening for skin  cancers. She was encouraged to follow-up with her PCP for appropriate cancer screenings.   4. Health maintenance and wellness promotion: Natalie West was encouraged to consume 5-7 servings of fruits and vegetables per day. She was also encouraged to engage in moderate to vigorous exercise for 30 minutes per day most days of the week. She was instructed to limit her alcohol consumption and continue to abstain from tobacco use.    Dispo:  -Return to cancer center in one year for lts follow up -mammogram tomorrow   Total encounter time: 20 minutes*    Gardenia Phlegm, NP [place] [phone]  *Total Encounter Time as defined by the Centers for Medicare and Medicaid Services includes, in addition to the face-to-face time of a patient visit (documented in the note above) non-face-to-face time: obtaining and reviewing outside history, ordering and reviewing medications, tests or procedures, care coordination (communications with other health care professionals or caregivers) and documentation in the medical record.    Note: PRIMARY CARE PROVIDER Christain Sacramento, [place] [phone]

## 2020-01-18 ENCOUNTER — Ambulatory Visit
Admission: RE | Admit: 2020-01-18 | Discharge: 2020-01-18 | Disposition: A | Discharge status: Home / Self Care | Payer: Medicare Other | Source: Ambulatory Visit | Attending: Family Medicine | Admitting: Family Medicine

## 2020-01-18 DIAGNOSIS — Z1231 Encounter for screening mammogram for malignant neoplasm of breast: Secondary | ICD-10-CM

## 2020-01-19 ENCOUNTER — Telehealth: Payer: Self-pay | Admitting: Adult Health

## 2020-01-19 NOTE — Telephone Encounter (Signed)
Scheduled appts per 3/17 los. Pt confirmed new appt date and time.

## 2020-01-23 ENCOUNTER — Encounter: Payer: Self-pay | Admitting: Cardiovascular Disease

## 2020-01-23 ENCOUNTER — Telehealth: Payer: Self-pay

## 2020-01-23 ENCOUNTER — Other Ambulatory Visit: Payer: Self-pay

## 2020-01-23 ENCOUNTER — Ambulatory Visit: Payer: Medicare Other | Admitting: Cardiovascular Disease

## 2020-01-23 VITALS — BP 122/70 | HR 61 | Temp 95.2°F | Ht 62.0 in | Wt 145.0 lb

## 2020-01-23 DIAGNOSIS — I4811 Longstanding persistent atrial fibrillation: Secondary | ICD-10-CM | POA: Diagnosis not present

## 2020-01-23 DIAGNOSIS — M25473 Effusion, unspecified ankle: Secondary | ICD-10-CM

## 2020-01-23 DIAGNOSIS — I272 Pulmonary hypertension, unspecified: Secondary | ICD-10-CM | POA: Diagnosis not present

## 2020-01-23 DIAGNOSIS — E785 Hyperlipidemia, unspecified: Secondary | ICD-10-CM

## 2020-01-23 DIAGNOSIS — I10 Essential (primary) hypertension: Secondary | ICD-10-CM

## 2020-01-23 MED ORDER — ATENOLOL 25 MG PO TABS: 12.5000 mg | ORAL_TABLET | Freq: Every day | ORAL | 3 refills | Status: DC

## 2020-01-23 NOTE — Telephone Encounter (Signed)
While walking pt out from OV with Dr.Kelly, she stated she forgot to mention to Dr.Kelly that at times she has SOB with wheezing. Reports that it isn't all the time and that she is getting the second dose of her covid vaccine this weekend and she thought that maybe this would help. Notified that I would make Dr.Kelly aware and if he was concerned we would let her know. Pt verbalized understanding

## 2020-01-23 NOTE — Patient Instructions (Addendum)
Medication Instructions:  DECREASE YOUR ATENOLOL TO 12.5MG  (1/2 TABLET)  ONLY TAKE YOUR MOBIC (MELOXICAM)  AS NEEDED  *If you need a refill on your cardiac medications before your next appointment, please call your pharmacy*   Follow-Up: At [facility], you and your health needs are our priority.  As part of our continuing mission to provide you with exceptional heart care, we have created designated Provider Care Teams.  These Care Teams include your primary Cardiologist (physician) and Advanced Practice Providers (APPs -  Physician Assistants and Nurse Practitioners) who all work together to provide you with the care you need, when you need it.  We recommend signing up for the patient portal called "MyChart".  Sign up information is provided on this After Visit Summary.  MyChart is used to connect with patients for Virtual Visits (Telemedicine).  Patients are able to view lab/test results, encounter notes, upcoming appointments, etc.  Non-urgent messages can be sent to your provider as well.   To learn more about what you can do with MyChart, go to [email or web address].    Your next appointment:   6 month(s)  The format for your next appointment:   In Person  Provider:   Shelva Majestic, MD

## 2020-01-23 NOTE — Progress Notes (Signed)
Cardiology Office Note    Date:  01/25/2020   ID:  Natalie, West 04/02/1931, MRN [id number]  PCP:  Christain Sacramento, MD  Cardiologist:  Shelva Majestic, MD   F/U  cardiology evaluation referred through the courtesy of Cyndi Bender, PA-C for evaluation of dizziness and atrial fibrillation.  History of Present Illness:  Natalie West is a 86 y.o. female who was evaluated at [facility] by Edgar Frisk, PA-C with complaints of dizziness.  Her ECG showed atrial fibrillation and she was referred for cardiology evaluation.  I saw her for initial evaluation on November 30, 2019.  She presents for 22-monthfollow-up evaluation.  Ms. LBoomhas [facility]  Recently, she has noticed episodes of dizziness which commenced several weeks previously.  She has been started on dextral LA on October 31, 2019 which she discontinued.  Her dizziness resolved the following day but then returned and she has had waxing and waning episodes.  She has felt that she is off balance and feels that she is going to fall.  She tells me that she has had 2 falls in the past but was uncertain as to when these occurred.  She also has noted associated symptoms of some nausea.  She denied abdominal pain, chest pain, awareness of any heart rate irregularity.  She had been tried on meclizine without benefit.  An ECG done November 13, 2019 revealed atrial fibrillation with a ventricular rate at 61 bpm.  Reportedly the patient has a history of atrial fibrillation for which she has been on atenolol but is not aware if this has been permanent atrial fibrillation or paroxysmal.  In 2016, she had [facility] which showed an EF 55 to 60%.  She had normal wall motion.  PA pressure was 39 mm.  Valves were structurally normal.   She has a history of breast cancer, diagnosed in January 2006, treated with  radiation therapy and antiestrogen therapy.  She is felt to be clinically and radiographically free of recurrent disease.  When I saw her for initial evaluation she denied any chest pain.  She continued to experience occasional episodes of lightheadedness and dizziness and her  gait was unbalanced.  During that evaluation it became apparent that she had experienced several falls.  With her unbalanced gait and significant increased risk for recurrent falls after a long discussion I recommended initiation of aspirin therapy alone rather than systemic anticoagulation.  I recommended she undergo a 2D echo Doppler study for assessment of systolic and diastolic function chamber size and valvular architecture.  Also recommended follow-up laboratory.  She had been on pravastatin for hyperlipidemia and her GERD was controlled with omeprazole.  She was taking amitriptyline for depression at bedtime.  Ms. LEvangelistunderwent her echo Doppler study on December 15, 2019.  This showed an EF of 55 to 60%.  There was moderate asymmetric left ventricular hypertrophy.  RV systolic pressure was mildly increased at 37.3.  Laboratory revealed stable hemoglobin hematocrit of 13.3 and 39.1.  Lipid studies are excellent with total cholesterol 123, triglycerides 67 HDL 55 LDL 54.  Serum creatinine was 1.03.  Estimated GFR was 49.  LFTs were normal.  TSH was normal at 2.69 and her magnesium was 2.2.  Over the last several months she has felt fairly well.  She notes mild ankle swelling right greater than left.  She  is unaware of episodes of increased heart rate.  She has not had any recent falls.  She presents for reevaluation.  Past Medical History:  Diagnosis Date  . Anxiety   . Arthritis   . Breast cancer ([place]) 2006   right breast  . Chronic cough   . Chronic UTI   . Depression   . Dysrhythmia   . Esophageal reflux   . Esophageal stricture   . Family history of malignant neoplasm of gastrointestinal tract   . GERD  (gastroesophageal reflux disease)   . Hearing loss   . Hemorrhoids    internal  . History of hiatal hernia   . Hypercholesterolemia   . Hypertension   . Numbness and tingling    feeet, occasionally  . Osteopenia   . Personal history of colonic polyps 02/09/2007   TUBULAR ADENOMA  . Personal history of radiation therapy   . PONV (postoperative nausea and vomiting)    trouble opening mouth and jaw, jaw pops, trouble turning head  . Rotator cuff tear    RT  . Status post dilation of esophageal narrowing   . Umbilical hernia     Past Surgical History:  Procedure Laterality Date  . BREAST LUMPECTOMY  2006   right lumpectomy  . CATARACT EXTRACTION W/ INTRAOCULAR LENS IMPLANT Bilateral   . COLONOSCOPY    . CYSTOCELE REPAIR N/A 04/20/2016   Procedure: AUGMENTED ANTERIOR VAULT REPAIR, COLOPLAST DERMIS GRAFT Alauna Hayden PLICATION SACROSPINOUS FIXATION;  Surgeon: Carolan Clines, MD;  Location: [facility];  Service: Urology;  Laterality: N/A;  . DILATION AND CURETTAGE OF UTERUS    . ESOPHAGOGASTRODUODENOSCOPY ENDOSCOPY    . PUBOVAGINAL SLING N/A 04/20/2016   Procedure: PUBO-VAGINAL [name];  Surgeon: Carolan Clines, MD;  Location: [facility];  Service: Urology;  Laterality: N/A;  . REVISION URINARY SLING N/A 06/12/2016   Procedure: INCISION OF URETHRAL SLING;  Surgeon: Carolan Clines, MD;  Location: [facility];  Service: Urology;  Laterality: N/A;  1 HOUR  . SHOULDER OPEN ROTATOR CUFF REPAIR Right 02/28/2013   Procedure: RIGHT ROTATOR CUFF REPAIR SHOULDER OPEN WITH GRAFT AND ANCHORS;  Surgeon: Tobi Bastos, MD;  Location: [facility];  Service: Orthopedics;  Laterality: Right;  . UMBILICAL HERNIA REPAIR      Current Medications: Outpatient Medications Prior to Visit  Medication Sig Dispense Refill  . acetaminophen (TYLENOL) 325 MG tablet Take 650 mg by mouth every 6 (six) hours as needed for moderate pain.    Marland Kitchen albuterol (PROVENTIL HFA;VENTOLIN HFA) 108 (90 Base) MCG/ACT inhaler Inhale 2 puffs  into the lungs every 6 (six) hours as needed for wheezing or shortness of breath. 1 Inhaler 2  . amitriptyline (ELAVIL) 25 MG tablet 25 mg 2 (two) times daily.     Marland Kitchen aspirin EC 81 MG tablet Take 81 mg by mouth daily.    . Calcium Carb-Cholecalciferol (CALCIUM 600 + D PO) Take 1 tablet by mouth daily.    . fluticasone (CUTIVATE) 0.005 % ointment Apply 1 application topically daily as needed (itching).    . hydrochlorothiazide (MICROZIDE) 12.5 MG capsule Take 12.5 mg by mouth daily.  3  . meclizine (ANTIVERT) 12.5 MG tablet Take 12.5 mg by mouth 2 (two) times daily.    . meloxicam (MOBIC) 7.5 MG tablet Take 7.5 mg by mouth as needed.    . Multiple Vitamin (MULTIVITAMIN WITH MINERALS) TABS tablet Take 1 tablet by mouth daily.    Marland Kitchen omeprazole (PRILOSEC) 20 MG capsule Take 20 mg by mouth  daily as needed (For heartburn or acid reflux.).     Marland Kitchen perphenazine (TRILAFON) 2 MG tablet Take 2 mg by mouth 2 (two) times daily.     . polyethylene glycol powder (GLYCOLAX/MIRALAX) powder Take 17 g by mouth daily.    . pravastatin (PRAVACHOL) 20 MG tablet Take 20 mg by mouth at bedtime.     Marland Kitchen atenolol (TENORMIN) 25 MG tablet TAKE ONE TABLET DAILY TO PREVENT PALPITATIONS.  5  . bethanechol (URECHOLINE) 25 MG tablet Take 25 mg by mouth 2 (two) times daily.     Facility-Administered Medications Prior to Visit  Medication Dose Route Frequency Provider Last Rate Last Admin  . 0.9 %  sodium chloride infusion  500 mL Intravenous Continuous Danis, Estill Cotta III, MD         Allergies:   Ciprofloxacin, Clindamycin/lincomycin, Codeine, Diclofenac, Fenoprofen calcium, Flexeril [cyclobenzaprine hcl], Hydrocodone, Macrobid [nitrofurantoin], Noroxin [norfloxacin], Relafen [nabumetone], and Sulfa antibiotics   Social History   Socioeconomic History  . Marital status: Widowed    Spouse name: Not on file  . Number of children: 1  . Years of education: Not on file  . Highest education level: Not on file  Occupational History    . Occupation: retired    Fish farm manager: RETIRED  Tobacco Use  . Smoking status: Never Smoker  . Smokeless tobacco: Never Used  Substance and Sexual Activity  . Alcohol use: No    Alcohol/week: 0.0 standard drinks  . Drug use: No  . Sexual activity: Never    Partners: Male  Other Topics Concern  . Not on file  Social History Narrative  . Not on file   Social Determinants of Health   Financial Resource Strain:   . Difficulty of Paying Living Expenses:   Food Insecurity:   . Worried About Charity fundraiser in the Last Year:   . Arboriculturist in the Last Year:   Transportation Needs:   . Film/video editor (Medical):   Marland Kitchen Lack of Transportation (Non-Medical):   Physical Activity:   . Days of Exercise per Week:   . Minutes of Exercise per Session:   Stress:   . Feeling of Stress :   Social Connections:   . Frequency of Communication with Friends and Family:   . Frequency of Social Gatherings with Friends and Family:   . Attends Religious Services:   . Active Member of Clubs or Organizations:   . Attends Archivist Meetings:   Marland Kitchen Marital Status:      Family History:  The patient's family history includes Clotting disorder in her son; Colon cancer (age of onset: 93) in her mother; Emphysema in her father; Glaucoma in her mother; Heart disease in her sister; Hypertension in her sister; Kidney cancer in her mother; Lung cancer in her brother; Stomach cancer in her brother; Stroke in her sister.   Her mother died at age 75 and had kidney cancer; father died at age 21 and had emphysema and heart problems, a brother died at age 60 with poor circulation status post bilateral leg amputations, another brother died at age 37 with stomach cancer.  A sister died at age 33 with blood disorder  ROS General: Negative; No fevers, chills, or night sweats;  HEENT: Negative; No changes in vision or hearing, sinus congestion, difficulty swallowing Pulmonary: Negative; No cough,  wheezing, shortness of breath, hemoptysis Cardiovascular: See HPI Positive for ankle swelling GI: Negative; No nausea, vomiting, diarrhea, or abdominal pain GU:  Negative; No dysuria, hematuria, or difficulty voiding Musculoskeletal: positive for arthritis Hematologic/Oncology: Breast cancer survivor Endocrine: Negative; no heat/cold intolerance; no diabetes Neuro: balance issues Skin: Negative; No rashes or skin lesions Psychiatric: positive for depression Sleep: Negative; No snoring, daytime sleepiness, hypersomnolence, bruxism, restless legs, hypnogognic hallucinations, no cataplexy Other comprehensive 14 point system review is negative.   PHYSICAL EXAM:   VS:  BP 122/70 (BP Location: Left Arm, Patient Position: Sitting, Cuff Size: Normal)   Pulse 61   Temp (!) 95.2 F (35.1 C)   Ht 5' 2"  (1.575 m)   Wt 145 lb (65.8 kg)   LMP 11/03/1983   BMI 26.52 kg/m     Repeat blood pressure today by me was 102/60.  Wt Readings from Last 3 Encounters:  01/23/20 145 lb (65.8 kg)  01/17/20 146 lb 12.8 oz (66.6 kg)  11/30/19 150 lb (68 kg)    General: Alert, oriented, no distress.  Skin: normal turgor, no rashes, warm and dry HEENT: Normocephalic, atraumatic. Pupils equal round and reactive to light; sclera anicteric; extraocular muscles intact;  Nose without nasal septal hypertrophy Mouth/Parynx benign; Mallinpatti scale 2 Neck: No JVD, no carotid bruits; normal carotid upstroke Lungs: clear to ausculatation and percussion; no wheezing or rales Chest wall: without tenderness to palpitation Heart: PMI not displaced, irregularly irregular rhythm at 60 bpm , rate controlled A. fib, s1 s2 normal, 1/6 systolic murmur, no diastolic murmur, no rubs, gallops, thrills, or heaves Abdomen: soft, nontender; no hepatosplenomehaly, BS+; abdominal aorta nontender and not dilated by palpation. Back: no CVA tenderness Pulses 2+ Musculoskeletal: full range of motion, normal strength, no joint  deformities Extremities: Trace right ankle edema no clubbing cyanosis or edema, Homan's sign negative  Neurologic: grossly nonfocal; Cranial nerves grossly wnl Psychologic: Normal mood and affect   Studies/Labs Reviewed:   EKG:  EKG is  ordered today.  ECG (independently read by me): Atrial fibrillation at 69 bpm.  Poor anterior R wave progression.  Low voltage. QTc interval 446 ms  November 29, 2018 ECG (independently read by me): Atrial fibrillation at 70 bpm; QTc 460 ms  Recent Labs: BMP Latest Ref Rng & Units 12/06/2019 06/10/2016 04/21/2016  Glucose 65 - 99 mg/dL 86 92 197(H)  BUN 8 - 27 mg/dL 14 22(H) 15  Creatinine 0.57 - 1.00 mg/dL 1.03(H) 0.89 0.82  BUN/Creat Ratio 12 - 28 14 - -  Sodium 134 - 144 mmol/L 142 140 133(L)  Potassium 3.5 - 5.2 mmol/L 4.5 3.8 3.4(L)  Chloride 96 - 106 mmol/L 103 105 102  CO2 20 - 29 mmol/L 25 29 25   Calcium 8.7 - 10.3 mg/dL 9.2 9.1 8.0(L)     Hepatic Function Latest Ref Rng & Units 12/06/2019 06/28/2014 03/31/2013  Total Protein 6.0 - 8.5 g/dL 6.4 6.6 6.6  Albumin 3.6 - 4.6 g/dL 4.2 3.7 3.5  AST 0 - 40 IU/L 20 25 17   ALT 0 - 32 IU/L 8 16 8   Alk Phosphatase 39 - 117 IU/L 88 85 102  Total Bilirubin 0.0 - 1.2 mg/dL 0.5 0.47 0.49    CBC Latest Ref Rng & Units 12/06/2019 06/10/2016 04/21/2016  WBC 3.4 - 10.8 x10E3/uL 7.0 6.9 12.6(H)  Hemoglobin 11.1 - 15.9 g/dL 13.3 12.0 9.7(L)  Hematocrit 34.0 - 46.6 % 39.1 37.1 29.6(L)  Platelets 150 - 450 x10E3/uL 198 196 139(L)   Lab Results  Component Value Date   MCV 95 12/06/2019   MCV 96.1 06/10/2016   MCV 96.4 04/21/2016   Lab Results  Component Value Date   TSH 2.690 12/06/2019   No results found for: HGBA1C   BNP No results found for: BNP  ProBNP No results found for: PROBNP   Lipid Panel     Component Value Date/Time   CHOL 123 12/06/2019 1036   TRIG 67 12/06/2019 1036   HDL 55 12/06/2019 1036   CHOLHDL 2.2 12/06/2019 1036   LDLCALC 54 12/06/2019 1036   LABVLDL 14 12/06/2019 1036      RADIOLOGY: MM 3D SCREEN BREAST BILATERAL  Result Date: 01/19/2020 CLINICAL DATA:  Screening. History of RIGHT breast cancer and lumpectomy in 2006. EXAM: DIGITAL SCREENING BILATERAL MAMMOGRAM WITH TOMO AND CAD COMPARISON:  Previous exam(s). ACR Breast Density Category b: There are scattered areas of fibroglandular density. FINDINGS: There are no findings suspicious for malignancy. RIGHT lumpectomy again noted. Images were processed with CAD. IMPRESSION: No mammographic evidence of malignancy. A result letter of this screening mammogram will be mailed directly to the patient. RECOMMENDATION: Screening mammogram in one year. (Code:SM-B-01Y) BI-RADS CATEGORY  2: Benign. Electronically Signed   By: Margarette Canada M.D.   On: 01/19/2020 15:56     Additional studies/ records that were reviewed today include:   I reviewed the records from [facility] practice.  I reviewed a prior echo Doppler study from November 15, 2014    ASSESSMENT:    1. Longstanding persistent atrial fibrillation ([place])   2. Essential hypertension   3. Mild pulmonary hypertension [facility] Echo November 15, 2014   4. Ankle edema   5. Hyperlipidemia, unspecified hyperlipidemia type     PLAN:  Natalie West is an 86 year old female who has a history of breast CA and underwent successful chemotherapy and antiestrogen therapy and is now felt to be in complete remission.  There is  history of atrial fibrillation for which she has been on atenolol.  When I initially saw her I did not have any records regarding whether this was paroxysmal or permanent atrial fibrillation. Recently, she had noticed episodes of dizziness and lightheadedness.  She had fallen on at least 2 occasions but could not recall exactly when these falls occurred but they had not occurred within the recent month.  She had issues with balance and had been started on Antivert.  She also had been on dextral LA which ultimately was stopped.  Her ECG from November 12, 2018 at [facility] demonstrated atrial fibrillation at 61 bpm.  During her initial evaluation I spent considerable time with her and discussed the risks and benefits of anticoagulation in detail.  I had concerns with her recent falls and unsteady balance increasing her likelihood for recurrent falls and potential bleeding if systemic anticoagulation was initiated.  As result she has been on aspirin therapy and seems to tolerate this well.  I reviewed her most recent echo Doppler study with her in detail which reveals normal systolic function with moderate asymmetric left ventricular hypertrophy.  She did not have any regional wall motion abnormalities.  Her left atrial size was severely dilated and it is highly likely that her atrial fibrillation has been longstanding.  She had mild pulmonary hypertension with estimated RV pressure at 37 mm.  Her blood pressure today is low at 100/60.  I have recommended reduction of her atenolol dose from 25 mg daily down to 12.5 mg.  She has a prescription for hydrochlorothiazide which she has been taking 12.5 mg daily, she may need to change this to as needed.  She has been  taking meloxicam 7.5 mg daily and I discussed with her potential risk including renal insufficiency as well as bleeding and have suggested she change this to as needed.  In addition to amitriptyline she also takes Trilafon for depression.  She continues to be on simvastatin for hyperlipidemia.  I will see her in 4 to 6 months for reevaluation or sooner as needed.    Medication Adjustments/Labs and Tests Ordered: Current medicines are reviewed at length with the patient today.  Concerns regarding medicines are outlined above.  Medication changes, Labs and Tests ordered today are listed in the Patient Instructions below. Patient Instructions  Medication Instructions:  DECREASE YOUR ATENOLOL TO 12.5MG (1/2 TABLET)  ONLY TAKE YOUR MOBIC (MELOXICAM)  AS NEEDED  *If you need a refill on  your cardiac medications before your next appointment, please call your pharmacy*   Follow-Up: At [facility], you and your health needs are our priority.  As part of our continuing mission to provide you with exceptional heart care, we have created designated Provider Care Teams.  These Care Teams include your primary Cardiologist (physician) and Advanced Practice Providers (APPs -  Physician Assistants and Nurse Practitioners) who all work together to provide you with the care you need, when you need it.  We recommend signing up for the patient portal called "MyChart".  Sign up information is provided on this After Visit Summary.  MyChart is used to connect with patients for Virtual Visits (Telemedicine).  Patients are able to view lab/test results, encounter notes, upcoming appointments, etc.  Non-urgent messages can be sent to your provider as well.   To learn more about what you can do with MyChart, go to [email or web address].    Your next appointment:   6 month(s)  The format for your next appointment:   In Person  Provider:   Shelva Majestic, MD      Signed, Shelva Majestic, MD  01/25/2020 1:59 PM    [facility] [address] [place], [place], [place]  [address] Phone: [phone]

## 2020-01-24 NOTE — Telephone Encounter (Signed)
Covered in [place] on 01/23/20 with dr.kelly

## 2020-01-25 ENCOUNTER — Encounter: Payer: Self-pay | Admitting: Cardiovascular Disease

## 2020-04-15 ENCOUNTER — Other Ambulatory Visit: Payer: Self-pay

## 2020-04-15 ENCOUNTER — Encounter (INDEPENDENT_AMBULATORY_CARE_PROVIDER_SITE_OTHER): Payer: Medicare Other | Admitting: Ophthalmology

## 2020-04-15 DIAGNOSIS — H35033 Hypertensive retinopathy, bilateral: Secondary | ICD-10-CM | POA: Diagnosis not present

## 2020-04-15 DIAGNOSIS — I10 Essential (primary) hypertension: Secondary | ICD-10-CM

## 2020-04-15 DIAGNOSIS — H43813 Vitreous degeneration, bilateral: Secondary | ICD-10-CM | POA: Diagnosis not present

## 2020-04-15 DIAGNOSIS — H318 Other specified disorders of choroid: Secondary | ICD-10-CM

## 2020-04-23 ENCOUNTER — Other Ambulatory Visit: Payer: Self-pay

## 2020-04-23 ENCOUNTER — Encounter (INDEPENDENT_AMBULATORY_CARE_PROVIDER_SITE_OTHER): Payer: Medicare Other | Admitting: Ophthalmology

## 2020-04-23 DIAGNOSIS — H3561 Retinal hemorrhage, right eye: Secondary | ICD-10-CM | POA: Diagnosis not present

## 2020-05-20 ENCOUNTER — Other Ambulatory Visit: Payer: Self-pay

## 2020-05-20 ENCOUNTER — Encounter (INDEPENDENT_AMBULATORY_CARE_PROVIDER_SITE_OTHER): Payer: Medicare Other | Admitting: Ophthalmology

## 2020-05-20 DIAGNOSIS — H353122 Nonexudative age-related macular degeneration, left eye, intermediate dry stage: Secondary | ICD-10-CM

## 2020-05-20 DIAGNOSIS — H353211 Exudative age-related macular degeneration, right eye, with active choroidal neovascularization: Secondary | ICD-10-CM | POA: Diagnosis not present

## 2020-07-23 NOTE — Progress Notes (Signed)
Virtual Visit via Telephone Note   This visit type was conducted due to national recommendations for restrictions regarding the COVID-19 Pandemic (e.g. social distancing) in an effort to limit this patient's exposure and mitigate transmission in our community.  Due to her co-morbid illnesses, this patient is at least at moderate risk for complications without adequate follow up.  This format is felt to be most appropriate for this patient at this time.  The patient did not have access to video technology/had technical difficulties with video requiring transitioning to audio format only (telephone).  All issues noted in this document were discussed and addressed.  No physical exam could be performed with this format.  Please refer to the patient's chart for her  consent to telehealth for [facility].    Date:  07/24/2020   ID:  Natalie West, DOB [birth year], MRN [id number] The patient was identified using 2 identifiers.  Patient Location: Home Provider Location: Office/Clinic  PCP:  Christain Sacramento, MD  Cardiologist:  Shelva Majestic, MD  Electrophysiologist:  None   Evaluation Performed:  Follow-Up Visit  Chief Complaint:  PAF  History of Present Illness:    Natalie West is a 86 y.o. female with paroxysmal atrial fibrillation, falls, episodes of dizziness, HTN, HLD, GERD, anxiety, and hx of breast cancer. In review of her falls, Dr. Claiborne Billings and the patient decided to forgo anticoagulation and initiated ASA alone. Echo 12/2019 revealed normal EF 55-60%, moderate asymmetric left ventricular hypertrophy, and mildly elevated PA pressure 37 mmHg. She was last seen by Dr. Claiborne Billings on 01/03/20. BP was marginal and her atenolol was reduced to 12.5 mg. She was still taking 12.5 mg HCTZ. Dr. Claiborne Billings also discussed potential side effects of meloxicam.   She presents today via virtual visit for planned follow up. She is doing well on reduced atenolol. She had one night with fast breathing, but has not had a  recurrence of this. No chest pain. Meclizine has controlled her dizziness. She reports 2 falls since her last visit. She is frustrated by not being able to get up after falls.  She reports swelling in her right angle that is baseline for her since a fall. Overall, sounds like she is doing OK from a cardiac perspective.  The patient does not have symptoms concerning for COVID-19 infection (fever, chills, cough, or new shortness of breath).    Past Medical History:  Diagnosis Date  . Anxiety   . Arthritis   . Breast cancer ([place]) 2006   right breast  . Chronic cough   . Chronic UTI   . Depression   . Dysrhythmia   . Esophageal reflux   . Esophageal stricture   . Family history of malignant neoplasm of gastrointestinal tract   . GERD (gastroesophageal reflux disease)   . Hearing loss   . Hemorrhoids    internal  . History of hiatal hernia   . Hypercholesterolemia   . Hypertension   . Numbness and tingling    feeet, occasionally  . Osteopenia   . Personal history of colonic polyps 02/09/2007   TUBULAR ADENOMA  . Personal history of radiation therapy   . PONV (postoperative nausea and vomiting)    trouble opening mouth and jaw, jaw pops, trouble turning head  . Rotator cuff tear    RT  . Status post dilation of esophageal narrowing   . Umbilical hernia    Past Surgical History:  Procedure Laterality Date  . BREAST LUMPECTOMY  2006  right lumpectomy  . CATARACT EXTRACTION W/ INTRAOCULAR LENS IMPLANT Bilateral   . COLONOSCOPY    . CYSTOCELE REPAIR N/A 04/20/2016   Procedure: AUGMENTED ANTERIOR VAULT REPAIR, COLOPLAST DERMIS GRAFT KELLY PLICATION SACROSPINOUS FIXATION;  Surgeon: Carolan Clines, MD;  Location: [facility];  Service: Urology;  Laterality: N/A;  . DILATION AND CURETTAGE OF UTERUS    . ESOPHAGOGASTRODUODENOSCOPY ENDOSCOPY    . PUBOVAGINAL SLING N/A 04/20/2016   Procedure: PUBO-VAGINAL [name];  Surgeon: Carolan Clines, MD;  Location: [facility];  Service: Urology;   Laterality: N/A;  . REVISION URINARY SLING N/A 06/12/2016   Procedure: INCISION OF URETHRAL SLING;  Surgeon: Carolan Clines, MD;  Location: [facility];  Service: Urology;  Laterality: N/A;  1 HOUR  . SHOULDER OPEN ROTATOR CUFF REPAIR Right 02/28/2013   Procedure: RIGHT ROTATOR CUFF REPAIR SHOULDER OPEN WITH GRAFT AND ANCHORS;  Surgeon: Tobi Bastos, MD;  Location: [facility];  Service: Orthopedics;  Laterality: Right;  . UMBILICAL HERNIA REPAIR       Current Meds  Medication Sig  . acetaminophen (TYLENOL) 325 MG tablet Take 650 mg by mouth every 6 (six) hours as needed for moderate pain.  Marland Kitchen albuterol (PROVENTIL HFA;VENTOLIN HFA) 108 (90 Base) MCG/ACT inhaler Inhale 2 puffs into the lungs every 6 (six) hours as needed for wheezing or shortness of breath.  Marland Kitchen amitriptyline (ELAVIL) 25 MG tablet 25 mg 2 (two) times daily.   Marland Kitchen aspirin EC 81 MG tablet Take 81 mg by mouth daily.  Marland Kitchen atenolol (TENORMIN) 25 MG tablet Take 0.5 tablets (12.5 mg total) by mouth daily.  . Calcium Carb-Cholecalciferol (CALCIUM 600 + D PO) Take 1 tablet by mouth daily.  . fluticasone (CUTIVATE) 0.005 % ointment Apply 1 application topically daily as needed (itching).  . hydrochlorothiazide (MICROZIDE) 12.5 MG capsule Take 12.5 mg by mouth daily.  . meclizine (ANTIVERT) 12.5 MG tablet Take 12.5 mg by mouth 2 (two) times daily.  . meloxicam (MOBIC) 7.5 MG tablet Take 7.5 mg by mouth as needed.  . Multiple Vitamin (MULTIVITAMIN WITH MINERALS) TABS tablet Take 1 tablet by mouth daily.  Marland Kitchen omeprazole (PRILOSEC) 20 MG capsule Take 20 mg by mouth daily as needed (For heartburn or acid reflux.).   Marland Kitchen perphenazine (TRILAFON) 2 MG tablet Take 2 mg by mouth 2 (two) times daily.   . polyethylene glycol powder (GLYCOLAX/MIRALAX) powder Take 17 g by mouth daily.  . pravastatin (PRAVACHOL) 20 MG tablet Take 20 mg by mouth at bedtime.    Current Facility-Administered Medications for the 07/24/20 encounter (Telemedicine) with Ledora Bottcher, PA  Medication  . 0.9 %  sodium chloride infusion     Allergies:   Ciprofloxacin, Clindamycin/lincomycin, Codeine, Diclofenac, Fenoprofen calcium, Flexeril [cyclobenzaprine hcl], Hydrocodone, Macrobid [nitrofurantoin], Noroxin [norfloxacin], Relafen [nabumetone], and Sulfa antibiotics   Social History   Tobacco Use  . Smoking status: Never Smoker  . Smokeless tobacco: Never Used  Substance Use Topics  . Alcohol use: No    Alcohol/week: 0.0 standard drinks  . Drug use: No     Family Hx: The patient's family history includes Clotting disorder in her son; Colon cancer (age of onset: 103) in her mother; Emphysema in her father; Glaucoma in her mother; Heart disease in her sister; Hypertension in her sister; Kidney cancer in her mother; Lung cancer in her brother; Stomach cancer in her brother; Stroke in her sister.  ROS:   Please see the history of present illness.     All other systems  reviewed and are negative.   Prior CV studies:   The following studies were reviewed today:  Echo 12/15/19: 1. Left ventricular ejection fraction, by estimation, is 55 to 60%. The  left ventricle has normal function. The left ventricle has no regional  wall motion abnormalities. There is moderately increased asymmetric left  ventricular hypertrophy. Left  ventricular diastolic parameters are indeterminate.  2. Right ventricular systolic function is normal. The right ventricular  size is mildly enlarged. There is mildly elevated pulmonary artery  systolic pressure. The estimated right ventricular systolic pressure is  09.4 mmHg.  3. Left atrial size was severely dilated.  4. The mitral valve is normal in structure and function. Trivial mitral  valve regurgitation. No evidence of mitral stenosis.  5. The aortic valve is tricuspid. Aortic valve regurgitation is mild. No  aortic stenosis is present.  6. The inferior vena cava is normal in size with greater than 50%  respiratory  variability, suggesting right atrial pressure of 3 mmHg.  Labs/Other Tests and Data Reviewed:    EKG:  No ECG reviewed.  Recent Labs: 12/06/2019: ALT 8; BUN 14; Creatinine, Ser 1.03; Hemoglobin 13.3; Magnesium 2.2; Platelets 198; Potassium 4.5; Sodium 142; TSH 2.690   Recent Lipid Panel Lab Results  Component Value Date/Time   CHOL 123 12/06/2019 10:36 AM   TRIG 67 12/06/2019 10:36 AM   HDL 55 12/06/2019 10:36 AM   CHOLHDL 2.2 12/06/2019 10:36 AM   LDLCALC 54 12/06/2019 10:36 AM    Wt Readings from Last 3 Encounters:  07/24/20 144 lb 9.6 oz (65.6 kg)  01/23/20 145 lb (65.8 kg)  01/17/20 146 lb 12.8 oz (66.6 kg)     Objective:    Vital Signs:  BP 123/79   Pulse 77   Ht 5\' 2"  (1.575 m)   Wt 144 lb 9.6 oz (65.6 kg)   LMP 11/03/1983   BMI 26.45 kg/m    VITAL SIGNS:  reviewed GEN:  no acute distress RESPIRATORY:  respirations unlabored NEURO:  alert and oriented x 3, no obvious focal deficit PSYCH:  normal affect  ASSESSMENT & PLAN:    Atrial fibrillation - continue BB - continue ASA  - no anticoagulation given hx of dizziness and falls This patients CHA2DS2-VASc Score and unadjusted Ischemic Stroke Rate (% per year) is equal to 4.8 % stroke rate/year from a score of 4 (female, 2age, HTN)   Hx of dizziness and falls - 2 falls since last visit, no head injury - continues meclizine   Hypertension - pressure well-controlled on present regimen   Hyperlipidemia 12/06/2019: Cholesterol, Total 123; HDL 55; LDL Chol Calc (NIH) 54; Triglycerides 67 - continue statin, excellent LDL control   Follow up with Dr. Claiborne Billings.     COVID-19 Education: The signs and symptoms of COVID-19 were discussed with the patient and how to seek care for testing (follow up with PCP or arrange E-visit).  The importance of social distancing was discussed today.  Time:   Today, I have spent 20 minutes with the patient with telehealth technology discussing the above problems.      Medication Adjustments/Labs and Tests Ordered: Current medicines are reviewed at length with the patient today.  Concerns regarding medicines are outlined above.   Tests Ordered: No orders of the defined types were placed in this encounter.   Medication Changes: No orders of the defined types were placed in this encounter.   Follow Up:  Virtual Visit  in 5 month(s)  Signed, Ledora Bottcher,  PA  07/24/2020 12:24 PM    [facility]

## 2020-07-24 ENCOUNTER — Encounter: Payer: Self-pay | Admitting: Physician Assistant

## 2020-07-24 ENCOUNTER — Telehealth (INDEPENDENT_AMBULATORY_CARE_PROVIDER_SITE_OTHER): Payer: Medicare Other | Admitting: Physician Assistant

## 2020-07-24 ENCOUNTER — Ambulatory Visit: Payer: Medicare Other | Admitting: Physician Assistant

## 2020-07-24 VITALS — BP 123/79 | HR 77 | Ht 62.0 in | Wt 144.6 lb

## 2020-07-24 DIAGNOSIS — I4811 Longstanding persistent atrial fibrillation: Secondary | ICD-10-CM | POA: Diagnosis not present

## 2020-07-24 DIAGNOSIS — R42 Dizziness and giddiness: Secondary | ICD-10-CM

## 2020-07-24 DIAGNOSIS — W19XXXD Unspecified fall, subsequent encounter: Secondary | ICD-10-CM

## 2020-07-24 DIAGNOSIS — E785 Hyperlipidemia, unspecified: Secondary | ICD-10-CM

## 2020-07-24 DIAGNOSIS — I10 Essential (primary) hypertension: Secondary | ICD-10-CM

## 2020-07-24 NOTE — Patient Instructions (Signed)
Medication Instructions:  Your physician recommends that you continue on your current medications as directed. Please refer to the Current Medication list given to you today.  *If you need a refill on your cardiac medications before your next appointment, please call your pharmacy*   Follow-Up: At [facility], you and your health needs are our priority.  As part of our continuing mission to provide you with exceptional heart care, we have created designated Provider Care Teams.  These Care Teams include your primary Cardiologist (physician) and Advanced Practice Providers (APPs -  Physician Assistants and Nurse Practitioners) who all work together to provide you with the care you need, when you need it.  We recommend signing up for the patient portal called "MyChart".  Sign up information is provided on this After Visit Summary.  MyChart is used to connect with patients for Virtual Visits (Telemedicine).  Patients are able to view lab/test results, encounter notes, upcoming appointments, etc.  Non-urgent messages can be sent to your provider as well.   To learn more about what you can do with MyChart, go to [email or web address].    Your next appointment:   6 month(s)  The format for your next appointment:   In Person or Virtual  Provider:   You may see Shelva Majestic, MD or one of the following Advanced Practice Providers on your designated Care Team:    Almyra Deforest, PA-C  Fabian Sharp, [place] or   Roby Lofts, [place]    Other Instructions Please call our office to schedule your follow-up appointment 2 months in advance. (Call in January for a March appointment)

## 2020-08-21 ENCOUNTER — Other Ambulatory Visit: Payer: Self-pay

## 2020-08-21 ENCOUNTER — Encounter (INDEPENDENT_AMBULATORY_CARE_PROVIDER_SITE_OTHER): Payer: Medicare Other | Admitting: Ophthalmology

## 2020-08-21 DIAGNOSIS — H34832 Tributary (branch) retinal vein occlusion, left eye, with macular edema: Secondary | ICD-10-CM | POA: Diagnosis not present

## 2020-08-21 DIAGNOSIS — H353211 Exudative age-related macular degeneration, right eye, with active choroidal neovascularization: Secondary | ICD-10-CM | POA: Diagnosis not present

## 2020-08-21 DIAGNOSIS — H35033 Hypertensive retinopathy, bilateral: Secondary | ICD-10-CM

## 2020-08-21 DIAGNOSIS — I10 Essential (primary) hypertension: Secondary | ICD-10-CM

## 2020-08-21 DIAGNOSIS — H43813 Vitreous degeneration, bilateral: Secondary | ICD-10-CM

## 2020-10-02 ENCOUNTER — Encounter (INDEPENDENT_AMBULATORY_CARE_PROVIDER_SITE_OTHER): Payer: Medicare Other | Admitting: Ophthalmology

## 2020-10-02 ENCOUNTER — Other Ambulatory Visit: Payer: Self-pay

## 2020-10-02 DIAGNOSIS — H35033 Hypertensive retinopathy, bilateral: Secondary | ICD-10-CM | POA: Diagnosis not present

## 2020-10-02 DIAGNOSIS — H43813 Vitreous degeneration, bilateral: Secondary | ICD-10-CM

## 2020-10-02 DIAGNOSIS — H353211 Exudative age-related macular degeneration, right eye, with active choroidal neovascularization: Secondary | ICD-10-CM

## 2020-10-02 DIAGNOSIS — I10 Essential (primary) hypertension: Secondary | ICD-10-CM

## 2020-10-02 DIAGNOSIS — H34832 Tributary (branch) retinal vein occlusion, left eye, with macular edema: Secondary | ICD-10-CM

## 2020-11-06 ENCOUNTER — Encounter (INDEPENDENT_AMBULATORY_CARE_PROVIDER_SITE_OTHER): Payer: Medicare Other | Admitting: Ophthalmology

## 2020-11-06 ENCOUNTER — Encounter (INDEPENDENT_AMBULATORY_CARE_PROVIDER_SITE_OTHER): Payer: Medicare HMO | Admitting: Ophthalmology

## 2020-11-06 ENCOUNTER — Other Ambulatory Visit: Payer: Self-pay

## 2020-11-06 DIAGNOSIS — H43813 Vitreous degeneration, bilateral: Secondary | ICD-10-CM

## 2020-11-06 DIAGNOSIS — I10 Essential (primary) hypertension: Secondary | ICD-10-CM | POA: Diagnosis not present

## 2020-11-06 DIAGNOSIS — H35033 Hypertensive retinopathy, bilateral: Secondary | ICD-10-CM

## 2020-11-06 DIAGNOSIS — H353211 Exudative age-related macular degeneration, right eye, with active choroidal neovascularization: Secondary | ICD-10-CM

## 2020-11-06 DIAGNOSIS — H34832 Tributary (branch) retinal vein occlusion, left eye, with macular edema: Secondary | ICD-10-CM | POA: Diagnosis not present

## 2020-11-07 ENCOUNTER — Encounter (INDEPENDENT_AMBULATORY_CARE_PROVIDER_SITE_OTHER): Payer: Medicare Other | Admitting: Ophthalmology

## 2020-11-12 ENCOUNTER — Encounter (INDEPENDENT_AMBULATORY_CARE_PROVIDER_SITE_OTHER): Payer: Medicare Other | Admitting: Ophthalmology

## 2020-11-12 ENCOUNTER — Encounter (INDEPENDENT_AMBULATORY_CARE_PROVIDER_SITE_OTHER): Payer: Medicare HMO | Admitting: Ophthalmology

## 2020-12-11 ENCOUNTER — Encounter (INDEPENDENT_AMBULATORY_CARE_PROVIDER_SITE_OTHER): Payer: Medicare HMO | Admitting: Ophthalmology

## 2020-12-11 ENCOUNTER — Other Ambulatory Visit: Payer: Self-pay

## 2020-12-11 DIAGNOSIS — H34832 Tributary (branch) retinal vein occlusion, left eye, with macular edema: Secondary | ICD-10-CM

## 2020-12-11 DIAGNOSIS — I10 Essential (primary) hypertension: Secondary | ICD-10-CM

## 2020-12-11 DIAGNOSIS — H35033 Hypertensive retinopathy, bilateral: Secondary | ICD-10-CM

## 2020-12-11 DIAGNOSIS — H353211 Exudative age-related macular degeneration, right eye, with active choroidal neovascularization: Secondary | ICD-10-CM

## 2020-12-11 DIAGNOSIS — H43813 Vitreous degeneration, bilateral: Secondary | ICD-10-CM

## 2020-12-20 ENCOUNTER — Other Ambulatory Visit: Payer: Self-pay | Admitting: Family Medicine

## 2020-12-20 ENCOUNTER — Other Ambulatory Visit: Payer: Self-pay | Admitting: Emergency Medicine

## 2020-12-20 DIAGNOSIS — Z0000 Encounter for general adult medical examination without abnormal findings: Secondary | ICD-10-CM

## 2021-01-06 ENCOUNTER — Telehealth: Payer: Self-pay | Admitting: Adult Health

## 2021-01-06 NOTE — Telephone Encounter (Signed)
Called pt to reschedule LTS visit per [facility] schedule change. Pt did not answer home phone (no voicemail) and mobile number on file is pt's nieces. Pt's niece stated that it would be better to cancel the appt and she would let her aunt know to call and reschedule.

## 2021-01-15 ENCOUNTER — Other Ambulatory Visit: Payer: Self-pay

## 2021-01-15 ENCOUNTER — Encounter (INDEPENDENT_AMBULATORY_CARE_PROVIDER_SITE_OTHER): Payer: Medicare HMO | Admitting: Ophthalmology

## 2021-01-15 DIAGNOSIS — H35033 Hypertensive retinopathy, bilateral: Secondary | ICD-10-CM

## 2021-01-15 DIAGNOSIS — H353211 Exudative age-related macular degeneration, right eye, with active choroidal neovascularization: Secondary | ICD-10-CM | POA: Diagnosis not present

## 2021-01-15 DIAGNOSIS — I10 Essential (primary) hypertension: Secondary | ICD-10-CM

## 2021-01-15 DIAGNOSIS — H43813 Vitreous degeneration, bilateral: Secondary | ICD-10-CM

## 2021-01-15 DIAGNOSIS — H34832 Tributary (branch) retinal vein occlusion, left eye, with macular edema: Secondary | ICD-10-CM | POA: Diagnosis not present

## 2021-01-17 ENCOUNTER — Ambulatory Visit (HOSPITAL_COMMUNITY)
Admission: RE | Admit: 2021-01-17 | Discharge: 2021-01-17 | Disposition: A | Discharge status: Home / Self Care | Payer: Medicare HMO | Source: Ambulatory Visit | Attending: Adult Health | Admitting: Adult Health

## 2021-01-17 ENCOUNTER — Inpatient Hospital Stay: Payer: Medicare HMO | Attending: Adult Health | Admitting: Adult Health

## 2021-01-17 ENCOUNTER — Telehealth: Payer: Self-pay | Admitting: Adult Health

## 2021-01-17 ENCOUNTER — Other Ambulatory Visit: Payer: Self-pay

## 2021-01-17 ENCOUNTER — Encounter: Payer: Self-pay | Admitting: Adult Health

## 2021-01-17 VITALS — BP 133/75 | HR 69 | Temp 97.7°F | Resp 16 | Ht 62.0 in | Wt 144.4 lb

## 2021-01-17 DIAGNOSIS — Z7982 Long term (current) use of aspirin: Secondary | ICD-10-CM | POA: Insufficient documentation

## 2021-01-17 DIAGNOSIS — Z170 Estrogen receptor positive status [ER+]: Secondary | ICD-10-CM

## 2021-01-17 DIAGNOSIS — I10 Essential (primary) hypertension: Secondary | ICD-10-CM | POA: Diagnosis not present

## 2021-01-17 DIAGNOSIS — Z801 Family history of malignant neoplasm of trachea, bronchus and lung: Secondary | ICD-10-CM | POA: Insufficient documentation

## 2021-01-17 DIAGNOSIS — Z79899 Other long term (current) drug therapy: Secondary | ICD-10-CM | POA: Diagnosis not present

## 2021-01-17 DIAGNOSIS — C50511 Malignant neoplasm of lower-outer quadrant of right female breast: Secondary | ICD-10-CM | POA: Insufficient documentation

## 2021-01-17 DIAGNOSIS — Z923 Personal history of irradiation: Secondary | ICD-10-CM | POA: Insufficient documentation

## 2021-01-17 DIAGNOSIS — K219 Gastro-esophageal reflux disease without esophagitis: Secondary | ICD-10-CM | POA: Diagnosis not present

## 2021-01-17 DIAGNOSIS — Z853 Personal history of malignant neoplasm of breast: Secondary | ICD-10-CM | POA: Insufficient documentation

## 2021-01-17 DIAGNOSIS — Z791 Long term (current) use of non-steroidal anti-inflammatories (NSAID): Secondary | ICD-10-CM | POA: Diagnosis not present

## 2021-01-17 DIAGNOSIS — M1990 Unspecified osteoarthritis, unspecified site: Secondary | ICD-10-CM | POA: Insufficient documentation

## 2021-01-17 DIAGNOSIS — E7800 Pure hypercholesterolemia, unspecified: Secondary | ICD-10-CM | POA: Insufficient documentation

## 2021-01-17 DIAGNOSIS — M8580 Other specified disorders of bone density and structure, unspecified site: Secondary | ICD-10-CM | POA: Diagnosis not present

## 2021-01-17 NOTE — Progress Notes (Signed)
CLINIC:  Survivorship   REASON FOR VISIT:  Routine follow-up for history of breast cancer.   BRIEF ONCOLOGIC HISTORY:  Oncology History  Breast cancer of lower-outer quadrant of right female breast ([place])  11/10/2004 Surgery   Right breast lumpectomy invasive ductal carcinoma grade 1; 0.5 cm 2 sentinel lymph nodes negative ER 85% PR 0% Ki-67 19% HER-2 2+ by IHC and negative by fish, no lymphovascular invasion T1, N0, M0 stage IA   12/29/2004 - 01/26/2005 Radiation Therapy   Radiation therapy to lumpectomy site   02/26/2005 - 02/26/2010 Anti-estrogen oral therapy   Aromasin 25 mg by mouth daily: Side effects including depression.      INTERVAL HISTORY:  Natalie West presents to the Survivorship [facility] today for routine follow-up for her history of breast cancer.She notes she is feeling moderately well.   Natalie West underwent a bilateral breast screening mammogram on 01/19/2020 that showed no evidence of malignancy and breast density category B.  She is scheduled for repeat next month.  Natalie West continues to live alone, she requires eye injections every 5 weeks.  She is looking into getting some nursing assistance for her son who lives in a trailer behind her home.  She overall is feeling well and has stayed up to date with her PCP visits.   REVIEW OF SYSTEMS:  Review of Systems  Constitutional: Negative for appetite change, chills, fatigue, fever and unexpected weight change.  HENT:   Negative for hearing loss, lump/mass and trouble swallowing.   Eyes: Negative for eye problems and icterus.  Respiratory: Negative for chest tightness, cough and shortness of breath.   Cardiovascular: Negative for chest pain, leg swelling and palpitations.  Gastrointestinal: Negative for abdominal distention, abdominal pain, constipation, diarrhea, nausea and vomiting.  Endocrine: Negative for hot flashes.  Genitourinary: Negative for difficulty urinating.   Musculoskeletal: Negative for arthralgias.  Skin: Negative  for itching and rash.  Neurological: Negative for dizziness, extremity weakness, headaches and numbness.  Hematological: Negative for adenopathy. Does not bruise/bleed easily.  Psychiatric/Behavioral: Negative for depression. The patient is not nervous/anxious.   Breast: Denies any new nodularity, masses, tenderness, nipple changes, or nipple discharge.       PAST MEDICAL/SURGICAL HISTORY:  Past Medical History:  Diagnosis Date  . Anxiety   . Arthritis   . Breast cancer ([place]) 2006   right breast  . Chronic cough   . Chronic UTI   . Depression   . Dysrhythmia   . Esophageal reflux   . Esophageal stricture   . Family history of malignant neoplasm of gastrointestinal tract   . GERD (gastroesophageal reflux disease)   . Hearing loss   . Hemorrhoids    internal  . History of hiatal hernia   . Hypercholesterolemia   . Hypertension   . Numbness and tingling    feeet, occasionally  . Osteopenia   . Personal history of colonic polyps 02/09/2007   TUBULAR ADENOMA  . Personal history of radiation therapy   . PONV (postoperative nausea and vomiting)    trouble opening mouth and jaw, jaw pops, trouble turning head  . Rotator cuff tear    RT  . Status post dilation of esophageal narrowing   . Umbilical hernia    Past Surgical History:  Procedure Laterality Date  . BREAST LUMPECTOMY  2006   right lumpectomy  . CATARACT EXTRACTION W/ INTRAOCULAR LENS IMPLANT Bilateral   . COLONOSCOPY    . CYSTOCELE REPAIR N/A 04/20/2016   Procedure: AUGMENTED ANTERIOR VAULT REPAIR,  COLOPLAST DERMIS GRAFT KELLY PLICATION SACROSPINOUS FIXATION;  Surgeon: Carolan Clines, MD;  Location: [facility];  Service: Urology;  Laterality: N/A;  . DILATION AND CURETTAGE OF UTERUS    . ESOPHAGOGASTRODUODENOSCOPY ENDOSCOPY    . PUBOVAGINAL SLING N/A 04/20/2016   Procedure: PUBO-VAGINAL [name];  Surgeon: Carolan Clines, MD;  Location: [facility];  Service: Urology;  Laterality: N/A;  . REVISION URINARY SLING  N/A 06/12/2016   Procedure: INCISION OF URETHRAL SLING;  Surgeon: Carolan Clines, MD;  Location: [facility];  Service: Urology;  Laterality: N/A;  1 HOUR  . SHOULDER OPEN ROTATOR CUFF REPAIR Right 02/28/2013   Procedure: RIGHT ROTATOR CUFF REPAIR SHOULDER OPEN WITH GRAFT AND ANCHORS;  Surgeon: Tobi Bastos, MD;  Location: [facility];  Service: Orthopedics;  Laterality: Right;  . UMBILICAL HERNIA REPAIR       ALLERGIES:  Allergies  Allergen Reactions  . Ciprofloxacin Other (See Comments)    Reaction unknown  . Clindamycin/Lincomycin Other (See Comments)    Reaction unknown   . Codeine Nausea And Vomiting  . Diclofenac Other (See Comments)    Reaction unknown   . Fenoprofen Calcium Other (See Comments)    Reaction unknown   . Flexeril [Cyclobenzaprine Hcl] Other (See Comments)    Reaction unknown   . Hydrocodone Other (See Comments)    "Last time it was too strong and messed up my mind"  . Macrobid [Nitrofurantoin] Other (See Comments)    Reaction unknown   . Noroxin [Norfloxacin] Other (See Comments)    Reaction unknown   . Relafen [Nabumetone] Other (See Comments)    Reaction unknown   . Sulfa Antibiotics Other (See Comments)    Reaction unknown      CURRENT MEDICATIONS:  Outpatient Encounter Medications as of 01/17/2021  Medication Sig  . acetaminophen (TYLENOL) 325 MG tablet Take 650 mg by mouth every 6 (six) hours as needed for moderate pain.  Marland Kitchen albuterol (PROVENTIL HFA;VENTOLIN HFA) 108 (90 Base) MCG/ACT inhaler Inhale 2 puffs into the lungs every 6 (six) hours as needed for wheezing or shortness of breath.  Marland Kitchen amitriptyline (ELAVIL) 25 MG tablet 25 mg 2 (two) times daily.   Marland Kitchen aspirin EC 81 MG tablet Take 81 mg by mouth daily.  Marland Kitchen atenolol (TENORMIN) 25 MG tablet Take 0.5 tablets (12.5 mg total) by mouth daily.  . Calcium Carb-Cholecalciferol (CALCIUM 600 + D PO) Take 1 tablet by mouth daily.  . fluticasone (CUTIVATE) 0.005 % ointment Apply 1 application topically  daily as needed (itching).  . hydrochlorothiazide (MICROZIDE) 12.5 MG capsule Take 12.5 mg by mouth daily.  . meclizine (ANTIVERT) 12.5 MG tablet Take 12.5 mg by mouth 2 (two) times daily.  . meloxicam (MOBIC) 7.5 MG tablet Take 7.5 mg by mouth as needed.  . Multiple Vitamin (MULTIVITAMIN WITH MINERALS) TABS tablet Take 1 tablet by mouth daily.  Marland Kitchen omeprazole (PRILOSEC) 20 MG capsule Take 20 mg by mouth daily as needed (For heartburn or acid reflux.).   Marland Kitchen perphenazine (TRILAFON) 2 MG tablet Take 2 mg by mouth 2 (two) times daily.   . polyethylene glycol powder (GLYCOLAX/MIRALAX) powder Take 17 g by mouth daily.  . pravastatin (PRAVACHOL) 20 MG tablet Take 20 mg by mouth at bedtime.    Facility-Administered Encounter Medications as of 01/17/2021  Medication  . 0.9 %  sodium chloride infusion     ONCOLOGIC FAMILY HISTORY:  Family History  Problem Relation Age of Onset  . Colon cancer Mother 93  . Kidney cancer  Mother   . Glaucoma Mother   . Heart disease Sister   . Stomach cancer Brother   . Stroke Sister   . Lung cancer Brother   . Clotting disorder Son   . Emphysema Father        smoker  . Hypertension Sister        SOCIAL HISTORY:  Natalie RUDE is widowed and lives alone in [place], [place].  She has one son who lives in a trailer behind her house.  Ms. Enyeart is currently retired.  She denies any current or history of tobacco, alcohol, or illicit drug use.  (reviewed 01/17/2021)   PHYSICAL EXAMINATION:  Vital Signs: Vitals:   01/17/21 1302  BP: 133/75  Pulse: 69  Resp: 16  Temp: 97.7 F (36.5 C)  SpO2: 96%   Filed Weights   01/17/21 1302  Weight: 144 lb 6.4 oz (65.5 kg)   General: Well-nourished, well-appearing female in no acute distress.  Unaccompanied today.   HEENT: Head is normocephalic.  Pupils equal and reactive to light. Conjunctivae clear without exudate.  Sclerae anicteric. Oral mucosa is pink, moist.  Oropharynx is pink without lesions or  erythema.  Lymph: No cervical, supraclavicular, or infraclavicular lymphadenopathy noted on palpation.  Cardiovascular: Regular rate and rhythm.Marland Kitchen Respiratory: LLL rales diminished bases bilaterally, otherwise clear throughout  Chest expansion symmetric; breathing non-labored.  Breast Exam:  -Left breast: No appreciable masses on palpation. No skin redness, thickening, or peau d'orange appearance;  ;-Right breast: No appreciable masses on palpation. No skin redness, thickening, or peau d'orange appearance; no nipple retraction or nipple discharge; mild distortion in symmetry at previous lumpectomy site well healed scar without erythema or nodularity. -Axilla: No axillary adenopathy bilaterally.  GI: Abdomen soft and round; non-tender, non-distended. Bowel sounds normoactive. No hepatosplenomegaly.   GU: Deferred.  Neuro: No focal deficits. Steady gait.  Psych: Mood and affect normal and appropriate for situation.  MSK: No focal spinal tenderness to palpation, full range of motion in bilateral upper extremities Extremities: No edema. Skin: Warm and dry.    LABORATORY DATA:  None for this visit   DIAGNOSTIC IMAGING:  Most recent mammogram:  CLINICAL DATA:  Screening. History of RIGHT breast cancer and lumpectomy in 2006.  EXAM: DIGITAL SCREENING BILATERAL MAMMOGRAM WITH TOMO AND CAD  COMPARISON:  Previous exam(s).  ACR Breast Density Category b: There are scattered areas of fibroglandular density.  FINDINGS: There are no findings suspicious for malignancy.  RIGHT lumpectomy again noted.  Images were processed with CAD.  IMPRESSION: No mammographic evidence of malignancy. A result letter of this screening mammogram will be mailed directly to the patient.  RECOMMENDATION: Screening mammogram in one year. (Code:SM-B-01Y)  BI-RADS CATEGORY  2: Benign.   Electronically Signed   By: Margarette Canada M.D.   On: 01/19/2020 15:56    ASSESSMENT AND PLAN:  Ms.. Desantiago  is a pleasant 87 y.o. female with history of Stage IA right breast invasive ductal carcinoma, ER+/PR-/HER2-, diagnosed in 11/2004, treated with lumpectomy, adjuvant radiation therapy, and anti-estrogen therapy with Aromasin x 5 years completed in 01/2010.  She presents to the [facility] for surveillance and routine follow-up.   1. History of breast cancer:  Ms. Singleterry is currently clinically and radiographically without evidence of disease or recurrence of breast cancer. She will undergo mammogram tomorrow.  We will see her back in one year for LTS follow up.  I encouraged her to call me with any questions or concerns before her next  visit at the cancer center, and I would be happy to see her sooner, if needed.    2. Bone health:    She was given education on specific food and activities to promote bone health.  3. Cancer screening:  Due to Ms. Griffey's history and her age, she should receive screening for skin cancers. She was encouraged to follow-up with her PCP for appropriate cancer screenings.   4. Health maintenance and wellness promotion: Ms. Grunert was encouraged to consume 5-7 servings of fruits and vegetables per day. She was also encouraged to engage in moderate to vigorous exercise for 30 minutes per day most days of the week. She was instructed to limit her alcohol consumption and continue to abstain from tobacco use.   5. Abnormal lung exam: Patient will undergo chest xray today.    Dispo:  -Return to cancer center in one year for lts follow-up -mammogram 01/2021   Total encounter time: 20 minutes*   Wilber Bihari, NP 01/17/21 1:45 PM Medical Oncology and Hematology [facility] [place], [place] [address] Tel. [phone]    Fax. [phone]   *Total Encounter Time as defined by the Centers for Medicare and Medicaid Services includes, in addition to the face-to-face time of a patient visit (documented in the note above) non-face-to-face time:  obtaining and reviewing outside history, ordering and reviewing medications, tests or procedures, care coordination (communications with other health care professionals or caregivers) and documentation in the medical record.    Note: PRIMARY CARE PROVIDER Christain [place], [place] [phone]

## 2021-01-17 NOTE — Telephone Encounter (Signed)
Scheduled appointment per 3/18 los. Pt aware.

## 2021-01-20 ENCOUNTER — Encounter: Payer: Medicare Other | Admitting: Adult Health

## 2021-01-20 ENCOUNTER — Telehealth: Payer: Self-pay

## 2021-01-20 NOTE — Telephone Encounter (Signed)
Pt notified of CXR results, no further needs at this time.

## 2021-02-08 ENCOUNTER — Emergency Department (HOSPITAL_COMMUNITY): Payer: Medicare HMO

## 2021-02-08 ENCOUNTER — Encounter (HOSPITAL_COMMUNITY): Payer: Self-pay

## 2021-02-08 ENCOUNTER — Other Ambulatory Visit: Payer: Self-pay

## 2021-02-08 ENCOUNTER — Emergency Department (HOSPITAL_COMMUNITY)
Admission: EM | Admit: 2021-02-08 | Discharge: 2021-02-08 | Disposition: A | Discharge status: Home / Self Care | Payer: Medicare HMO | Attending: Emergency Medicine | Admitting: Emergency Medicine

## 2021-02-08 DIAGNOSIS — Z79899 Other long term (current) drug therapy: Secondary | ICD-10-CM | POA: Diagnosis not present

## 2021-02-08 DIAGNOSIS — Y92009 Unspecified place in unspecified non-institutional (private) residence as the place of occurrence of the external cause: Secondary | ICD-10-CM | POA: Diagnosis not present

## 2021-02-08 DIAGNOSIS — Z923 Personal history of irradiation: Secondary | ICD-10-CM | POA: Insufficient documentation

## 2021-02-08 DIAGNOSIS — Z23 Encounter for immunization: Secondary | ICD-10-CM | POA: Insufficient documentation

## 2021-02-08 DIAGNOSIS — W108XXA Fall (on) (from) other stairs and steps, initial encounter: Secondary | ICD-10-CM | POA: Diagnosis not present

## 2021-02-08 DIAGNOSIS — I10 Essential (primary) hypertension: Secondary | ICD-10-CM | POA: Insufficient documentation

## 2021-02-08 DIAGNOSIS — S0181XA Laceration without foreign body of other part of head, initial encounter: Secondary | ICD-10-CM | POA: Insufficient documentation

## 2021-02-08 DIAGNOSIS — Z7982 Long term (current) use of aspirin: Secondary | ICD-10-CM | POA: Diagnosis not present

## 2021-02-08 DIAGNOSIS — Z853 Personal history of malignant neoplasm of breast: Secondary | ICD-10-CM | POA: Diagnosis not present

## 2021-02-08 DIAGNOSIS — M25512 Pain in left shoulder: Secondary | ICD-10-CM | POA: Insufficient documentation

## 2021-02-08 DIAGNOSIS — S0990XA Unspecified injury of head, initial encounter: Secondary | ICD-10-CM | POA: Diagnosis present

## 2021-02-08 MED ORDER — LIDOCAINE-EPINEPHRINE-TETRACAINE (LET) TOPICAL GEL
3.0000 mL | Freq: Once | TOPICAL | Status: AC
  Administered 2021-02-08: 3 mL via TOPICAL
  Filled 2021-02-08: qty 3

## 2021-02-08 MED ORDER — LIDOCAINE-EPINEPHRINE (PF) 2 %-1:200000 IJ SOLN
10.0000 mL | Freq: Once | INTRAMUSCULAR | Status: AC
  Administered 2021-02-08: 10 mL
  Filled 2021-02-08: qty 20

## 2021-02-08 MED ORDER — TETANUS-DIPHTH-ACELL PERTUSSIS 5-2.5-18.5 LF-MCG/0.5 IM SUSY
0.5000 mL | PREFILLED_SYRINGE | Freq: Once | INTRAMUSCULAR | Status: AC
  Administered 2021-02-08: 0.5 mL via INTRAMUSCULAR
  Filled 2021-02-08: qty 0.5

## 2021-02-08 NOTE — ED Provider Notes (Signed)
[facility] DEPT Provider Note   CSN: [id number] Arrival date & time: 02/08/21  1304     History Chief Complaint  Patient presents with  . Fall  . Head Laceration    Natalie West is a 87 y.o. female.  HPI Patient is an 87 year old female with an extensive medical history noted below.  She presents the emergency department today due to a fall.  Patient states that she was at the front of her house going up her concrete steps and one of her cats walked underneath her feet causing her to trip and fall forwards.  She struck the left upper forehead on a concrete step resulting in a large laceration with controlled bleeding.  She reports moderate pain in the region.  Denies any LOC.  She is not anticoagulated.  Patient reports some mild left shoulder pain with movement of the left arm.  No chest pain, shortness of breath, neck pain, back pain, hip pain, pain in the extremities.  Patient states she has been ambulatory since the accident.  Her last tetanus was in 2008.    Past Medical History:  Diagnosis Date  . Anxiety   . Arthritis   . Breast cancer ([place]) 2006   right breast  . Chronic cough   . Chronic UTI   . Depression   . Dysrhythmia   . Esophageal reflux   . Esophageal stricture   . Family history of malignant neoplasm of gastrointestinal tract   . GERD (gastroesophageal reflux disease)   . Hearing loss   . Hemorrhoids    internal  . History of hiatal hernia   . Hypercholesterolemia   . Hypertension   . Numbness and tingling    feeet, occasionally  . Osteopenia   . Personal history of colonic polyps 02/09/2007   TUBULAR ADENOMA  . Personal history of radiation therapy   . PONV (postoperative nausea and vomiting)    trouble opening mouth and jaw, jaw pops, trouble turning head  . Rotator cuff tear    RT  . Status post dilation of esophageal narrowing   . Umbilical hernia     Patient Active Problem List   Diagnosis Date Noted  . Mallet  finger of left finger(s) 10/08/2016  . Recurrent urinary tract infection 01/20/2015  . Cystocele 01/20/2015  . Constipation 08/22/2014  . Rotator cuff (capsule) sprain 02/28/2013  . Breast cancer of lower-outer quadrant of right female breast ([place])   . Internal hemorrhoids 06/28/2011  . Gastroenteritis 06/28/2011  . Hyperlipidemia 02/18/2008  . GERD 02/18/2008  . HIATAL HERNIA 02/18/2008  . OSTEOARTHRITIS 02/18/2008  . COLONIC POLYPS, ADENOMATOUS 02/09/2007  . DIVERTICULITIS, ACUTE 02/09/2007  . ESOPHAGEAL STRICTURE 11/19/1998    Past Surgical History:  Procedure Laterality Date  . BREAST LUMPECTOMY  2006   right lumpectomy  . CATARACT EXTRACTION W/ INTRAOCULAR LENS IMPLANT Bilateral   . COLONOSCOPY    . CYSTOCELE REPAIR N/A 04/20/2016   Procedure: AUGMENTED ANTERIOR VAULT REPAIR, COLOPLAST DERMIS GRAFT KELLY PLICATION SACROSPINOUS FIXATION;  Surgeon: Carolan Clines, MD;  Location: [facility];  Service: Urology;  Laterality: N/A;  . DILATION AND CURETTAGE OF UTERUS    . ESOPHAGOGASTRODUODENOSCOPY ENDOSCOPY    . PUBOVAGINAL SLING N/A 04/20/2016   Procedure: PUBO-VAGINAL [name];  Surgeon: Carolan Clines, MD;  Location: [facility];  Service: Urology;  Laterality: N/A;  . REVISION URINARY SLING N/A 06/12/2016   Procedure: INCISION OF URETHRAL SLING;  Surgeon: Carolan Clines, MD;  Location: [facility];  Service: Urology;  Laterality: N/A;  1 HOUR  . SHOULDER OPEN ROTATOR CUFF REPAIR Right 02/28/2013   Procedure: RIGHT ROTATOR CUFF REPAIR SHOULDER OPEN WITH GRAFT AND ANCHORS;  Surgeon: Tobi Bastos, MD;  Location: [facility];  Service: Orthopedics;  Laterality: Right;  . UMBILICAL HERNIA REPAIR       OB History    Gravida  1   Para  1   Term      Preterm      AB      Living  1     SAB      IAB      Ectopic      Multiple      Live Births              Family History  Problem Relation Age of Onset  . Colon cancer Mother 75  . Kidney cancer Mother   .  Glaucoma Mother   . Heart disease Sister   . Stomach cancer Brother   . Stroke Sister   . Lung cancer Brother   . Clotting disorder Son   . Emphysema Father        smoker  . Hypertension Sister     Social History   Tobacco Use  . Smoking status: Never Smoker  . Smokeless tobacco: Never Used  Substance Use Topics  . Alcohol use: No    Alcohol/week: 0.0 standard drinks  . Drug use: No    Home Medications Prior to Admission medications   Medication Sig Start Date End Date Taking? Authorizing Provider  acetaminophen (TYLENOL) 325 MG tablet Take 650 mg by mouth every 6 (six) hours as needed for moderate pain.    [provider]  albuterol (PROVENTIL HFA;VENTOLIN HFA) 108 (90 Base) MCG/ACT inhaler Inhale 2 puffs into the lungs every 6 (six) hours as needed for wheezing or shortness of breath. 12/12/18   Gardenia Phlegm, NP  amitriptyline (ELAVIL) 25 MG tablet 25 mg 2 (two) times daily.  10/23/18   [provider]  aspirin EC 81 MG tablet Take 81 mg by mouth daily.    [provider]  atenolol (TENORMIN) 25 MG tablet Take 0.5 tablets (12.5 mg total) by mouth daily. 01/23/20   Troy Sine, MD  Calcium Carb-Cholecalciferol (CALCIUM 600 + D PO) Take 1 tablet by mouth daily.    [provider]  fluticasone (CUTIVATE) 0.005 % ointment Apply 1 application topically daily as needed (itching).    [provider]  hydrochlorothiazide (MICROZIDE) 12.5 MG capsule Take 12.5 mg by mouth daily. 05/04/15   [provider]  meclizine (ANTIVERT) 12.5 MG tablet Take 12.5 mg by mouth 2 (two) times daily.    [provider]  meloxicam (MOBIC) 7.5 MG tablet Take 7.5 mg by mouth as needed.    [provider]  Multiple Vitamin (MULTIVITAMIN WITH MINERALS) TABS tablet Take 1 tablet by mouth daily.    [provider]  omeprazole (PRILOSEC) 20 MG capsule Take 20 mg by mouth daily as needed (For heartburn or acid reflux.).      [provider]  perphenazine (TRILAFON) 2 MG tablet Take 2 mg by mouth 2 (two) times daily.  12/01/18   [provider]  polyethylene glycol powder (GLYCOLAX/MIRALAX) powder Take 17 g by mouth daily.    [provider]  pravastatin (PRAVACHOL) 20 MG tablet Take 20 mg by mouth at bedtime.     [provider]    Allergies  Ciprofloxacin, Clindamycin/lincomycin, Codeine, Diclofenac, Fenoprofen calcium, Flexeril [cyclobenzaprine hcl], Hydrocodone, Macrobid [nitrofurantoin], Noroxin [norfloxacin], Relafen [nabumetone], and Sulfa antibiotics  Review of Systems   Review of Systems  All other systems reviewed and are negative. Ten systems reviewed and are negative for acute change, except as noted in the HPI.   Physical Exam Updated Vital Signs BP (!) 148/77   Pulse 66   Temp (!) 97.5 F (36.4 C) (Oral)   Resp 16   LMP 11/03/1983   SpO2 100%   Physical Exam Vitals and nursing note reviewed.  Constitutional:      General: She is not in acute distress.    Appearance: Normal appearance. She is normal weight. She is not ill-appearing, toxic-appearing or diaphoretic.  HENT:     Head: Normocephalic.     Comments: 10 cm curved laceration noted along the left superior forehead along the scalp line.  Well approximated.  Mild bleeding that resolves with direct pressure.  Mild tenderness overlying the site.  No crepitus.    Right Ear: External ear normal.     Left Ear: External ear normal.     Nose: Nose normal.     Mouth/Throat:     Mouth: Mucous membranes are moist.     Pharynx: Oropharynx is clear. No oropharyngeal exudate or posterior oropharyngeal erythema.  Eyes:     General: No scleral icterus.       Right eye: No discharge.        Left eye: No discharge.     Extraocular Movements: Extraocular movements intact.     Conjunctiva/sclera: Conjunctivae normal.  Cardiovascular:     Rate and Rhythm: Normal rate and regular rhythm.     Pulses: Normal  pulses.     Heart sounds: Normal heart sounds. No murmur heard. No friction rub. No gallop.   Pulmonary:     Effort: Pulmonary effort is normal. No respiratory distress.     Breath sounds: Normal breath sounds. No stridor. No wheezing, rhonchi or rales.  Abdominal:     General: Abdomen is flat.     Palpations: Abdomen is soft.     Tenderness: There is no abdominal tenderness.     Comments: Abdomen is flat, soft, and nontender.  Musculoskeletal:        General: Tenderness present. Normal range of motion.     Cervical back: Normal range of motion and neck supple. No tenderness.     Comments: Mild tenderness noted in the left shoulder with range of motion of the left arm.  Full range of motion of the bilateral upper extremities.  No midline C, T, or L-spine tenderness.  No pain with manipulation of the pelvic girdle.  No palpable pain in the knees or ankles.  Full range of motion of the knees and ankles.  Patient able to extend the legs into the air from a supine position without difficulty.  Skin:    General: Skin is warm and dry.  Neurological:     General: No focal deficit present.     Mental Status: She is alert and oriented to person, place, and time.     Comments: Patient is oriented to person, place, and time. Patient phonates in clear, complete, and coherent sentences. Distal sensation intact in all four extremities.  Moving all 4 extremities with ease.  No gross deficits.  Psychiatric:        Mood and Affect: Mood normal.        Behavior: Behavior normal.    ED  Results / Procedures / Treatments   Labs (all labs ordered are listed, but only abnormal results are displayed) Labs Reviewed - No data to display  EKG None  Radiology CT Head Wo Contrast  Result Date: 02/08/2021 CLINICAL DATA:  Pain following fall EXAM: CT HEAD WITHOUT CONTRAST TECHNIQUE: Contiguous axial images were obtained from the base of the skull through the vertex without intravenous contrast.  COMPARISON:  None. FINDINGS: Brain: There is age related volume loss. There is no intracranial mass, hemorrhage, extra-axial fluid collection, or midline shift. There is evidence of encephalomalacia in the left temporal lobe, presumably from prior infarct in this area. There is decreased attenuation in portions of the centra semiovale bilaterally consistent with periventricular small vessel disease. Decreased attenuation is noted in portions of the left internal and external capsules as well as in a portion of the anterior limb of the right external capsule. No evident acute infarct. Vascular: No hyperdense vessels. There is calcification in the distal vertebral arteries and in each carotid siphon. Skull: Bony calvarium appears intact. There is a soft tissue laceration in the left frontal scalp region. Sinuses/Orbits: There is mild mucosal thickening in the inferomedial right maxillary antrum. Other paranasal sinuses are clear. Orbits appear symmetric bilaterally. Other: Mastoid air cells are clear. IMPRESSION: Age related volume loss with supratentorial small vessel disease. 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 No acute infarct evident. No mass or hemorrhage. There are foci of arterial vascular calcification. Soft tissue laceration left frontal scalp region with underlying bony calvarium intact. Electronically Signed   By: Lowella Grip III M.D.   On: 02/08/2021 14:49   DG Shoulder Left  Result Date: 02/08/2021 CLINICAL DATA:  Pain following fall EXAM: LEFT SHOULDER - 2+ VIEW COMPARISON:  None. FINDINGS: Oblique and Y scapular images were obtained. There is no appreciable fracture or dislocation. There is moderate generalized joint space narrowing. No erosive change. Chronic interstitial thickening noted left lung. IMPRESSION: No fracture or dislocation. Moderate generalized osteoarthritic change. Question underlying fibrotic change in the left lung. Electronically Signed   By:  Lowella Grip III M.D.   On: 02/08/2021 14:45    Procedures .Marland KitchenLaceration Repair  Date/Time: 02/08/2021 3:31 PM Performed by: Rayna Sexton, PA-C Authorized by: Rayna Sexton, PA-C   Consent:    Consent obtained:  Verbal   Consent given by:  Patient   Risks, benefits, and alternatives were discussed: yes     Risks discussed:  Infection, pain, poor cosmetic result and retained foreign body Universal protocol:    Procedure explained and questions answered to patient or proxy's satisfaction: yes     Relevant documents present and verified: yes     Test results available: yes     Imaging studies available: yes     Required blood products, implants, devices, and special equipment available: yes     Site/side marked: yes     Immediately prior to procedure, a time out was called: yes     Patient identity confirmed:  Verbally with patient Anesthesia:    Anesthesia method:  Local infiltration   Local anesthetic:  Lidocaine 2% WITH epi Laceration details:    Location:  Scalp   Scalp location:  Frontal   Length (cm):  10 Pre-procedure details:    Preparation:  Patient was prepped and draped in usual sterile fashion and imaging obtained to evaluate for foreign bodies Treatment:    Area cleansed with:  Povidone-iodine and saline   Amount of cleaning:  Extensive  Irrigation solution:  Sterile saline Skin repair:    Repair method:  Sutures   Suture size:  5-0   Suture material:  Prolene   Suture technique:  Simple interrupted   Number of sutures:  7 Approximation:    Approximation:  Close Repair type:    Repair type:  Intermediate Post-procedure details:    Dressing:  Antibiotic ointment and non-adherent dressing   Procedure completion:  Tolerated well, no immediate complications     Medications Ordered in ED Medications  Tdap (BOOSTRIX) injection 0.5 mL (has no administration in time range)  lidocaine-EPINEPHrine (XYLOCAINE W/EPI) 2 %-1:200000 (PF) injection 10 mL  (10 mLs Infiltration Given 02/08/21 1447)  lidocaine-EPINEPHrine-tetracaine (LET) topical gel (3 mLs Topical Given 02/08/21 1447)    ED Course  I have reviewed the triage vital signs and the nursing notes.  Pertinent labs & imaging results that were available during my care of the patient were reviewed by me and considered in my medical decision making (see chart for details).    MDM Rules/Calculators/A&P                          Pt is a 87 y.o. female who presents the emergency department due to a fall that occurred prior to arrival.  Imaging: X-ray of the left shoulder shows no fracture or dislocation.  Moderate generalized osteoarthritic change. CT scan of the head without contrast shows age-related volume loss with supratentorial small vessel disease.  Apparent prior infarct with encephalomalacia of the left temporal lobe.  No acute infarct evident.  No mass or hemorrhage.  There are foci of arterial vascular calcifications.  Soft tissue laceration of the left frontal scalp region with underlying bony calvarium intact.  I, Rayna Sexton, PA-C, personally reviewed and evaluated these images and lab results as part of my medical decision-making.  Wound to the left forehead was cleaned and closed with 5-0 Prolene sutures.  Suture removal in 1 week.  Tdap last performed in 2008, so this was updated in the emergency department.  Discussed wound care in length with patient as well as her nephew who is at bedside.  Discussed return precautions.  Feel that she is stable for discharge and she is agreeable.  Her questions were answered and she was amicable at the time of discharge.  Note: Portions of this report may have been transcribed using voice recognition software. Every effort was made to ensure accuracy; however, inadvertent computerized transcription errors may be present.   Final Clinical Impression(s) / ED Diagnoses Final diagnoses:  Injury of head, initial encounter  Laceration of  other part of head without foreign body, initial encounter   Rx / DC Orders ED Discharge Orders    None       Rayna Sexton, PA-C 02/08/21 1534    Lacretia Leigh, MD 02/13/21 1116

## 2021-02-08 NOTE — ED Notes (Signed)
Patient transported to CT 

## 2021-02-08 NOTE — ED Notes (Signed)
Patient's forehead and hairline cleansed of blood with saline, gauze and koban applied

## 2021-02-08 NOTE — Discharge Instructions (Addendum)
Please apply antibiotic ointment to your cut twice a day for the next week.  You need to have your stitches removed in 5 to 7 days.  If you develop discharge from the wound, redness, fevers, nausea, vomiting, please return the emergency department immediately for reevaluation.  It was a pleasure to meet you both.

## 2021-02-08 NOTE — ED Triage Notes (Signed)
Coming from home, patient tripped and hit head on step outside, denies loss of consciousness, denies blood thinners, lac to left side of head

## 2021-02-11 ENCOUNTER — Ambulatory Visit: Payer: Medicare Other

## 2021-02-12 ENCOUNTER — Other Ambulatory Visit: Payer: Self-pay

## 2021-02-12 ENCOUNTER — Encounter (INDEPENDENT_AMBULATORY_CARE_PROVIDER_SITE_OTHER): Payer: Medicare HMO | Admitting: Ophthalmology

## 2021-02-12 DIAGNOSIS — I10 Essential (primary) hypertension: Secondary | ICD-10-CM

## 2021-02-12 DIAGNOSIS — H35033 Hypertensive retinopathy, bilateral: Secondary | ICD-10-CM

## 2021-02-12 DIAGNOSIS — H353211 Exudative age-related macular degeneration, right eye, with active choroidal neovascularization: Secondary | ICD-10-CM

## 2021-02-12 DIAGNOSIS — H34832 Tributary (branch) retinal vein occlusion, left eye, with macular edema: Secondary | ICD-10-CM | POA: Diagnosis not present

## 2021-02-12 DIAGNOSIS — H43813 Vitreous degeneration, bilateral: Secondary | ICD-10-CM

## 2021-02-17 ENCOUNTER — Other Ambulatory Visit: Payer: Self-pay

## 2021-02-17 ENCOUNTER — Ambulatory Visit
Admission: RE | Admit: 2021-02-17 | Discharge: 2021-02-17 | Disposition: A | Discharge status: Home / Self Care | Payer: Medicare HMO | Source: Ambulatory Visit | Attending: Family Medicine | Admitting: Family Medicine

## 2021-02-17 DIAGNOSIS — Z0000 Encounter for general adult medical examination without abnormal findings: Secondary | ICD-10-CM

## 2021-03-19 ENCOUNTER — Other Ambulatory Visit: Payer: Self-pay

## 2021-03-19 ENCOUNTER — Encounter (INDEPENDENT_AMBULATORY_CARE_PROVIDER_SITE_OTHER): Payer: Medicare HMO | Admitting: Ophthalmology

## 2021-03-19 DIAGNOSIS — H35033 Hypertensive retinopathy, bilateral: Secondary | ICD-10-CM

## 2021-03-19 DIAGNOSIS — H43813 Vitreous degeneration, bilateral: Secondary | ICD-10-CM

## 2021-03-19 DIAGNOSIS — I10 Essential (primary) hypertension: Secondary | ICD-10-CM | POA: Diagnosis not present

## 2021-03-19 DIAGNOSIS — H353211 Exudative age-related macular degeneration, right eye, with active choroidal neovascularization: Secondary | ICD-10-CM

## 2021-03-19 DIAGNOSIS — H34832 Tributary (branch) retinal vein occlusion, left eye, with macular edema: Secondary | ICD-10-CM | POA: Diagnosis not present

## 2021-03-25 ENCOUNTER — Encounter (INDEPENDENT_AMBULATORY_CARE_PROVIDER_SITE_OTHER): Payer: Medicare HMO | Admitting: Ophthalmology

## 2021-03-26 ENCOUNTER — Encounter (INDEPENDENT_AMBULATORY_CARE_PROVIDER_SITE_OTHER): Payer: Medicare HMO | Admitting: Ophthalmology

## 2021-03-28 ENCOUNTER — Ambulatory Visit (INDEPENDENT_AMBULATORY_CARE_PROVIDER_SITE_OTHER): Payer: Medicare HMO | Admitting: Ophthalmology

## 2021-03-28 ENCOUNTER — Encounter (INDEPENDENT_AMBULATORY_CARE_PROVIDER_SITE_OTHER): Payer: Self-pay | Admitting: Ophthalmology

## 2021-03-28 ENCOUNTER — Other Ambulatory Visit: Payer: Self-pay

## 2021-03-28 DIAGNOSIS — Z961 Presence of intraocular lens: Secondary | ICD-10-CM

## 2021-03-28 DIAGNOSIS — H3581 Retinal edema: Secondary | ICD-10-CM

## 2021-03-28 DIAGNOSIS — H34832 Tributary (branch) retinal vein occlusion, left eye, with macular edema: Secondary | ICD-10-CM

## 2021-03-28 DIAGNOSIS — H35033 Hypertensive retinopathy, bilateral: Secondary | ICD-10-CM | POA: Diagnosis not present

## 2021-03-28 DIAGNOSIS — I10 Essential (primary) hypertension: Secondary | ICD-10-CM

## 2021-03-28 NOTE — Progress Notes (Signed)
[facility] Note  03/28/2021     CHIEF COMPLAINT Patient presents for Retina Evaluation   HISTORY OF PRESENT ILLNESS: Natalie West is a 87 y.o. female who presents to the clinic today for:   HPI    Retina Evaluation    In left eye.  This started 9 days ago.  Duration of 9 days.  Associated Symptoms Floaters.  Negative for Flashes, Distortion, Blind Spot, Pain, Redness, Photophobia, Glare, Trauma, Scalp Tenderness, Jaw Claudication, Shoulder/Hip pain, Fever, Weight Loss and Fatigue.  Context:  distance vision, mid-range vision and near vision.  Treatments tried include no treatments.  I, the attending physician,  performed the HPI with the patient and updated documentation appropriately.          Comments    Patient states has had floater OS since avastin injection 9 days ago. Floater has gotten smaller over the course of the past 9 days. No flashes. Has had floaters before after avastin injections, but not this long-lasting.        Last edited by Bernarda Caffey, MD on 03/28/2021  3:53 PM. (History)    pt is a pt of Dr. Zigmund Daniel that receives injections for BRVO in her left eye, she received her last injection on May 18th and states she has been seeing a floater since then, but it has gotten smaller, she states she fell and hit her head on April 9th, she states nothing hurts today, but she wasn't sure if this could have caused the floater  Referring physician: Christain Sacramento, MD [place],  [place] [id number]  HISTORICAL INFORMATION:   Selected notes from the [facility] pt [place]: 5.18.22 Ocular Hx- BRVO OS s/p IVA OS on 5.18.22 PMH-    CURRENT MEDICATIONS: No current outpatient medications on file. (Ophthalmic Drugs)   No current facility-administered medications for this visit. (Ophthalmic Drugs)   Current Outpatient Medications (Other)  Medication Sig  . acetaminophen (TYLENOL) 325 MG tablet Take 650 mg by mouth  every 6 (six) hours as needed for moderate pain.  Marland Kitchen albuterol (PROVENTIL HFA;VENTOLIN HFA) 108 (90 Base) MCG/ACT inhaler Inhale 2 puffs into the lungs every 6 (six) hours as needed for wheezing or shortness of breath.  Marland Kitchen amitriptyline (ELAVIL) 25 MG tablet 25 mg 2 (two) times daily.   Marland Kitchen aspirin EC 81 MG tablet Take 81 mg by mouth daily.  Marland Kitchen atenolol (TENORMIN) 25 MG tablet Take 0.5 tablets (12.5 mg total) by mouth daily.  . Calcium Carb-Cholecalciferol (CALCIUM 600 + D PO) Take 1 tablet by mouth daily.  . fluticasone (CUTIVATE) 0.005 % ointment Apply 1 application topically daily as needed (itching).  . hydrochlorothiazide (MICROZIDE) 12.5 MG capsule Take 12.5 mg by mouth daily.  . meclizine (ANTIVERT) 12.5 MG tablet Take 12.5 mg by mouth 2 (two) times daily.  . meloxicam (MOBIC) 7.5 MG tablet Take 7.5 mg by mouth as needed.  . Multiple Vitamin (MULTIVITAMIN WITH MINERALS) TABS tablet Take 1 tablet by mouth daily.  Marland Kitchen omeprazole (PRILOSEC) 20 MG capsule Take 20 mg by mouth daily as needed (For heartburn or acid reflux.).   Marland Kitchen perphenazine (TRILAFON) 2 MG tablet Take 2 mg by mouth 2 (two) times daily.   . polyethylene glycol powder (GLYCOLAX/MIRALAX) powder Take 17 g by mouth daily.  . pravastatin (PRAVACHOL) 20 MG tablet Take 20 mg by mouth at bedtime.    Current Facility-Administered Medications (Other)  Medication Route  . 0.9 %  sodium chloride infusion Intravenous      REVIEW OF SYSTEMS: ROS    Positive for: Cardiovascular, Eyes, Respiratory   Negative for: Constitutional, Gastrointestinal, Neurological, Skin, Genitourinary, Musculoskeletal, HENT, Endocrine, Psychiatric, Allergic/Imm, Heme/Lymph   Last edited by Roselee Nova D, COT on 03/28/2021  8:56 AM. (History)       ALLERGIES Allergies  Allergen Reactions  . Ciprofloxacin Other (See Comments)    Reaction unknown  . Clindamycin/Lincomycin Other (See Comments)    Reaction unknown   . Codeine Nausea And Vomiting  .  Diclofenac Other (See Comments)    Reaction unknown   . Fenoprofen Calcium Other (See Comments)    Reaction unknown   . Flexeril [Cyclobenzaprine Hcl] Other (See Comments)    Reaction unknown   . Hydrocodone Other (See Comments)    "Last time it was too strong and messed up my mind"  . Macrobid [Nitrofurantoin] Other (See Comments)    Reaction unknown   . Noroxin [Norfloxacin] Other (See Comments)    Reaction unknown   . Relafen [Nabumetone] Other (See Comments)    Reaction unknown   . Sulfa Antibiotics Other (See Comments)    Reaction unknown     PAST MEDICAL HISTORY Past Medical History:  Diagnosis Date  . Anxiety   . Arthritis   . Breast cancer ([place]) 2006   right breast  . Chronic cough   . Chronic UTI   . Depression   . Dysrhythmia   . Esophageal reflux   . Esophageal stricture   . Family history of malignant neoplasm of gastrointestinal tract   . GERD (gastroesophageal reflux disease)   . Hearing loss   . Hemorrhoids    internal  . History of hiatal hernia   . Hypercholesterolemia   . Hypertension   . Numbness and tingling    feeet, occasionally  . Osteopenia   . Personal history of colonic polyps 02/09/2007   TUBULAR ADENOMA  . Personal history of radiation therapy   . PONV (postoperative nausea and vomiting)    trouble opening mouth and jaw, jaw pops, trouble turning head  . Rotator cuff tear    RT  . Status post dilation of esophageal narrowing   . Umbilical hernia    Past Surgical History:  Procedure Laterality Date  . BREAST LUMPECTOMY  2006   right lumpectomy  . CATARACT EXTRACTION W/ INTRAOCULAR LENS IMPLANT Bilateral   . COLONOSCOPY    . CYSTOCELE REPAIR N/A 04/20/2016   Procedure: AUGMENTED ANTERIOR VAULT REPAIR, COLOPLAST DERMIS GRAFT KELLY PLICATION SACROSPINOUS FIXATION;  Surgeon: Carolan Clines, MD;  Location: [facility];  Service: Urology;  Laterality: N/A;  . DILATION AND CURETTAGE OF UTERUS    . ESOPHAGOGASTRODUODENOSCOPY ENDOSCOPY     . PUBOVAGINAL SLING N/A 04/20/2016   Procedure: PUBO-VAGINAL [name];  Surgeon: Carolan Clines, MD;  Location: [facility];  Service: Urology;  Laterality: N/A;  . REVISION URINARY SLING N/A 06/12/2016   Procedure: INCISION OF URETHRAL SLING;  Surgeon: Carolan Clines, MD;  Location: [facility];  Service: Urology;  Laterality: N/A;  1 HOUR  . SHOULDER OPEN ROTATOR CUFF REPAIR Right 02/28/2013   Procedure: RIGHT ROTATOR CUFF REPAIR SHOULDER OPEN WITH GRAFT AND ANCHORS;  Surgeon: Tobi Bastos, MD;  Location: [facility];  Service: Orthopedics;  Laterality: Right;  . UMBILICAL HERNIA REPAIR      FAMILY HISTORY Family History  Problem Relation Age of Onset  . Colon cancer Mother 81  . Kidney cancer Mother   . Glaucoma  Mother   . Heart disease Sister   . Stomach cancer Brother   . Stroke Sister   . Lung cancer Brother   . Clotting disorder Son   . Emphysema Father        smoker  . Hypertension Sister     SOCIAL HISTORY Social History   Tobacco Use  . Smoking status: Never Smoker  . Smokeless tobacco: Never Used  Substance Use Topics  . Alcohol use: No    Alcohol/week: 0.0 standard drinks  . Drug use: No         OPHTHALMIC EXAM:  Base Eye Exam    Visual Acuity (Snellen - Linear)      Right Left   Dist cc 20/25 20/30 +2   Dist ph cc 20/20 -2 NI       Tonometry (Tonopen, 9:01 AM)      Right Left   Pressure 09 09       Pupils      Dark Light Shape React APD   Right 3 2 Round Brisk None   Left 3 2 Round Brisk None       Visual Fields (Counting fingers)      Left Right    Full Full       Extraocular Movement      Right Left    Full, Ortho Full, Ortho       Neuro/Psych    Oriented x3: Yes   Mood/Affect: Normal       Dilation    Both eyes: 1.0% Mydriacyl, 2.5% Phenylephrine @ 9:01 AM        Slit Lamp and Fundus Exam    Slit Lamp Exam      Right Left   Lids/Lashes Dermatochalasis - upper lid, Meibomian gland dysfunction, mild Ptosis  Dermatochalasis - upper lid, Meibomian gland dysfunction   Conjunctiva/Sclera White and quiet White and quiet   Cornea arcus, well healed cataract wound, mild tear film debris Mild arcus, well healed cataract wound 1+ Punctate epithelial erosions   Anterior Chamber Deep and quiet Deep and quiet   Iris Round and dilated Round and dilated   Lens PC IOL in good position, open PC PC IOL in good position, open PC   Vitreous Vitreous syneresis Vitreous syneresis, Posterior vitreous detachment       Fundus Exam      Right Left   Disc mild Pallor, Sharp rim, focal PPA ST disc mild Pallor, Sharp rim, focal PPA ST disc   C/D Ratio 0.3 0.3   Macula Flat, Blunted foveal reflex, RPE mottling and clumping, No heme or edema Flat, good foveal reflex, RPE mottling and clumping, focal punctate flame heme SN mac, CWS superior mac, No heme or edema   Vessels attenuated, mild tortuousity attenuated   Periphery Attached    Attached           Refraction    Manifest Refraction      Sphere Cylinder Axis Dist VA   Right -1.75 +1.50 175 20/25   Left -2.00 +1.50 168 20/25-2          IMAGING AND PROCEDURES  Imaging and Procedures for 03/28/2021  OCT, Retina - OU - Both Eyes       Right Eye Quality was good. Central Foveal Thickness: 273. Progression has been stable. Findings include normal foveal contour, no IRF, no SRF, outer retinal atrophy (Peripapillary ORA and SRHM).   Left Eye Quality was good. Central Foveal Thickness: 271. Progression has been stable.  Findings include normal foveal contour, no IRF, no SRF.   Notes *Images captured and stored on drive  Diagnosis / Impression:  NFP; no IRF/SRF OU *history of BRVO OS s/p IVA  Clinical management:  See below  Abbreviations: NFP - Normal foveal profile. CME - cystoid macular edema. PED - pigment epithelial detachment. IRF - intraretinal fluid. SRF - subretinal fluid. EZ - ellipsoid zone. ERM - epiretinal membrane. ORA - outer retinal  atrophy. ORT - outer retinal tubulation. SRHM - subretinal hyper-reflective material. IRHM - intraretinal hyper-reflective material                 ASSESSMENT/PLAN:    ICD-10-CM   1. Branch retinal vein occlusion of left eye with macular edema  H34.8320   2. Retinal edema  H35.81 OCT, Retina - OU - Both Eyes  3. Essential hypertension  I10   4. Hypertensive retinopathy of both eyes  H35.033   5. Pseudophakia, both eyes  Z96.1     1,2. BRVO w/ CME OS  - followed by Dr. Zigmund Daniel  - s/p IVA OS (05.18.22) -- on q5 wk schedule  - pt presents today with persistent floater since last injection, per pt report floater has improved since initial onset and is apparently resolved today - BCVA 20/30 -- stable - no vitreous opacities or bubbles or new retinal pathologies - F/U with Dr. Zigmund Daniel as scheduled  3,4. Hypertensive retinopathy OU - discussed importance of tight BP control - monitor  5. Pseudophakia OU  - s/p CE/IOL OU  - IOL in good position, doing well  - monitor   Ophthalmic Meds Ordered this visit:  No orders of the defined types were placed in this encounter.      Return for f/u as schedule with Dr. Zigmund Daniel.  There are no Patient Instructions on file for this visit.   Explained the diagnoses, plan, and follow up with the patient and they expressed understanding.  Patient expressed understanding of the importance of proper follow up care.   This document serves as a record of services personally performed by Gardiner Sleeper, MD, PhD. It was created on their behalf by San Jetty. Owens Shark, OA an ophthalmic technician. The creation of this record is the provider's dictation and/or activities during the visit.    Electronically signed by: San Jetty. Owens Shark, [place] 05.27.2022 3:57 PM   Gardiner Sleeper, M.D., Ph.D. Diseases & Surgery of the Retina and Vitreous [facility]  I have reviewed the above documentation for accuracy and completeness, and I  agree with the above. Gardiner Sleeper, M.D., Ph.D. 03/28/21 3:57 PM   Abbreviations: M myopia (nearsighted); A astigmatism; H hyperopia (farsighted); P presbyopia; Mrx spectacle prescription;  CTL contact lenses; OD right eye; OS left eye; OU both eyes  XT exotropia; ET esotropia; PEK punctate epithelial keratitis; PEE punctate epithelial erosions; DES dry eye syndrome; MGD meibomian gland dysfunction; ATs artificial tears; PFAT's preservative free artificial tears; [place] nuclear sclerotic cataract; PSC posterior subcapsular cataract; ERM epi-retinal membrane; PVD posterior vitreous detachment; RD retinal detachment; DM diabetes mellitus; DR diabetic retinopathy; NPDR non-proliferative diabetic retinopathy; PDR proliferative diabetic retinopathy; CSME clinically significant macular edema; DME diabetic macular edema; dbh dot blot hemorrhages; CWS cotton wool spot; POAG primary open angle glaucoma; C/D cup-to-disc ratio; HVF humphrey visual field; GVF goldmann visual field; OCT optical coherence tomography; IOP intraocular pressure; BRVO Branch retinal vein occlusion; CRVO central retinal vein occlusion; CRAO central retinal artery occlusion; BRAO branch retinal artery  occlusion; RT retinal tear; SB scleral buckle; PPV pars plana vitrectomy; VH Vitreous hemorrhage; PRP panretinal laser photocoagulation; IVK intravitreal kenalog; VMT vitreomacular traction; MH Macular hole;  NVD neovascularization of the disc; NVE neovascularization elsewhere; AREDS age related eye disease study; ARMD age related macular degeneration; POAG primary open angle glaucoma; EBMD epithelial/anterior basement membrane dystrophy; ACIOL anterior chamber intraocular lens; IOL intraocular lens; PCIOL posterior chamber intraocular lens; Phaco/IOL phacoemulsification with intraocular lens placement; [place] photorefractive keratectomy; LASIK laser assisted in situ keratomileusis; HTN hypertension; DM diabetes mellitus; COPD chronic obstructive  pulmonary disease

## 2021-04-23 ENCOUNTER — Encounter (INDEPENDENT_AMBULATORY_CARE_PROVIDER_SITE_OTHER): Payer: Medicare HMO | Admitting: Ophthalmology

## 2021-04-23 ENCOUNTER — Other Ambulatory Visit: Payer: Self-pay

## 2021-04-23 DIAGNOSIS — H43813 Vitreous degeneration, bilateral: Secondary | ICD-10-CM

## 2021-04-23 DIAGNOSIS — H353211 Exudative age-related macular degeneration, right eye, with active choroidal neovascularization: Secondary | ICD-10-CM | POA: Diagnosis not present

## 2021-04-23 DIAGNOSIS — H35033 Hypertensive retinopathy, bilateral: Secondary | ICD-10-CM

## 2021-04-23 DIAGNOSIS — H34832 Tributary (branch) retinal vein occlusion, left eye, with macular edema: Secondary | ICD-10-CM

## 2021-04-23 DIAGNOSIS — I10 Essential (primary) hypertension: Secondary | ICD-10-CM | POA: Diagnosis not present

## 2021-05-06 NOTE — Progress Notes (Signed)
Cardiology Office Note:    Date:  05/07/2021   ID:  Natalie West, DOB 10-29-1931, MRN [id number]  PCP:  Christain Sacramento, MD Referring MD: Christain Sacramento, [place]  Cardiologist:  Shelva Majestic, MD   Reason for visit: Shortness of breath, leg swelling  History of Present Illness:    Natalie West is a 87 y.o. female with a hx of paroxysmal atrial fibrillation, falls, episodes of dizziness, HTN, HLD, GERD, anxiety, and hx of breast cancer. In review of her falls, Dr. Claiborne Billings and the patient decided to forgo anticoagulation and initiated ASA alone.  She was last seen by Fabian Sharp PA on 07/2020 and was doing well on reduced dose of atenolol 12.5mg .  She saw her PCP office on 04/07/2021 and was noted to be short of breath, leg swelling and orthopnea.  BNP 245, K 4.3, Cr 1.1.  She was started on lasix 20mg  daily and referred to our office.    Today, she is in the office alone.  She states she lives with her great nephew.  Her son lives in a trailer behind them and is a smoker.  She states from a A-fib prescriptive, she is doing well, infrequent heart racing, denies recent bleeding issues, no lightheadedness/syncope.  She did have a fall in April 2022 she attributes to tripping over her cat that was in between her legs.  She hit her head and required stitching, CT head neg for bleed.    She notes SOB when walking from her bed to the portable commode.  She states it has not gotten worse over the past month.  She thinks her LE edema has improved after starting lasix.  She denies PND/orthopnea and weight gain.  She notes chronic cough.  Denies recent fever, muscle aches, congestion/ other viral symptoms.     Past Medical History:  Diagnosis Date   Anxiety    Arthritis    Breast cancer ([place]) 2006   right breast   Chronic cough    Chronic UTI    Depression    Dysrhythmia    Esophageal reflux    Esophageal stricture    Family history of malignant neoplasm of  gastrointestinal tract    GERD (gastroesophageal reflux disease)    Hearing loss    Hemorrhoids    internal   History of hiatal hernia    Hypercholesterolemia    Hypertension    Numbness and tingling    feeet, occasionally   Osteopenia    Personal history of colonic polyps 02/09/2007   TUBULAR ADENOMA   Personal history of radiation therapy    PONV (postoperative nausea and vomiting)    trouble opening mouth and jaw, jaw pops, trouble turning head   Rotator cuff tear    RT   Status post dilation of esophageal narrowing    Umbilical hernia     Past Surgical History:  Procedure Laterality Date   BREAST LUMPECTOMY  2006   right lumpectomy   CATARACT EXTRACTION W/ INTRAOCULAR LENS IMPLANT Bilateral    COLONOSCOPY     CYSTOCELE REPAIR N/A 04/20/2016   Procedure: AUGMENTED ANTERIOR VAULT REPAIR, COLOPLAST DERMIS GRAFT KELLY PLICATION SACROSPINOUS FIXATION;  Surgeon: Carolan Clines, MD;  Location: [facility];  Service: Urology;  Laterality: N/A;   DILATION AND CURETTAGE OF UTERUS     ESOPHAGOGASTRODUODENOSCOPY ENDOSCOPY     PUBOVAGINAL SLING N/A 04/20/2016   Procedure: PUBO-VAGINAL [name];  Surgeon: Carolan Clines, MD;  Location: [facility];  Service: Urology;  Laterality: N/A;   REVISION URINARY SLING N/A 06/12/2016   Procedure: INCISION OF URETHRAL SLING;  Surgeon: Carolan Clines, MD;  Location: [facility];  Service: Urology;  Laterality: N/A;  1 HOUR   SHOULDER OPEN ROTATOR CUFF REPAIR Right 02/28/2013   Procedure: RIGHT ROTATOR CUFF REPAIR SHOULDER OPEN WITH GRAFT AND ANCHORS;  Surgeon: Tobi Bastos, MD;  Location: [facility];  Service: Orthopedics;  Laterality: Right;   UMBILICAL HERNIA REPAIR      Current Medications: Current Meds  Medication Sig   acetaminophen (TYLENOL) 325 MG tablet Take 650 mg by mouth every 6 (six) hours as needed for moderate pain.   albuterol (PROVENTIL HFA;VENTOLIN HFA) 108 (90 Base) MCG/ACT inhaler Inhale 2 puffs into the lungs every 6 (six) hours  as needed for wheezing or shortness of breath.   amitriptyline (ELAVIL) 25 MG tablet 25 mg 2 (two) times daily.    aspirin EC 81 MG tablet Take 81 mg by mouth daily.   atenolol (TENORMIN) 25 MG tablet Take 0.5 tablets (12.5 mg total) by mouth daily.   Calcium Carb-Cholecalciferol (CALCIUM 600 + D PO) Take 1 tablet by mouth daily.   fluticasone (CUTIVATE) 0.005 % ointment Apply 1 application topically daily as needed (itching).   furosemide (LASIX) 40 MG tablet Take 1 tablet (40 mg total) by mouth daily.   hydrocortisone 2.5 % ointment Apply 2.5 mg topically as needed.   meclizine (ANTIVERT) 12.5 MG tablet Take 12.5 mg by mouth 2 (two) times daily.   meloxicam (MOBIC) 7.5 MG tablet Take 7.5 mg by mouth as needed.   Multiple Vitamin (MULTIVITAMIN WITH MINERALS) TABS tablet Take 1 tablet by mouth daily.   mupirocin ointment (BACTROBAN) 2 % Apply 2 g topically as needed.   omeprazole (PRILOSEC) 20 MG capsule Take 20 mg by mouth daily as needed (For heartburn or acid reflux.).    perphenazine (TRILAFON) 2 MG tablet Take 2 mg by mouth 2 (two) times daily.    polyethylene glycol powder (GLYCOLAX/MIRALAX) powder Take 17 g by mouth daily.   pravastatin (PRAVACHOL) 20 MG tablet Take 20 mg by mouth at bedtime.    [DISCONTINUED] furosemide (LASIX) 20 MG tablet Take 1 tablet by mouth daily.   [DISCONTINUED] hydrochlorothiazide (MICROZIDE) 12.5 MG capsule Take 12.5 mg by mouth daily.   Current Facility-Administered Medications for the 05/07/21 encounter (Office Visit) with Warren Lacy, PA-C  Medication   0.9 %  sodium chloride infusion     Allergies:   Ciprofloxacin, Clindamycin/lincomycin, Codeine, Diclofenac, Fenoprofen calcium, Flexeril [cyclobenzaprine hcl], Hydrocodone, Macrobid [nitrofurantoin], Noroxin [norfloxacin], Relafen [nabumetone], and Sulfa antibiotics   Social History   Socioeconomic History   Marital status: Widowed    Spouse name: Not on file   Number of children: 1   Years  of education: Not on file   Highest education level: Not on file  Occupational History   Occupation: retired    Fish farm manager: RETIRED  Tobacco Use   Smoking status: Never   Smokeless tobacco: Never  Substance and Sexual Activity   Alcohol use: No    Alcohol/week: 0.0 standard drinks   Drug use: No   Sexual activity: Never    Partners: Male  Other Topics Concern   Not on file  Social History Narrative   Not on file   Social Determinants of Health   Financial Resource Strain: Not on file  Food Insecurity: Not on file  Transportation Needs: Not on file  Physical Activity:  Not on file  Stress: Not on file  Social Connections: Not on file     Family History: The patient's family history includes Clotting disorder in her son; Colon cancer (age of onset: 18) in her mother; Emphysema in her father; Glaucoma in her mother; Heart disease in her sister; Hypertension in her sister; Kidney cancer in her mother; Lung cancer in her brother; Stomach cancer in her brother; Stroke in her sister.  ROS:   Please see the history of present illness.     EKGs/Labs/Other Studies Reviewed:    EKG:  The ekg ordered today demonstrates A-fib, HR 70, QRS 41ms.  Recent Labs: No results found for requested labs within last 8760 hours.  Recent Lipid Panel    Component Value Date/Time   CHOL 123 12/06/2019 1036   TRIG 67 12/06/2019 1036   HDL 55 12/06/2019 1036   CHOLHDL 2.2 12/06/2019 1036   LDLCALC 54 12/06/2019 1036    Physical Exam:    VS:  BP 112/68 (BP Location: Left Arm, Patient Position: Sitting, Cuff Size: Normal)   Pulse 70   Ht 5\' 2"  (1.575 m)   Wt 141 lb (64 kg)   LMP 11/03/1983   SpO2 97%   BMI 25.79 kg/m     Wt Readings from Last 3 Encounters:  05/07/21 141 lb (64 kg)  01/17/21 144 lb 6.4 oz (65.5 kg)  07/24/20 144 lb 9.6 oz (65.6 kg)     GEN:  Elderly, walking with a cane HEENT: Normal NECK: JVD to low neck at 90 degrees; No carotid bruits CARDIAC: irreg irreg, no  murmurs, rubs, gallops RESPIRATORY:  rales to mid left lung, right lung clear ABDOMEN: Soft, non-tender, non-distended MUSCULOSKELETAL: 1+ bilateral ankle edema SKIN: Warm and dry NEUROLOGIC:  Alert and oriented PSYCHIATRIC:  Normal affect   ASSESSMENT AND PLAN   1. Acute diastolic CHF  - Echo 11/9415: mod increased asymmetric LVH, EF 55-60%, no significant valve disease - New RX from PCP for lasix 20mg  on 04/07/2021.   - With dyspnea on minimal exertion and rales to mid left lung, increase lasix to 40mg  daily. - Check BNP, CMET, CBC with diff, CXR and echo.   - Follow-up with me in 2 weeks - will need to check BMET with increased lasix dose.  2. Longstanding persistent atrial fibrillation ([place]), rate controlled - Pt is not on anticoagulation given hx of dizziness and falls.  Continue ASA.   - Beta-blocker for rate control.  3. Hyperlipidemia, unspecified hyperlipidemia type - Continue pravastatin.  Check lipids when fasting.  4. Essential hypertension, BP controlled - Stop HCTZ given new Rx for lasix.  Monitor BP  Disposition: Follow-up volume status/breathing with repeat BMET in 2 weeks.       Medication Adjustments/Labs and Tests Ordered: Current medicines are reviewed at length with the patient today.  Concerns regarding medicines are outlined above.  Orders Placed This Encounter  Procedures   DG Chest 2 View   CBC w/Diff/Platelet   Brain natriuretic peptide   Comprehensive metabolic panel   EKG 40-CXKG   ECHOCARDIOGRAM COMPLETE   Meds ordered this encounter  Medications   furosemide (LASIX) 40 MG tablet    Sig: Take 1 tablet (40 mg total) by mouth daily.    Dispense:  90 tablet    Refill:  3    Patient Instructions  Medication Instructions:  Stop HCTZ. Start lasix 40 mg ( 1 Tablet Daily) *If you need a refill on your cardiac medications before your  next appointment, please call your pharmacy*   Lab Work: CBC/Diff,CMP,BNP If you have labs (blood work) drawn  today and your tests are completely normal, you will receive your results only by: [place] (if you have MyChart) OR A paper copy in the mail If you have any lab test that is abnormal or we need to change your treatment, we will call you to review the results.   Testing/Procedures: [facility] [address] A chest x-ray takes a picture of the organs and structures inside the chest, including the heart, lungs, and blood vessels. This test can show several things, including, whether the heart is enlarges; whether fluid is building up in the lungs; and whether pacemaker / defibrillator leads are still in place.   [address] [place] has requested that you have an echocardiogram. Echocardiography is a painless test that uses sound waves to create images of your heart. It provides your doctor with information about the size and shape of your heart and how well your heart's chambers and valves are working. This procedure takes approximately one hour. There are no restrictions for this procedure.   Follow-Up: At [facility], you and your health needs are our priority.  As part of our continuing mission to provide you with exceptional heart care, we have created designated Provider Care Teams.  These Care Teams include your primary Cardiologist (physician) and Advanced Practice Providers (APPs -  Physician Assistants and Nurse Practitioners) who all work together to provide you with the care you need, when you need it.  Your Next Appointment: 2 week(s)  The format for your next appointment:   In Person  Provider:   Caron Presume, PA-C Sheela Stack, NP     Signed, Warren Lacy, PA-C  05/07/2021 1:01 PM    [facility]

## 2021-05-07 ENCOUNTER — Other Ambulatory Visit: Payer: Self-pay

## 2021-05-07 ENCOUNTER — Ambulatory Visit: Payer: Medicare HMO | Admitting: Medical

## 2021-05-07 ENCOUNTER — Encounter: Payer: Self-pay | Admitting: Medical

## 2021-05-07 ENCOUNTER — Ambulatory Visit: Payer: Medicare HMO | Admitting: Physician Assistant

## 2021-05-07 VITALS — BP 112/68 | HR 70 | Ht 62.0 in | Wt 141.0 lb

## 2021-05-07 DIAGNOSIS — I5031 Acute diastolic (congestive) heart failure: Secondary | ICD-10-CM

## 2021-05-07 DIAGNOSIS — I10 Essential (primary) hypertension: Secondary | ICD-10-CM

## 2021-05-07 DIAGNOSIS — I4811 Longstanding persistent atrial fibrillation: Secondary | ICD-10-CM

## 2021-05-07 DIAGNOSIS — E785 Hyperlipidemia, unspecified: Secondary | ICD-10-CM

## 2021-05-07 HISTORY — DX: Acute diastolic (congestive) heart failure: I50.31

## 2021-05-07 MED ORDER — FUROSEMIDE 40 MG PO TABS: 40.0000 mg | ORAL_TABLET | Freq: Every day | ORAL | 3 refills | Status: DC

## 2021-05-07 NOTE — Patient Instructions (Signed)
Medication Instructions:  Stop HCTZ. Start lasix 40 mg ( 1 Tablet Daily) *If you need a refill on your cardiac medications before your next appointment, please call your pharmacy*   Lab Work: CBC/Diff,CMP,BNP If you have labs (blood work) drawn today and your tests are completely normal, you will receive your results only by: [place] (if you have MyChart) OR A paper copy in the mail If you have any lab test that is abnormal or we need to change your treatment, we will call you to review the results.   Testing/Procedures: [facility] [address] A chest x-ray takes a picture of the organs and structures inside the chest, including the heart, lungs, and blood vessels. This test can show several things, including, whether the heart is enlarges; whether fluid is building up in the lungs; and whether pacemaker / defibrillator leads are still in place.   [address] [place] has requested that you have an echocardiogram. Echocardiography is a painless test that uses sound waves to create images of your heart. It provides your doctor with information about the size and shape of your heart and how well your heart's chambers and valves are working. This procedure takes approximately one hour. There are no restrictions for this procedure.   Follow-Up: At [facility], you and your health needs are our priority.  As part of our continuing mission to provide you with exceptional heart care, we have created designated Provider Care Teams.  These Care Teams include your primary Cardiologist (physician) and Advanced Practice Providers (APPs -  Physician Assistants and Nurse Practitioners) who all work together to provide you with the care you need, when you need it.  Your Next Appointment: 2 week(s)  The format for your next appointment:   In Person  Provider:   Caron Presume, PA-C Sheela Stack, NP

## 2021-05-08 LAB — COMPREHENSIVE METABOLIC PANEL
ALT: 11 IU/L (ref 0–32)
AST: 23 IU/L (ref 0–40)
Albumin/Globulin Ratio: 2.2 (ref 1.2–2.2)
Albumin: 4.4 g/dL (ref 3.6–4.6)
Alkaline Phosphatase: 78 IU/L (ref 44–121)
BUN/Creatinine Ratio: 19 (ref 12–28)
BUN: 25 mg/dL (ref 8–27)
Bilirubin Total: 0.4 mg/dL (ref 0.0–1.2)
CO2: 28 mmol/L (ref 20–29)
Calcium: 8.6 mg/dL — ABNORMAL LOW (ref 8.7–10.3)
Chloride: 101 mmol/L (ref 96–106)
Creatinine, Ser: 1.3 mg/dL — ABNORMAL HIGH (ref 0.57–1.00)
Globulin, Total: 2 g/dL (ref 1.5–4.5)
Glucose: 100 mg/dL — ABNORMAL HIGH (ref 65–99)
Potassium: 3.4 mmol/L — ABNORMAL LOW (ref 3.5–5.2)
Sodium: 145 mmol/L — ABNORMAL HIGH (ref 134–144)
Total Protein: 6.4 g/dL (ref 6.0–8.5)
eGFR: 40 mL/min/{1.73_m2} — ABNORMAL LOW (ref >59)

## 2021-05-08 LAB — CBC WITH DIFFERENTIAL/PLATELET
Basophils Absolute: 0 10*3/uL (ref 0.0–0.2)
Basos: 1 %
EOS (ABSOLUTE): 0.3 10*3/uL (ref 0.0–0.4)
Eos: 3 %
Hematocrit: 36.8 % (ref 34.0–46.6)
Hemoglobin: 12.1 g/dL (ref 11.1–15.9)
Immature Grans (Abs): 0 10*3/uL (ref 0.0–0.1)
Immature Granulocytes: 1 %
Lymphocytes Absolute: 1.7 10*3/uL (ref 0.7–3.1)
Lymphs: 20 %
MCH: 31.6 pg (ref 26.6–33.0)
MCHC: 32.9 g/dL (ref 31.5–35.7)
MCV: 96 fL (ref 79–97)
Monocytes Absolute: 0.6 10*3/uL (ref 0.1–0.9)
Monocytes: 8 %
Neutrophils Absolute: 5.6 10*3/uL (ref 1.4–7.0)
Neutrophils: 67 %
Platelets: 168 10*3/uL (ref 150–450)
RBC: 3.83 x10E6/uL (ref 3.77–5.28)
RDW: 12.4 % (ref 11.7–15.4)
WBC: 8.3 10*3/uL (ref 3.4–10.8)

## 2021-05-08 LAB — BRAIN NATRIURETIC PEPTIDE: BNP: 170.3 pg/mL — ABNORMAL HIGH (ref 0.0–100.0)

## 2021-05-09 ENCOUNTER — Telehealth: Payer: Self-pay

## 2021-05-09 ENCOUNTER — Other Ambulatory Visit: Payer: Self-pay

## 2021-05-09 DIAGNOSIS — I10 Essential (primary) hypertension: Secondary | ICD-10-CM

## 2021-05-09 MED ORDER — POTASSIUM CHLORIDE CRYS ER 20 MEQ PO TBCR: 20.0000 meq | EXTENDED_RELEASE_TABLET | Freq: Every day | ORAL | 3 refills | Status: DC

## 2021-05-09 NOTE — Telephone Encounter (Addendum)
Attempted to call patient with results. Unable to leave message  ----- Message from Warren Lacy, PA-C sent at 05/08/2021  1:00 PM EDT ----- Your blood counts are good, no sign of infection.  Your potassium is slightly low.  Start potassium 82meq once daily Colletta [place] please send to her pharmacy).  We will check your potassium and kidney function in 2 weeks to ensure you are doing well on the water pill.  Please make sure you stopped hydrochlorothiazide (HCTZ) since we started lasix.  Your fluid marker has improved.

## 2021-05-09 NOTE — Telephone Encounter (Addendum)
Spoke with patient regarding Blood Test results . Advise patient of new Rx for potassium 20 meq daily will be sent to pharmacy. Also advised patient will need repeat blood test in 2 weeks  ----- Message from Warren Lacy, PA-C sent at 05/08/2021  1:00 PM EDT ----- Your blood counts are good, no sign of infection.  Your potassium is slightly low.  Start potassium 68meq once daily Colletta [place] please send to her pharmacy).  We will check your potassium and kidney function in 2 weeks to ensure you are doing well on the water pill.  Please make sure you stopped hydrochlorothiazide (HCTZ) since we started lasix.  Your fluid marker has improved.

## 2021-05-12 ENCOUNTER — Ambulatory Visit
Admission: RE | Admit: 2021-05-12 | Discharge: 2021-05-12 | Disposition: A | Discharge status: Home / Self Care | Payer: Medicare HMO | Source: Ambulatory Visit | Attending: Physician Assistant | Admitting: Physician Assistant

## 2021-05-12 DIAGNOSIS — I5031 Acute diastolic (congestive) heart failure: Secondary | ICD-10-CM

## 2021-05-12 DIAGNOSIS — E785 Hyperlipidemia, unspecified: Secondary | ICD-10-CM

## 2021-05-12 DIAGNOSIS — I10 Essential (primary) hypertension: Secondary | ICD-10-CM

## 2021-05-12 DIAGNOSIS — I4811 Longstanding persistent atrial fibrillation: Secondary | ICD-10-CM

## 2021-05-13 ENCOUNTER — Telehealth: Payer: Self-pay

## 2021-05-13 ENCOUNTER — Other Ambulatory Visit: Payer: Self-pay

## 2021-05-13 DIAGNOSIS — I272 Pulmonary hypertension, unspecified: Secondary | ICD-10-CM

## 2021-05-13 NOTE — Telephone Encounter (Addendum)
Spoke with patient regarding results advised will send referral to pulmonary----- Message from Warren Lacy, PA-C sent at 05/13/2021 12:35 PM EDT ----- Evidence of emphysema on chest x-ray.  No pneumonia or pleural effusion (fluid in her lung).  Recommend referral to pulmonary for evaluation.  I think her shortness of breath limits her activity and she would benefit from seeing a lung doctor.     Colletta [place] - can you send a referral to pulmonology?

## 2021-05-20 NOTE — Progress Notes (Addendum)
Cardiology Office Note:    Date:  05/21/2021   ID:  Natalie West, DOB 14-Sep-1931, MRN [id number]  PCP:  Christain Sacramento, MD Referring MD: Christain Sacramento, [place]  Cardiologist:  Shelva Majestic, MD   Reason for visit: Follow-up SOB  History of Present Illness:    Natalie West is a 87 y.o. female with a hx of paroxysmal atrial fibrillation, falls, episodes of dizziness, HTN, HLD, GERD, anxiety, and hx of breast cancer. In review of her falls, Dr. Claiborne Billings and the patient decided to forgo anticoagulation and initiated ASA alone.  She saw her PCP office on 04/07/2021 and was noted to be short of breath, leg swelling and orthopnea.  BNP 245, K 4.3, Cr 1.1.  She was started on lasix 20mg  daily and referred to our office.  I last saw her on 05/07/21 when she noted significant shortness of breath when walking a short distance.  At this visit, with rales in the left lung, we increased her Lasix to 40 mg daily.  Cr 7/6 was 1.3.  Chest x-ray 7/11 showed chronic attenuation of the left upper lobe, emphysematous changes with scattered areas of atelectasis in the mid to lower lungs, no acute infiltrate or pleural effusion.  Today, she comes to clinic alone.  She states no improvement in her shortness of breath since her last visit.  She does not think she is peeing anymore with the increased dose of Lasix.  She notes shortness of breath over the last 2 years.  An abnormal lung exam was noted by her oncologist back in March 2022.  Chest x-ray then was unremarkable.  Patient mentions worse shortness of breath with bending over and laying down.  She typically uses 2 pillows.  She will note shortness of breath after she walks to the commode and gets back in bed.  She denies PND, chest pain and bleeding issues.  She has no more lightheadedness than her usual.  She takes meclizine for vertigo.  She does think she is eating better since her last visit with me.  She has minimal edema above the  ankle.  She does not have any TED hose at home.  She thinks her initial blood pressure was elevated this morning due to rushing to her appointment.  She notices issues with balance and has a history of falls.    Past Medical History:  Diagnosis Date   Anxiety    Arthritis    Breast cancer ([place]) 2006   right breast   Chronic cough    Chronic UTI    Depression    Dysrhythmia    Esophageal reflux    Esophageal stricture    Family history of malignant neoplasm of gastrointestinal tract    GERD (gastroesophageal reflux disease)    Hearing loss    Hemorrhoids    internal   History of hiatal hernia    Hypercholesterolemia    Hypertension    Numbness and tingling    feeet, occasionally   Osteopenia    Personal history of colonic polyps 02/09/2007   TUBULAR ADENOMA   Personal history of radiation therapy    PONV (postoperative nausea and vomiting)    trouble opening mouth and jaw, jaw pops, trouble turning head   Rotator cuff tear    RT   Status post dilation of esophageal narrowing    Umbilical hernia     Past Surgical History:  Procedure Laterality Date  BREAST LUMPECTOMY  2006   right lumpectomy   CATARACT EXTRACTION W/ INTRAOCULAR LENS IMPLANT Bilateral    COLONOSCOPY     CYSTOCELE REPAIR N/A 04/20/2016   Procedure: AUGMENTED ANTERIOR VAULT REPAIR, COLOPLAST DERMIS GRAFT KELLY PLICATION SACROSPINOUS FIXATION;  Surgeon: Carolan Clines, MD;  Location: [facility];  Service: Urology;  Laterality: N/A;   DILATION AND CURETTAGE OF UTERUS     ESOPHAGOGASTRODUODENOSCOPY ENDOSCOPY     PUBOVAGINAL SLING N/A 04/20/2016   Procedure: PUBO-VAGINAL [name];  Surgeon: Carolan Clines, MD;  Location: [facility];  Service: Urology;  Laterality: N/A;   REVISION URINARY SLING N/A 06/12/2016   Procedure: INCISION OF URETHRAL SLING;  Surgeon: Carolan Clines, MD;  Location: [facility];  Service: Urology;  Laterality: N/A;  1 HOUR   SHOULDER OPEN ROTATOR CUFF REPAIR Right 02/28/2013   Procedure:  RIGHT ROTATOR CUFF REPAIR SHOULDER OPEN WITH GRAFT AND ANCHORS;  Surgeon: Tobi Bastos, MD;  Location: [facility];  Service: Orthopedics;  Laterality: Right;   UMBILICAL HERNIA REPAIR      Current Medications: Current Meds  Medication Sig   acetaminophen (TYLENOL) 325 MG tablet Take 650 mg by mouth every 6 (six) hours as needed for moderate pain.   albuterol (PROVENTIL HFA;VENTOLIN HFA) 108 (90 Base) MCG/ACT inhaler Inhale 2 puffs into the lungs every 6 (six) hours as needed for wheezing or shortness of breath.   amitriptyline (ELAVIL) 25 MG tablet 25 mg 2 (two) times daily.    aspirin EC 81 MG tablet Take 81 mg by mouth daily.   atenolol (TENORMIN) 25 MG tablet Take 0.5 tablets (12.5 mg total) by mouth daily.   Calcium Carb-Cholecalciferol (CALCIUM 600 + D PO) Take 1 tablet by mouth daily.   fluticasone (CUTIVATE) 0.005 % ointment Apply 1 application topically daily as needed (itching).   furosemide (LASIX) 40 MG tablet Take 1 tablet (40 mg total) by mouth daily.   hydrocortisone 2.5 % ointment Apply 2.5 mg topically as needed.   meclizine (ANTIVERT) 12.5 MG tablet Take 12.5 mg by mouth 2 (two) times daily.   meloxicam (MOBIC) 7.5 MG tablet Take 7.5 mg by mouth as needed.   Multiple Vitamin (MULTIVITAMIN WITH MINERALS) TABS tablet Take 1 tablet by mouth daily.   mupirocin ointment (BACTROBAN) 2 % Apply 2 g topically as needed.   omeprazole (PRILOSEC) 20 MG capsule Take 20 mg by mouth daily as needed (For heartburn or acid reflux.).    perphenazine (TRILAFON) 2 MG tablet Take 2 mg by mouth 2 (two) times daily.    polyethylene glycol powder (GLYCOLAX/MIRALAX) powder Take 17 g by mouth daily. As Needed   potassium chloride SA (KLOR-CON M20) 20 MEQ tablet Take 1 tablet (20 mEq total) by mouth daily.   pravastatin (PRAVACHOL) 20 MG tablet Take 20 mg by mouth at bedtime.    Current Facility-Administered Medications for the 05/21/21 encounter (Office Visit) with Warren Lacy, PA-C   Medication   0.9 %  sodium chloride infusion     Allergies:   Ciprofloxacin, Clindamycin/lincomycin, Codeine, Diclofenac, Fenoprofen calcium, Flexeril [cyclobenzaprine hcl], Hydrocodone, Macrobid [nitrofurantoin], Noroxin [norfloxacin], Relafen [nabumetone], and Sulfa antibiotics   Social History   Socioeconomic History   Marital status: Widowed    Spouse name: Not on file   Number of children: 1   Years of education: Not on file   Highest education level: Not on file  Occupational History   Occupation: retired    Fish farm manager: RETIRED  Tobacco Use   Smoking status: Never  Smokeless tobacco: Never  Substance and Sexual Activity   Alcohol use: No    Alcohol/week: 0.0 standard drinks   Drug use: No   Sexual activity: Never    Partners: Male  Other Topics Concern   Not on file  Social History Narrative   Not on file   Social Determinants of Health   Financial Resource Strain: Not on file  Food Insecurity: Not on file  Transportation Needs: Not on file  Physical Activity: Not on file  Stress: Not on file  Social Connections: Not on file     Family History: The patient's family history includes Clotting disorder in her son; Colon cancer (age of onset: 79) in her mother; Emphysema in her father; Glaucoma in her mother; Heart disease in her sister; Hypertension in her sister; Kidney cancer in her mother; Lung cancer in her brother; Stomach cancer in her brother; Stroke in her sister.  ROS:   Please see the history of present illness.     EKGs/Labs/Other Studies Reviewed:    Recent Labs: 05/07/2021: ALT 11; BNP 170.3; BUN 25; Creatinine, Ser 1.30; Hemoglobin 12.1; Platelets 168; Potassium 3.4; Sodium 145  Recent Lipid Panel    Component Value Date/Time   CHOL 123 12/06/2019 1036   TRIG 67 12/06/2019 1036   HDL 55 12/06/2019 1036   CHOLHDL 2.2 12/06/2019 1036   LDLCALC 54 12/06/2019 1036    Physical Exam:    VS:  BP (!) 150/90 (BP Location: Left Arm)   Pulse 72    Ht 5\' 2"  (1.575 m)   Wt 140 lb 9.6 oz (63.8 kg)   LMP 11/03/1983   SpO2 92%   BMI 25.72 kg/m     Wt Readings from Last 3 Encounters:  05/21/21 140 lb 9.6 oz (63.8 kg)  05/07/21 141 lb (64 kg)  01/17/21 144 lb 6.4 oz (65.5 kg)     GEN:  elderly, frail appearing HEENT: Normal NECK: No JVD; No carotid bruits CARDIAC: irreg irreg, no murmurs, rubs, gallops RESPIRATORY: crackles in lower-mid left lung, right lung clear  ABDOMEN: Soft, non-tender, non-distended MUSCULOSKELETAL: minimal edema from lower shin to ankle bilaterally SKIN: Warm and dry NEUROLOGIC:  Alert and oriented PSYCHIATRIC:  Normal affect   ASSESSMENT AND PLAN   1. Acute diastolic CHF, minimally hypervolemic on exam - Echo 12/2019: mod increased asymmetric LVH, EF 55-60%, no significant valve disease - Repeat echo scheduled for 8/1. - Decrease lasix back to 20mg  daily secondary to no change in her breathing as well as her rising creatinine. - Check BMET today to ensure no kidney injury with lasix and check K now on potassium supplementation.   - Gave information on compression stockings for her lower extremity edema and venous stasis. - Referred to pulmonary for persistent shortness of breath and findings of emphysema/atelectasis on her chest x-ray.  Recommended she also follow-up with her PCP.   2. Longstanding persistent atrial fibrillation ([place]), rate controlled - Pt is not on anticoagulation given hx of dizziness and falls.  Continue ASA.   - Beta-blocker for rate control.   3. Hyperlipidemia, unspecified hyperlipidemia type - Continue pravastatin.   - Check lipids today.   4. Essential hypertension, BP controlled - Recently stopped HCTZ given new Rx for lasix.   - BP acceptable today, check BP 130/78.  Continue current meds.  5. Weakness/history of falls - Referred to physical therapy  Disposition: Follow-up 3 months with myself or Dr. Claiborne Billings.    Medication Adjustments/Labs and Tests Ordered: Current  medicines are reviewed at length with the patient today.  Concerns regarding medicines are outlined above.  Orders Placed This Encounter  Procedures   Basic metabolic panel   Lipid Profile   Ambulatory referral to Physical Therapy   No orders of the defined types were placed in this encounter.   Patient Instructions  Medication Instructi FUROSEMIDE(LASIX)20MG  DAILY  *If you need a refill on your cardiac medications before your next appointment, please call your pharmacy*  Lab Work: BMET AND LIPID TODAY If you have labs (blood work) drawn today and your tests are completely normal, you will receive your results only by:  [place] (if you have MyChart) OR A paper copy in the mail.  If you have any lab test that is abnormal or we need to change your treatment, we will call you to review the results. You may go to any [facility] that is convenient for you however, we do have a lab in our office that is able to assist you. You DO NOT need an appointment for our lab. The lab is open 8:00am and closes at 4:00pm. Lunch 12:45 - 1:45pm.  Testing/Procedures: REFERRAL TO PHYSICAL THERAPY  Special Instructions PLEASE READ AND FOLLOW SALTY 6-ATTACHED-1,800 mg daily  PLEASE PURCHASE AND WEAR COMPRESSION STOCKINGS DAILY AND TAKE OFF AT BEDTIME. Compression stockings are elastic socks that squeeze the legs. They help to increase blood flow to the legs and to decrease swelling in the legs from fluid retention, and reduce the chance of developing blood clots in the lower legs. Please put on in the AM when dressing and off at night when dressing for bed.  LET THEM KNOW THAT YOU NEED KNEE HIGH'S WITH COMPRESSION OF 15-20 mmhg.  [facility];  [place] ([place] [phone]); [place], [place] [address]; [phone]  EMAIL   [email or web address].  PLEASE MAKE SURE TO ELEVATE YOUR FEET & LEGS WHILE SITTING, THIS WILL HELP WITH THE SWELLING ALSO.   Follow-Up: Your next appointment:  3  month(s) In Person with Shelva Majestic, MD OR IF UNAVAILABLE Caron Presume, PA-C  At [facility], you and your health needs are our priority.  As part of our continuing mission to provide you with exceptional heart care, we have created designated Provider Care Teams.  These Care Teams include your primary Cardiologist (physician) and Advanced Practice Providers (APPs -  Physician Assistants and Nurse Practitioners) who all work together to provide you with the care you need, when you need it.    Signed, Warren Lacy, PA-C  05/21/2021 10:10 AM    [facility]

## 2021-05-21 ENCOUNTER — Other Ambulatory Visit: Payer: Self-pay

## 2021-05-21 ENCOUNTER — Ambulatory Visit (INDEPENDENT_AMBULATORY_CARE_PROVIDER_SITE_OTHER): Payer: Medicare HMO | Admitting: Physician Assistant

## 2021-05-21 ENCOUNTER — Encounter: Payer: Self-pay | Admitting: General Practice

## 2021-05-21 VITALS — BP 130/78 | HR 72 | Ht 62.0 in | Wt 140.6 lb

## 2021-05-21 DIAGNOSIS — E785 Hyperlipidemia, unspecified: Secondary | ICD-10-CM | POA: Diagnosis not present

## 2021-05-21 DIAGNOSIS — R296 Repeated falls: Secondary | ICD-10-CM

## 2021-05-21 DIAGNOSIS — Z79899 Other long term (current) drug therapy: Secondary | ICD-10-CM

## 2021-05-21 DIAGNOSIS — R531 Weakness: Secondary | ICD-10-CM

## 2021-05-21 DIAGNOSIS — I4811 Longstanding persistent atrial fibrillation: Secondary | ICD-10-CM

## 2021-05-21 DIAGNOSIS — I5031 Acute diastolic (congestive) heart failure: Secondary | ICD-10-CM

## 2021-05-21 DIAGNOSIS — I10 Essential (primary) hypertension: Secondary | ICD-10-CM | POA: Diagnosis not present

## 2021-05-21 LAB — LIPID PANEL
Chol/HDL Ratio: 2.4 ratio (ref 0.0–4.4)
Cholesterol, Total: 119 mg/dL (ref 100–199)
HDL: 50 mg/dL (ref >39)
LDL Chol Calc (NIH): 55 mg/dL (ref 0–99)
Triglycerides: 69 mg/dL (ref 0–149)
VLDL Cholesterol Cal: 14 mg/dL (ref 5–40)

## 2021-05-21 LAB — BASIC METABOLIC PANEL
BUN/Creatinine Ratio: 15 (ref 12–28)
BUN: 17 mg/dL (ref 8–27)
CO2: 26 mmol/L (ref 20–29)
Calcium: 8.9 mg/dL (ref 8.7–10.3)
Chloride: 102 mmol/L (ref 96–106)
Creatinine, Ser: 1.15 mg/dL — ABNORMAL HIGH (ref 0.57–1.00)
Glucose: 78 mg/dL (ref 65–99)
Potassium: 4.4 mmol/L (ref 3.5–5.2)
Sodium: 142 mmol/L (ref 134–144)
eGFR: 46 mL/min/{1.73_m2} — ABNORMAL LOW (ref >59)

## 2021-05-21 NOTE — Patient Instructions (Signed)
Medication Instructi FUROSEMIDE(LASIX)20MG  DAILY  *If you need a refill on your cardiac medications before your next appointment, please call your pharmacy*  Lab Work: BMET AND LIPID TODAY If you have labs (blood work) drawn today and your tests are completely normal, you will receive your results only by:  [place] (if you have MyChart) OR A paper copy in the mail.  If you have any lab test that is abnormal or we need to change your treatment, we will call you to review the results. You may go to any [facility] that is convenient for you however, we do have a lab in our office that is able to assist you. You DO NOT need an appointment for our lab. The lab is open 8:00am and closes at 4:00pm. Lunch 12:45 - 1:45pm.  Testing/Procedures: REFERRAL TO PHYSICAL THERAPY  Special Instructions PLEASE READ AND FOLLOW SALTY 6-ATTACHED-1,800 mg daily  PLEASE PURCHASE AND WEAR COMPRESSION STOCKINGS DAILY AND TAKE OFF AT BEDTIME. Compression stockings are elastic socks that squeeze the legs. They help to increase blood flow to the legs and to decrease swelling in the legs from fluid retention, and reduce the chance of developing blood clots in the lower legs. Please put on in the AM when dressing and off at night when dressing for bed.  LET THEM KNOW THAT YOU NEED KNEE HIGH'S WITH COMPRESSION OF 15-20 mmhg.  [facility];  [place] ([place] [phone]); [place], [place] [address]; [phone]  EMAIL   [email or web address].  PLEASE MAKE SURE TO ELEVATE YOUR FEET & LEGS WHILE SITTING, THIS WILL HELP WITH THE SWELLING ALSO.   Follow-Up: Your next appointment:  3 month(s) In Person with Shelva Majestic, MD OR IF UNAVAILABLE Caron Presume, PA-C  At [facility], you and your health needs are our priority.  As part of our continuing mission to provide you with exceptional heart care, we have created designated Provider Care Teams.  These Care Teams include your primary Cardiologist  (physician) and Advanced Practice Providers (APPs -  Physician Assistants and Nurse Practitioners) who all work together to provide you with the care you need, when you need it.

## 2021-05-22 ENCOUNTER — Telehealth: Payer: Self-pay

## 2021-05-22 NOTE — Telephone Encounter (Addendum)
  Called patient with results.----- Message from Warren Lacy, PA-C sent at 05/22/2021  1:01 PM EDT ----- Kidney function is better and potassium is normal.  Your bad cholesterol is well controlled.  Continue current medications.

## 2021-05-28 ENCOUNTER — Other Ambulatory Visit: Payer: Self-pay

## 2021-05-28 ENCOUNTER — Encounter (INDEPENDENT_AMBULATORY_CARE_PROVIDER_SITE_OTHER): Payer: Medicare HMO | Admitting: Ophthalmology

## 2021-05-28 DIAGNOSIS — H353211 Exudative age-related macular degeneration, right eye, with active choroidal neovascularization: Secondary | ICD-10-CM

## 2021-05-28 DIAGNOSIS — H35033 Hypertensive retinopathy, bilateral: Secondary | ICD-10-CM | POA: Diagnosis not present

## 2021-05-28 DIAGNOSIS — H348322 Tributary (branch) retinal vein occlusion, left eye, stable: Secondary | ICD-10-CM | POA: Diagnosis not present

## 2021-05-28 DIAGNOSIS — H43813 Vitreous degeneration, bilateral: Secondary | ICD-10-CM

## 2021-05-28 DIAGNOSIS — I10 Essential (primary) hypertension: Secondary | ICD-10-CM | POA: Diagnosis not present

## 2021-05-29 ENCOUNTER — Telehealth: Payer: Self-pay | Admitting: Physician Assistant

## 2021-05-29 ENCOUNTER — Emergency Department (HOSPITAL_BASED_OUTPATIENT_CLINIC_OR_DEPARTMENT_OTHER): Payer: Medicare HMO | Admitting: Radiology

## 2021-05-29 ENCOUNTER — Emergency Department (HOSPITAL_BASED_OUTPATIENT_CLINIC_OR_DEPARTMENT_OTHER)
Admission: EM | Admit: 2021-05-29 | Discharge: 2021-05-29 | Disposition: A | Discharge status: Home / Self Care | Payer: Medicare HMO | Attending: Emergency Medicine | Admitting: Emergency Medicine

## 2021-05-29 ENCOUNTER — Encounter (HOSPITAL_BASED_OUTPATIENT_CLINIC_OR_DEPARTMENT_OTHER): Payer: Self-pay | Admitting: Emergency Medicine

## 2021-05-29 DIAGNOSIS — Z20822 Contact with and (suspected) exposure to covid-19: Secondary | ICD-10-CM | POA: Insufficient documentation

## 2021-05-29 DIAGNOSIS — I110 Hypertensive heart disease with heart failure: Secondary | ICD-10-CM | POA: Diagnosis not present

## 2021-05-29 DIAGNOSIS — I5031 Acute diastolic (congestive) heart failure: Secondary | ICD-10-CM | POA: Diagnosis not present

## 2021-05-29 DIAGNOSIS — Z853 Personal history of malignant neoplasm of breast: Secondary | ICD-10-CM | POA: Insufficient documentation

## 2021-05-29 DIAGNOSIS — R0602 Shortness of breath: Secondary | ICD-10-CM | POA: Diagnosis present

## 2021-05-29 DIAGNOSIS — Z7982 Long term (current) use of aspirin: Secondary | ICD-10-CM | POA: Insufficient documentation

## 2021-05-29 DIAGNOSIS — Z79899 Other long term (current) drug therapy: Secondary | ICD-10-CM | POA: Insufficient documentation

## 2021-05-29 DIAGNOSIS — I509 Heart failure, unspecified: Secondary | ICD-10-CM

## 2021-05-29 LAB — CBC WITH DIFFERENTIAL/PLATELET
Abs Immature Granulocytes: 0.03 10*3/uL (ref 0.00–0.07)
Basophils Absolute: 0 10*3/uL (ref 0.0–0.1)
Basophils Relative: 1 %
Eosinophils Absolute: 0.2 10*3/uL (ref 0.0–0.5)
Eosinophils Relative: 3 %
HCT: 34.1 % — ABNORMAL LOW (ref 36.0–46.0)
Hemoglobin: 11.5 g/dL — ABNORMAL LOW (ref 12.0–15.0)
Immature Granulocytes: 1 %
Lymphocytes Relative: 15 %
Lymphs Abs: 0.9 10*3/uL (ref 0.7–4.0)
MCH: 33.2 pg (ref 26.0–34.0)
MCHC: 33.7 g/dL (ref 30.0–36.0)
MCV: 98.6 fL (ref 80.0–100.0)
Monocytes Absolute: 0.5 10*3/uL (ref 0.1–1.0)
Monocytes Relative: 9 %
Neutro Abs: 4.5 10*3/uL (ref 1.7–7.7)
Neutrophils Relative %: 71 %
Platelets: 159 10*3/uL (ref 150–400)
RBC: 3.46 MIL/uL — ABNORMAL LOW (ref 3.87–5.11)
RDW: 13.5 % (ref 11.5–15.5)
WBC: 6.3 10*3/uL (ref 4.0–10.5)
nRBC: 0 % (ref 0.0–0.2)

## 2021-05-29 LAB — BRAIN NATRIURETIC PEPTIDE: B Natriuretic Peptide: 283.2 pg/mL — ABNORMAL HIGH (ref 0.0–100.0)

## 2021-05-29 LAB — COMPREHENSIVE METABOLIC PANEL
ALT: 7 U/L (ref 0–44)
AST: 17 U/L (ref 15–41)
Albumin: 3.8 g/dL (ref 3.5–5.0)
Alkaline Phosphatase: 59 U/L (ref 38–126)
Anion gap: 8 (ref 5–15)
BUN: 22 mg/dL (ref 8–23)
CO2: 29 mmol/L (ref 22–32)
Calcium: 9.1 mg/dL (ref 8.9–10.3)
Chloride: 104 mmol/L (ref 98–111)
Creatinine, Ser: 1.11 mg/dL — ABNORMAL HIGH (ref 0.44–1.00)
GFR, Estimated: 48 mL/min — ABNORMAL LOW (ref >60)
Glucose, Bld: 91 mg/dL (ref 70–99)
Potassium: 3.8 mmol/L (ref 3.5–5.1)
Sodium: 141 mmol/L (ref 135–145)
Total Bilirubin: 0.7 mg/dL (ref 0.3–1.2)
Total Protein: 6.2 g/dL — ABNORMAL LOW (ref 6.5–8.1)

## 2021-05-29 LAB — RESP PANEL BY RT-PCR (FLU A&B, COVID) ARPGX2
Influenza A by PCR: NEGATIVE
Influenza B by PCR: NEGATIVE
SARS Coronavirus 2 by RT PCR: NEGATIVE

## 2021-05-29 LAB — TROPONIN I (HIGH SENSITIVITY): Troponin I (High Sensitivity): 9 ng/L (ref <18)

## 2021-05-29 NOTE — ED Provider Notes (Signed)
[place] EMERGENCY DEPT Provider Note   CSN: [id number] Arrival date & time: 05/29/21  1005     History Chief Complaint  Patient presents with   Shortness of Breath    Natalie West is a 87 y.o. female.  HPI Patient reports increased shortness of breath and cough over the past 2 days.  She reports she got to feeling very short of breath last night.  She denies any chest pain.  She reports she always has some swelling in her right leg.  She notes slightly more swelling in both legs.  No fever.  No body aches.  No headache.  Patient reports she was exposed to potential COVID via her neighbors.  She reports she was outside talking to them but not in the home with them.  Patient reports she is vaccinated and has had 2 boosters.  Patient does have secondhand smoke exposure at home.  She herself is a non-smoker.  She does however indicate she has been given an inhaler in the past and did not find it very helpful.  She reports she has been told that she might have some emphysema as well.    Past Medical History:  Diagnosis Date   Anxiety    Arthritis    Breast cancer ([place]) 2006   right breast   Chronic cough    Chronic UTI    Depression    Dysrhythmia    Esophageal reflux    Esophageal stricture    Family history of malignant neoplasm of gastrointestinal tract    GERD (gastroesophageal reflux disease)    Hearing loss    Hemorrhoids    internal   History of hiatal hernia    Hypercholesterolemia    Hypertension    Numbness and tingling    feeet, occasionally   Osteopenia    Personal history of colonic polyps 02/09/2007   TUBULAR ADENOMA   Personal history of radiation therapy    PONV (postoperative nausea and vomiting)    trouble opening mouth and jaw, jaw pops, trouble turning head   Rotator cuff tear    RT   Status post dilation of esophageal narrowing    Umbilical hernia     Patient Active Problem List   Diagnosis Date Noted   Acute diastolic heart  failure ([place]) 05/07/2021   Mallet finger of left finger(s) 10/08/2016   Recurrent urinary tract infection 01/20/2015   Cystocele 01/20/2015   Constipation 08/22/2014   Rotator cuff (capsule) sprain 02/28/2013   Breast cancer of lower-outer quadrant of right female breast [facility])    Internal hemorrhoids 06/28/2011   Gastroenteritis 06/28/2011   Hyperlipidemia 02/18/2008   GERD 02/18/2008   HIATAL HERNIA 02/18/2008   OSTEOARTHRITIS 02/18/2008   COLONIC POLYPS, ADENOMATOUS 02/09/2007   DIVERTICULITIS, ACUTE 02/09/2007   ESOPHAGEAL STRICTURE 11/19/1998    Past Surgical History:  Procedure Laterality Date   BREAST LUMPECTOMY  2006   right lumpectomy   CATARACT EXTRACTION W/ INTRAOCULAR LENS IMPLANT Bilateral    COLONOSCOPY     CYSTOCELE REPAIR N/A 04/20/2016   Procedure: AUGMENTED ANTERIOR VAULT REPAIR, COLOPLAST DERMIS GRAFT KELLY PLICATION SACROSPINOUS FIXATION;  Surgeon: Carolan Clines, MD;  Location: [facility];  Service: Urology;  Laterality: N/A;   DILATION AND CURETTAGE OF UTERUS     ESOPHAGOGASTRODUODENOSCOPY ENDOSCOPY     PUBOVAGINAL SLING N/A 04/20/2016   Procedure: PUBO-VAGINAL [name];  Surgeon: Carolan Clines, MD;  Location: [facility];  Service: Urology;  Laterality: N/A;   REVISION URINARY SLING N/A  06/12/2016   Procedure: INCISION OF URETHRAL SLING;  Surgeon: Carolan Clines, MD;  Location: [facility];  Service: Urology;  Laterality: N/A;  1 HOUR   SHOULDER OPEN ROTATOR CUFF REPAIR Right 02/28/2013   Procedure: RIGHT ROTATOR CUFF REPAIR SHOULDER OPEN WITH GRAFT AND ANCHORS;  Surgeon: Tobi Bastos, MD;  Location: [facility];  Service: Orthopedics;  Laterality: Right;   UMBILICAL HERNIA REPAIR       OB History     Gravida  1   Para  1   Term      Preterm      AB      Living  1      SAB      IAB      Ectopic      Multiple      Live Births              Family History  Problem Relation Age of Onset   Colon cancer Mother 1   Kidney cancer  Mother    Glaucoma Mother    Heart disease Sister    Stomach cancer Brother    Stroke Sister    Lung cancer Brother    Clotting disorder Son    Emphysema Father        smoker   Hypertension Sister     Social History   Tobacco Use   Smoking status: Never   Smokeless tobacco: Never  Substance Use Topics   Alcohol use: No    Alcohol/week: 0.0 standard drinks   Drug use: No    Home Medications Prior to Admission medications   Medication Sig Start Date End Date Taking? Authorizing Provider  acetaminophen (TYLENOL) 325 MG tablet Take 650 mg by mouth every 6 (six) hours as needed for moderate pain.    [provider]  albuterol (PROVENTIL HFA;VENTOLIN HFA) 108 (90 Base) MCG/ACT inhaler Inhale 2 puffs into the lungs every 6 (six) hours as needed for wheezing or shortness of breath. 12/12/18   Gardenia Phlegm, NP  amitriptyline (ELAVIL) 25 MG tablet 25 mg 2 (two) times daily.  10/23/18   [provider]  aspirin EC 81 MG tablet Take 81 mg by mouth daily.    [provider]  atenolol (TENORMIN) 25 MG tablet Take 0.5 tablets (12.5 mg total) by mouth daily. 01/23/20   Troy Sine, MD  Calcium Carb-Cholecalciferol (CALCIUM 600 + D PO) Take 1 tablet by mouth daily.    [provider]  fluticasone (CUTIVATE) 0.005 % ointment Apply 1 application topically daily as needed (itching).    [provider]  furosemide (LASIX) 40 MG tablet Take 1 tablet (40 mg total) by mouth daily. 05/07/21 08/05/21  Warren Lacy, PA-C  hydrocortisone 2.5 % ointment Apply 2.5 mg topically as needed. 03/24/21   [provider]  meclizine (ANTIVERT) 12.5 MG tablet Take 12.5 mg by mouth 2 (two) times daily.    [provider]  meloxicam (MOBIC) 7.5 MG tablet Take 7.5 mg by mouth as needed.    [provider]  Multiple Vitamin (MULTIVITAMIN WITH MINERALS) TABS tablet Take 1 tablet by mouth daily.    [provider]  mupirocin  ointment (BACTROBAN) 2 % Apply 2 g topically as needed. 03/24/21   [provider]  omeprazole (PRILOSEC) 20 MG capsule Take 20 mg by mouth daily as needed (For heartburn or acid reflux.).     [provider]  perphenazine (TRILAFON) 2 MG tablet Take 2  mg by mouth 2 (two) times daily.  12/01/18   [provider]  polyethylene glycol powder (GLYCOLAX/MIRALAX) powder Take 17 g by mouth daily. As Needed    [provider]  potassium chloride SA (KLOR-CON M20) 20 MEQ tablet Take 1 tablet (20 mEq total) by mouth daily. 05/09/21 08/07/21  Warren Lacy, PA-C  pravastatin (PRAVACHOL) 20 MG tablet Take 20 mg by mouth at bedtime.     [provider]    Allergies    Ciprofloxacin, Clindamycin/lincomycin, Codeine, Diclofenac, Fenoprofen calcium, Flexeril [cyclobenzaprine hcl], Hydrocodone, Macrobid [nitrofurantoin], Noroxin [norfloxacin], Relafen [nabumetone], and Sulfa antibiotics  Review of Systems   Review of Systems 10 systems reviewed and negative except as per HPI Physical Exam Updated Vital Signs BP (!) 144/75   Pulse 71   Temp 98.8 F (37.1 C) (Oral)   Resp 16   Ht '5\' 2"'$  (1.575 m)   Wt 63.5 kg   LMP 11/03/1983   SpO2 97%   BMI 25.61 kg/m   Physical Exam Constitutional:      Comments: Alert nontoxic.  Mild tachypnea but no significant distress at rest.  HENT:     Mouth/Throat:     Pharynx: Oropharynx is clear.  Eyes:     Extraocular Movements: Extraocular movements intact.  Cardiovascular:     Rate and Rhythm: Normal rate and regular rhythm.  Pulmonary:     Comments: Mild tachypnea.  Crackles left lung base. Abdominal:     General: There is no distension.     Palpations: Abdomen is soft.     Tenderness: There is no abdominal tenderness. There is no guarding.  Musculoskeletal:     Cervical back: Neck supple.     Comments: 1+ pitting edema bilateral lower extremities.  Calves are nontender.  Skin:    General: Skin is warm and  dry.  Neurological:     General: No focal deficit present.     Mental Status: She is oriented to person, place, and time.    ED Results / Procedures / Treatments   Labs (all labs ordered are listed, but only abnormal results are displayed) Labs Reviewed  COMPREHENSIVE METABOLIC PANEL - Abnormal; Notable for the following components:      Result Value   Creatinine, Ser 1.11 (*)    Total Protein 6.2 (*)    GFR, Estimated 48 (*)    All other components within normal limits  BRAIN NATRIURETIC PEPTIDE - Abnormal; Notable for the following components:   B Natriuretic Peptide 283.2 (*)    All other components within normal limits  CBC WITH DIFFERENTIAL/PLATELET - Abnormal; Notable for the following components:   RBC 3.46 (*)    Hemoglobin 11.5 (*)    HCT 34.1 (*)    All other components within normal limits  RESP PANEL BY RT-PCR (FLU A&B, COVID) ARPGX2  TROPONIN I (HIGH SENSITIVITY)  TROPONIN I (HIGH SENSITIVITY)    EKG EKG Interpretation  Date/Time:  Thursday May 29 2021 10:12:18 EDT Ventricular Rate:  66 PR Interval:    QRS Duration: 93 QT Interval:  438 QTC Calculation: 459 R Axis:   -4 Text Interpretation: Atrial fibrillation Probable anterior infarct, age indeterminate anteriot t wave inversion new since 2009 Confirmed by Charlesetta Shanks [phone]) on 05/29/2021 11:27:47 AM  Radiology DG Chest 2 View  Result Date: 05/29/2021 CLINICAL DATA:  Shortness of breath EXAM: CHEST - 2 VIEW COMPARISON:  05/12/2021 FINDINGS: Mild cardiomegaly. Aortic atherosclerosis. Chronic changes/scarring throughout the lungs. No acute confluent opacities or  effusions. IMPRESSION: Chronic changes.  No active disease. Electronically Signed   By: Rolm Baptise M.D.   On: 05/29/2021 10:31    Procedures Procedures   Medications Ordered in ED Medications - No data to display  ED Course  I have reviewed the triage vital signs and the nursing notes.  Pertinent labs & imaging results that were  available during my care of the patient were reviewed by me and considered in my medical decision making (see chart for details).    MDM Rules/Calculators/A&P                           Patient presents as outlined.  After further history, she reports that her Lasix dose was decreased from 40 mg a day back down to 20 mg a day fairly recently.  At this time findings are most consistent with mild CHF exacerbation.  Patient has peripheral edema, slight crackles on pulmonary exam and mild elevation in BNP.  She is clinically well in appearance.  She has no acute distress.  COVID is negative.  Chest x-ray does not show any significant vascular congestion.  At this time patient stable to increase her Lasix dose at home.  Plan will be to go back to 40 mg a day.  I have reviewed this with patient.  I recommended follow-up for recheck of her electrolytes and response to treatment within 4 to 5 days. Final Clinical Impression(s) / ED Diagnoses Final diagnoses:  Acute on chronic congestive heart failure, unspecified heart failure type [facility])    Rx / [place] Orders ED Discharge Orders     None        Charlesetta Shanks, MD 05/29/21 1344

## 2021-05-29 NOTE — Telephone Encounter (Signed)
Natalie West is calling stating she is needing a hospital f/u on 08/02 with Caron Presume. I advised her we do not have this date available at this time and she insisted she needed this appointment on this date. She states they increased her Furosemide from 20 MG's to 40 MG's for 5 days and advised her to f/u with Anderson Malta after. I offered the first available 08/09 with Denyse Amass at [facility], but pt requested this message be sent. Please advise.

## 2021-05-29 NOTE — ED Triage Notes (Signed)
SOB and cough for several days. Exposed to covid. Brought in by EMS

## 2021-05-29 NOTE — Discharge Instructions (Signed)
1.  When you get home today, take a 40 mg dose of lasix.Go back to taking Lasix 40 mg a day.  You can take this as one dose in the morning or you can take 20 mg in the morning and 20 mg in the early afternoon.   2.  Schedule appointment for recheck with your heart doctor in 4 to 5 days . 3.  Return to the emergency department if you are getting increasingly short of breath, develop chest pain or other concerning symptoms

## 2021-05-29 NOTE — Telephone Encounter (Signed)
Spoke with the patient. She was calling to get a ED follow up. She stated that she only wants to see Caron Presume, PA. She has been advised that there were not any appointments available. She has been set up with Coletta Memos, NP on 8/9 but still wants a message sent to Caron Presume in case she can be worked in.

## 2021-06-02 ENCOUNTER — Other Ambulatory Visit: Payer: Self-pay

## 2021-06-02 ENCOUNTER — Ambulatory Visit (HOSPITAL_COMMUNITY): Payer: Medicare HMO | Attending: Internal Medicine

## 2021-06-02 DIAGNOSIS — I10 Essential (primary) hypertension: Secondary | ICD-10-CM | POA: Diagnosis present

## 2021-06-02 DIAGNOSIS — I5031 Acute diastolic (congestive) heart failure: Secondary | ICD-10-CM | POA: Diagnosis present

## 2021-06-02 DIAGNOSIS — I4811 Longstanding persistent atrial fibrillation: Secondary | ICD-10-CM | POA: Insufficient documentation

## 2021-06-02 DIAGNOSIS — E785 Hyperlipidemia, unspecified: Secondary | ICD-10-CM | POA: Diagnosis present

## 2021-06-02 LAB — ECHOCARDIOGRAM COMPLETE
P 1/2 time: 726 msec
S' Lateral: 1.9 cm

## 2021-06-03 ENCOUNTER — Telehealth: Payer: Self-pay

## 2021-06-03 NOTE — Telephone Encounter (Addendum)
Called patient regarding echo results advised Echo normal.----- Message from Warren Lacy, PA-C sent at 06/03/2021  3:10 PM EDT ----- Compared to 12/15/2019, no significant change. Normal pumping function, no significant valve disease, no fluid around the heart.

## 2021-06-08 NOTE — Progress Notes (Signed)
Cardiology Office Note:    Date:  06/10/2021   ID:  Natalie West, DOB [birth year], MRN [id number]  PCP:  Christain Sacramento, MD   [facility] Providers Cardiologist:  Shelva Majestic, MD      Referring MD: Christain Sacramento, MD   Follow-up for atrial fibrillation and diastolic CHF  History of Present Illness:    Natalie West is a 87 y.o. female with a hx of paroxysmal atrial fibrillation, falls, hypertension, GERD, hyperlipidemia, anxiety, breast cancer, and diastolic CHF.  Echocardiogram 12/15/2019 showed an LVEF of 55-60%, intermediate diastolic parameters, mildly enlarged RV, mildly elevated pulmonary artery pressures, severely dilated left atria, trivial mitral valve regurgitation, and mild aortic valve regurgitation.  She was seen by Dr. Claiborne Billings on 3/21.  Her atenolol was reduced to 12.5 mg and her hydrochlorothiazide was continued.  She was seen virtually in follow-up by Fabian Sharp PA-C on 07/24/2020.  She was doing well on her reduced atenolol at that time.  She reported 1 night of accelerated breaths but ported no recurrences.  She denied chest pain.  She was using Antivert for her dizziness.  She reported 2 falls since her last visit.  This made her frustrated.  She reported swelling in her right ankle that was at his baseline.  She was doing okay from cardiac standpoint.  She was admitted to the hospital on 05/29/2021 and discharged on 05/30/2021 with acute on chronic CHF.  She reported increased shortness of breath and cough over the last 2 days.  The night prior to coming to the emergency department she felt very short of breath.  She denied any chest discomfort.  She did note some increased swelling in her right leg.  She denied fever body aches headache.  She did indicate that she had a possible exposure to COVID via her neighbors.  She had been outside talking to them but not at home.  She is COVID vaccinated x2.  Her blood pressure was 144/75 with a pulse of 71.  She was noted to have +1  bilateral lower extremity pitting edema.  Her EKG showed atrial fibrillation 66 bpm.  She also reported that her furosemide had been decreased recently from 40 mg a day to 20 mg a day.  She was noted to have slight crackles during auscultation mild elevation in her BNP.  She was negative for COVID-19 and her chest x-ray showed no significant vascular congestion.  Her furosemide was increased back to 40 mg a day.  She presents the clinic today for follow-up evaluation states she feels well.  She reports that the night that she went to the hospital she noticed rattling type sounds as she was breathing.  She noted lower extremity swelling, cough and increased shortness of breath.  Since she has had her furosemide return to 40 mg daily she has felt much better.  We reviewed the importance of low-salt diet and fluid restriction.  She comes in today wearing a lower extremity support stocking on her right leg.  She reports that she has also noticed significant improvement in her legs due to wearing her stockings.  Her son who lives with her smokes in the house.  I encouraged her to ask him to smoke outside.  I will give her a weight log, have her increase her physical activity as tolerated, give her the salty 6 diet sheet, fluid restriction instructions, and have her follow-up in 4 to 6 months.  Today she denies chest pain, shortness of  breath, lower extremity edema, fatigue, palpitations, melena, hematuria, hemoptysis, diaphoresis, weakness, presyncope, syncope, orthopnea, and PND.   Past Medical History:  Diagnosis Date   Anxiety    Arthritis    Breast cancer ([place]) 2006   right breast   Chronic cough    Chronic UTI    Depression    Dysrhythmia    Esophageal reflux    Esophageal stricture    Family history of malignant neoplasm of gastrointestinal tract    GERD (gastroesophageal reflux disease)    Hearing loss    Hemorrhoids    internal   History of hiatal hernia    Hypercholesterolemia     Hypertension    Numbness and tingling    feeet, occasionally   Osteopenia    Personal history of colonic polyps 02/09/2007   TUBULAR ADENOMA   Personal history of radiation therapy    PONV (postoperative nausea and vomiting)    trouble opening mouth and jaw, jaw pops, trouble turning head   Rotator cuff tear    RT   Status post dilation of esophageal narrowing    Umbilical hernia     Past Surgical History:  Procedure Laterality Date   BREAST LUMPECTOMY  2006   right lumpectomy   CATARACT EXTRACTION W/ INTRAOCULAR LENS IMPLANT Bilateral    COLONOSCOPY     CYSTOCELE REPAIR N/A 04/20/2016   Procedure: AUGMENTED ANTERIOR VAULT REPAIR, COLOPLAST DERMIS GRAFT KELLY PLICATION SACROSPINOUS FIXATION;  Surgeon: Carolan Clines, MD;  Location: [facility];  Service: Urology;  Laterality: N/A;   DILATION AND CURETTAGE OF UTERUS     ESOPHAGOGASTRODUODENOSCOPY ENDOSCOPY     PUBOVAGINAL SLING N/A 04/20/2016   Procedure: PUBO-VAGINAL [name];  Surgeon: Carolan Clines, MD;  Location: [facility];  Service: Urology;  Laterality: N/A;   REVISION URINARY SLING N/A 06/12/2016   Procedure: INCISION OF URETHRAL SLING;  Surgeon: Carolan Clines, MD;  Location: [facility];  Service: Urology;  Laterality: N/A;  1 HOUR   SHOULDER OPEN ROTATOR CUFF REPAIR Right 02/28/2013   Procedure: RIGHT ROTATOR CUFF REPAIR SHOULDER OPEN WITH GRAFT AND ANCHORS;  Surgeon: Tobi Bastos, MD;  Location: [facility];  Service: Orthopedics;  Laterality: Right;   UMBILICAL HERNIA REPAIR      Current Medications: Current Meds  Medication Sig   acetaminophen (TYLENOL) 325 MG tablet Take 650 mg by mouth every 6 (six) hours as needed for moderate pain.   albuterol (PROVENTIL HFA;VENTOLIN HFA) 108 (90 Base) MCG/ACT inhaler Inhale 2 puffs into the lungs every 6 (six) hours as needed for wheezing or shortness of breath.   amitriptyline (ELAVIL) 25 MG tablet 25 mg 2 (two) times daily.    aspirin EC 81 MG tablet Take 81 mg by mouth daily.    atenolol (TENORMIN) 25 MG tablet Take 0.5 tablets (12.5 mg total) by mouth daily.   Calcium Carb-Cholecalciferol (CALCIUM 600 + D PO) Take 1 tablet by mouth daily.   fluticasone (CUTIVATE) 0.005 % ointment Apply 1 application topically daily as needed (itching).   furosemide (LASIX) 40 MG tablet Take 1 tablet (40 mg total) by mouth daily.   hydrocortisone 2.5 % ointment Apply 2.5 mg topically as needed.   meclizine (ANTIVERT) 12.5 MG tablet Take 12.5 mg by mouth 2 (two) times daily.   meloxicam (MOBIC) 7.5 MG tablet Take 7.5 mg by mouth as needed.   Multiple Vitamin (MULTIVITAMIN WITH MINERALS) TABS tablet Take 1 tablet by mouth daily.   mupirocin ointment (BACTROBAN) 2 % Apply 2 g topically  as needed.   omeprazole (PRILOSEC) 20 MG capsule Take 20 mg by mouth daily as needed (For heartburn or acid reflux.).    perphenazine (TRILAFON) 2 MG tablet Take 2 mg by mouth 2 (two) times daily.    polyethylene glycol powder (GLYCOLAX/MIRALAX) powder Take 17 g by mouth daily. As Needed   potassium chloride SA (KLOR-CON M20) 20 MEQ tablet Take 1 tablet (20 mEq total) by mouth daily.   pravastatin (PRAVACHOL) 20 MG tablet Take 20 mg by mouth at bedtime.    Current Facility-Administered Medications for the 06/10/21 encounter (Office Visit) with Deberah Pelton, NP  Medication   0.9 %  sodium chloride infusion     Allergies:   Ciprofloxacin, Clindamycin/lincomycin, Codeine, Diclofenac, Fenoprofen calcium, Flexeril [cyclobenzaprine hcl], Hydrocodone, Macrobid [nitrofurantoin], Noroxin [norfloxacin], Relafen [nabumetone], and Sulfa antibiotics   Social History   Socioeconomic History   Marital status: Widowed    Spouse name: Not on file   Number of children: 1   Years of education: Not on file   Highest education level: Not on file  Occupational History   Occupation: retired    Fish farm manager: RETIRED  Tobacco Use   Smoking status: Never   Smokeless tobacco: Never  Substance and Sexual Activity    Alcohol use: No    Alcohol/week: 0.0 standard drinks   Drug use: No   Sexual activity: Never    Partners: Male  Other Topics Concern   Not on file  Social History Narrative   Not on file   Social Determinants of Health   Financial Resource Strain: Not on file  Food Insecurity: Not on file  Transportation Needs: Not on file  Physical Activity: Not on file  Stress: Not on file  Social Connections: Not on file     Family History: The patient's family history includes Clotting disorder in her son; Colon cancer (age of onset: 41) in her mother; Emphysema in her father; Glaucoma in her mother; Heart disease in her sister; Hypertension in her sister; Kidney cancer in her mother; Lung cancer in her brother; Stomach cancer in her brother; Stroke in her sister.  ROS:   Please see the history of present illness.     All other systems reviewed and are negative.   Risk Assessment/Calculations:           Physical Exam:    VS:  BP 126/76   Pulse 86   Ht '5\' 3"'$  (1.6 m)   Wt 142 lb (64.4 kg)   LMP 11/03/1983   SpO2 93%   BMI 25.15 kg/m     Wt Readings from Last 3 Encounters:  06/10/21 142 lb (64.4 kg)  05/29/21 140 lb (63.5 kg)  05/21/21 140 lb 9.6 oz (63.8 kg)     GEN:  Well nourished, well developed in no acute distress HEENT: Normal NECK: No JVD; No carotid bruits LYMPHATICS: No lymphadenopathy CARDIAC: RRR, no murmurs, rubs, gallops RESPIRATORY:  Clear to auscultation without rales, wheezing or rhonchi  ABDOMEN: Soft, non-tender, non-distended MUSCULOSKELETAL:  No edema; No deformity  SKIN: Warm and dry NEUROLOGIC:  Alert and oriented x 3 PSYCHIATRIC:  Normal affect    EKGs/Labs/Other Studies Reviewed:    The following studies were reviewed today:  Echocardiogram 06/02/2021 IMPRESSIONS     1. Left ventricular ejection fraction, by estimation, is 55 to 60%. The  left ventricle has normal function. The left ventricle has no regional  wall motion abnormalities.  Left ventricular diastolic parameters are  indeterminate.   2.  Right ventricular systolic function is normal. The right ventricular  size is normal. There is mildly elevated pulmonary artery systolic  pressure.   3. Left atrial size was moderately dilated.   4. The mitral valve is grossly normal. Trivial mitral valve  regurgitation. No evidence of mitral stenosis.   5. The aortic valve is tricuspid. Aortic valve regurgitation is mild. No  aortic stenosis is present.   6. The inferior vena cava is normal in size with <50% respiratory  variability, suggesting right atrial pressure of 8 mmHg.   Comparison(s): A prior study was performed on 12/15/2019. No significant  change from prior study.   EKG:  EKG is not ordered today.    Recent Labs: 05/29/2021: ALT 7; B Natriuretic Peptide 283.2; BUN 22; Creatinine, Ser 1.11; Hemoglobin 11.5; Platelets 159; Potassium 3.8; Sodium 141  Recent Lipid Panel    Component Value Date/Time   CHOL 119 05/21/2021 1024   TRIG 69 05/21/2021 1024   HDL 50 05/21/2021 1024   CHOLHDL 2.4 05/21/2021 1024   LDLCALC 55 05/21/2021 1024    ASSESSMENT & PLAN    Chronic diastolic CHF-no increased DOE or activity intolerance.  Euvolemic.  Echocardiogram 06/02/2021 showed normal LVEF with intermediate diastolic parameters.  Details above.  Reviewed labs from emergency department and echocardiogram. Continue furosemide, atenolol Heart healthy low-sodium diet-salty 6 given Increase physical activity as tolerated Weight log Fluid restriction-48-64 ounces daily Lower extremity support stockings  Atrial fibrillation-heart rate today 86.  CHA2DS2-VASc score 4 (female, age, hypertension) not a candidate for anticoagulation due to dizziness and fall history. Continue atenolol, aspirin Heart healthy low-sodium diet-salty 6 given Increase physical activity as tolerated Avoid triggers caffeine, chocolate, EtOH, dehydration etc.  Hypertension-BP today 126/76.   Well-controlled at home. Continue atenolol, furosemide.   Heart healthy low-sodium diet-salty 6 given Increase physical activity as tolerated  Hyperlipidemia-05/21/2021: Cholesterol, Total 119; HDL 50; LDL Chol Calc (NIH) 55; Triglycerides 69 Continue pravastatin Heart healthy low-sodium high-fiber diet Increase physical activity as tolerated  Falls/dizziness-denies recent falls.  Dizziness well managed with meclizine. Ambulate with additional support if needed-recommended cane Continue meclizine Lower extremity support stockings  Disposition: Follow-up with Dr. Claiborne Billings or me in 4-6 months.       Medication Adjustments/Labs and Tests Ordered: Current medicines are reviewed at length with the patient today.  Concerns regarding medicines are outlined above.  No orders of the defined types were placed in this encounter.  No orders of the defined types were placed in this encounter.   There are no Patient Instructions on file for this visit.   Signed, Deberah Pelton, NP  06/10/2021 4:23 PM      Notice: This dictation was prepared with Dragon dictation along with smaller phrase technology. Any transcriptional errors that result from this process are unintentional and may not be corrected upon review.  I spent 15 minutes examining this patient, reviewing medications, and using patient centered shared decision making involving her cardiac care.  Prior to her visit I spent greater than 20 minutes reviewing her past medical history,  medications, and prior cardiac tests.

## 2021-06-10 ENCOUNTER — Ambulatory Visit (HOSPITAL_BASED_OUTPATIENT_CLINIC_OR_DEPARTMENT_OTHER): Payer: Medicare HMO | Admitting: General Practice

## 2021-06-10 ENCOUNTER — Encounter (HOSPITAL_BASED_OUTPATIENT_CLINIC_OR_DEPARTMENT_OTHER): Payer: Self-pay | Admitting: General Practice

## 2021-06-10 ENCOUNTER — Other Ambulatory Visit: Payer: Self-pay

## 2021-06-10 VITALS — BP 126/76 | HR 86 | Ht 63.0 in | Wt 142.0 lb

## 2021-06-10 DIAGNOSIS — I5031 Acute diastolic (congestive) heart failure: Secondary | ICD-10-CM | POA: Diagnosis not present

## 2021-06-10 DIAGNOSIS — R296 Repeated falls: Secondary | ICD-10-CM

## 2021-06-10 DIAGNOSIS — R42 Dizziness and giddiness: Secondary | ICD-10-CM

## 2021-06-10 DIAGNOSIS — I10 Essential (primary) hypertension: Secondary | ICD-10-CM

## 2021-06-10 DIAGNOSIS — I4891 Unspecified atrial fibrillation: Secondary | ICD-10-CM

## 2021-06-10 DIAGNOSIS — E785 Hyperlipidemia, unspecified: Secondary | ICD-10-CM

## 2021-06-10 NOTE — Patient Instructions (Addendum)
Medication Instructions:  Your physician recommends that you continue on your current medications as directed. Please refer to the Current Medication list given to you today.  *If you need a refill on your cardiac medications before your next appointment, please call your pharmacy*   Follow-Up: At [facility], you and your health needs are our priority.  As part of our continuing mission to provide you with exceptional heart care, we have created designated Provider Care Teams.  These Care Teams include your primary Cardiologist (physician) and Advanced Practice Providers (APPs -  Physician Assistants and Nurse Practitioners) who all work together to provide you with the care you need, when you need it.  We recommend signing up for the patient portal called "MyChart".  Sign up information is provided on this After Visit Summary.  MyChart is used to connect with patients for Virtual Visits (Telemedicine).  Patients are able to view lab/test results, encounter notes, upcoming appointments, etc.  Non-urgent messages can be sent to your provider as well.   To learn more about what you can do with MyChart, go to [email or web address].    Your next appointment:   4-6 month(s)  The format for your next appointment:   In Person  Provider:   You may see Shelva Majestic, MD or one of the following Advanced Practice Providers on your designated Care Team:   Almyra Deforest, PA-C Fabian Sharp, PA-C or  Roby Lofts, [place]   Other Instructions Coletta Memos, FNP has recommended limiting your fluid intake to 40-60 oz of fluid per day.   Daily Weight Record: It is important to weigh yourself daily. Keep your daily weight chart near your scale. Weigh yourself each morning at the same time. Weigh yourself without shoes, and try to wear the same amount of clothing each day. Compare today's weight to yesterday's weight.  Bring this form with you to your follow-up appointments.    Name:  Date of  Birth:             Daily Weight Log  Date Weight Date Weight

## 2021-06-25 ENCOUNTER — Other Ambulatory Visit: Payer: Self-pay

## 2021-06-25 ENCOUNTER — Ambulatory Visit: Payer: Medicare HMO | Admitting: Pulmonary Disease

## 2021-06-25 ENCOUNTER — Encounter: Payer: Self-pay | Admitting: Pulmonary Disease

## 2021-06-25 VITALS — BP 118/70 | HR 81 | Temp 98.0°F | Ht 63.0 in | Wt 140.0 lb

## 2021-06-25 DIAGNOSIS — R0600 Dyspnea, unspecified: Secondary | ICD-10-CM | POA: Diagnosis not present

## 2021-06-25 DIAGNOSIS — R0609 Other forms of dyspnea: Secondary | ICD-10-CM

## 2021-06-25 DIAGNOSIS — I5031 Acute diastolic (congestive) heart failure: Secondary | ICD-10-CM | POA: Diagnosis not present

## 2021-06-25 MED ORDER — FLUTICASONE PROPIONATE 50 MCG/ACT NA SUSP: 1.0000 | Freq: Every day | NASAL | 3 refills | Status: DC

## 2021-06-25 NOTE — Progress Notes (Signed)
$'@Patient'z$  ID: Natalie West, female    DOB: 1930/11/07, 87 y.o.   MRN: [id number]  Chief Complaint  Patient presents with   Consult    Patient reports shortness of breath at rest and exertion, patient feels she has become worse in last 6 months.     Referring provider: Warren Lacy, PA*  HPI:   87 y.o. woman whom we are seeing in consultation for outpatient of dyspnea exertion, elevated pulmonary pressures on TTE.  Most recent cardiology office visit note reviewed 06/2021.  Note from referring provider 05/2021 reviewed.  Most recent note from cardiologist Dr. Claiborne Billings 01/2020 reviewed.  Difficult to ascertain history from patient.  Seems like he has had several years at least of dyspnea on exertion.  Unclear if worsened or better over time.  He states he is tried an inhaler in the past, cannot recall what it was.  Did not think it was very helpful.  Seems been on various doses of Lasix recently.  Decrease to 20 mg daily at cardiology visit mid July 2022.  She presented to the ED about a week later with worsening lower extremity swelling dyspnea exertion.  She was diagnosed with CHF exacerbation and Lasix was increased to 40 mg daily.  She says since the increase in Lasix her dyspnea on exertion is a bit better.  She gets winded worse when bending over inclines and stairs.  Can occur on flat surface as well but not as quickly or frequently.  Better with rest.  She denies significant nasal congestion, atopic symptoms.  No time of day when things are better or worse.  No environmental or seasonal factors she can identify.  No other alleviating or exacerbating factors.  Reviewed 3 TTE's dating back to 2016 most recently this year in 2022 that showed similar findings of left atrial dilation, diastolic dysfunction, normal RA and RV size, normal RV function, aortic valve and mitral valve regurg, mildly elevated pulmonary pressures.  Most recent chest x-ray x2 05/2021 reviewed interpreted as clear lungs  bilaterally, compared to prior chest x-ray 12/2020 no changes on my review and interpretation.  PMH: Breast cancer remission, diastolic CHF Surgical history: Lumpectomy, cystocele repair, urinary sling, rotator cuff repair, hernia repair Family history: Mother colon cancer, kidney cancer, father with emphysema and was heavy smoker  social history: She is a never smoker, lives in [place] / Pulmonary Flowsheets:   ACT:  No flowsheet data found.  MMRC: No flowsheet data found.  Epworth:  No flowsheet data found.  Tests:   FENO:  No results found for: NITRICOXIDE  PFT: No flowsheet data found.  WALK:  No flowsheet data found.  Imaging: Personally reviewed as per EMR and discussion in this note DG Chest 2 View  Result Date: 05/29/2021 CLINICAL DATA:  Shortness of breath EXAM: CHEST - 2 VIEW COMPARISON:  05/12/2021 FINDINGS: Mild cardiomegaly. Aortic atherosclerosis. Chronic changes/scarring throughout the lungs. No acute confluent opacities or effusions. IMPRESSION: Chronic changes.  No active disease. Electronically Signed   By: Rolm Baptise M.D.   On: 05/29/2021 10:31   ECHOCARDIOGRAM COMPLETE  Result Date: 06/02/2021    ECHOCARDIOGRAM REPORT   Patient Name:   Natalie West    Date of Exam: 06/02/2021 Medical Rec #:  [id number]     Height:       62.0 in Accession #:    [id number]    Weight:       140.0 lb Date of Birth:  April 22, 1931     BSA:          1.643 m Patient Age:    71 years      BP:           130/78 mmHg Patient Gender: F             HR:           74 bpm. Exam Location:  [facility] Procedure: 2D Echo, Cardiac Doppler and Color Doppler Indications:    [id number] Acute diastolic (congestive) heart failure  History:        Patient has prior history of Echocardiogram examinations, most                 recent 12/15/2019. Arrythmias:Atrial Fibrillation,                 Signs/Symptoms:Shortness of Breath; Risk Factors:Hypertension                 and Dyslipidemia.  Chronic cough. Weakness. Leg swelling.  Sonographer:    Diamond Nickel RCS Referring Phys: [id number] [place]  1. Left ventricular ejection fraction, by estimation, is 55 to 60%. The left ventricle has normal function. The left ventricle has no regional wall motion abnormalities. Left ventricular diastolic parameters are indeterminate.  2. Right ventricular systolic function is normal. The right ventricular size is normal. There is mildly elevated pulmonary artery systolic pressure.  3. Left atrial size was moderately dilated.  4. The mitral valve is grossly normal. Trivial mitral valve regurgitation. No evidence of mitral stenosis.  5. The aortic valve is tricuspid. Aortic valve regurgitation is mild. No aortic stenosis is present.  6. The inferior vena cava is normal in size with <50% respiratory variability, suggesting right atrial pressure of 8 mmHg. Comparison(s): A prior study was performed on 12/15/2019. No significant change from prior study. FINDINGS  Left Ventricle: Left ventricular ejection fraction, by estimation, is 55 to 60%. The left ventricle has normal function. The left ventricle has no regional wall motion abnormalities. The left ventricular internal cavity size was small. There is no left ventricular hypertrophy of the basal-septal segment. Left ventricular diastolic parameters are indeterminate. Right Ventricle: The right ventricular size is normal. No increase in right ventricular wall thickness. Right ventricular systolic function is normal. There is mildly elevated pulmonary artery systolic pressure. The tricuspid regurgitant velocity is 2.67  m/s, and with an assumed right atrial pressure of 8 mmHg, the estimated right ventricular systolic pressure is [id number] mmHg. Left Atrium: Left atrial size was moderately dilated. Right Atrium: Right atrial size was normal in size. Pericardium: There is no evidence of pericardial effusion. Mitral Valve: The mitral valve is grossly normal.  Trivial mitral valve regurgitation. No evidence of mitral valve stenosis. Tricuspid Valve: The tricuspid valve is normal in structure. Tricuspid valve regurgitation is trivial. No evidence of tricuspid stenosis. Aortic Valve: The aortic valve is tricuspid. Aortic valve regurgitation is mild. Aortic regurgitation PHT measures 726 msec. No aortic stenosis is present. Pulmonic Valve: The pulmonic valve was normal in structure. Pulmonic valve regurgitation is not visualized. No evidence of pulmonic stenosis. Aorta: The aortic root and ascending aorta are structurally normal, with no evidence of dilitation. Venous: The inferior vena cava is normal in size with less than 50% respiratory variability, suggesting right atrial pressure of 8 mmHg. IAS/Shunts: The atrial septum is grossly normal.  LEFT VENTRICLE PLAX 2D LVIDd:         3.40 cm LVIDs:  1.90 cm LV PW:         0.90 cm LV IVS:        1.20 cm LVOT diam:     1.80 cm LV SV:         42 LV SV Index:   26 LVOT Area:     2.54 cm  RIGHT VENTRICLE RV Basal diam:  2.50 cm TAPSE (M-mode): 1.4 cm RVSP:           36.5 mmHg LEFT ATRIUM             Index       RIGHT ATRIUM           Index LA diam:        3.90 cm 2.37 cm/m  RA Pressure: 8.00 mmHg LA Vol (A2C):   58.3 ml 35.49 ml/m RA Area:     13.50 cm LA Vol (A4C):   71.1 ml 43.28 ml/m RA Volume:   31.40 ml  19.11 ml/m LA Biplane Vol: 67.0 ml 40.78 ml/m  AORTIC VALVE LVOT Vmax:   70.47 cm/s LVOT Vmean:  48.233 cm/s LVOT VTI:    0.167 m AI PHT:      726 msec  AORTA Ao Root diam: 3.50 cm TRICUSPID VALVE TR Peak grad:   28.5 mmHg TR Vmax:        267.00 cm/s Estimated RAP:  8.00 mmHg RVSP:           36.5 mmHg  SHUNTS Systemic VTI:  0.17 m Systemic Diam: 1.80 cm Rudean Haskell MD Electronically signed by Rudean Haskell MD Signature Date/Time: 06/02/2021/2:26:31 PM    Final     Lab Results: Personally reviewed, eosinophils as high as 300 in the past CBC    Component Value Date/Time   WBC 6.3 05/29/2021  1134   RBC 3.46 (L) 05/29/2021 1134   HGB 11.5 (L) 05/29/2021 1134   HGB 12.1 05/07/2021 1410   HGB 13.2 06/28/2014 1359   HCT 34.1 (L) 05/29/2021 1134   HCT 36.8 05/07/2021 1410   HCT 41.0 06/28/2014 1359   PLT 159 05/29/2021 1134   PLT 168 05/07/2021 1410   MCV 98.6 05/29/2021 1134   MCV 96 05/07/2021 1410   MCV 95.3 06/28/2014 1359   MCH 33.2 05/29/2021 1134   MCHC 33.7 05/29/2021 1134   RDW 13.5 05/29/2021 1134   RDW 12.4 05/07/2021 1410   RDW 13.0 06/28/2014 1359   LYMPHSABS 0.9 05/29/2021 1134   LYMPHSABS 1.7 05/07/2021 1410   LYMPHSABS 1.8 06/28/2014 1359   MONOABS 0.5 05/29/2021 1134   MONOABS 0.5 06/28/2014 1359   EOSABS 0.2 05/29/2021 1134   EOSABS 0.3 05/07/2021 1410   EOSABS 0.2 10/29/2009 1147   BASOSABS 0.0 05/29/2021 1134   BASOSABS 0.0 05/07/2021 1410   BASOSABS 0.0 06/28/2014 1359    BMET    Component Value Date/Time   NA 141 05/29/2021 1134   NA 142 05/21/2021 1024   NA 142 06/28/2014 1400   K 3.8 05/29/2021 1134   K 3.9 06/28/2014 1400   CL 104 05/29/2021 1134   CL 109 (H) 03/31/2013 0917   CO2 29 05/29/2021 1134   CO2 25 06/28/2014 1400   GLUCOSE 91 05/29/2021 1134   GLUCOSE 92 06/28/2014 1400   GLUCOSE 81 03/31/2013 0917   BUN 22 05/29/2021 1134   BUN 17 05/21/2021 1024   BUN 16.0 06/28/2014 1400   CREATININE 1.11 (H) 05/29/2021 1134   CREATININE 0.82 01/16/2015 1450   CREATININE 0.8  06/28/2014 1400   CALCIUM 9.1 05/29/2021 1134   CALCIUM 9.1 06/28/2014 1400   GFRNONAA 48 (L) 05/29/2021 1134   GFRAA 57 (L) 12/06/2019 1036    BNP    Component Value Date/Time   BNP 283.2 (H) 05/29/2021 1134    ProBNP No results found for: PROBNP  Specialty Problems       Pulmonary Problems   HIATAL HERNIA    Qualifier: Diagnosis of  By: Arsenio Loader RN, [place]         Allergies  Allergen Reactions   Ciprofloxacin Other (See Comments)    Reaction unknown   Clindamycin/Lincomycin Other (See Comments)    Reaction unknown    Codeine  Nausea And Vomiting   Diclofenac Other (See Comments)    Reaction unknown    Fenoprofen Calcium Other (See Comments)    Reaction unknown    Flexeril [Cyclobenzaprine Hcl] Other (See Comments)    Reaction unknown    Hydrocodone Other (See Comments)    "Last time it was too strong and messed up my mind"   Macrobid [Nitrofurantoin] Other (See Comments)    Reaction unknown    Noroxin [Norfloxacin] Other (See Comments)    Reaction unknown    Relafen [Nabumetone] Other (See Comments)    Reaction unknown    Sulfa Antibiotics Other (See Comments)    Reaction unknown     Immunization History  Administered Date(s) Administered   Fluad Quad(high Dose 65+) 07/09/2019   Influenza Split 08/12/2010   Influenza, High Dose Seasonal PF 09/11/2017, 08/04/2018   PFIZER Comirnaty(Gray Top)Covid-19 Tri-Sucrose Vaccine 03/26/2021   PFIZER(Purple Top)SARS-COV-2 Vaccination 01/06/2020, 01/27/2020, 08/15/2020   Pneumococcal Conjugate-13 01/15/2017   Pneumococcal Polysaccharide-23 08/30/2006   Tdap 05/24/2007, 02/08/2021    Past Medical History:  Diagnosis Date   Anxiety    Arthritis    Breast cancer ([place]) 2006   right breast   Chronic cough    Chronic UTI    Depression    Dysrhythmia    Esophageal reflux    Esophageal stricture    Family history of malignant neoplasm of gastrointestinal tract    GERD (gastroesophageal reflux disease)    Hearing loss    Hemorrhoids    internal   History of hiatal hernia    Hypercholesterolemia    Hypertension    Numbness and tingling    feeet, occasionally   Osteopenia    Personal history of colonic polyps 02/09/2007   TUBULAR ADENOMA   Personal history of radiation therapy    PONV (postoperative nausea and vomiting)    trouble opening mouth and jaw, jaw pops, trouble turning head   Rotator cuff tear    RT   Status post dilation of esophageal narrowing    Umbilical hernia     Tobacco History: Social History   Tobacco Use  Smoking Status  Never  Smokeless Tobacco Never   Counseling given: Not Answered   Continue to not smoke  Outpatient Encounter Medications as of 06/25/2021  Medication Sig   acetaminophen (TYLENOL) 325 MG tablet Take 650 mg by mouth every 6 (six) hours as needed for moderate pain.   albuterol (PROVENTIL HFA;VENTOLIN HFA) 108 (90 Base) MCG/ACT inhaler Inhale 2 puffs into the lungs every 6 (six) hours as needed for wheezing or shortness of breath.   amitriptyline (ELAVIL) 25 MG tablet 25 mg 2 (two) times daily.    aspirin EC 81 MG tablet Take 81 mg by mouth daily.   atenolol (TENORMIN) 25 MG tablet Take 0.5 tablets (  12.5 mg total) by mouth daily.   Calcium Carb-Cholecalciferol (CALCIUM 600 + D PO) Take 1 tablet by mouth daily.   fluticasone (CUTIVATE) 0.005 % ointment Apply 1 application topically daily as needed (itching).   fluticasone (FLONASE) 50 MCG/ACT nasal spray Place 1 spray into both nostrils daily.   furosemide (LASIX) 40 MG tablet Take 1 tablet (40 mg total) by mouth daily.   hydrocortisone 2.5 % ointment Apply 2.5 mg topically as needed.   meclizine (ANTIVERT) 12.5 MG tablet Take 12.5 mg by mouth 2 (two) times daily.   meloxicam (MOBIC) 7.5 MG tablet Take 7.5 mg by mouth as needed.   Multiple Vitamin (MULTIVITAMIN WITH MINERALS) TABS tablet Take 1 tablet by mouth daily.   mupirocin ointment (BACTROBAN) 2 % Apply 2 g topically as needed.   omeprazole (PRILOSEC) 20 MG capsule Take 20 mg by mouth daily as needed (For heartburn or acid reflux.).    perphenazine (TRILAFON) 2 MG tablet Take 2 mg by mouth 2 (two) times daily.    polyethylene glycol powder (GLYCOLAX/MIRALAX) powder Take 17 g by mouth daily. As Needed   potassium chloride SA (KLOR-CON M20) 20 MEQ tablet Take 1 tablet (20 mEq total) by mouth daily.   pravastatin (PRAVACHOL) 20 MG tablet Take 20 mg by mouth at bedtime.    [DISCONTINUED] meloxicam (MOBIC) 15 MG tablet TAKE 1 TABLET BY MOUTH EVERY DAY WITH A MEAL AS NEEDED FOR ARTHRITIS  PAIN   Facility-Administered Encounter Medications as of 06/25/2021  Medication   0.9 %  sodium chloride infusion     Review of Systems  Review of Systems  Unable to fully ascertain given inability to give history or consistently respond to questions in affirmative or negative. Physical Exam  BP 118/70 (BP Location: Left Arm, Patient Position: Sitting, Cuff Size: Normal)   Pulse 81   Temp 98 F (36.7 C) (Oral)   Ht '5\' 3"'$  (1.6 m)   Wt 140 lb (63.5 kg)   LMP 11/03/1983   SpO2 94%   BMI 24.80 kg/m   Wt Readings from Last 5 Encounters:  06/25/21 140 lb (63.5 kg)  06/10/21 142 lb (64.4 kg)  05/29/21 140 lb (63.5 kg)  05/21/21 140 lb 9.6 oz (63.8 kg)  05/07/21 141 lb (64 kg)    BMI Readings from Last 5 Encounters:  06/25/21 24.80 kg/m  06/10/21 25.15 kg/m  05/29/21 25.61 kg/m  05/21/21 25.72 kg/m  05/07/21 25.79 kg/m     Physical Exam General: Frail, sitting in exam chair Eyes: EOMI, no icterus Neck: Supple, no JVP Pulmonary: No work of breathing, early inspiratory crackles in bases that clears caudally Cardiovascular: Regular in rhythm, normal Abdomen: Nondistended, bowel sounds present MSK: No synovitis, joint effusion Neuro: Walks with his cane, sensation intact Psych: Normal mood, full affect, circumferential tangential at times  Assessment & Plan:   Dyspnea exertion: Likely multifactorial related to intermittent mild volume overload given her diastolic dysfunction, aortic and mitral valvular disease, mild pulmonary hypertension the most likely related to group 2 disease as discussed below, deconditioning, likely mild restriction with significant kyphosis on exam.  We will evaluate further with PFTs if there is any pulmonary pathology that can be improved.  Pulmonary hypertension, presumed: Mildly elevated estimated pulmonary artery pressures since 2016.  RV and RA size within normal limits and stable, RV function preserved.  She has persistent left atrial  dilation with both mitral valve and aortic valve regurgitation as well as diastolic dysfunction.  Most likely related to group  2 disease.  PFTs as above to see if there is contribution of group 3 disease.  Given stability over the last 6 years, low suspicion for bone disease.  Overall, feel she is a poor candidate for pulmonary vasodilators even inhaled given her left-sided heart disease.  Chronic cough: Unclear etiology.  Not really any significant atopic symptoms.  Denies significant atopic symptoms.  Has GERD reports taking omeprazole.  Given dry cough, high suspicion for GERD related cough.  Low suspicion for asthma.  Will trial Flonase given intermittent nasal congestion to see if this helps with cough.   Return in about 3 months (around 09/25/2021).   Lanier Clam, MD 06/25/2021

## 2021-06-25 NOTE — Patient Instructions (Addendum)
Nice to meet you  Try flonase 1 spray each nostril daily to see if helps with cough  We will get pulmonary function tests to see if it helps [place] know what is making you short of breath.   Return to clinic in 3 moths or sooner as needed

## 2021-06-30 ENCOUNTER — Other Ambulatory Visit: Payer: Self-pay

## 2021-06-30 ENCOUNTER — Ambulatory Visit (INDEPENDENT_AMBULATORY_CARE_PROVIDER_SITE_OTHER): Payer: Medicare HMO | Admitting: Pulmonary Disease

## 2021-06-30 DIAGNOSIS — R0600 Dyspnea, unspecified: Secondary | ICD-10-CM

## 2021-06-30 DIAGNOSIS — R0609 Other forms of dyspnea: Secondary | ICD-10-CM

## 2021-06-30 NOTE — Progress Notes (Signed)
Spirometry pre/post and Plethysmography performed today. Patient was unable to comprehend direction needed for DLCO x 4 attempts.

## 2021-06-30 NOTE — Patient Instructions (Signed)
Spirometry pre/post and Plethysmography performed today.

## 2021-07-02 ENCOUNTER — Encounter (INDEPENDENT_AMBULATORY_CARE_PROVIDER_SITE_OTHER): Payer: Medicare HMO | Admitting: Ophthalmology

## 2021-07-02 ENCOUNTER — Other Ambulatory Visit: Payer: Self-pay

## 2021-07-02 DIAGNOSIS — H353211 Exudative age-related macular degeneration, right eye, with active choroidal neovascularization: Secondary | ICD-10-CM

## 2021-07-02 DIAGNOSIS — H35033 Hypertensive retinopathy, bilateral: Secondary | ICD-10-CM

## 2021-07-02 DIAGNOSIS — H43813 Vitreous degeneration, bilateral: Secondary | ICD-10-CM

## 2021-07-02 DIAGNOSIS — H348322 Tributary (branch) retinal vein occlusion, left eye, stable: Secondary | ICD-10-CM

## 2021-07-02 DIAGNOSIS — I10 Essential (primary) hypertension: Secondary | ICD-10-CM | POA: Diagnosis not present

## 2021-07-04 LAB — PULMONARY FUNCTION TEST
FEF 25-75 Post: 1.81 L/sec
FEF 25-75 Pre: 1.1 L/sec
FEF2575-%Change-Post: 64 %
FEF2575-%Pred-Post: 183 %
FEF2575-%Pred-Pre: 111 %
FEV1-%Change-Post: 6 %
FEV1-%Pred-Post: 66 %
FEV1-%Pred-Pre: 62 %
FEV1-Post: 1.05 L
FEV1-Pre: 0.99 L
FEV1FVC-%Change-Post: 16 %
FEV1FVC-%Pred-Pre: 118 %
FEV6-%Change-Post: -8 %
FEV6-%Pred-Post: 51 %
FEV6-%Pred-Pre: 56 %
FEV6-Post: 1.05 L
FEV6-Pre: 1.15 L
FEV6FVC-%Pred-Post: 107 %
FEV6FVC-%Pred-Pre: 107 %
FVC-%Change-Post: -8 %
FVC-%Pred-Post: 48 %
FVC-%Pred-Pre: 53 %
FVC-Post: 1.05 L
FVC-Pre: 1.15 L
Post FEV1/FVC ratio: 100 %
Post FEV6/FVC ratio: 100 %
Pre FEV1/FVC ratio: 86 %
Pre FEV6/FVC Ratio: 100 %
RV % pred: 129 %
RV: 3.21 L
TLC % pred: 89 %
TLC: 4.38 L

## 2021-08-13 ENCOUNTER — Other Ambulatory Visit: Payer: Self-pay

## 2021-08-13 ENCOUNTER — Encounter (INDEPENDENT_AMBULATORY_CARE_PROVIDER_SITE_OTHER): Payer: Medicare HMO | Admitting: Ophthalmology

## 2021-08-13 DIAGNOSIS — H43813 Vitreous degeneration, bilateral: Secondary | ICD-10-CM

## 2021-08-13 DIAGNOSIS — H353211 Exudative age-related macular degeneration, right eye, with active choroidal neovascularization: Secondary | ICD-10-CM

## 2021-08-13 DIAGNOSIS — I10 Essential (primary) hypertension: Secondary | ICD-10-CM | POA: Diagnosis not present

## 2021-08-13 DIAGNOSIS — H34832 Tributary (branch) retinal vein occlusion, left eye, with macular edema: Secondary | ICD-10-CM

## 2021-08-13 DIAGNOSIS — H35033 Hypertensive retinopathy, bilateral: Secondary | ICD-10-CM | POA: Diagnosis not present

## 2021-08-20 ENCOUNTER — Telehealth: Payer: Self-pay

## 2021-08-20 NOTE — Progress Notes (Signed)
Cardiology Office Note:    Date:  08/21/2021   ID:  Natalie West, DOB 06/28/1931, MRN [id number]  PCP:  Christain Sacramento, MD [place] Cardiologist: Shelva Majestic, MD   Reason for visit: 3 month follow-up  History of Present Illness:    Natalie West is a 87 y.o. female with a hx of paroxysmal atrial fibrillation, falls, episodes of dizziness, HTN, HLD, GERD, anxiety, and hx of breast cancer. In review of her falls, Dr. Claiborne Billings and the patient decided to forgo anticoagulation and initiated ASA alone.   She saw her PCP office on 04/07/2021 and was noted to be short of breath, leg swelling and orthopnea.  BNP 245, K 4.3, Cr 1.1.  She was started on lasix 20mg  daily and referred to our office.  I last saw her on 05/07/21 when she noted significant shortness of breath when walking a short distance.  At this visit, with rales in the left lung, we increased her Lasix to 40 mg daily.  Cr 7/6 was 1.3.  With no improvement in her shortness of breath and creatinine bump, we had decreased her Lasix to 20 mg daily.  With this she felt worse and eventually her Lasix was increased back to 40 mg a day.  She saw Coletta Memos on June 10, 2021 and was doing well on Lasix 40 mg daily.  Today, she states that her cough is better.  Her shortness of breath has improved since I saw her last.  She denies PND.  She mentions sometimes she hears a groaning sound when she lays back.  The pulmonologist started her on Flonase for postnasal drip.  She feels that her stomach is slightly big.  She has rare palpitations.  She denies chest pain, lightheadedness and syncope.  Her weight is down 2-4 pounds since August.  She wears compression stocking on the right leg.     Past Medical History:  Diagnosis Date   Anxiety    Arthritis    Breast cancer ([place]) 2006   right breast   Chronic cough    Chronic UTI    Depression    Dysrhythmia    Esophageal reflux    Esophageal stricture    Family history of malignant neoplasm of  gastrointestinal tract    GERD (gastroesophageal reflux disease)    Hearing loss    Hemorrhoids    internal   History of hiatal hernia    Hypercholesterolemia    Hypertension    Numbness and tingling    feeet, occasionally   Osteopenia    Personal history of colonic polyps 02/09/2007   TUBULAR ADENOMA   Personal history of radiation therapy    PONV (postoperative nausea and vomiting)    trouble opening mouth and jaw, jaw pops, trouble turning head   Rotator cuff tear    RT   Status post dilation of esophageal narrowing    Umbilical hernia     Past Surgical History:  Procedure Laterality Date   BREAST LUMPECTOMY  2006   right lumpectomy   CATARACT EXTRACTION W/ INTRAOCULAR LENS IMPLANT Bilateral    COLONOSCOPY     CYSTOCELE REPAIR N/A 04/20/2016   Procedure: AUGMENTED ANTERIOR VAULT REPAIR, COLOPLAST DERMIS GRAFT KELLY PLICATION SACROSPINOUS FIXATION;  Surgeon: Carolan Clines, MD;  Location: [facility];  Service: Urology;  Laterality: N/A;   DILATION AND CURETTAGE OF UTERUS     ESOPHAGOGASTRODUODENOSCOPY ENDOSCOPY     PUBOVAGINAL SLING N/A 04/20/2016   Procedure: PUBO-VAGINAL ALTIS SLING;  Surgeon: Carolan Clines, MD;  Location: [facility];  Service: Urology;  Laterality: N/A;   REVISION URINARY SLING N/A 06/12/2016   Procedure: INCISION OF URETHRAL SLING;  Surgeon: Carolan Clines, MD;  Location: [facility];  Service: Urology;  Laterality: N/A;  1 HOUR   SHOULDER OPEN ROTATOR CUFF REPAIR Right 02/28/2013   Procedure: RIGHT ROTATOR CUFF REPAIR SHOULDER OPEN WITH GRAFT AND ANCHORS;  Surgeon: Tobi Bastos, MD;  Location: [facility];  Service: Orthopedics;  Laterality: Right;   UMBILICAL HERNIA REPAIR      Current Medications: Current Meds  Medication Sig   acetaminophen (TYLENOL) 325 MG tablet Take 650 mg by mouth every 6 (six) hours as needed for moderate pain.   albuterol (PROVENTIL HFA;VENTOLIN HFA) 108 (90 Base) MCG/ACT inhaler Inhale 2 puffs into the lungs every 6 (six) hours  as needed for wheezing or shortness of breath.   amitriptyline (ELAVIL) 25 MG tablet 25 mg 2 (two) times daily.    aspirin EC 81 MG tablet Take 81 mg by mouth daily.   atenolol (TENORMIN) 25 MG tablet Take 0.5 tablets (12.5 mg total) by mouth daily.   Calcium Carb-Cholecalciferol (CALCIUM 600 + D PO) Take 1 tablet by mouth daily.   fluticasone (CUTIVATE) 0.005 % ointment Apply 1 application topically daily as needed (itching).   fluticasone (FLONASE) 50 MCG/ACT nasal spray Place 1 spray into both nostrils daily.   hydrocortisone 2.5 % ointment Apply 2.5 mg topically as needed.   meloxicam (MOBIC) 7.5 MG tablet Take 7.5 mg by mouth as needed.   Multiple Vitamin (MULTIVITAMIN WITH MINERALS) TABS tablet Take 1 tablet by mouth daily.   mupirocin ointment (BACTROBAN) 2 % Apply 2 g topically as needed.   pantoprazole (PROTONIX) 40 MG tablet Take 40 mg by mouth daily. 1 Tablet Daily for Heartburn and Acid Reflux   perphenazine (TRILAFON) 2 MG tablet Take 2 mg by mouth 2 (two) times daily.    polyethylene glycol powder (GLYCOLAX/MIRALAX) powder Take 17 g by mouth daily. As Needed   pravastatin (PRAVACHOL) 20 MG tablet Take 20 mg by mouth at bedtime.    Current Facility-Administered Medications for the 08/21/21 encounter (Office Visit) with Warren Lacy, PA-C  Medication   0.9 %  sodium chloride infusion     Allergies:   Ciprofloxacin, Clindamycin/lincomycin, Codeine, Diclofenac, Fenoprofen calcium, Flexeril [cyclobenzaprine hcl], Hydrocodone, Macrobid [nitrofurantoin], Noroxin [norfloxacin], Relafen [nabumetone], and Sulfa antibiotics   Social History   Socioeconomic History   Marital status: Widowed    Spouse name: Not on file   Number of children: 1   Years of education: Not on file   Highest education level: Not on file  Occupational History   Occupation: retired    Fish farm manager: RETIRED  Tobacco Use   Smoking status: Never   Smokeless tobacco: Never  Substance and Sexual Activity    Alcohol use: No    Alcohol/week: 0.0 standard drinks   Drug use: No   Sexual activity: Never    Partners: Male  Other Topics Concern   Not on file  Social History Narrative   Not on file   Social Determinants of Health   Financial Resource Strain: Not on file  Food Insecurity: Not on file  Transportation Needs: Not on file  Physical Activity: Not on file  Stress: Not on file  Social Connections: Not on file     Family History: The patient's family history includes Clotting disorder in her son; Colon cancer (age of onset: 63) in her  mother; Emphysema in her father; Glaucoma in her mother; Heart disease in her sister; Hypertension in her sister; Kidney cancer in her mother; Lung cancer in her brother; Stomach cancer in her brother; Stroke in her sister.  ROS:   Please see the history of present illness.     EKGs/Labs/Other Studies Reviewed:    Recent Labs: 05/29/2021: ALT 7; B Natriuretic Peptide 283.2; BUN 22; Creatinine, Ser 1.11; Hemoglobin 11.5; Platelets 159; Potassium 3.8; Sodium 141   Recent Lipid Panel Lab Results  Component Value Date/Time   CHOL 119 05/21/2021 10:24 AM   TRIG 69 05/21/2021 10:24 AM   HDL 50 05/21/2021 10:24 AM   LDLCALC 55 05/21/2021 10:24 AM    Physical Exam:    VS:  BP (!) 145/87   Pulse 76   Ht 5\' 3"  (1.6 m)   Wt 138 lb (62.6 kg)   LMP 11/03/1983   SpO2 95%   BMI 24.45 kg/m    No data found.  Wt Readings from Last 3 Encounters:  08/21/21 138 lb (62.6 kg)  06/25/21 140 lb (63.5 kg)  06/10/21 142 lb (64.4 kg)     GEN:  Elderly in no acute distress HEENT: Normal NECK: No JVD; No carotid bruits CARDIAC: Irreg irreg, no murmurs, rubs, gallops RESPIRATORY:  Clear to auscultation without rales, wheezing or rhonchi  ABDOMEN: Soft, non-tender, non-distended MUSCULOSKELETAL: No edema; No deformity  SKIN: Warm and dry NEUROLOGIC:  Alert and oriented PSYCHIATRIC:  Normal affect   ASSESSMENT AND PLAN    1. Chronic diastolic CHF,  euvolemic -Echo 06/2021: 55 to 60%, normal right ventricular function, moderately dilated left atrium, mild aortic regurgitation -Looks much better than when I saw her in July. -Continue Lasix 40 mg daily. -Check BMET today. -Recommend she calls [place] if shortness of breath or leg swelling worsens.  Call us if weight gets above 140 pounds.   2. Longstanding persistent atrial fibrillation ([place]), rate controlled - Pt is not on anticoagulation given hx of dizziness and falls.  Continue ASA.   - Beta-blocker for rate control.   3. Hyperlipidemia, unspecified hyperlipidemia type - Continue pravastatin.     4. Essential hypertension, BP elevated today -BP elevated today though has been normal the last 2 prior visits.  Will monitor.   5. Weakness/history of falls -She walks with a cane.  She is careful around the house.  She declines physical therapy evaluation.     Disposition: Follow-up in January with Dr. Claiborne Billings unless worsening heart failure symptoms in the meantime.        Medication Adjustments/Labs and Tests Ordered: Current medicines are reviewed at length with the patient today.  Concerns regarding medicines are outlined above.  Orders Placed This Encounter  Procedures   Basic metabolic panel   No orders of the defined types were placed in this encounter.   Patient Instructions  Medication Instructions:  No Changes *If you need a refill on your cardiac medications before your next appointment, please call your pharmacy*   Lab Work: BMET : Today If you have labs (blood work) drawn today and your tests are completely normal, you will receive your results only by: [place] (if you have MyChart) OR A paper copy in the mail If you have any lab test that is abnormal or we need to change your treatment, we will call you to review the results.   Testing/Procedures: No Testing   Follow-Up: At [facility], you and your health needs are our priority.  As  part of our  continuing mission to provide you with exceptional heart care, we have created designated Provider Care Teams.  These Care Teams include your primary Cardiologist (physician) and Advanced Practice Providers (APPs -  Physician Assistants and Nurse Practitioners) who all work together to provide you with the care you need, when you need it.  We recommend signing up for the patient portal called "MyChart".  Sign up information is provided on this After Visit Summary.  MyChart is used to connect with patients for Virtual Visits (Telemedicine).  Patients are able to view lab/test results, encounter notes, upcoming appointments, etc.  Non-urgent messages can be sent to your provider as well.   To learn more about what you can do with MyChart, go to [email or web address].    Your next appointment:   November 06, 2021 10 AM  The format for your next appointment:   In Person  Provider:   Shelva Majestic, MD   Other Instructions Please Call Office for increased Shortness of Breath, Swelling and Weight above 140 lbs.   Signed, Warren Lacy, PA-C  08/21/2021 12:00 PM    [place]

## 2021-08-20 NOTE — Telephone Encounter (Addendum)
Attempted to call patient no answer, unable to leave voice message. ----- Message from Warren Lacy, PA-C sent at 08/20/2021  1:16 PM EDT ----- Regarding: Pt can cancel appt tomorrow if she feels well I saw patient July and recommend 63-month follow-up.  She then saw Coletta Memos in August.  She was doing well so he recommended follow-up in 4 to 6 months with himself or Dr. Claiborne Billings.  If she feels well today, she does not need to come in tomorrow.  She has a follow-up scheduled with Dr. Claiborne Billings in January.  If she wants to be seen, I am happy to see her tomorrow. Anderson Malta

## 2021-08-21 ENCOUNTER — Encounter: Payer: Self-pay | Admitting: Physician Assistant

## 2021-08-21 ENCOUNTER — Other Ambulatory Visit: Payer: Self-pay

## 2021-08-21 ENCOUNTER — Ambulatory Visit: Payer: Medicare HMO | Admitting: Physician Assistant

## 2021-08-21 VITALS — BP 145/87 | HR 76 | Ht 63.0 in | Wt 138.0 lb

## 2021-08-21 DIAGNOSIS — I10 Essential (primary) hypertension: Secondary | ICD-10-CM | POA: Diagnosis not present

## 2021-08-21 DIAGNOSIS — I5032 Chronic diastolic (congestive) heart failure: Secondary | ICD-10-CM | POA: Diagnosis not present

## 2021-08-21 DIAGNOSIS — I4891 Unspecified atrial fibrillation: Secondary | ICD-10-CM

## 2021-08-21 DIAGNOSIS — I5031 Acute diastolic (congestive) heart failure: Secondary | ICD-10-CM

## 2021-08-21 DIAGNOSIS — E785 Hyperlipidemia, unspecified: Secondary | ICD-10-CM | POA: Diagnosis not present

## 2021-08-21 NOTE — Patient Instructions (Signed)
Medication Instructions:  No Changes *If you need a refill on your cardiac medications before your next appointment, please call your pharmacy*   Lab Work: BMET : Today If you have labs (blood work) drawn today and your tests are completely normal, you will receive your results only by: [place] (if you have MyChart) OR A paper copy in the mail If you have any lab test that is abnormal or we need to change your treatment, we will call you to review the results.   Testing/Procedures: No Testing   Follow-Up: At [facility], you and your health needs are our priority.  As part of our continuing mission to provide you with exceptional heart care, we have created designated Provider Care Teams.  These Care Teams include your primary Cardiologist (physician) and Advanced Practice Providers (APPs -  Physician Assistants and Nurse Practitioners) who all work together to provide you with the care you need, when you need it.  We recommend signing up for the patient portal called "MyChart".  Sign up information is provided on this After Visit Summary.  MyChart is used to connect with patients for Virtual Visits (Telemedicine).  Patients are able to view lab/test results, encounter notes, upcoming appointments, etc.  Non-urgent messages can be sent to your provider as well.   To learn more about what you can do with MyChart, go to [email or web address].    Your next appointment:   November 06, 2021 10 AM  The format for your next appointment:   In Person  Provider:   Shelva Majestic, MD   Other Instructions Please Call Office for increased Shortness of Breath, Swelling and Weight above 140 lbs.

## 2021-08-22 LAB — BASIC METABOLIC PANEL
BUN/Creatinine Ratio: 16 (ref 12–28)
BUN: 20 mg/dL (ref 8–27)
CO2: 21 mmol/L (ref 20–29)
Calcium: 8.9 mg/dL (ref 8.7–10.3)
Chloride: 104 mmol/L (ref 96–106)
Creatinine, Ser: 1.26 mg/dL — ABNORMAL HIGH (ref 0.57–1.00)
Glucose: 79 mg/dL (ref 70–99)
Sodium: 142 mmol/L (ref 134–144)
eGFR: 41 mL/min/{1.73_m2} — ABNORMAL LOW (ref >59)

## 2021-08-27 ENCOUNTER — Telehealth: Payer: Self-pay

## 2021-08-27 NOTE — Telephone Encounter (Addendum)
Called patient regarding results.----- Message from Warren Lacy, PA-C sent at 08/26/2021  3:35 PM EDT ----- Labs look stable.  It looks like your kidney function is stabilizing on this diuretic.  This is good.

## 2021-09-10 ENCOUNTER — Telehealth: Payer: Self-pay | Admitting: Physician Assistant

## 2021-09-10 NOTE — Telephone Encounter (Signed)
Patient would like to know what Natalie West heard from Dr. Silas West from pulmonary.   She will be gone the rest of the rest of the afternoon.

## 2021-09-17 ENCOUNTER — Encounter (INDEPENDENT_AMBULATORY_CARE_PROVIDER_SITE_OTHER): Payer: Medicare HMO | Admitting: Ophthalmology

## 2021-09-19 ENCOUNTER — Other Ambulatory Visit: Payer: Self-pay

## 2021-09-19 ENCOUNTER — Encounter (INDEPENDENT_AMBULATORY_CARE_PROVIDER_SITE_OTHER): Payer: Medicare HMO | Admitting: Ophthalmology

## 2021-09-19 DIAGNOSIS — I10 Essential (primary) hypertension: Secondary | ICD-10-CM

## 2021-09-19 DIAGNOSIS — H348322 Tributary (branch) retinal vein occlusion, left eye, stable: Secondary | ICD-10-CM | POA: Diagnosis not present

## 2021-09-19 DIAGNOSIS — H35033 Hypertensive retinopathy, bilateral: Secondary | ICD-10-CM

## 2021-09-19 DIAGNOSIS — H353211 Exudative age-related macular degeneration, right eye, with active choroidal neovascularization: Secondary | ICD-10-CM | POA: Diagnosis not present

## 2021-09-19 DIAGNOSIS — H43813 Vitreous degeneration, bilateral: Secondary | ICD-10-CM

## 2021-10-09 ENCOUNTER — Encounter: Payer: Self-pay | Admitting: Pulmonary Disease

## 2021-10-09 ENCOUNTER — Other Ambulatory Visit: Payer: Self-pay

## 2021-10-09 ENCOUNTER — Ambulatory Visit: Payer: Medicare HMO | Admitting: Pulmonary Disease

## 2021-10-09 VITALS — BP 118/64 | HR 82 | Temp 98.0°F | Ht 63.0 in | Wt 138.0 lb

## 2021-10-09 DIAGNOSIS — I5031 Acute diastolic (congestive) heart failure: Secondary | ICD-10-CM | POA: Diagnosis not present

## 2021-10-09 DIAGNOSIS — R053 Chronic cough: Secondary | ICD-10-CM

## 2021-10-09 NOTE — Progress Notes (Signed)
@Patient  ID: Natalie West, female    DOB: February 09, 1931, 87 y.o.   MRN: [id number]  Chief Complaint  Patient presents with   Follow-up    Pt states that her breathing is getting better and she is not so short of breath     Referring provider: Christain Sacramento, MD  HPI:   87 y.o. woman whom we are seeing in follow up for chronic cough, elevated pulmonary pressures on TTE.  Most recent cardiology office visit note reviewed.  Overall doing okay.  Feels like breathing has very improved since being on higher dose of Lasix, 40 mg daily.  She is on this for many months, prednisone 20 mg.  She continues to endorse chronic cough, throat congestion.  Was placed on Flonase at last visit once daily.  Cough is improved mildly.  Still feels like there is some nasal congestion.  Reviewed results of PFTs which shows gas trapping without fixed obstruction, normal DLCO, likely sign of longstanding relatively asymptomatic asthma.  Possible cough is result of asthma, cough variant.  HPI at initial visit: Difficult to ascertain history from patient.  87 years at least of dyspnea several years at least of dyspnea on exertion.  Unclear if worsened or better over time.  He states he is tried an inhaler in the past, cannot recall what it was.  Did not think it was very helpful.  Seems been on various doses of Lasix recently.  Decrease to 20 mg daily at cardiology visit mid July 2022.  She presented to the ED about a week later with worsening lower extremity swelling dyspnea exertion.  She was diagnosed with CHF exacerbation and Lasix was increased to 40 mg daily.  She says since the increase in Lasix her dyspnea on exertion is a bit better.  She gets winded worse when bending over inclines and stairs.  Can occur on flat surface as well but not as quickly or frequently.  Better with rest.  She denies significant nasal congestion, atopic symptoms.  No time of day when things are better or worse.  No environmental or seasonal factors she  can identify.  No other alleviating or exacerbating factors.  Reviewed 3 TTE's dating back to 2016 most recently this year in 2022 that showed similar findings of left atrial dilation, diastolic dysfunction, normal RA and RV size, normal RV function, aortic valve and mitral valve regurg, mildly elevated pulmonary pressures.  Most recent chest x-ray x2 05/2021 reviewed interpreted as clear lungs bilaterally, compared to prior chest x-ray 12/2020 no changes on my review and interpretation.  PMH: Breast cancer remission, diastolic CHF Surgical history: Lumpectomy, cystocele repair, urinary sling, rotator cuff repair, hernia repair Family history: Mother colon cancer, kidney cancer, father with emphysema and was heavy smoker  social history: She is a never smoker, lives in [place] / Pulmonary Flowsheets:   ACT:  No flowsheet data found.  MMRC: No flowsheet data found.  Epworth:  No flowsheet data found.  Tests:   FENO:  No results found for: NITRICOXIDE  PFT: PFT Results Latest Ref Rng & Units 06/30/2021  FVC-Pre L 1.15  FVC-Predicted Pre % 53  FVC-Post L 1.05  FVC-Predicted Post % 48  Pre FEV1/FVC % % 86  Post FEV1/FCV % % 100  FEV1-Pre L 0.99  FEV1-Predicted Pre % 62  FEV1-Post L 1.05  TLC L 4.38  TLC % Predicted % 89  RV % Predicted % 129  Personally viewed interpreted as normal spirometry,  no significant bronchodilator response, gas trapping with elevated RV, DLCO within normal limits, consistent with small airways disease, asthma.  WALK:  No flowsheet data found.  Imaging: Personally reviewed as per EMR and discussion in this note No results found.  Lab Results: Personally reviewed, eosinophils as high as 300 in the past CBC    Component Value Date/Time   WBC 6.3 05/29/2021 1134   RBC 3.46 (L) 05/29/2021 1134   HGB 11.5 (L) 05/29/2021 1134   HGB 12.1 05/07/2021 1410   HGB 13.2 06/28/2014 1359   HCT 34.1 (L) 05/29/2021 1134   HCT 36.8  05/07/2021 1410   HCT 41.0 06/28/2014 1359   PLT 159 05/29/2021 1134   PLT 168 05/07/2021 1410   MCV 98.6 05/29/2021 1134   MCV 96 05/07/2021 1410   MCV 95.3 06/28/2014 1359   MCH 33.2 05/29/2021 1134   MCHC 33.7 05/29/2021 1134   RDW 13.5 05/29/2021 1134   RDW 12.4 05/07/2021 1410   RDW 13.0 06/28/2014 1359   LYMPHSABS 0.9 05/29/2021 1134   LYMPHSABS 1.7 05/07/2021 1410   LYMPHSABS 1.8 06/28/2014 1359   MONOABS 0.5 05/29/2021 1134   MONOABS 0.5 06/28/2014 1359   EOSABS 0.2 05/29/2021 1134   EOSABS 0.3 05/07/2021 1410   EOSABS 0.2 10/29/2009 1147   BASOSABS 0.0 05/29/2021 1134   BASOSABS 0.0 05/07/2021 1410   BASOSABS 0.0 06/28/2014 1359    BMET    Component Value Date/Time   NA 142 08/21/2021 1207   NA 142 06/28/2014 1400   K CANCELED 08/21/2021 1207   K 3.9 06/28/2014 1400   CL 104 08/21/2021 1207   CL 109 (H) 03/31/2013 0917   CO2 21 08/21/2021 1207   CO2 25 06/28/2014 1400   GLUCOSE 79 08/21/2021 1207   GLUCOSE 91 05/29/2021 1134   GLUCOSE 92 06/28/2014 1400   GLUCOSE 81 03/31/2013 0917   BUN 20 08/21/2021 1207   BUN 16.0 06/28/2014 1400   CREATININE 1.26 (H) 08/21/2021 1207   CREATININE 0.82 01/16/2015 1450   CREATININE 0.8 06/28/2014 1400   CALCIUM 8.9 08/21/2021 1207   CALCIUM 9.1 06/28/2014 1400   GFRNONAA 48 (L) 05/29/2021 1134   GFRAA 57 (L) 12/06/2019 1036    BNP    Component Value Date/Time   BNP 283.2 (H) 05/29/2021 1134    ProBNP No results found for: PROBNP  Specialty Problems       Pulmonary Problems   HIATAL HERNIA    Qualifier: Diagnosis of  By: Arsenio Loader RN, Carissa         Allergies  Allergen Reactions   Ciprofloxacin Other (See Comments)    Reaction unknown   Clindamycin/Lincomycin Other (See Comments)    Reaction unknown    Codeine Nausea And Vomiting   Diclofenac Other (See Comments)    Reaction unknown    Fenoprofen Calcium Other (See Comments)    Reaction unknown    Flexeril [Cyclobenzaprine Hcl] Other (See  Comments)    Reaction unknown    Hydrocodone Other (See Comments)    "Last time it was too strong and messed up my mind"   Macrobid [Nitrofurantoin] Other (See Comments)    Reaction unknown    Noroxin [Norfloxacin] Other (See Comments)    Reaction unknown    Relafen [Nabumetone] Other (See Comments)    Reaction unknown    Sulfa Antibiotics Other (See Comments)    Reaction unknown     Immunization History  Administered Date(s) Administered   Fluad Quad(high Dose 65+) 07/09/2019  Influenza Split 08/12/2010   Influenza, High Dose Seasonal PF 09/11/2017, 08/04/2018   PFIZER Comirnaty(Gray Top)Covid-19 Tri-Sucrose Vaccine 03/26/2021   PFIZER(Purple Top)SARS-COV-2 Vaccination 01/06/2020, 01/27/2020, 08/15/2020   Pfizer Covid-19 Vaccine Bivalent Booster 73yrs & up 07/31/2021   Pneumococcal Conjugate-13 01/15/2017   Pneumococcal Polysaccharide-23 08/30/2006   Tdap 05/24/2007, 02/08/2021    Past Medical History:  Diagnosis Date   Anxiety    Arthritis    Breast cancer ([place]) 2006   right breast   Chronic cough    Chronic UTI    Depression    Dysrhythmia    Esophageal reflux    Esophageal stricture    Family history of malignant neoplasm of gastrointestinal tract    GERD (gastroesophageal reflux disease)    Hearing loss    Hemorrhoids    internal   History of hiatal hernia    Hypercholesterolemia    Hypertension    Numbness and tingling    feeet, occasionally   Osteopenia    Personal history of colonic polyps 02/09/2007   TUBULAR ADENOMA   Personal history of radiation therapy    PONV (postoperative nausea and vomiting)    trouble opening mouth and jaw, jaw pops, trouble turning head   Rotator cuff tear    RT   Status post dilation of esophageal narrowing    Umbilical hernia     Tobacco History: Social History   Tobacco Use  Smoking Status Never  Smokeless Tobacco Never   Counseling given: Not Answered   Continue to not smoke  Outpatient Encounter  Medications as of 10/09/2021  Medication Sig   acetaminophen (TYLENOL) 325 MG tablet Take 650 mg by mouth every 6 (six) hours as needed for moderate pain.   albuterol (PROVENTIL HFA;VENTOLIN HFA) 108 (90 Base) MCG/ACT inhaler Inhale 2 puffs into the lungs every 6 (six) hours as needed for wheezing or shortness of breath.   amitriptyline (ELAVIL) 25 MG tablet 25 mg 2 (two) times daily.    aspirin EC 81 MG tablet Take 81 mg by mouth daily.   atenolol (TENORMIN) 25 MG tablet Take 0.5 tablets (12.5 mg total) by mouth daily.   Calcium Carb-Cholecalciferol (CALCIUM 600 + D PO) Take 1 tablet by mouth daily.   fluticasone (CUTIVATE) 0.005 % ointment Apply 1 application topically daily as needed (itching).   fluticasone (FLONASE) 50 MCG/ACT nasal spray Place 1 spray into both nostrils daily.   hydrocortisone 2.5 % ointment Apply 2.5 mg topically as needed.   meloxicam (MOBIC) 7.5 MG tablet Take 7.5 mg by mouth as needed.   Multiple Vitamin (MULTIVITAMIN WITH MINERALS) TABS tablet Take 1 tablet by mouth daily.   mupirocin ointment (BACTROBAN) 2 % Apply 2 g topically as needed.   pantoprazole (PROTONIX) 40 MG tablet Take 40 mg by mouth daily. 1 Tablet Daily for Heartburn and Acid Reflux   perphenazine (TRILAFON) 2 MG tablet Take 2 mg by mouth 2 (two) times daily.    polyethylene glycol powder (GLYCOLAX/MIRALAX) powder Take 17 g by mouth daily. As Needed   pravastatin (PRAVACHOL) 20 MG tablet Take 20 mg by mouth at bedtime.    furosemide (LASIX) 40 MG tablet Take 1 tablet (40 mg total) by mouth daily.   potassium chloride SA (KLOR-CON M20) 20 MEQ tablet Take 1 tablet (20 mEq total) by mouth daily.   Facility-Administered Encounter Medications as of 10/09/2021  Medication   0.9 %  sodium chloride infusion     Review of Systems  Review of Systems  N/a Physical  Exam  BP 118/64 (BP Location: Left Arm, Patient Position: Sitting, Cuff Size: Normal)   Pulse 82   Temp 98 F (36.7 C) (Oral)   Ht 5\' 3"   (1.6 m)   Wt 138 lb (62.6 kg)   LMP 11/03/1983   SpO2 96%   BMI 24.45 kg/m   Wt Readings from Last 5 Encounters:  10/09/21 138 lb (62.6 kg)  08/21/21 138 lb (62.6 kg)  06/25/21 140 lb (63.5 kg)  06/10/21 142 lb (64.4 kg)  05/29/21 140 lb (63.5 kg)    BMI Readings from Last 5 Encounters:  10/09/21 24.45 kg/m  08/21/21 24.45 kg/m  06/25/21 24.80 kg/m  06/10/21 25.15 kg/m  05/29/21 25.61 kg/m     Physical Exam General: Frail, sitting in exam chair Eyes: EOMI, no icterus Neck: Supple, no JVP Pulmonary: Normal work of breathing, early inspiratory crackles in bases that clears cranially Cardiovascular: Regular in rhythm, normal Abdomen: Nondistended, bowel sounds present MSK: No synovitis, joint effusion Neuro: Walks with his cane, sensation intact Psych: Normal mood, full affect  Assessment & Plan:   Dyspnea exertion: Likely multifactorial related to intermittent mild volume overload given her diastolic dysfunction, aortic and mitral valvular disease, mild pulmonary hypertension the most likely related to group 2 disease as discussed below, deconditioning.  Gas trapping present on PFTs also could be contribute.  Overall much improved with increased dose and Lasix mid the early 2022.    Pulmonary hypertension, presumed: Mildly elevated estimated pulmonary artery pressures since 2016.  RV and RA size within normal limits and stable, RV function preserved.  She has persistent left atrial dilation with both mitral valve and aortic valve regurgitation as well as diastolic dysfunction.  Most likely related to group 2 disease.  PFTs as above to see if there is contribution of group 3 disease.  Given stability over the last 6 years, low suspicion for bone disease.  Overall, feel she is a poor candidate for pulmonary vasodilators even inhaled given her left-sided heart disease.  Chronic cough: Unclear etiology.  Not really any significant atopic symptoms.  Denies significant atopic  symptoms.  Has GERD reports taking omeprazole.  Mild improvement with daily Flonase.  Encouraged to increase to Flonase twice daily.  PFTs with gas trapping, possible cough variant asthma.  Consider ICS/LABA therapy in the future if cough persists or fails to improve with increased nasal spray regimen.   Return in about 3 months (around 01/07/2022).   Lanier Clam, MD 10/09/2021

## 2021-10-09 NOTE — Patient Instructions (Signed)
Nice to see you again  Use the nasal spray, Flonase 1 spray each nostril twice a day.  This is increased from once a day.  Your pulmonary function tests show subtle signs of likely asthma.  If the cough persist I would recommend using a different inhaler in the future.  Return to clinic in 3 months or sooner if needed with Dr. Silas Flood

## 2021-10-23 ENCOUNTER — Ambulatory Visit: Payer: Medicare HMO | Admitting: Cardiovascular Disease

## 2021-10-24 ENCOUNTER — Encounter (INDEPENDENT_AMBULATORY_CARE_PROVIDER_SITE_OTHER): Payer: Medicare HMO | Admitting: Ophthalmology

## 2021-10-24 ENCOUNTER — Other Ambulatory Visit: Payer: Self-pay

## 2021-10-24 DIAGNOSIS — H35033 Hypertensive retinopathy, bilateral: Secondary | ICD-10-CM | POA: Diagnosis not present

## 2021-10-24 DIAGNOSIS — H348322 Tributary (branch) retinal vein occlusion, left eye, stable: Secondary | ICD-10-CM | POA: Diagnosis not present

## 2021-10-24 DIAGNOSIS — I10 Essential (primary) hypertension: Secondary | ICD-10-CM

## 2021-10-24 DIAGNOSIS — H43813 Vitreous degeneration, bilateral: Secondary | ICD-10-CM

## 2021-10-24 DIAGNOSIS — H353211 Exudative age-related macular degeneration, right eye, with active choroidal neovascularization: Secondary | ICD-10-CM

## 2021-11-06 ENCOUNTER — Ambulatory Visit: Payer: Medicare HMO | Admitting: Cardiovascular Disease

## 2021-11-06 ENCOUNTER — Encounter: Payer: Self-pay | Admitting: Cardiovascular Disease

## 2021-11-06 ENCOUNTER — Other Ambulatory Visit: Payer: Self-pay

## 2021-11-06 DIAGNOSIS — M25473 Effusion, unspecified ankle: Secondary | ICD-10-CM

## 2021-11-06 DIAGNOSIS — K219 Gastro-esophageal reflux disease without esophagitis: Secondary | ICD-10-CM

## 2021-11-06 DIAGNOSIS — I4811 Longstanding persistent atrial fibrillation: Secondary | ICD-10-CM | POA: Diagnosis not present

## 2021-11-06 DIAGNOSIS — I5032 Chronic diastolic (congestive) heart failure: Secondary | ICD-10-CM | POA: Diagnosis not present

## 2021-11-06 DIAGNOSIS — I10 Essential (primary) hypertension: Secondary | ICD-10-CM | POA: Diagnosis not present

## 2021-11-06 DIAGNOSIS — Z853 Personal history of malignant neoplasm of breast: Secondary | ICD-10-CM

## 2021-11-06 DIAGNOSIS — I4891 Unspecified atrial fibrillation: Secondary | ICD-10-CM

## 2021-11-06 DIAGNOSIS — I272 Pulmonary hypertension, unspecified: Secondary | ICD-10-CM

## 2021-11-06 DIAGNOSIS — E785 Hyperlipidemia, unspecified: Secondary | ICD-10-CM | POA: Diagnosis not present

## 2021-11-06 MED ORDER — FUROSEMIDE 40 MG PO TABS: 40.0000 mg | ORAL_TABLET | Freq: Every day | ORAL | 3 refills | Status: DC

## 2021-11-06 NOTE — Patient Instructions (Signed)
Medication Instructions:  TAKE FUROSEMIDE 20MG  AND 40MG  ALTERNATING DAILY.  *If you need a refill on your cardiac medications before your next appointment, please call your pharmacy*  Lab Work:   Testing/Procedures:  NONE    NONE  Special Instructions TAKE FUROSEMIDE AND IF BLOOD PRESSURE CONTINUE TO BE LOW WITH MEDICATION CHANGE TAKE ONLY 20MG  DAILY.  PLEASE CALL SO WE CAN CHANGE YOU RX  Follow-Up: Your next appointment:  3-4 month(s) In Person with Caron Presume, PA-C     Then, Shelva Majestic, MD will plan to see you again in 12 month(s).  Please call our office 2 months in advance to schedule this appointment   At [facility], you and your health needs are our priority.  As part of our continuing mission to provide you with exceptional heart care, we have created designated Provider Care Teams.  These Care Teams include your primary Cardiologist (physician) and Advanced Practice Providers (APPs -  Physician Assistants and Nurse Practitioners) who all work together to provide you with the care you need, when you need it.

## 2021-11-06 NOTE — Progress Notes (Signed)
Cardiology Office Note    Date:  11/14/2021   ID:  Natalie West, DOB 1931-07-29, MRN [id number]  PCP:  Natalie Sacramento, MD  Cardiologist:  Natalie Majestic, MD   F/U  cardiology evaluation initially referred through the courtesy of Natalie Bender, PA-C for evaluation of dizziness and atrial fibrillation.  History of Present Illness:  Natalie West is a 88 y.o. female who was evaluated at [facility] by Natalie Frisk, PA-C with complaints of dizziness.  Her ECG showed atrial fibrillation and she was referred for cardiology evaluation.  I saw her for initial evaluation on November 30, 2019.  She presents for 61-month follow-up evaluation.  Natalie West has been followed by Dr. Kathryne West at [facility] family practice. Recently, she has noticed episodes of dizziness which commenced several weeks previously.  She has been started on dextral LA on October 31, 2019 which she discontinued.  Her dizziness resolved the following day but then returned and she has had waxing and waning episodes.  She has felt that she is off balance and feels that she is going to fall.  She tells me that she has had 2 falls in the past but was uncertain as to when these occurred.  She also has noted associated symptoms of some nausea.  She denied abdominal pain, chest pain, awareness of any heart rate irregularity.  She had been tried on meclizine without benefit.  An ECG done November 13, 2019 revealed atrial fibrillation with a ventricular rate at 61 bpm.  Reportedly the patient has a history of atrial fibrillation for which she has been on atenolol but is not aware if this has been permanent atrial fibrillation or paroxysmal.  In 2016, she had undergone an echo Doppler study on January 14 at [facility] which showed an EF 55 to 60%.  She had normal wall motion.  PA pressure was 39 mm.  Valves were structurally normal.   She has a history of breast cancer, diagnosed in January 2006, treated with  radiation therapy and antiestrogen therapy.  She is felt to be clinically and radiographically free of recurrent disease.  When I saw her for initial evaluation she denied any chest pain.  She continued to experience occasional episodes of lightheadedness and dizziness and her  gait was unbalanced.  During that evaluation it became apparent that she had experienced several falls.  With her unbalanced gait and significant increased risk for recurrent falls after a long discussion I recommended initiation of aspirin therapy alone rather than systemic anticoagulation.  I recommended she undergo a 2D echo Doppler study for assessment of systolic and diastolic function chamber size and valvular architecture.  Also recommended follow-up laboratory.  She had been on pravastatin for hyperlipidemia and her GERD was controlled with omeprazole.  She was taking amitriptyline for depression at bedtime.  Natalie West underwent her echo Doppler study on December 15, 2019.  This showed an EF of 55 to 60%.  There was moderate asymmetric left ventricular hypertrophy.  RV systolic pressure was mildly increased at 37.3.  Laboratory revealed stable hemoglobin hematocrit of 13.3 and 39.1.  Lipid studies are excellent with total cholesterol 123, triglycerides 67 HDL 55 LDL 54.  Serum creatinine was 1.03.  Estimated GFR was 49.  LFTs were normal.  TSH was normal at 2.69 and her magnesium was 2.2.  I last saw her on January 23, 2020.  Over the last several months she  felt fairly well.  She notes  mild ankle swelling right greater than left.  She is unaware of episodes of increased heart rate.  She has not had any recent falls.  During that evaluation, her blood pressure was low and I recommended reduction of her atenolol dose from 25 mg down to 12.5 mg.  She was taking HCTZ 12.5 mg and suggested she may need to change this to as needed.  She was taken meloxicam and I discussed with her potential risk including renal insufficiency and change  this to just as needed.  Since I last saw her, she had developed shortness of breath and leg swelling on June 2022 and was started on Lasix 20 mg by her primary care physician.  She was evaluated by Natalie Presume, PA-C on May 07, 2021 and continues to be short of breath.  Her Lasix was increased to 40 mg.  She was subsequently seen by Natalie West in August 2022 and most recently on August 21, 2021 5 Natalie West.    Presently, her breathing has improved.  She has been using support stockings.  She is on atenolol 12.5 mg daily, furosemide 40 mg daily, and is on pantoprazole for GERD and pravastatin for hyperlipidemia.  She denies chest pain.  She denies palpitations.  She denies dizziness.  She presents for reevaluation.   Past Medical History:  Diagnosis Date   Anxiety    Arthritis    Breast cancer ([place]) 2006   right breast   Chronic cough    Chronic UTI    Depression    Dysrhythmia    Esophageal reflux    Esophageal stricture    Family history of malignant neoplasm of gastrointestinal tract    GERD (gastroesophageal reflux disease)    Hearing loss    Hemorrhoids    internal   History of hiatal hernia    Hypercholesterolemia    Hypertension    Numbness and tingling    feeet, occasionally   Osteopenia    Personal history of colonic polyps 02/09/2007   TUBULAR ADENOMA   Personal history of radiation therapy    PONV (postoperative nausea and vomiting)    trouble opening mouth and jaw, jaw pops, trouble turning head   Rotator cuff tear    RT   Status post dilation of esophageal narrowing    Umbilical hernia     Past Surgical History:  Procedure Laterality Date   BREAST LUMPECTOMY  2006   right lumpectomy   CATARACT EXTRACTION W/ INTRAOCULAR LENS IMPLANT Bilateral    COLONOSCOPY     CYSTOCELE REPAIR N/A 04/20/2016   Procedure: AUGMENTED ANTERIOR VAULT REPAIR, COLOPLAST DERMIS GRAFT Natalie West PLICATION SACROSPINOUS FIXATION;  Surgeon: Carolan Clines, MD;  Location: [facility]  ORS;  Service: Urology;  Laterality: N/A;   DILATION AND CURETTAGE OF UTERUS     ESOPHAGOGASTRODUODENOSCOPY ENDOSCOPY     PUBOVAGINAL SLING N/A 04/20/2016   Procedure: PUBO-VAGINAL [name];  Surgeon: Carolan Clines, MD;  Location: [facility] ORS;  Service: Urology;  Laterality: N/A;   REVISION URINARY SLING N/A 06/12/2016   Procedure: INCISION OF URETHRAL SLING;  Surgeon: Carolan Clines, MD;  Location: [facility] ORS;  Service: Urology;  Laterality: N/A;  1 HOUR   SHOULDER OPEN ROTATOR CUFF REPAIR Right 02/28/2013   Procedure: RIGHT ROTATOR CUFF REPAIR SHOULDER OPEN WITH GRAFT AND ANCHORS;  Surgeon: Tobi Bastos, MD;  Location: [facility] ORS;  Service: Orthopedics;  Laterality: Right;   UMBILICAL HERNIA REPAIR      Current Medications: Outpatient Medications Prior to Visit  Medication Sig Dispense Refill   acetaminophen (TYLENOL) 325 MG tablet Take 650 mg by mouth every 6 (six) hours as needed for moderate pain.     albuterol (PROVENTIL HFA;VENTOLIN HFA) 108 (90 Base) MCG/ACT inhaler Inhale 2 puffs into the lungs every 6 (six) hours as needed for wheezing or shortness of breath. 1 Inhaler 2   amitriptyline (ELAVIL) 25 MG tablet 25 mg 2 (two) times daily.      aspirin EC 81 MG tablet Take 81 mg by mouth daily.     atenolol (TENORMIN) 25 MG tablet Take 0.5 tablets (12.5 mg total) by mouth daily. 45 tablet 3   Calcium Carb-Cholecalciferol (CALCIUM 600 + D PO) Take 1 tablet by mouth daily.     fluticasone (CUTIVATE) 0.005 % ointment Apply 1 application topically daily as needed (itching).     fluticasone (FLONASE) 50 MCG/ACT nasal spray Place 1 spray into both nostrils daily. 16 g 3   hydrocortisone 2.5 % ointment Apply 2.5 mg topically as needed.     meloxicam (MOBIC) 7.5 MG tablet Take 7.5 mg by mouth as needed.     Multiple Vitamin (MULTIVITAMIN WITH MINERALS) TABS tablet Take 1 tablet by mouth daily.     mupirocin ointment (BACTROBAN) 2 % Apply 2 g topically as needed.     pantoprazole (PROTONIX) 40  MG tablet Take 40 mg by mouth daily. 1 Tablet Daily for Heartburn and Acid Reflux     perphenazine (TRILAFON) 2 MG tablet Take 2 mg by mouth 2 (two) times daily.      polyethylene glycol powder (GLYCOLAX/MIRALAX) powder Take 17 g by mouth daily. As Needed     pravastatin (PRAVACHOL) 20 MG tablet Take 20 mg by mouth at bedtime.      potassium chloride SA (KLOR-CON M20) 20 MEQ tablet Take 1 tablet (20 mEq total) by mouth daily. 90 tablet 3   furosemide (LASIX) 40 MG tablet Take 1 tablet (40 mg total) by mouth daily. 90 tablet 3   Facility-Administered Medications Prior to Visit  Medication Dose Route Frequency Provider Last Rate Last Admin   0.9 %  sodium chloride infusion  500 mL Intravenous Continuous Danis, Estill Cotta III, MD         Allergies:   Ciprofloxacin, Clindamycin/lincomycin, Codeine, Diclofenac, Fenoprofen calcium, Flexeril [cyclobenzaprine hcl], Hydrocodone, Macrobid [nitrofurantoin], Noroxin [norfloxacin], Relafen [nabumetone], and Sulfa antibiotics   Social History   Socioeconomic History   Marital status: Widowed    Spouse name: Not on file   Number of children: 1   Years of education: Not on file   Highest education level: Not on file  Occupational History   Occupation: retired    Fish farm manager: RETIRED  Tobacco Use   Smoking status: Never   Smokeless tobacco: Never  Substance and Sexual Activity   Alcohol use: No    Alcohol/week: 0.0 standard drinks   Drug use: No   Sexual activity: Never    Partners: Male  Other Topics Concern   Not on file  Social History Narrative   Not on file   Social Determinants of Health   Financial Resource Strain: Not on file  Food Insecurity: Not on file  Transportation Needs: Not on file  Physical Activity: Not on file  Stress: Not on file  Social Connections: Not on file     Family History:  The patient's family history includes Clotting disorder in her son; Colon cancer (age of onset: 32) in her mother; Emphysema in her father;  Glaucoma in  her mother; Heart disease in her sister; Hypertension in her sister; Kidney cancer in her mother; Lung cancer in her brother; Stomach cancer in her brother; Stroke in her sister.   Her mother died at age 80 and had kidney cancer; father died at age 77 and had emphysema and heart problems, a brother died at age 70 with poor circulation status post bilateral leg amputations, another brother died at age 64 with stomach cancer.  A sister died at age 80 with blood disorder  ROS General: Negative; No fevers, chills, or night sweats;  HEENT: Negative; No changes in vision or hearing, sinus congestion, difficulty swallowing Pulmonary: Negative; No cough, wheezing, shortness of breath, hemoptysis Cardiovascular: See HPI Positive for ankle swelling GI: Negative; No nausea, vomiting, diarrhea, or abdominal pain GU: Negative; No dysuria, hematuria, or difficulty voiding Musculoskeletal: positive for arthritis Hematologic/Oncology: Breast cancer survivor Endocrine: Negative; no heat/cold intolerance; no diabetes Neuro: balance issues Skin: Negative; No rashes or skin lesions Psychiatric: positive for depression Sleep: Negative; No snoring, daytime sleepiness, hypersomnolence, bruxism, restless legs, hypnogognic hallucinations, no cataplexy Other comprehensive 14 point system review is negative.   PHYSICAL EXAM:   VS:  BP 128/81    Pulse 68    Ht 5' 2.5" (1.588 m)    Wt 134 lb 3.2 oz (60.9 kg)    LMP 11/03/1983    SpO2 95%    BMI 24.15 kg/m     Repeat blood pressure by me was 106/80  Wt Readings from Last 3 Encounters:  11/06/21 134 lb 3.2 oz (60.9 kg)  10/09/21 138 lb (62.6 kg)  08/21/21 138 lb (62.6 kg)    General: Alert, oriented, no distress.  Skin: normal turgor, no rashes, warm and dry HEENT: Normocephalic, atraumatic. Pupils equal round and reactive to light; sclera anicteric; extraocular muscles intact;  Nose without nasal septal hypertrophy Mouth/Parynx benign; Mallinpatti  scale 2 Neck: No JVD, no carotid bruits; normal carotid upstroke Lungs: clear to ausculatation and percussion; no wheezing or rales Chest wall: without tenderness to palpitation Heart: PMI not displaced, RRR, s1 s2 normal, 1/6 systolic murmur, no diastolic murmur, no rubs, gallops, thrills, or heaves Abdomen: soft, nontender; no hepatosplenomehaly, BS+; abdominal aorta nontender and not dilated by palpation. Back: no CVA tenderness Pulses 2+ Musculoskeletal: full range of motion, normal strength, no joint deformities Extremities: no clubbing cyanosis or edema, Homan's sign negative  Neurologic: grossly nonfocal; Cranial nerves grossly wnl Psychologic: Normal mood and affect    Studies/Labs Reviewed:   November 06, 2021 ECG (independently read by me):  Atrial fibrillation at 62; QTc 472 msec  March 2021 ECG (independently read by me): Atrial fibrillation at 69 bpm.  Poor anterior R wave progression.  Low voltage. QTc interval 446 ms  November 29, 2018 ECG (independently read by me): Atrial fibrillation at 70 bpm; QTc 460 ms  Recent Labs: BMP Latest Ref Rng & Units 08/21/2021 05/29/2021 05/21/2021  Glucose 70 - 99 mg/dL 79 91 78  BUN 8 - 27 mg/dL _0 Creatinine 0.57 - 1.00 mg/dL 1.26(H) 1.11(H) 1.15(H)  BUN/Creat Ratio 12 - 28 16 - 15  Sodium 134 - 144 mmol/L 142 141 142  Potassium mmol/L CANCELED 3.8 4.4  Chloride 96 - 106 mmol/L 104 104 102  CO2 20 - 29 mmol/L _1 Calcium 8.7 - 10.3 mg/dL 8.9 9.1 8.9     Hepatic Function Latest Ref Rng & Units 05/29/2021 05/07/2021 12/06/2019  Total Protein 6.5 - 8.1 g/dL 6.2(L) 6.4  6.4  Albumin 3.5 - 5.0 g/dL 3.8 4.4 4.2  AST 15 - 41 U/L _0 ALT 0 - 44 U/L _1 Alk Phosphatase 38 - 126 U/L 59 78 88  Total Bilirubin 0.3 - 1.2 mg/dL 0.7 0.4 0.5    CBC Latest Ref Rng & Units 05/29/2021 05/07/2021 12/06/2019  WBC 4.0 - 10.5 K/uL 6.3 8.3 7.0  Hemoglobin 12.0 - 15.0 g/dL 11.5(L) 12.1 13.3  Hematocrit 36.0 - 46.0 % 34.1(L) 36.8 39.1   Platelets 150 - 400 K/uL 159 168 198   Lab Results  Component Value Date   MCV 98.6 05/29/2021   MCV 96 05/07/2021   MCV 95 12/06/2019   Lab Results  Component Value Date   TSH 2.690 12/06/2019   No results found for: HGBA1C   BNP    Component Value Date/Time   BNP 283.2 (H) 05/29/2021 1134    ProBNP No results found for: PROBNP   Lipid Panel     Component Value Date/Time   CHOL 119 05/21/2021 1024   TRIG 69 05/21/2021 1024   HDL 50 05/21/2021 1024   CHOLHDL 2.4 05/21/2021 1024   LDLCALC 55 05/21/2021 1024   LABVLDL 14 05/21/2021 1024     RADIOLOGY: No results found.    Additional studies/ records that were reviewed today include:   ECHO: 06/02/2021 IMPRESSIONS   1. Left ventricular ejection fraction, by estimation, is 55 to 60%. The  left ventricle has normal function. The left ventricle has no regional  wall motion abnormalities. Left ventricular diastolic parameters are  indeterminate.   2. Right ventricular systolic function is normal. The right ventricular  size is normal. There is mildly elevated pulmonary artery systolic  pressure.   3. Left atrial size was moderately dilated.   4. The mitral valve is grossly normal. Trivial mitral valve  regurgitation. No evidence of mitral stenosis.   5. The aortic valve is tricuspid. Aortic valve regurgitation is mild. No  aortic stenosis is present.   6. The inferior vena cava is normal in size with <50% respiratory  variability, suggesting right atrial pressure of 8 mmHg.   Comparison(s): A prior study was performed on 12/15/2019. No significant  change from prior study.   ASSESSMENT:    1. Longstanding persistent atrial fibrillation ([place])   2. Essential hypertension   3. Chronic diastolic CHF (congestive heart failure) ([place])   4. Hyperlipidemia, unspecified hyperlipidemia type   5. Ankle edema   6. Mild pulmonary hypertension ([place])   7. Gastroesophageal reflux disease without esophagitis   8.  History of breast cancer     PLAN:  Natalie West is an 88 year old female who has a history of breast CA and underwent successful chemotherapy and antiestrogen therapy and is now felt to be in complete remission.  She has a history of persistent atrial fibrillation and has been on atenolol. When I initially saw her I did not have any records regarding whether this was paroxysmal or permanent atrial fibrillation. Recently, she had noticed episodes of dizziness and lightheadedness.  She had fallen on at least 2 occasions but could not recall exactly when these falls occurred but they had not occurred within the recent month.  She had issues with balance and had been started on Antivert.  She also had been on dextral LA which ultimately was stopped.  Her ECG from November 12, 2018 at [facility] demonstrated atrial fibrillation at 61 bpm.  During her initial evaluation with  me I spent considerable time with her and discussed the risks and benefits of anticoagulation in detail.  I had concerns with her recent falls and unsteady balance increasing her likelihood for recurrent falls and potential bleeding if systemic anticoagulation was initiated.  As result she has been on aspirin therapy and seems to tolerate this well.  Her prior echo Doppler study showed normal systolic function with moderate asymmetric left ventricular hypertrophy.  She did not have any regional wall motion abnormalities.  Her left atrial size was severely dilated and it is highly likely that her atrial fibrillation has been longstanding.  She had mild pulmonary hypertension with estimated RV pressure at 37 mm.  She had developed low blood pressure leading to reduction of her atenolol dose.  She has issues with progressive leg swelling and shortness of breath leading to treatment with furosemide.  Her most recent echo Doppler study from August 2022 continue to show normal systolic function with mild elevation of pulmonary systolic  pressure.  She is now wearing support stockings.  I have recommended slight reduction of her furosemide dosing and to alternate 20 and 40 mg every other day.  Depending upon blood pressure and swelling, it may be possible to further reduce her dose to 20 mg daily.  Her creatinine in October 2022 was 1.26 with a range is consistent with stage IIIb CKD.  She continues to be on pravastatin for hyperlipidemia and pantoprazole for GERD.  I have recommended that she see Natalie Presume, PA in 3 to 4 months and see me in 6 months to 1 year for follow-up evaluation.    Medication Adjustments/Labs and Tests Ordered: Current medicines are reviewed at length with the patient today.  Concerns regarding medicines are outlined above.  Medication changes, Labs and Tests ordered today are listed in the Patient Instructions below. Patient Instructions  Medication Instructions:  TAKE FUROSEMIDE 20MG AND 40MG ALTERNATING DAILY.  *If you need a refill on your cardiac medications before your next appointment, please call your pharmacy*  Lab Work:   Testing/Procedures:  NONE    NONE  Special Instructions TAKE FUROSEMIDE AND IF BLOOD PRESSURE CONTINUE TO BE LOW WITH MEDICATION CHANGE TAKE ONLY 20MG DAILY.  PLEASE CALL SO WE CAN CHANGE YOU RX  Follow-Up: Your next appointment:  3-4 month(s) In Person with Natalie Presume, PA-C     Then, Natalie Majestic, MD will plan to see you again in 12 month(s).  Please call our office 2 months in advance to schedule this appointment   At [facility], you and your health needs are our priority.  As part of our continuing mission to provide you with exceptional heart care, we have created designated Provider Care Teams.  These Care Teams include your primary Cardiologist (physician) and Advanced Practice Providers (APPs -  Physician Assistants and Nurse Practitioners) who all work together to provide you with the care you need, when you need it.     Signed, Natalie Majestic, MD   11/14/2021 12:09 PM    [place] [address], [place], [place], [place]  [address] Phone: [phone]

## 2021-11-14 ENCOUNTER — Encounter: Payer: Self-pay | Admitting: Cardiovascular Disease

## 2021-12-06 ENCOUNTER — Emergency Department (HOSPITAL_COMMUNITY)
Admission: EM | Admit: 2021-12-06 | Discharge: 2021-12-06 | Disposition: A | Discharge status: Home / Self Care | Payer: Medicare HMO | Attending: Emergency Medicine | Admitting: Emergency Medicine

## 2021-12-06 ENCOUNTER — Emergency Department (HOSPITAL_COMMUNITY): Payer: Medicare HMO

## 2021-12-06 ENCOUNTER — Other Ambulatory Visit: Payer: Self-pay

## 2021-12-06 DIAGNOSIS — Z8616 Personal history of COVID-19: Secondary | ICD-10-CM | POA: Insufficient documentation

## 2021-12-06 DIAGNOSIS — J181 Lobar pneumonia, unspecified organism: Secondary | ICD-10-CM | POA: Diagnosis not present

## 2021-12-06 DIAGNOSIS — J189 Pneumonia, unspecified organism: Secondary | ICD-10-CM

## 2021-12-06 DIAGNOSIS — Z7982 Long term (current) use of aspirin: Secondary | ICD-10-CM | POA: Insufficient documentation

## 2021-12-06 DIAGNOSIS — R059 Cough, unspecified: Secondary | ICD-10-CM | POA: Diagnosis present

## 2021-12-06 DIAGNOSIS — M549 Dorsalgia, unspecified: Secondary | ICD-10-CM | POA: Insufficient documentation

## 2021-12-06 MED ORDER — AZITHROMYCIN 250 MG PO TABS: 250.0000 mg | ORAL_TABLET | Freq: Every day | ORAL | 0 refills | Status: DC

## 2021-12-06 MED ORDER — AMOXICILLIN-POT CLAVULANATE 875-125 MG PO TABS: 1.0000 | ORAL_TABLET | Freq: Two times a day (BID) | ORAL | 0 refills | Status: DC

## 2021-12-06 NOTE — ED Provider Notes (Signed)
[name]   CSN: [id number] Arrival date & time: 12/06/21  1324     History  Chief Complaint  Patient presents with   Back Pain   Cough    COVID two weeks ago, complains of lower back pain and cough    Natalie West is a 88 y.o. female with history significant for hypercholesterolemia, GERD, hypertension who was diagnosed with COVID 2 weeks ago who presents from urgent care for evaluation of back pain and worsening cough.  At urgent care, patient received an x-ray which indicated some left-sided early signs of pneumonia.  She was prescribed Augmentin and azithromycin and was told to follow-up in 2 days to recheck x-ray.  Patient states that at some point, that provider called her and canceled her prescriptions and told her to come to the emergency department because we can "read the x-ray better ".  Per chart review, this is not indicated on the urgent care provider's note, and patient was instructed to only come to the ED if she has worsening symptoms.  Patient denies fever, chills, shortness of breath, nausea, vomiting, diarrhea.   Back Pain Associated symptoms: no abdominal pain, no fever and no headaches   Cough Associated symptoms: no fever, no headaches, no rash and no shortness of breath       Home Medications Prior to Admission medications   Medication Sig Start Date End Date Taking? Authorizing Provider  acetaminophen (TYLENOL) 325 MG tablet Take 650 mg by mouth every 6 (six) hours as needed for moderate pain.    [provider]  albuterol (PROVENTIL HFA;VENTOLIN HFA) 108 (90 Base) MCG/ACT inhaler Inhale 2 puffs into the lungs every 6 (six) hours as needed for wheezing or shortness of breath. 12/12/18   Gardenia Phlegm, NP  amitriptyline (ELAVIL) 25 MG tablet 25 mg 2 (two) times daily.  10/23/18   [provider]  aspirin EC 81 MG tablet Take 81 mg by mouth daily.    [provider]  atenolol  (TENORMIN) 25 MG tablet Take 0.5 tablets (12.5 mg total) by mouth daily. 01/23/20   Troy Sine, MD  Calcium Carb-Cholecalciferol (CALCIUM 600 + D PO) Take 1 tablet by mouth daily.    [provider]  fluticasone (CUTIVATE) 0.005 % ointment Apply 1 application topically daily as needed (itching).    [provider]  fluticasone (FLONASE) 50 MCG/ACT nasal spray Place 1 spray into both nostrils daily. 06/25/21   Hunsucker, Bonna Gains, MD  furosemide (LASIX) 40 MG tablet Take 1 tablet (40 mg total) by mouth daily. 20MG  AND 40MG  ALTERNATING DAILY 11/06/21 02/04/22  Troy Sine, MD  hydrocortisone 2.5 % ointment Apply 2.5 mg topically as needed. 03/24/21   [provider]  meloxicam (MOBIC) 7.5 MG tablet Take 7.5 mg by mouth as needed.    [provider]  Multiple Vitamin (MULTIVITAMIN WITH MINERALS) TABS tablet Take 1 tablet by mouth daily.    [provider]  mupirocin ointment (BACTROBAN) 2 % Apply 2 g topically as needed. 03/24/21   [provider]  pantoprazole (PROTONIX) 40 MG tablet Take 40 mg by mouth daily. 1 Tablet Daily for Heartburn and Acid Reflux    [provider]  perphenazine (TRILAFON) 2 MG tablet Take 2 mg by mouth 2 (two) times daily.  12/01/18   [provider]  polyethylene glycol powder (GLYCOLAX/MIRALAX) powder Take 17 g by mouth daily. As Needed    [provider]  potassium chloride SA (KLOR-CON M20) 20 MEQ tablet Take 1 tablet (20 mEq total) by mouth daily. 05/09/21 08/07/21  Warren Lacy, PA-C  pravastatin (PRAVACHOL) 20 MG tablet Take 20 mg by mouth at bedtime.     [provider]      Allergies    Ciprofloxacin, Clindamycin/lincomycin, Codeine, Diclofenac, Fenoprofen calcium, Flexeril [cyclobenzaprine hcl], Hydrocodone, Macrobid [nitrofurantoin], Noroxin [norfloxacin], Relafen [nabumetone], and Sulfa antibiotics    Review of Systems   Review of Systems  Constitutional:  Negative  for fever.  HENT: Negative.    Eyes: Negative.   Respiratory:  Positive for cough. Negative for shortness of breath.   Cardiovascular: Negative.   Gastrointestinal:  Negative for abdominal pain and vomiting.  Endocrine: Negative.   Genitourinary: Negative.   Musculoskeletal:  Positive for back pain.  Skin:  Negative for rash.  Neurological:  Negative for headaches.  All other systems reviewed and are negative.  Physical Exam Updated Vital Signs BP 140/88 (BP Location: Left Arm)    Pulse 76    Temp 97.9 F (36.6 C) (Oral)    Resp 18    Ht 5' 2.5" (1.588 m)    Wt 59 kg    LMP 11/03/1983    SpO2 96%    BMI 23.40 kg/m  Physical Exam Vitals and nursing note reviewed.  Constitutional:      General: She is not in acute distress.    Appearance: She is not ill-appearing.  HENT:     Head: Atraumatic.  Eyes:     Conjunctiva/sclera: Conjunctivae normal.  Cardiovascular:     Rate and Rhythm: Normal rate and regular rhythm.     Pulses: Normal pulses.     Heart sounds: No murmur heard. Pulmonary:     Effort: Pulmonary effort is normal. No respiratory distress.     Breath sounds: Examination of the left-upper field reveals rhonchi. Examination of the left-middle field reveals rhonchi. Examination of the left-lower field reveals rhonchi. Rhonchi present.     Comments: Patient with findings left-sided inspiratory and expiratory rales.  Right side CTA Abdominal:     General: Abdomen is flat. There is no distension.     Palpations: Abdomen is soft.     Tenderness: There is no abdominal tenderness.  Musculoskeletal:        General: Normal range of motion.     Cervical back: Normal range of motion.  Skin:    General: Skin is warm and dry.     Capillary Refill: Capillary refill takes less than 2 seconds.  Neurological:     General: No focal deficit present.     Mental Status: She is alert.  Psychiatric:        Mood and Affect: Mood normal.    ED Results / Procedures / Treatments    Labs (all labs ordered are listed, but only abnormal results are displayed) Labs Reviewed  BASIC METABOLIC PANEL  CBC WITH DIFFERENTIAL/PLATELET    EKG None  Radiology DG Chest Portable 1 View  Result Date: 12/06/2021 CLINICAL DATA:  Patient was diagnosed with COVID two weeks ago, finished antibiotic this past Monday (5days ago). Patient states she is still short of breath, complains of pain in left lower back, and still coughing. EXAM: PORTABLE CHEST 1 VIEW COMPARISON:  05/29/2021 FINDINGS: Bilateral chronic interstitial thickening. No focal consolidation, pleural effusion or pneumothorax. Chronic elevation of the right diaphragm. Stable cardiomediastinal silhouette. No acute osseous abnormality. IMPRESSION: 1. No acute cardiopulmonary disease. 2. Chronic interstitial lung disease. Electronically  Signed   By: Kathreen Devoid M.D.   On: 12/06/2021 14:53    Procedures Procedures    Medications Ordered in ED Medications - No data to display  ED Course/ Medical Decision Making/ A&P Clinical Course as of 12/06/21 1540  Sat Dec 06, 2021  1452 DG Chest Portable 1 View [EC]    Clinical Course User Index [EC] Tonye Pearson, PA-C                           Medical Decision Making Amount and/or Complexity of Data Reviewed Labs: ordered. Radiology: ordered.   History:  Per HPI External records from outside source obtained and reviewed including urgent records from her visit earlier today and imaging results This patient presents to the ED for concern of cough, this involves an extensive number of treatment options, and is a complaint that carries with it a high risk of complications and morbidity.   Differential diagnosis for emergent cause of cough includes but is not limited to upper respiratory infection, lower respiratory infection, allergies, asthma, irritants, foreign body, medications such as ACE inhibitors, reflux, asthma, CHF, lung cancer, interstitial lung disease, psychiatric  causes, postnasal drip and postinfectious bronchospasm.   Initial impression:  Patient's physical exam was very reassuring.  We had access to the urgent care x-ray which was read and interpreted by radiologist and indicated some early signs of likely pneumonia.  Repeated portable chest x-ray here with nonspecific findings, given her age and symptoms, I will treat this as confirmed pneumonia.  She is afebrile, vitals are otherwise stable.  She is nontoxic-appearing.  She does seem slightly confused and it is unlikely that the urgent care provider called her after her visit to cancel her prescriptions.  Lab Tests and EKG:  I Ordered, reviewed, and interpreted labs and EKG.  The pertinent results in my decision-making regarding them are detailed in the ED course and/or initial impression section above.   Imaging Studies ordered:  I ordered imaging studies including a portable chest x-ray with chronic interstitial lung disease findings I independently visualized and interpreted imaging and I agree with the radiologist interpretation. Decisions made regarding results are detailed in the ED course and/or initial impression section above.   Disposition:  After consideration of the diagnostic results, physical exam, history and the patients response to treatment feel that the patent would benefit from discharge with strict return precautions and outpatient follow-up.   Pneumonia: Patient's portable x-ray here was negative for infiltrate or consolidation, however upon chart review, radiologist there noted some hazy opacities of the left lung.  Given the rales on auscultation and her age, I feel it is appropriate to treat this patient for pneumonia.  She already had azithromycin and Augmentin sent to her pharmacy in [place].  Encouraged her to pick these up today.  Patient is understanding.  All questions asked and answered.  She is to follow-up in 2 or 3 days to repeat x-ray and ensure proper  treatment.  Patient discharged home in stable condition.    Final Clinical Impression(s) / ED Diagnoses Final diagnoses:  Community acquired pneumonia of left lung, unspecified part of lung    Rx / DC Orders ED Discharge Orders     None         Natalie West 12/06/21 1544    Natalie Leigh, MD 12/07/21 1408

## 2021-12-06 NOTE — ED Triage Notes (Signed)
Patient was diagnosed with COVID two weeks ago, finished antibiotic this past Monday (5days ago). Patient states she is still short of breath, complains of pain in left lower back, and still coughing. Also states she is still weak but better than it was. No fever per patient.  Patient went to Urgent Care today because she was not feeling better, had Xray done and states it showed something in her right lower lung, and they suggested she come to ER, that could possibly be pneumonia per patient. Rates pain a 5/10 in lower back.

## 2021-12-06 NOTE — Discharge Instructions (Addendum)
You were diagnosed with pneumonia on xray today. Fortunately, the rest of your exam was reassuring and your oxygen levels look very good. Please pick up the antibiotics prescribed by the urgent care provider from earlier today and start these today. You should also obtain an xray in 2-3 days to check that your pneumonia is healing appropriately. Return if you have worsening difficulty breathing.

## 2021-12-10 ENCOUNTER — Encounter (INDEPENDENT_AMBULATORY_CARE_PROVIDER_SITE_OTHER): Payer: Medicare HMO | Admitting: Ophthalmology

## 2022-01-01 ENCOUNTER — Other Ambulatory Visit (HOSPITAL_BASED_OUTPATIENT_CLINIC_OR_DEPARTMENT_OTHER): Payer: Self-pay | Admitting: Family Medicine

## 2022-01-01 ENCOUNTER — Ambulatory Visit (HOSPITAL_BASED_OUTPATIENT_CLINIC_OR_DEPARTMENT_OTHER)
Admission: RE | Admit: 2022-01-01 | Discharge: 2022-01-01 | Disposition: A | Discharge status: Home / Self Care | Payer: Medicare HMO | Source: Ambulatory Visit | Attending: Family Medicine | Admitting: Family Medicine

## 2022-01-01 ENCOUNTER — Other Ambulatory Visit: Payer: Self-pay

## 2022-01-01 DIAGNOSIS — M7989 Other specified soft tissue disorders: Secondary | ICD-10-CM | POA: Insufficient documentation

## 2022-01-14 ENCOUNTER — Telehealth: Payer: Self-pay | Admitting: Adult Health

## 2022-01-14 NOTE — Telephone Encounter (Signed)
Rescheduled appointment per provider PAL. Patient is aware of the changes made to her upcoming appointment. 

## 2022-01-21 ENCOUNTER — Other Ambulatory Visit: Payer: Self-pay

## 2022-01-21 ENCOUNTER — Emergency Department (HOSPITAL_COMMUNITY): Payer: Medicare Other

## 2022-01-21 ENCOUNTER — Inpatient Hospital Stay (HOSPITAL_COMMUNITY)
Admission: EM | Admit: 2022-01-21 | Discharge: 2022-01-28 | DRG: 552 | Disposition: A | Discharge status: Skilled Nursing Facility | Payer: Medicare Other | Attending: Surgery | Admitting: Surgery

## 2022-01-21 ENCOUNTER — Encounter (HOSPITAL_COMMUNITY): Payer: Self-pay

## 2022-01-21 DIAGNOSIS — M8580 Other specified disorders of bone density and structure, unspecified site: Secondary | ICD-10-CM | POA: Diagnosis present

## 2022-01-21 DIAGNOSIS — Z83511 Family history of glaucoma: Secondary | ICD-10-CM

## 2022-01-21 DIAGNOSIS — S82044A Nondisplaced comminuted fracture of right patella, initial encounter for closed fracture: Secondary | ICD-10-CM | POA: Diagnosis present

## 2022-01-21 DIAGNOSIS — Y9301 Activity, walking, marching and hiking: Secondary | ICD-10-CM | POA: Diagnosis present

## 2022-01-21 DIAGNOSIS — W010XXA Fall on same level from slipping, tripping and stumbling without subsequent striking against object, initial encounter: Secondary | ICD-10-CM | POA: Diagnosis present

## 2022-01-21 DIAGNOSIS — Z882 Allergy status to sulfonamides status: Secondary | ICD-10-CM

## 2022-01-21 DIAGNOSIS — Y92009 Unspecified place in unspecified non-institutional (private) residence as the place of occurrence of the external cause: Secondary | ICD-10-CM | POA: Diagnosis not present

## 2022-01-21 DIAGNOSIS — S22000A Wedge compression fracture of unspecified thoracic vertebra, initial encounter for closed fracture: Secondary | ICD-10-CM

## 2022-01-21 DIAGNOSIS — Z885 Allergy status to narcotic agent status: Secondary | ICD-10-CM

## 2022-01-21 DIAGNOSIS — Z8051 Family history of malignant neoplasm of kidney: Secondary | ICD-10-CM

## 2022-01-21 DIAGNOSIS — Z20822 Contact with and (suspected) exposure to covid-19: Secondary | ICD-10-CM | POA: Diagnosis present

## 2022-01-21 DIAGNOSIS — Z825 Family history of asthma and other chronic lower respiratory diseases: Secondary | ICD-10-CM

## 2022-01-21 DIAGNOSIS — F32A Depression, unspecified: Secondary | ICD-10-CM | POA: Diagnosis not present

## 2022-01-21 DIAGNOSIS — S12031A Nondisplaced posterior arch fracture of first cervical vertebra, initial encounter for closed fracture: Secondary | ICD-10-CM | POA: Diagnosis not present

## 2022-01-21 DIAGNOSIS — E7800 Pure hypercholesterolemia, unspecified: Secondary | ICD-10-CM | POA: Diagnosis present

## 2022-01-21 DIAGNOSIS — Z923 Personal history of irradiation: Secondary | ICD-10-CM

## 2022-01-21 DIAGNOSIS — I110 Hypertensive heart disease with heart failure: Secondary | ICD-10-CM | POA: Diagnosis present

## 2022-01-21 DIAGNOSIS — Z7982 Long term (current) use of aspirin: Secondary | ICD-10-CM | POA: Diagnosis not present

## 2022-01-21 DIAGNOSIS — I482 Chronic atrial fibrillation, unspecified: Secondary | ICD-10-CM | POA: Diagnosis present

## 2022-01-21 DIAGNOSIS — K219 Gastro-esophageal reflux disease without esophagitis: Secondary | ICD-10-CM | POA: Diagnosis not present

## 2022-01-21 DIAGNOSIS — Z800 Family history of malignant neoplasm of digestive organs: Secondary | ICD-10-CM

## 2022-01-21 DIAGNOSIS — Z888 Allergy status to other drugs, medicaments and biological substances status: Secondary | ICD-10-CM | POA: Diagnosis not present

## 2022-01-21 DIAGNOSIS — Z881 Allergy status to other antibiotic agents status: Secondary | ICD-10-CM

## 2022-01-21 DIAGNOSIS — Z8249 Family history of ischemic heart disease and other diseases of the circulatory system: Secondary | ICD-10-CM

## 2022-01-21 DIAGNOSIS — S12110A Anterior displaced Type II dens fracture, initial encounter for closed fracture: Secondary | ICD-10-CM | POA: Diagnosis present

## 2022-01-21 DIAGNOSIS — Z832 Family history of diseases of the blood and blood-forming organs and certain disorders involving the immune mechanism: Secondary | ICD-10-CM

## 2022-01-21 DIAGNOSIS — I5032 Chronic diastolic (congestive) heart failure: Secondary | ICD-10-CM | POA: Diagnosis present

## 2022-01-21 DIAGNOSIS — R339 Retention of urine, unspecified: Secondary | ICD-10-CM | POA: Diagnosis present

## 2022-01-21 DIAGNOSIS — Z801 Family history of malignant neoplasm of trachea, bronchus and lung: Secondary | ICD-10-CM

## 2022-01-21 DIAGNOSIS — S129XXA Fracture of neck, unspecified, initial encounter: Secondary | ICD-10-CM | POA: Diagnosis present

## 2022-01-21 DIAGNOSIS — M899 Disorder of bone, unspecified: Secondary | ICD-10-CM | POA: Diagnosis present

## 2022-01-21 DIAGNOSIS — S0003XA Contusion of scalp, initial encounter: Secondary | ICD-10-CM | POA: Diagnosis present

## 2022-01-21 DIAGNOSIS — Z9221 Personal history of antineoplastic chemotherapy: Secondary | ICD-10-CM

## 2022-01-21 DIAGNOSIS — R296 Repeated falls: Secondary | ICD-10-CM | POA: Diagnosis present

## 2022-01-21 DIAGNOSIS — Z823 Family history of stroke: Secondary | ICD-10-CM

## 2022-01-21 DIAGNOSIS — S12000A Unspecified displaced fracture of first cervical vertebra, initial encounter for closed fracture: Secondary | ICD-10-CM

## 2022-01-21 DIAGNOSIS — S12120A Other displaced dens fracture, initial encounter for closed fracture: Secondary | ICD-10-CM

## 2022-01-21 DIAGNOSIS — Z853 Personal history of malignant neoplasm of breast: Secondary | ICD-10-CM | POA: Diagnosis not present

## 2022-01-21 DIAGNOSIS — W19XXXA Unspecified fall, initial encounter: Principal | ICD-10-CM

## 2022-01-21 LAB — CBC WITH DIFFERENTIAL/PLATELET
Abs Immature Granulocytes: 0.05 10*3/uL (ref 0.00–0.07)
Basophils Absolute: 0.1 10*3/uL (ref 0.0–0.1)
Basophils Relative: 1 %
Eosinophils Absolute: 0.1 10*3/uL (ref 0.0–0.5)
Eosinophils Relative: 1 %
HCT: 37.8 % (ref 36.0–46.0)
Hemoglobin: 12.2 g/dL (ref 12.0–15.0)
Immature Granulocytes: 1 %
Lymphocytes Relative: 11 %
Lymphs Abs: 1 10*3/uL (ref 0.7–4.0)
MCH: 32.4 pg (ref 26.0–34.0)
MCHC: 32.3 g/dL (ref 30.0–36.0)
MCV: 100.5 fL — ABNORMAL HIGH (ref 80.0–100.0)
Monocytes Absolute: 0.6 10*3/uL (ref 0.1–1.0)
Monocytes Relative: 7 %
Neutro Abs: 7.3 10*3/uL (ref 1.7–7.7)
Neutrophils Relative %: 79 %
Platelets: 162 10*3/uL (ref 150–400)
RBC: 3.76 MIL/uL — ABNORMAL LOW (ref 3.87–5.11)
RDW: 13.2 % (ref 11.5–15.5)
WBC: 9 10*3/uL (ref 4.0–10.5)
nRBC: 0 % (ref 0.0–0.2)

## 2022-01-21 LAB — RESP PANEL BY RT-PCR (FLU A&B, COVID) ARPGX2
Influenza A by PCR: NEGATIVE
Influenza B by PCR: NEGATIVE
SARS Coronavirus 2 by RT PCR: NEGATIVE

## 2022-01-21 LAB — BASIC METABOLIC PANEL
Anion gap: 9 (ref 5–15)
BUN: 17 mg/dL (ref 8–23)
CO2: 24 mmol/L (ref 22–32)
Calcium: 8.6 mg/dL — ABNORMAL LOW (ref 8.9–10.3)
Chloride: 106 mmol/L (ref 98–111)
Creatinine, Ser: 0.87 mg/dL (ref 0.44–1.00)
GFR, Estimated: >60 mL/min (ref >60)
Glucose, Bld: 152 mg/dL — ABNORMAL HIGH (ref 70–99)
Potassium: 3.7 mmol/L (ref 3.5–5.1)
Sodium: 139 mmol/L (ref 135–145)

## 2022-01-21 LAB — I-STAT CHEM 8, ED
BUN: 18 mg/dL (ref 8–23)
Calcium, Ion: 1.15 mmol/L (ref 1.15–1.40)
Chloride: 102 mmol/L (ref 98–111)
Creatinine, Ser: 1 mg/dL (ref 0.44–1.00)
Glucose, Bld: 118 mg/dL — ABNORMAL HIGH (ref 70–99)
HCT: 35 % — ABNORMAL LOW (ref 36.0–46.0)
Hemoglobin: 11.9 g/dL — ABNORMAL LOW (ref 12.0–15.0)
Potassium: 3.7 mmol/L (ref 3.5–5.1)
Sodium: 139 mmol/L (ref 135–145)
TCO2: 26 mmol/L (ref 22–32)

## 2022-01-21 LAB — TYPE AND SCREEN
ABO/RH(D): O NEG
Antibody Screen: NEGATIVE

## 2022-01-21 LAB — CBG MONITORING, ED: Glucose-Capillary: 149 mg/dL — ABNORMAL HIGH (ref 70–99)

## 2022-01-21 LAB — PROTIME-INR
INR: 1.1 (ref 0.8–1.2)
Prothrombin Time: 14.3 seconds (ref 11.4–15.2)

## 2022-01-21 LAB — ABO/RH: ABO/RH(D): O NEG

## 2022-01-21 LAB — MRSA NEXT GEN BY PCR, NASAL: MRSA by PCR Next Gen: NOT DETECTED

## 2022-01-21 MED ORDER — POTASSIUM CHLORIDE CRYS ER 20 MEQ PO TBCR
20.0000 meq | EXTENDED_RELEASE_TABLET | Freq: Every day | ORAL | Status: DC
  Administered 2022-01-22 – 2022-01-28 (×7): 20 meq via ORAL
  Filled 2022-01-21 (×7): qty 1

## 2022-01-21 MED ORDER — ATENOLOL 25 MG PO TABS
12.5000 mg | ORAL_TABLET | Freq: Every day | ORAL | Status: DC
  Administered 2022-01-21 – 2022-01-27 (×7): 12.5 mg via ORAL
  Filled 2022-01-21 (×7): qty 1

## 2022-01-21 MED ORDER — IOHEXOL 300 MG/ML  SOLN
80.0000 mL | Freq: Once | INTRAMUSCULAR | Status: AC | PRN
  Administered 2022-01-21: 80 mL via INTRAVENOUS

## 2022-01-21 MED ORDER — PANTOPRAZOLE SODIUM 40 MG IV SOLR: 40.0000 mg | Freq: Every day | INTRAVENOUS | Status: DC

## 2022-01-21 MED ORDER — FENTANYL CITRATE PF 50 MCG/ML IJ SOSY
12.5000 ug | PREFILLED_SYRINGE | INTRAMUSCULAR | Status: DC | PRN
  Administered 2022-01-21 – 2022-01-22 (×2): 12.5 ug via INTRAVENOUS
  Filled 2022-01-21 (×2): qty 1

## 2022-01-21 MED ORDER — FENTANYL CITRATE PF 50 MCG/ML IJ SOSY
50.0000 ug | PREFILLED_SYRINGE | Freq: Once | INTRAMUSCULAR | Status: AC
  Administered 2022-01-21: 50 ug via INTRAVENOUS
  Filled 2022-01-21: qty 1

## 2022-01-21 MED ORDER — FENTANYL CITRATE PF 50 MCG/ML IJ SOSY: 50.0000 ug | PREFILLED_SYRINGE | Freq: Once | INTRAMUSCULAR | Status: DC

## 2022-01-21 MED ORDER — POLYETHYLENE GLYCOL 3350 17 G PO PACK: 17.0000 g | PACK | Freq: Every day | ORAL | Status: DC | PRN

## 2022-01-21 MED ORDER — LIDOCAINE 5 % EX PTCH
2.0000 | MEDICATED_PATCH | CUTANEOUS | Status: DC
  Administered 2022-01-21 – 2022-01-28 (×8): 2 via TRANSDERMAL
  Filled 2022-01-21 (×8): qty 2

## 2022-01-21 MED ORDER — PRAVASTATIN SODIUM 10 MG PO TABS
20.0000 mg | ORAL_TABLET | Freq: Every day | ORAL | Status: DC
  Administered 2022-01-21 – 2022-01-27 (×7): 20 mg via ORAL
  Filled 2022-01-21 (×7): qty 2

## 2022-01-21 MED ORDER — ONDANSETRON HCL 4 MG/2ML IJ SOLN
4.0000 mg | Freq: Once | INTRAMUSCULAR | Status: AC
  Administered 2022-01-21: 4 mg via INTRAVENOUS

## 2022-01-21 MED ORDER — LACTATED RINGERS IV SOLN: INTRAVENOUS | Status: DC

## 2022-01-21 MED ORDER — FENTANYL CITRATE PF 50 MCG/ML IJ SOSY
25.0000 ug | PREFILLED_SYRINGE | Freq: Once | INTRAMUSCULAR | Status: AC
Start: 2022-01-21 — End: 2022-01-21
  Administered 2022-01-21: 25 ug via INTRAVENOUS
  Filled 2022-01-21: qty 1

## 2022-01-21 MED ORDER — ONDANSETRON 4 MG PO TBDP: 4.0000 mg | ORAL_TABLET | Freq: Four times a day (QID) | ORAL | Status: DC | PRN

## 2022-01-21 MED ORDER — ONDANSETRON HCL 4 MG/2ML IJ SOLN
INTRAMUSCULAR | Status: AC
  Administered 2022-01-21: 4 mg via INTRAVENOUS
  Filled 2022-01-21: qty 2

## 2022-01-21 MED ORDER — FENTANYL CITRATE PF 50 MCG/ML IJ SOSY
25.0000 ug | PREFILLED_SYRINGE | Freq: Once | INTRAMUSCULAR | Status: AC
  Administered 2022-01-21: 25 ug via INTRAVENOUS
  Filled 2022-01-21: qty 1

## 2022-01-21 MED ORDER — KCL IN DEXTROSE-NACL 10-5-0.45 MEQ/L-%-% IV SOLN
INTRAVENOUS | Status: DC
  Filled 2022-01-21 (×2): qty 1000

## 2022-01-21 MED ORDER — PANTOPRAZOLE SODIUM 40 MG PO TBEC
40.0000 mg | DELAYED_RELEASE_TABLET | Freq: Every day | ORAL | Status: DC
  Administered 2022-01-21 – 2022-01-28 (×8): 40 mg via ORAL
  Filled 2022-01-21 (×8): qty 1

## 2022-01-21 MED ORDER — ONDANSETRON HCL 4 MG/2ML IJ SOLN
4.0000 mg | Freq: Once | INTRAMUSCULAR | Status: AC
  Filled 2022-01-21: qty 2

## 2022-01-21 MED ORDER — ACETAMINOPHEN 500 MG PO TABS
1000.0000 mg | ORAL_TABLET | Freq: Once | ORAL | Status: AC
  Administered 2022-01-21: 1000 mg via ORAL
  Filled 2022-01-21: qty 2

## 2022-01-21 MED ORDER — AMITRIPTYLINE HCL 25 MG PO TABS
25.0000 mg | ORAL_TABLET | Freq: Two times a day (BID) | ORAL | Status: DC
  Administered 2022-01-21 – 2022-01-28 (×15): 25 mg via ORAL
  Filled 2022-01-21 (×15): qty 1

## 2022-01-21 MED ORDER — SENNOSIDES-DOCUSATE SODIUM 8.6-50 MG PO TABS
1.0000 | ORAL_TABLET | Freq: Two times a day (BID) | ORAL | Status: DC
  Administered 2022-01-21 – 2022-01-28 (×15): 1 via ORAL
  Filled 2022-01-21 (×15): qty 1

## 2022-01-21 MED ORDER — HYDRALAZINE HCL 20 MG/ML IJ SOLN: 10.0000 mg | INTRAMUSCULAR | Status: DC | PRN

## 2022-01-21 MED ORDER — SODIUM CHLORIDE (PF) 0.9 % IJ SOLN
INTRAMUSCULAR | Status: AC
  Filled 2022-01-21: qty 50

## 2022-01-21 MED ORDER — PERPHENAZINE 2 MG PO TABS
2.0000 mg | ORAL_TABLET | Freq: Two times a day (BID) | ORAL | Status: DC
Start: 2022-01-21 — End: 2022-01-28
  Administered 2022-01-21 – 2022-01-28 (×15): 2 mg via ORAL
  Filled 2022-01-21 (×15): qty 1

## 2022-01-21 MED ORDER — ENOXAPARIN SODIUM 30 MG/0.3ML IJ SOSY
30.0000 mg | PREFILLED_SYRINGE | Freq: Two times a day (BID) | INTRAMUSCULAR | Status: DC
  Administered 2022-01-22 – 2022-01-28 (×13): 30 mg via SUBCUTANEOUS
  Filled 2022-01-21 (×13): qty 0.3

## 2022-01-21 MED ORDER — ONDANSETRON HCL 4 MG/2ML IJ SOLN
4.0000 mg | Freq: Four times a day (QID) | INTRAMUSCULAR | Status: DC | PRN
Start: 2022-01-21 — End: 2022-01-28
  Administered 2022-01-21: 4 mg via INTRAVENOUS
  Filled 2022-01-21: qty 2

## 2022-01-21 MED ORDER — TRAMADOL HCL 50 MG PO TABS
50.0000 mg | ORAL_TABLET | Freq: Four times a day (QID) | ORAL | Status: DC | PRN
  Administered 2022-01-21 – 2022-01-28 (×10): 50 mg via ORAL
  Filled 2022-01-21 (×10): qty 1

## 2022-01-21 NOTE — ED Notes (Signed)
Per lab, Type and Screen does not need to be drawn since patient is going to be and ED to ED transfer and this is not a transferable lab order. ?

## 2022-01-21 NOTE — Progress Notes (Signed)
PT Cancellation Note ? ?Patient Details ?Name: Natalie West ?MRN: [id number] ?DOB: 03/27/1931 ? ? ?Cancelled Treatment:    Reason Eval/Treat Not Completed: Medical issues which prohibited therapy-Spoke with RN, requesting PT hold until tomorrow due to pt. Pain levels. PT to follow up acutely as able.  ? ?Thermon Leyland, SPT ?Acute Rehab Services ? ? ? ?Thermon Leyland ?01/21/2022, 3:52 PM ?

## 2022-01-21 NOTE — ED Notes (Signed)
NSURG Dr. Annette Stable at [facility], [place] remains in place.  ?

## 2022-01-21 NOTE — Consult Note (Signed)
Reason for Consult: C1 C2 fracture ?Referring Physician: Trauma surgery ? ?Natalie West is an 88 y.o. female.  ?HPI: 88 year old female status post fall.  Patient with neck pain and right lower extremity pain.  Fall was mechanical.  Not associated with any syncope.  Patient denies numbness paresthesias or weakness.  No dysesthetic pain into her extremities.  Hemodynamically stable. ? ?Past Medical History:  ?Diagnosis Date  ? Anxiety   ? Arthritis   ? Breast cancer ([place]) 2006  ? right breast  ? Chronic cough   ? Chronic UTI   ? Depression   ? Dysrhythmia   ? Esophageal reflux   ? Esophageal stricture   ? Family history of malignant neoplasm of gastrointestinal tract   ? GERD (gastroesophageal reflux disease)   ? Hearing loss   ? Hemorrhoids   ? internal  ? History of hiatal hernia   ? Hypercholesterolemia   ? Hypertension   ? Numbness and tingling   ? feeet, occasionally  ? Osteopenia   ? Personal history of colonic polyps 02/09/2007  ? TUBULAR ADENOMA  ? Personal history of radiation therapy   ? PONV (postoperative nausea and vomiting)   ? trouble opening mouth and jaw, jaw pops, trouble turning head  ? Rotator cuff tear   ? RT  ? Status post dilation of esophageal narrowing   ? Umbilical hernia   ? ? ?Past Surgical History:  ?Procedure Laterality Date  ? BREAST LUMPECTOMY  2006  ? right lumpectomy  ? CATARACT EXTRACTION W/ INTRAOCULAR LENS IMPLANT Bilateral   ? COLONOSCOPY    ? CYSTOCELE REPAIR N/A 04/20/2016  ? Procedure: AUGMENTED ANTERIOR VAULT REPAIR, COLOPLAST DERMIS GRAFT KELLY PLICATION SACROSPINOUS FIXATION;  Surgeon: Carolan Clines, MD;  Location: [facility];  Service: Urology;  Laterality: N/A;  ? DILATION AND CURETTAGE OF UTERUS    ? ESOPHAGOGASTRODUODENOSCOPY ENDOSCOPY    ? PUBOVAGINAL SLING N/A 04/20/2016  ? Procedure: PUBO-VAGINAL [name];  Surgeon: Carolan Clines, MD;  Location: [facility];  Service: Urology;  Laterality: N/A;  ? REVISION URINARY SLING N/A 06/12/2016  ? Procedure: INCISION OF URETHRAL  SLING;  Surgeon: Carolan Clines, MD;  Location: [facility];  Service: Urology;  Laterality: N/A;  1 HOUR  ? SHOULDER OPEN ROTATOR CUFF REPAIR Right 02/28/2013  ? Procedure: RIGHT ROTATOR CUFF REPAIR SHOULDER OPEN WITH GRAFT AND ANCHORS;  Surgeon: Tobi Bastos, MD;  Location: [facility];  Service: Orthopedics;  Laterality: Right;  ? UMBILICAL HERNIA REPAIR    ? ? ?Family History  ?Problem Relation Age of Onset  ? Colon cancer Mother 12  ? Kidney cancer Mother   ? Glaucoma Mother   ? Heart disease Sister   ? Stomach cancer Brother   ? Stroke Sister   ? Lung cancer Brother   ? Clotting disorder Son   ? Emphysema Father   ?     smoker  ? Hypertension Sister   ? ? ?Social History:  reports that she has never smoked. She has never used smokeless tobacco. She reports that she does not drink alcohol and does not use drugs. ? ?Allergies:  ?Allergies  ?Allergen Reactions  ? Ciprofloxacin Other (See Comments)  ?  Reaction unknown  ? Clindamycin/Lincomycin Other (See Comments)  ?  Reaction unknown ?  ? Codeine Nausea And Vomiting  ? Diclofenac Other (See Comments)  ?  Reaction unknown ?  ? Fenoprofen Calcium Other (See Comments)  ?  Reaction unknown ?  ? Flexeril [Cyclobenzaprine Hcl] Other (  See Comments)  ?  Reaction unknown ?  ? Hydrocodone Other (See Comments)  ?  "Last time it was too strong and messed up my mind"  ? Macrobid [Nitrofurantoin] Other (See Comments)  ?  Reaction unknown ?  ? Noroxin [Norfloxacin] Other (See Comments)  ?  Reaction unknown ?  ? Relafen [Nabumetone] Other (See Comments)  ?  Reaction unknown ?  ? Sulfa Antibiotics Other (See Comments)  ?  Reaction unknown ?  ? ? ?Medications: I have reviewed the patient's current medications. ? ?Results for orders placed or performed during the hospital encounter of 01/21/22 (from the past 48 hour(s))  ?Resp Panel by RT-PCR (Flu A&B, Covid) Nasopharyngeal Swab     Status: None  ? Collection Time: 01/21/22  3:58 AM  ? Specimen: Nasopharyngeal Swab;  Nasopharyngeal(NP) swabs in vial transport medium  ?Result Value Ref Range  ? SARS Coronavirus 2 by RT PCR NEGATIVE NEGATIVE  ?  Comment: (NOTE) ?SARS-CoV-2 target nucleic acids are NOT DETECTED. ? ?The SARS-CoV-2 RNA is generally detectable in upper respiratory ?specimens during the acute phase of infection. The lowest ?concentration of SARS-CoV-2 viral copies this assay can detect is ?138 copies/mL. A negative result does not preclude SARS-Cov-2 ?infection and should not be used as the sole basis for treatment or ?other patient management decisions. A negative result may occur with  ?improper specimen collection/handling, submission of specimen other ?than nasopharyngeal swab, presence of viral mutation(s) within the ?areas targeted by this assay, and inadequate number of viral ?copies(<138 copies/mL). A negative result must be combined with ?clinical observations, patient history, and epidemiological ?information. The expected result is Negative. ? ?Fact Sheet for Patients:  ?EntrepreneurPulse.com.au ? ?Fact Sheet for Healthcare Providers:  ?IncredibleEmployment.be ? ?This test is no t yet approved or cleared by the [place] FDA and  ?has been authorized for detection and/or diagnosis of SARS-CoV-2 by ?FDA under an Emergency Use Authorization (EUA). This EUA will remain  ?in effect (meaning this test can be used) for the duration of the ?COVID-19 declaration under Section 564(b)(1) of the Act, 21 ?U.S.C.section 360bbb-3(b)(1), unless the authorization is terminated  ?or revoked sooner.  ? ? ?  ? Influenza A by PCR NEGATIVE NEGATIVE  ? Influenza B by PCR NEGATIVE NEGATIVE  ?  Comment: (NOTE) ?The Xpert Xpress SARS-CoV-2/FLU/RSV plus assay is intended as an aid ?in the diagnosis of influenza from Nasopharyngeal swab specimens and ?should not be used as a sole basis for treatment. Nasal washings and ?aspirates are unacceptable for Xpert Xpress  SARS-CoV-2/FLU/RSV ?testing. ? ?Fact Sheet for Patients: ?EntrepreneurPulse.com.au ? ?Fact Sheet for Healthcare Providers: ?IncredibleEmployment.be ? ?This test is not yet approved or cleared by the [place] FDA and ?has been authorized for detection and/or diagnosis of SARS-CoV-2 by ?FDA under an Emergency Use Authorization (EUA). This EUA will remain ?in effect (meaning this test can be used) for the duration of the ?COVID-19 declaration under Section 564(b)(1) of the Act, 21 U.S.C. ?section 360bbb-3(b)(1), unless the authorization is terminated or ?revoked. ? ?Performed at [facility], [address] ?[place], [place] [address] ?  ?CBC with Differential/Platelet     Status: Abnormal  ? Collection Time: 01/21/22  4:00 AM  ?Result Value Ref Range  ? WBC 9.0 4.0 - 10.5 K/uL  ? RBC 3.76 (L) 3.87 - 5.11 MIL/uL  ? Hemoglobin 12.2 12.0 - 15.0 g/dL  ? HCT 37.8 36.0 - 46.0 %  ? MCV 100.5 (H) 80.0 - 100.0 fL  ? MCH  32.4 26.0 - 34.0 pg  ? MCHC 32.3 30.0 - 36.0 g/dL  ? RDW 13.2 11.5 - 15.5 %  ? Platelets 162 150 - 400 K/uL  ? nRBC 0.0 0.0 - 0.2 %  ? Neutrophils Relative % 79 %  ? Neutro Abs 7.3 1.7 - 7.7 K/uL  ? Lymphocytes Relative 11 %  ? Lymphs Abs 1.0 0.7 - 4.0 K/uL  ? Monocytes Relative 7 %  ? Monocytes Absolute 0.6 0.1 - 1.0 K/uL  ? Eosinophils Relative 1 %  ? Eosinophils Absolute 0.1 0.0 - 0.5 K/uL  ? Basophils Relative 1 %  ? Basophils Absolute 0.1 0.0 - 0.1 K/uL  ? Immature Granulocytes 1 %  ? Abs Immature Granulocytes 0.05 0.00 - 0.07 K/uL  ?  Comment: Performed at [facility], [place] [address], [place], [place] [address]  ?Protime-INR     Status: None  ? Collection Time: 01/21/22  4:00 AM  ?Result Value Ref Range  ? Prothrombin Time 14.3 11.4 - 15.2 seconds  ? INR 1.1 0.8 - 1.2  ?  Comment: (NOTE) ?INR goal varies based on device and disease states. ?Performed at [facility], [address] ?[place], [place]  [id number] ?  ?I-stat chem 8, ED (not at [facility] or [facility])     Status: Abnormal  ? Collection Time: 01/21/22  4:07 AM  ?Result Value Ref Range  ? Sodium 139 135 - 145 mmol/L  ? Potassium 3.7 3.5 - 5.1 mmol/L  ? Chloride 102 98 - 111 mmol/L  ? BUN 18 8 - 23 mg/dL

## 2022-01-21 NOTE — ED Notes (Signed)
Called secondary contact [name] and informed her that Mrs. Raupp had a fall at home tonight and was being transferred to [facility] ED. ?

## 2022-01-21 NOTE — ED Notes (Signed)
[facility] called for transport at this time. 

## 2022-01-21 NOTE — ED Notes (Signed)
Trauma PA at [facility] ?

## 2022-01-21 NOTE — ED Triage Notes (Signed)
PT fell at home while walking to the refrigerator and has a painful swollen right knee. She hit her forehead and has a reddened round half-dollar sized area of bruising. No LOC, no blood thinners. ?

## 2022-01-21 NOTE — ED Provider Notes (Signed)
?[facility] DEPT ?Provider Note ? ? ?CSN: [id number] ?Arrival date & time: 01/21/22  0159 ? ?  ? ?History ? ?Chief Complaint  ?Patient presents with  ? Fall  ?  PT fell at home while walking to the refrigerator and has a painful swollen right knee. She hit her forehead and has a reddened round half-dollar sized area of bruising. No LOC, no blood thinners.  ? ? ?Natalie West is a 88 y.o. female. ? ?The history is provided by the EMS personnel and the patient.  ?Fall ?This is a new problem. The current episode started less than 1 hour ago. The problem occurs constantly. The problem has not changed since onset.Pertinent negatives include no chest pain, no abdominal pain and no shortness of breath. Nothing aggravates the symptoms. Nothing relieves the symptoms. She has tried nothing for the symptoms. The treatment provided no relief.  ?Patient hit head and R knee on refrigerator.  R knee is swollen.  Patient also complains of head pain.   ?  ? ?Home Medications ?Prior to Admission medications   ?Medication Sig Start Date End Date Taking? Authorizing Provider  ?acetaminophen (TYLENOL) 325 MG tablet Take 650 mg by mouth every 6 (six) hours as needed for moderate pain.    [provider]  ?albuterol (PROVENTIL HFA;VENTOLIN HFA) 108 (90 Base) MCG/ACT inhaler Inhale 2 puffs into the lungs every 6 (six) hours as needed for wheezing or shortness of breath. 12/12/18   Gardenia Phlegm, NP  ?amitriptyline (ELAVIL) 25 MG tablet 25 mg 2 (two) times daily.  10/23/18   [provider]  ?amoxicillin-clavulanate (AUGMENTIN) 875-125 MG tablet Take 1 tablet by mouth every 12 (twelve) hours. 12/06/21   Tonye Pearson, PA-C  ?aspirin EC 81 MG tablet Take 81 mg by mouth daily.    [provider]  ?atenolol (TENORMIN) 25 MG tablet Take 0.5 tablets (12.5 mg total) by mouth daily. 01/23/20   Troy Sine, MD  ?azithromycin (ZITHROMAX) 250 MG tablet Take 1 tablet (250 mg total) by  mouth daily. Take first 2 tablets together, then 1 every day until finished. 12/06/21   Tonye Pearson, PA-C  ?Calcium Carb-Cholecalciferol (CALCIUM 600 + D PO) Take 1 tablet by mouth daily.    [provider]  ?fluticasone (CUTIVATE) 0.005 % ointment Apply 1 application topically daily as needed (itching).    [provider]  ?fluticasone (FLONASE) 50 MCG/ACT nasal spray Place 1 spray into both nostrils daily. 06/25/21   Hunsucker, Bonna Gains, MD  ?furosemide (LASIX) 40 MG tablet Take 1 tablet (40 mg total) by mouth daily. '20MG'$  AND '40MG'$  ALTERNATING DAILY 11/06/21 02/04/22  Troy Sine, MD  ?hydrocortisone 2.5 % ointment Apply 2.5 mg topically as needed. 03/24/21   [provider]  ?meloxicam (MOBIC) 7.5 MG tablet Take 7.5 mg by mouth as needed.    [provider]  ?Multiple Vitamin (MULTIVITAMIN WITH MINERALS) TABS tablet Take 1 tablet by mouth daily.    [provider]  ?mupirocin ointment (BACTROBAN) 2 % Apply 2 g topically as needed. 03/24/21   [provider]  ?pantoprazole (PROTONIX) 40 MG tablet Take 40 mg by mouth daily. 1 Tablet Daily for Heartburn and Acid Reflux    [provider]  ?perphenazine (TRILAFON) 2 MG tablet Take 2 mg by mouth 2 (two) times daily.  12/01/18   [provider]  ?polyethylene glycol powder (GLYCOLAX/MIRALAX) powder Take 17 g by mouth daily. As Needed  [provider]  ?potassium chloride SA (KLOR-CON M20) 20 MEQ tablet Take 1 tablet (20 mEq total) by mouth daily. 05/09/21 08/07/21  Warren Lacy, PA-C  ?pravastatin (PRAVACHOL) 20 MG tablet Take 20 mg by mouth at bedtime.     [provider]  ?   ? ?Allergies    ?Ciprofloxacin, Clindamycin/lincomycin, Codeine, Diclofenac, Fenoprofen calcium, Flexeril [cyclobenzaprine hcl], Hydrocodone, Macrobid [nitrofurantoin], Noroxin [norfloxacin], Relafen [nabumetone], and Sulfa antibiotics   ? ?Review of Systems   ?Review of Systems  ?Constitutional:   Negative for fever.  ?HENT:  Negative for facial swelling.   ?Eyes:  Negative for redness.  ?Respiratory:  Negative for shortness of breath.   ?Cardiovascular:  Negative for chest pain.  ?Gastrointestinal:  Negative for abdominal pain.  ?Genitourinary:  Negative for dysuria.  ?Musculoskeletal:  Positive for arthralgias.  ?Skin:  Negative for rash.  ?Psychiatric/Behavioral:  Negative for behavioral problems.   ?All other systems reviewed and are negative. ? ?Physical Exam ?Updated Vital Signs ?BP (!) 134/92 (BP Location: Left Arm)   Pulse 80   Resp 20   Ht '5\' 2"'$  (1.575 m)   Wt 59 kg   LMP 11/03/1983   SpO2 94%   BMI 23.79 kg/m?  ?Physical Exam ?Vitals and nursing note reviewed.  ?Constitutional:   ?   General: She is not in acute distress. ?   Appearance: Normal appearance.  ?HENT:  ?   Head: Normocephalic and atraumatic.  ?   Nose: Nose normal.  ?Eyes:  ?   Extraocular Movements: Extraocular movements intact.  ?   Conjunctiva/sclera: Conjunctivae normal.  ?   Pupils: Pupils are equal, round, and reactive to light.  ?Cardiovascular:  ?   Rate and Rhythm: Normal rate and regular rhythm.  ?   Pulses: Normal pulses.  ?   Heart sounds: Normal heart sounds.  ?Pulmonary:  ?   Effort: Pulmonary effort is normal.  ?   Breath sounds: Normal breath sounds.  ?Abdominal:  ?   General: Bowel sounds are normal.  ?   Palpations: Abdomen is soft.  ?   Tenderness: There is no abdominal tenderness. There is no guarding.  ?Musculoskeletal:  ?   Cervical back: Normal, normal range of motion and neck supple. No tenderness.  ?   Thoracic back: Normal.  ?   Lumbar back: Normal.  ?   Right hip: Normal.  ?   Left hip: Normal.  ?   Right upper leg: Normal.  ?   Left upper leg: Normal.  ?   Right knee: Swelling present. No bony tenderness. No LCL laxity, MCL laxity or ACL laxity.  ?   Instability Tests: Anterior drawer test negative. Posterior drawer test negative.  ?   Left knee: Normal.  ?   Comments: Intact quadriceps and patellar  tendon on the R  ?Skin: ?   General: Skin is warm and dry.  ?   Capillary Refill: Capillary refill takes less than 2 seconds.  ?Neurological:  ?   General: No focal deficit present.  ?   Mental Status: She is alert and oriented to person, place, and time.  ?   Sensory: No sensory deficit.  ?   Motor: No weakness.  ?   Deep Tendon Reflexes: Reflexes normal.  ?Psychiatric:     ?   Mood and Affect: Mood normal.     ?   Behavior: Behavior normal.  ? ? ?ED Results / Procedures / Treatments   ?Labs ?(all labs ordered  are listed, but only abnormal results are displayed) ?Results for orders placed or performed during the hospital encounter of 01/21/22  ?CBC with Differential/Platelet  ?Result Value Ref Range  ? WBC 9.0 4.0 - 10.5 K/uL  ? RBC 3.76 (L) 3.87 - 5.11 MIL/uL  ? Hemoglobin 12.2 12.0 - 15.0 g/dL  ? HCT 37.8 36.0 - 46.0 %  ? MCV 100.5 (H) 80.0 - 100.0 fL  ? MCH 32.4 26.0 - 34.0 pg  ? MCHC 32.3 30.0 - 36.0 g/dL  ? RDW 13.2 11.5 - 15.5 %  ? Platelets 162 150 - 400 K/uL  ? nRBC 0.0 0.0 - 0.2 %  ? Neutrophils Relative % 79 %  ? Neutro Abs 7.3 1.7 - 7.7 K/uL  ? Lymphocytes Relative 11 %  ? Lymphs Abs 1.0 0.7 - 4.0 K/uL  ? Monocytes Relative 7 %  ? Monocytes Absolute 0.6 0.1 - 1.0 K/uL  ? Eosinophils Relative 1 %  ? Eosinophils Absolute 0.1 0.0 - 0.5 K/uL  ? Basophils Relative 1 %  ? Basophils Absolute 0.1 0.0 - 0.1 K/uL  ? Immature Granulocytes 1 %  ? Abs Immature Granulocytes 0.05 0.00 - 0.07 K/uL  ?I-stat chem 8, ED (not at [facility] or [facility])  ?Result Value Ref Range  ? Sodium 139 135 - 145 mmol/L  ? Potassium 3.7 3.5 - 5.1 mmol/L  ? Chloride 102 98 - 111 mmol/L  ? BUN 18 8 - 23 mg/dL  ? Creatinine, Ser 1.00 0.44 - 1.00 mg/dL  ? Glucose, Bld 118 (H) 70 - 99 mg/dL  ? Calcium, Ion 1.15 1.15 - 1.40 mmol/L  ? TCO2 26 22 - 32 mmol/L  ? Hemoglobin 11.9 (L) 12.0 - 15.0 g/dL  ? HCT 35.0 (L) 36.0 - 46.0 %  ? ?CT Head Wo Contrast ? ?Result Date: 01/21/2022 ?CLINICAL DATA:  Fall injury. EXAM: CT HEAD WITHOUT CONTRAST CT CERVICAL SPINE  WITHOUT CONTRAST TECHNIQUE: Multidetector CT imaging of the head and cervical spine was performed following the standard protocol without intravenous contrast. Multiplanar CT image reconstructions of the cervical s

## 2022-01-21 NOTE — ED Notes (Signed)
Pt arrives sitting upright in stretcher by [facility], in a [place] j collar. Pt transferred from EMS stretcher to bed safely w/ personnel maintaining c-spine precautions ?

## 2022-01-21 NOTE — H&P (Signed)
? ? ? ?Natalie West ?03/06/1931  ?[id number].   ? ?Requesting MD: Doren Custard, MD ?Chief Complaint/Reason for Consult: ground level fall with multiple fractures ? ?HPI:  ?Ms. Natalie West is an 88 y/o F with a PMH recurrent falls, GERD, Breast CA s/p lumpectomy chemo/rads in remission, HTN, HLD, a.fib (not anticoagulated), CHF, and depression who presented to the [facility] ED after ground level fall at home. Patient states she was walking from her couch to the fridge on hard floors, wearing sneakers, when her foot got caught on the floor and she feel, striking her head. Denies LOC. Was able to scoot on the floor over the phone to call EMS. Her cc is neck pain, R knee pain, HA and nausea. She denies vomiting. States she has voided once in the ED. She denies chest pain ,abdominal pain, or other extremity pain. She denies use of blood thinners. Denies tobacco or alcohol use. At baseline she lives alone in a one story home in [place], [place]. 4 steps to enter the home. States she ambulates with a cane and drives herself to the store/doctors office when she needs to. Denies any family or friends in the area. Her cardiologist is Dr. Shelva Majestic of [facility]. Patient was transferred to [facility] for evaluation and admission by the trauma service. ? ?ROS: as above ?Review of Systems  ?All other systems reviewed and are negative. ? ?Family History  ?Problem Relation Age of Onset  ? Colon cancer Mother 62  ? Kidney cancer Mother   ? Glaucoma Mother   ? Heart disease Sister   ? Stomach cancer Brother   ? Stroke Sister   ? Lung cancer Brother   ? Clotting disorder Son   ? Emphysema Father   ?     smoker  ? Hypertension Sister   ? ? ?Past Medical History:  ?Diagnosis Date  ? Anxiety   ? Arthritis   ? Breast cancer ([place]) 2006  ? right breast  ? Chronic cough   ? Chronic UTI   ? Depression   ? Dysrhythmia   ? Esophageal reflux   ? Esophageal stricture   ? Family history of malignant neoplasm of gastrointestinal tract   ? GERD  (gastroesophageal reflux disease)   ? Hearing loss   ? Hemorrhoids   ? internal  ? History of hiatal hernia   ? Hypercholesterolemia   ? Hypertension   ? Numbness and tingling   ? feeet, occasionally  ? Osteopenia   ? Personal history of colonic polyps 02/09/2007  ? TUBULAR ADENOMA  ? Personal history of radiation therapy   ? PONV (postoperative nausea and vomiting)   ? trouble opening mouth and jaw, jaw pops, trouble turning head  ? Rotator cuff tear   ? RT  ? Status post dilation of esophageal narrowing   ? Umbilical hernia   ? ? ?Past Surgical History:  ?Procedure Laterality Date  ? BREAST LUMPECTOMY  2006  ? right lumpectomy  ? CATARACT EXTRACTION W/ INTRAOCULAR LENS IMPLANT Bilateral   ? COLONOSCOPY    ? CYSTOCELE REPAIR N/A 04/20/2016  ? Procedure: AUGMENTED ANTERIOR VAULT REPAIR, COLOPLAST DERMIS GRAFT KELLY PLICATION SACROSPINOUS FIXATION;  Surgeon: Carolan Clines, MD;  Location: [facility];  Service: Urology;  Laterality: N/A;  ? DILATION AND CURETTAGE OF UTERUS    ? ESOPHAGOGASTRODUODENOSCOPY ENDOSCOPY    ? PUBOVAGINAL SLING N/A 04/20/2016  ? Procedure: PUBO-VAGINAL [name];  Surgeon: Carolan Clines, MD;  Location: [facility];  Service:  Urology;  Laterality: N/A;  ? REVISION URINARY SLING N/A 06/12/2016  ? Procedure: INCISION OF URETHRAL SLING;  Surgeon: Carolan Clines, MD;  Location: [facility];  Service: Urology;  Laterality: N/A;  1 HOUR  ? SHOULDER OPEN ROTATOR CUFF REPAIR Right 02/28/2013  ? Procedure: RIGHT ROTATOR CUFF REPAIR SHOULDER OPEN WITH GRAFT AND ANCHORS;  Surgeon: Tobi Bastos, MD;  Location: [facility];  Service: Orthopedics;  Laterality: Right;  ? UMBILICAL HERNIA REPAIR    ? ? ?Social History:  reports that she has never smoked. She has never used smokeless tobacco. She reports that she does not drink alcohol and does not use drugs. ? ?Allergies:  ?Allergies  ?Allergen Reactions  ? Ciprofloxacin Other (See Comments)  ?  Reaction unknown  ? Clindamycin/Lincomycin Other (See Comments)  ?   Reaction unknown ?  ? Codeine Nausea And Vomiting  ? Diclofenac Other (See Comments)  ?  Reaction unknown ?  ? Fenoprofen Calcium Other (See Comments)  ?  Reaction unknown ?  ? Flexeril [Cyclobenzaprine Hcl] Other (See Comments)  ?  Reaction unknown ?  ? Hydrocodone Other (See Comments)  ?  "Last time it was too strong and messed up my mind"  ? Macrobid [Nitrofurantoin] Other (See Comments)  ?  Reaction unknown ?  ? Noroxin [Norfloxacin] Other (See Comments)  ?  Reaction unknown ?  ? Relafen [Nabumetone] Other (See Comments)  ?  Reaction unknown ?  ? Sulfa Antibiotics Other (See Comments)  ?  Reaction unknown ?  ? ? ?(Not in a hospital admission) ? ? ? ?Physical Exam: ?Blood pressure 121/79, pulse 76, temperature 97.6 ?F (36.4 ?C), temperature source Oral, resp. rate 14, height '5\' 2"'$  (1.575 m), weight 59 kg, last menstrual period 11/03/1983, SpO2 99 %. ?General: Pleasant white female, appears stated age, laying on hospital bed and appears uncomfortable ?HEENT: head -traumatic with forehead contusion - no laceration or bleeding; Eyes: PERRL, no conjunctival injection; Ears- no external lesions or tenderness, TM visible with no redness or bulging; Nose: nonerythematous, no polyps/masses; lips pink ?Neck- [place] J collar in place ?CV- irregular rhythm, regular rate, radial and dorsalis pedis pulses palpable, no pitting edema ?Pulm- breathing is non-labored on 4L [place]. Crackles left posterior lung field, no wheezes, or rhoncho ?Abd- soft, NT/ND, appropriate bowel sounds in 4 quadrants, no masses, hernias, or organomegaly. ?GU- normal external exam, no foley ?MSK- UE symmetrical, no cyanosis, clubbing, or edema, RLE with knee immobilizer in place, moving upper extremities and left lower extremity spontaneously, NVI all 4 extremities ?Neuro- CN II-XII grossly in tact, no paresthesias. ?Psych- Alert and Oriented x3 with appropriate affect ?Skin: warm and dry, no rashes or lesions ? ?Results for orders placed or performed  during the hospital encounter of 01/21/22 (from the past 48 hour(s))  ?Resp Panel by RT-PCR (Flu A&B, Covid) Nasopharyngeal Swab     Status: None  ? Collection Time: 01/21/22  3:58 AM  ? Specimen: Nasopharyngeal Swab; Nasopharyngeal(NP) swabs in vial transport medium  ?Result Value Ref Range  ? SARS Coronavirus 2 by RT PCR NEGATIVE NEGATIVE  ?  Comment: (NOTE) ?SARS-CoV-2 target nucleic acids are NOT DETECTED. ? ?The SARS-CoV-2 RNA is generally detectable in upper respiratory ?specimens during the acute phase of infection. The lowest ?concentration of SARS-CoV-2 viral copies this assay can detect is ?138 copies/mL. A negative result does not preclude SARS-Cov-2 ?infection and should not be used as the sole basis for treatment or ?other patient management decisions. A negative result may occur  with  ?improper specimen collection/handling, submission of specimen other ?than nasopharyngeal swab, presence of viral mutation(s) within the ?areas targeted by this assay, and inadequate number of viral ?copies(<138 copies/mL). A negative result must be combined with ?clinical observations, patient history, and epidemiological ?information. The expected result is Negative. ? ?Fact Sheet for Patients:  ?EntrepreneurPulse.com.au ? ?Fact Sheet for Healthcare Providers:  ?IncredibleEmployment.be ? ?This test is no t yet approved or cleared by the [place] FDA and  ?has been authorized for detection and/or diagnosis of SARS-CoV-2 by ?FDA under an Emergency Use Authorization (EUA). This EUA will remain  ?in effect (meaning this test can be used) for the duration of the ?COVID-19 declaration under Section 564(b)(1) of the Act, 21 ?U.S.C.section 360bbb-3(b)(1), unless the authorization is terminated  ?or revoked sooner.  ? ? ?  ? Influenza A by PCR NEGATIVE NEGATIVE  ? Influenza B by PCR NEGATIVE NEGATIVE  ?  Comment: (NOTE) ?The Xpert Xpress SARS-CoV-2/FLU/RSV plus assay is intended as an  aid ?in the diagnosis of influenza from Nasopharyngeal swab specimens and ?should not be used as a sole basis for treatment. Nasal washings and ?aspirates are unacceptable for Xpert Xpress SARS-CoV-2/FLU/RSV ?testing

## 2022-01-21 NOTE — TOC CAGE-AID Note (Signed)
Transition of Care (TOC) - CAGE-AID Screening ? ? ?Patient Details  ?Name: Natalie West ?MRN: [id number] ?Date of Birth: 02-09-1931 ? ?Transition of Care (TOC) CM/SW Contact:    ?Jamicia Haaland C Tarpley-Carter, LCSWA ?Phone Number: ?01/21/2022, 11:07 AM ? ? ?Clinical Narrative: ?Pt participated in [place].  Pt stated she does not use substance or ETOH.  Pt was not offered resources, due to no usage of substance or ETOH.   ? ?Passenger transport manager, MSW, LCSW-A ?Pronouns:  She/Her/Hers ?[facility] of [facility] ?Clinical Social Worker ?Direct Number:  [phone] ?[email or web address] ? ?CAGE-AID Screening: ?  ? ?Have You Ever Felt You Ought to Cut Down on Your Drinking or Drug Use?: No ?Have People Annoyed You By Critizing Your Drinking Or Drug Use?: No ?Have You Felt Bad Or Guilty About Your Drinking Or Drug Use?: No ?Have You Ever Had a Drink or Used Drugs First Thing In The Morning to Steady Your Nerves or to Get Rid of a Hangover?: No ?CAGE-AID Score: 0 ? ?Substance Abuse Education Offered: No ? ?  ? ? ? ? ? ? ?

## 2022-01-21 NOTE — ED Notes (Signed)
Called [name] and left a message on the voicemail. ?

## 2022-01-21 NOTE — Consult Note (Signed)
Reason for Consult:Right patella fx ?Referring Physician: April Palumbo ?Time called: 0804 ?Time at bedside: 0856 ? ? ?Natalie West is an 88 y.o. female.  ?HPI: Natalie West was at the ALF where she resides when her shoe got stuck on the floor and she lost her balance and fell. She hurt her knee and was unable to get up or bear weight. She was brought to the ED where x-rays showed a right patella fx in addition to other injuries and orthopedic surgery was consulted.  ? ?Past Medical History:  ?Diagnosis Date  ? Anxiety   ? Arthritis   ? Breast cancer ([place]) 2006  ? right breast  ? Chronic cough   ? Chronic UTI   ? Depression   ? Dysrhythmia   ? Esophageal reflux   ? Esophageal stricture   ? Family history of malignant neoplasm of gastrointestinal tract   ? GERD (gastroesophageal reflux disease)   ? Hearing loss   ? Hemorrhoids   ? internal  ? History of hiatal hernia   ? Hypercholesterolemia   ? Hypertension   ? Numbness and tingling   ? feeet, occasionally  ? Osteopenia   ? Personal history of colonic polyps 02/09/2007  ? TUBULAR ADENOMA  ? Personal history of radiation therapy   ? PONV (postoperative nausea and vomiting)   ? trouble opening mouth and jaw, jaw pops, trouble turning head  ? Rotator cuff tear   ? RT  ? Status post dilation of esophageal narrowing   ? Umbilical hernia   ? ? ?Past Surgical History:  ?Procedure Laterality Date  ? BREAST LUMPECTOMY  2006  ? right lumpectomy  ? CATARACT EXTRACTION W/ INTRAOCULAR LENS IMPLANT Bilateral   ? COLONOSCOPY    ? CYSTOCELE REPAIR N/A 04/20/2016  ? Procedure: AUGMENTED ANTERIOR VAULT REPAIR, COLOPLAST DERMIS GRAFT KELLY PLICATION SACROSPINOUS FIXATION;  Surgeon: Carolan Clines, MD;  Location: [facility];  Service: Urology;  Laterality: N/A;  ? DILATION AND CURETTAGE OF UTERUS    ? ESOPHAGOGASTRODUODENOSCOPY ENDOSCOPY    ? PUBOVAGINAL SLING N/A 04/20/2016  ? Procedure: PUBO-VAGINAL [name];  Surgeon: Carolan Clines, MD;  Location: [facility];  Service: Urology;  Laterality:  N/A;  ? REVISION URINARY SLING N/A 06/12/2016  ? Procedure: INCISION OF URETHRAL SLING;  Surgeon: Carolan Clines, MD;  Location: [facility];  Service: Urology;  Laterality: N/A;  1 HOUR  ? SHOULDER OPEN ROTATOR CUFF REPAIR Right 02/28/2013  ? Procedure: RIGHT ROTATOR CUFF REPAIR SHOULDER OPEN WITH GRAFT AND ANCHORS;  Surgeon: Tobi Bastos, MD;  Location: [facility];  Service: Orthopedics;  Laterality: Right;  ? UMBILICAL HERNIA REPAIR    ? ? ?Family History  ?Problem Relation Age of Onset  ? Colon cancer Mother 65  ? Kidney cancer Mother   ? Glaucoma Mother   ? Heart disease Sister   ? Stomach cancer Brother   ? Stroke Sister   ? Lung cancer Brother   ? Clotting disorder Son   ? Emphysema Father   ?     smoker  ? Hypertension Sister   ? ? ?Social History:  reports that she has never smoked. She has never used smokeless tobacco. She reports that she does not drink alcohol and does not use drugs. ? ?Allergies:  ?Allergies  ?Allergen Reactions  ? Ciprofloxacin Other (See Comments)  ?  Reaction unknown  ? Clindamycin/Lincomycin Other (See Comments)  ?  Reaction unknown ?  ? Codeine Nausea And Vomiting  ? Diclofenac Other (See Comments)  ?  Reaction unknown ?  ? Fenoprofen Calcium Other (See Comments)  ?  Reaction unknown ?  ? Flexeril [Cyclobenzaprine Hcl] Other (See Comments)  ?  Reaction unknown ?  ? Hydrocodone Other (See Comments)  ?  "Last time it was too strong and messed up my mind"  ? Macrobid [Nitrofurantoin] Other (See Comments)  ?  Reaction unknown ?  ? Noroxin [Norfloxacin] Other (See Comments)  ?  Reaction unknown ?  ? Relafen [Nabumetone] Other (See Comments)  ?  Reaction unknown ?  ? Sulfa Antibiotics Other (See Comments)  ?  Reaction unknown ?  ? ? ?Medications: I have reviewed the patient's current medications. ? ?Results for orders placed or performed during the hospital encounter of 01/21/22 (from the past 48 hour(s))  ?Resp Panel by RT-PCR (Flu A&B, Covid) Nasopharyngeal Swab     Status: None  ?  Collection Time: 01/21/22  3:58 AM  ? Specimen: Nasopharyngeal Swab; Nasopharyngeal(NP) swabs in vial transport medium  ?Result Value Ref Range  ? SARS Coronavirus 2 by RT PCR NEGATIVE NEGATIVE  ?  Comment: (NOTE) ?SARS-CoV-2 target nucleic acids are NOT DETECTED. ? ?The SARS-CoV-2 RNA is generally detectable in upper respiratory ?specimens during the acute phase of infection. The lowest ?concentration of SARS-CoV-2 viral copies this assay can detect is ?138 copies/mL. A negative result does not preclude SARS-Cov-2 ?infection and should not be used as the sole basis for treatment or ?other patient management decisions. A negative result may occur with  ?improper specimen collection/handling, submission of specimen other ?than nasopharyngeal swab, presence of viral mutation(s) within the ?areas targeted by this assay, and inadequate number of viral ?copies(<138 copies/mL). A negative result must be combined with ?clinical observations, patient history, and epidemiological ?information. The expected result is Negative. ? ?Fact Sheet for Patients:  ?EntrepreneurPulse.com.au ? ?Fact Sheet for Healthcare Providers:  ?IncredibleEmployment.be ? ?This test is no t yet approved or cleared by the [place] FDA and  ?has been authorized for detection and/or diagnosis of SARS-CoV-2 by ?FDA under an Emergency Use Authorization (EUA). This EUA will remain  ?in effect (meaning this test can be used) for the duration of the ?COVID-19 declaration under Section 564(b)(1) of the Act, 21 ?U.S.C.section 360bbb-3(b)(1), unless the authorization is terminated  ?or revoked sooner.  ? ? ?  ? Influenza A by PCR NEGATIVE NEGATIVE  ? Influenza B by PCR NEGATIVE NEGATIVE  ?  Comment: (NOTE) ?The Xpert Xpress SARS-CoV-2/FLU/RSV plus assay is intended as an aid ?in the diagnosis of influenza from Nasopharyngeal swab specimens and ?should not be used as a sole basis for treatment. Nasal washings  and ?aspirates are unacceptable for Xpert Xpress SARS-CoV-2/FLU/RSV ?testing. ? ?Fact Sheet for Patients: ?EntrepreneurPulse.com.au ? ?Fact Sheet for Healthcare Providers: ?IncredibleEmployment.be ? ?This test is not yet approved or cleared by the [place] FDA and ?has been authorized for detection and/or diagnosis of SARS-CoV-2 by ?FDA under an Emergency Use Authorization (EUA). This EUA will remain ?in effect (meaning this test can be used) for the duration of the ?COVID-19 declaration under Section 564(b)(1) of the Act, 21 U.S.C. ?section 360bbb-3(b)(1), unless the authorization is terminated or ?revoked. ? ?Performed at [facility], [address] ?[place], [place] [address] ?  ?CBC with Differential/Platelet     Status: Abnormal  ? Collection Time: 01/21/22  4:00 AM  ?Result Value Ref Range  ? WBC 9.0 4.0 - 10.5 K/uL  ? RBC 3.76 (L) 3.87 - 5.11 MIL/uL  ? Hemoglobin 12.2 12.0 -  15.0 g/dL  ? HCT 37.8 36.0 - 46.0 %  ? MCV 100.5 (H) 80.0 - 100.0 fL  ? MCH 32.4 26.0 - 34.0 pg  ? MCHC 32.3 30.0 - 36.0 g/dL  ? RDW 13.2 11.5 - 15.5 %  ? Platelets 162 150 - 400 K/uL  ? nRBC 0.0 0.0 - 0.2 %  ? Neutrophils Relative % 79 %  ? Neutro Abs 7.3 1.7 - 7.7 K/uL  ? Lymphocytes Relative 11 %  ? Lymphs Abs 1.0 0.7 - 4.0 K/uL  ? Monocytes Relative 7 %  ? Monocytes Absolute 0.6 0.1 - 1.0 K/uL  ? Eosinophils Relative 1 %  ? Eosinophils Absolute 0.1 0.0 - 0.5 K/uL  ? Basophils Relative 1 %  ? Basophils Absolute 0.1 0.0 - 0.1 K/uL  ? Immature Granulocytes 1 %  ? Abs Immature Granulocytes 0.05 0.00 - 0.07 K/uL  ?  Comment: Performed at [facility], [place] [address], [place], [place] [address]  ?Protime-INR     Status: None  ? Collection Time: 01/21/22  4:00 AM  ?Result Value Ref Range  ? Prothrombin Time 14.3 11.4 - 15.2 seconds  ? INR 1.1 0.8 - 1.2  ?  Comment: (NOTE) ?INR goal varies based on device and disease states. ?Performed at [facility], [address] ?[place], [place] [address] ?  ?I-stat chem 8, ED (not at [facility] or [facility])     Status: Abnormal  ? Collection Time: 01/21/22  4:07 AM  ?Result Value Ref Range  ? Sodium 139 135 - 145 mmol/L  ? [place]

## 2022-01-21 NOTE — ED Provider Notes (Signed)
Patient arrives in transfer from [facility].  Previously seen by Dr. Nicholes Stairs.  She had a fall at home.  Imaging studies at [facility] long show the following: C1/C2 fractures, patella fracture of right knee.  Consultations have been made with trauma surgery (Dr. Donne Hazel), orthopedics (Dr. Mable Fill), and neurosurgery (Dr. Reinaldo Meeker on-call for Dr. Annette Stable). ?Physical Exam  ?BP 137/74   Pulse 81   Temp 97.6 ?F (36.4 ?C) (Oral)   Resp 17   Ht '5\' 2"'$  (1.575 m)   Wt 59 kg   LMP 11/03/1983   SpO2 98%   BMI 23.79 kg/m?  ? ?Physical Exam ?Constitutional:   ?   General: She is not in acute distress. ?   Appearance: She is normal weight. She is not toxic-appearing or diaphoretic.  ?HENT:  ?   Nose: Nose normal.  ?Eyes:  ?   Extraocular Movements: Extraocular movements intact.  ?Neck:  ?   Comments: [place] J cervical collar in place ?Cardiovascular:  ?   Rate and Rhythm: Normal rate. Rhythm irregular.  ?Pulmonary:  ?   Effort: Pulmonary effort is normal.  ?Abdominal:  ?   Palpations: Abdomen is soft.  ?Musculoskeletal:  ?   Comments: Right knee immobilizer in place  ?Skin: ?   General: Skin is warm and dry.  ?Neurological:  ?   General: No focal deficit present.  ?   Mental Status: She is alert and oriented to person, place, and time.  ?Psychiatric:     ?   Mood and Affect: Mood normal.     ?   Behavior: Behavior normal.  ? ? ?Procedures  ?Procedures ? ?ED Course / MDM  ?  ?Medical Decision Making ?Amount and/or Complexity of Data Reviewed ?Labs: ordered. ?Radiology: ordered. ? ?Risk ?OTC drugs. ?Prescription drug management. ?Decision regarding hospitalization. ? ? ?On assessment, patient is alert and oriented.  Vital signs are normal.  She does endorse pain in the back of her neck and head.  She also endorses nausea.  Fentanyl and Zofran ordered.  Trauma surgery consulted.  Patient is in atrial fibrillation with normal rate.  This is not a new diagnosis.  Atrial fibrillation is documented in prior clinic visits.  She  does take a daily aspirin but is not on anticoagulation due to her fall risk.  Patient was admitted for further management. ? ? ? ? ?  ?Godfrey Pick, MD ?01/21/22 1608 ? ?

## 2022-01-21 NOTE — Progress Notes (Signed)
Trauma Event Note ? ? ? ?Rounding on pt in ED- Dr. Annette Stable at bedside on my arrival- ?Assisted primary RN with bedpan- pt had soft BM, peri care performed- Mepalex placed on coccyx area- no skin breakdown noted. Pants cut and removed, with knee immobilizer put back in place, C-collar adjusted, pt had good sensation to all extremities.  ? ? ?Last imported Vital Signs ?BP 118/87   Pulse 80   Temp 97.6 ?F (36.4 ?C) (Oral)   Resp 15   Ht '5\' 2"'$  (1.575 m)   Wt 130 lb 1.1 oz (59 kg)   LMP 11/03/1983   SpO2 99%   BMI 23.79 kg/m?  ? ?Trending CBC ?Recent Labs  ?  01/21/22 ?0400 01/21/22 ?0407  ?WBC 9.0  --   ?HGB 12.2 11.9*  ?HCT 37.8 35.0*  ?PLT 162  --   ? ? ?Trending Coag's ?Recent Labs  ?  01/21/22 ?0400  ?INR 1.1  ? ? ?Trending BMET ?Recent Labs  ?  01/21/22 ?0407  ?NA 139  ?K 3.7  ?CL 102  ?BUN 18  ?CREATININE 1.00  ?GLUCOSE 118*  ? ? ? ? ?Natalie West Natalie West  ?Trauma Response RN ? ?Please call TRN at [phone] for further assistance. ? ? ?  ? ?

## 2022-01-21 NOTE — ED Notes (Addendum)
Report received from night shift. Recently received from [facility] to see Trauma, ortho and [place]. EDP in to see, at [facility]. Pt alert, NAD, calm, interactive, resps e/u, speaking in clear complete sentences, VSS. C-collar and R knee immobilizer in place. Verbalizes needing to have a BM. Placed on fx pan. CBIR. Pt anxious.  ?

## 2022-01-22 DIAGNOSIS — S12031A Nondisplaced posterior arch fracture of first cervical vertebra, initial encounter for closed fracture: Secondary | ICD-10-CM | POA: Diagnosis not present

## 2022-01-22 DIAGNOSIS — M899 Disorder of bone, unspecified: Secondary | ICD-10-CM | POA: Diagnosis not present

## 2022-01-22 LAB — COMPREHENSIVE METABOLIC PANEL
ALT: 10 U/L (ref 0–44)
AST: 18 U/L (ref 15–41)
Albumin: 3.1 g/dL — ABNORMAL LOW (ref 3.5–5.0)
Alkaline Phosphatase: 70 U/L (ref 38–126)
Anion gap: 3 — ABNORMAL LOW (ref 5–15)
BUN: 14 mg/dL (ref 8–23)
CO2: 27 mmol/L (ref 22–32)
Calcium: 8.4 mg/dL — ABNORMAL LOW (ref 8.9–10.3)
Chloride: 106 mmol/L (ref 98–111)
Creatinine, Ser: 0.75 mg/dL (ref 0.44–1.00)
GFR, Estimated: >60 mL/min (ref >60)
Glucose, Bld: 126 mg/dL — ABNORMAL HIGH (ref 70–99)
Potassium: 4 mmol/L (ref 3.5–5.1)
Sodium: 136 mmol/L (ref 135–145)
Total Bilirubin: 0.7 mg/dL (ref 0.3–1.2)
Total Protein: 5.9 g/dL — ABNORMAL LOW (ref 6.5–8.1)

## 2022-01-22 LAB — CBC
HCT: 33.8 % — ABNORMAL LOW (ref 36.0–46.0)
Hemoglobin: 10.9 g/dL — ABNORMAL LOW (ref 12.0–15.0)
MCH: 32.4 pg (ref 26.0–34.0)
MCHC: 32.2 g/dL (ref 30.0–36.0)
MCV: 100.6 fL — ABNORMAL HIGH (ref 80.0–100.0)
Platelets: 160 10*3/uL (ref 150–400)
RBC: 3.36 MIL/uL — ABNORMAL LOW (ref 3.87–5.11)
RDW: 13.2 % (ref 11.5–15.5)
WBC: 12.4 10*3/uL — ABNORMAL HIGH (ref 4.0–10.5)
nRBC: 0 % (ref 0.0–0.2)

## 2022-01-22 MED ORDER — FUROSEMIDE 20 MG PO TABS
20.0000 mg | ORAL_TABLET | Freq: Every day | ORAL | Status: DC
  Administered 2022-01-22 – 2022-01-28 (×7): 20 mg via ORAL
  Filled 2022-01-22 (×7): qty 1

## 2022-01-22 MED ORDER — ASPIRIN EC 81 MG PO TBEC
81.0000 mg | DELAYED_RELEASE_TABLET | Freq: Every day | ORAL | Status: DC
  Administered 2022-01-22 – 2022-01-28 (×7): 81 mg via ORAL
  Filled 2022-01-22 (×7): qty 1

## 2022-01-22 MED ORDER — ACETAMINOPHEN 325 MG PO TABS
650.0000 mg | ORAL_TABLET | Freq: Four times a day (QID) | ORAL | Status: DC
  Administered 2022-01-22 – 2022-01-28 (×22): 650 mg via ORAL
  Filled 2022-01-22 (×24): qty 2

## 2022-01-22 NOTE — Progress Notes (Signed)
[facility] ?Progress Note ? ?   ?Subjective: ?CC:  ?C/o aching posterior head pain and neck pain. HA and nausea improved. Overall her pain is better compared to yesterday. States she got a little confused this AM and thought she was at home. +Flatus and had a BM yesterday. ? ?Confirms she lives in a primate home in [place], her son lives in a trailer behind her house on her property but she states he is unable to assist her. States she already has trouble living on her own at home, uses bedside commode. She is agreeable to SNF or rehab if necessary.  ? ?Objective: ?Vital signs in last 24 hours: ?Temp:  [97.4 ?F (36.3 ?C)-98.4 ?F (36.9 ?C)] 97.6 ?F (36.4 ?C) (03/23 1610) ?Pulse Rate:  [59-80] 73 (03/23 0731) ?Resp:  [10-19] 12 (03/23 0731) ?BP: (107-133)/(62-85) 113/72 (03/23 0731) ?SpO2:  [95 %-100 %] 96 % (03/23 0731) ?Last BM Date : 01/21/22 ? ?Intake/Output from previous day: ?03/22 0701 - 03/23 0700 ?In: 1606.7 [P.O.:440; I.V.:1166.7] ?Out: 1 [Stool:1] ?Intake/Output this shift: ?No intake/output data recorded. ? ?PE: ?Gen:  Alert, NAD, pleasant and cooperative ?HEENT: pupils equal and round, anicteric sclerae, c-collar in place  ?Card:  Regular rate and rhythm, pedal pulses 2+ BL, no pedal edema ?Pulm:  Normal effort on 2L [place], clear to auscultation bilaterally ?Abd: Soft, non-tender, non-distended, bowel sounds present in all 4 quadrants, no HSM, ?Skin: warm and dry, no rashes  ?MSK: RLE in immobilizer, SCD's in place, NVI  ?Psych: A&Ox3  ? ?Lab Results:  ?Recent Labs  ?  01/21/22 ?0400 01/21/22 ?0407 01/22/22 ?0410  ?WBC 9.0  --  12.4*  ?HGB 12.2 11.9* 10.9*  ?HCT 37.8 35.0* 33.8*  ?PLT 162  --  160  ? ?BMET ?Recent Labs  ?  01/21/22 ?0801 01/22/22 ?0410  ?NA 139 136  ?K 3.7 4.0  ?CL 106 106  ?CO2 24 27  ?GLUCOSE 152* 126*  ?BUN 17 14  ?CREATININE 0.87 0.75  ?CALCIUM 8.6* 8.4*  ? ?PT/INR ?Recent Labs  ?  01/21/22 ?0400  ?LABPROT 14.3  ?INR 1.1  ? ?CMP  ?   ?Component Value Date/Time  ? NA 136  01/22/2022 0410  ? NA 142 08/21/2021 1207  ? NA 142 06/28/2014 1400  ? K 4.0 01/22/2022 0410  ? K 3.9 06/28/2014 1400  ? CL 106 01/22/2022 0410  ? CL 109 (H) 03/31/2013 9604  ? CO2 27 01/22/2022 0410  ? CO2 25 06/28/2014 1400  ? GLUCOSE 126 (H) 01/22/2022 0410  ? GLUCOSE 92 06/28/2014 1400  ? GLUCOSE 81 03/31/2013 0917  ? BUN 14 01/22/2022 0410  ? BUN 20 08/21/2021 1207  ? BUN 16.0 06/28/2014 1400  ? CREATININE 0.75 01/22/2022 0410  ? CREATININE 0.82 01/16/2015 1450  ? CREATININE 0.8 06/28/2014 1400  ? CALCIUM 8.4 (L) 01/22/2022 0410  ? CALCIUM 9.1 06/28/2014 1400  ? PROT 5.9 (L) 01/22/2022 0410  ? PROT 6.4 05/07/2021 1410  ? PROT 6.6 06/28/2014 1400  ? ALBUMIN 3.1 (L) 01/22/2022 0410  ? ALBUMIN 4.4 05/07/2021 1410  ? ALBUMIN 3.7 06/28/2014 1400  ? AST 18 01/22/2022 0410  ? AST 25 06/28/2014 1400  ? ALT 10 01/22/2022 0410  ? ALT 16 06/28/2014 1400  ? ALKPHOS 70 01/22/2022 0410  ? ALKPHOS 85 06/28/2014 1400  ? BILITOT 0.7 01/22/2022 0410  ? BILITOT 0.4 05/07/2021 1410  ? BILITOT 0.47 06/28/2014 1400  ? GFRNONAA >60 01/22/2022 0410  ? GFRAA 57 (L) 12/06/2019 1036  ? ?  Lipase  ?No results found for: LIPASE ? ? ? ? ?Studies/Results: ?CT Head Wo Contrast ? ?Result Date: 01/21/2022 ?CLINICAL DATA:  Fall injury. EXAM: CT HEAD WITHOUT CONTRAST CT CERVICAL SPINE WITHOUT CONTRAST TECHNIQUE: Multidetector CT imaging of the head and cervical spine was performed following the standard protocol without intravenous contrast. Multiplanar CT image reconstructions of the cervical spine were also generated. RADIATION DOSE REDUCTION: This exam was performed according to the departmental dose-optimization program which includes automated exposure control, adjustment of the mA and/or kV according to patient size and/or use of iterative reconstruction technique. COMPARISON:  No prior neck imaging or CT spine for comparison. Limited comparison is available with PA and lateral chest radiographs from 05/29/2021. The head CT as compared with  the head CT dated 02/08/2021. FINDINGS: CT HEAD FINDINGS Brain: Is mild-to-moderate cerebral atrophy and atrophic ventriculomegaly with moderate to severe small vessel disease of the cerebral white matter and chronic left temporal lobe infarct. There is mild cerebellar atrophy. There is no asymmetry concerning for an acute infarct, hemorrhage or mass. There are trace calcifications in the basal ganglia. Vascular: There are calcifications in the distal vertebral arteries and siphons but no hyperdense central vessels. Skull: There is osteopenia without evidence of depressed fracture. There is no visible scalp hematoma. Sinuses/Orbits: There are old lens replacements. Visualized sinuses and mastoid air cells are clear. Other: None. CT CERVICAL SPINE FINDINGS Alignment: There is a minimal grade 1 anterolisthesis at C4-5, C5-6, C6-7 and C7-T1, all most likely degenerative in etiology. No traumatic listhesis is seen. There is no widening of the anterior atlantodental joint. There is narrowing and osteophytosis at the anterior atlantodental joint and a loose body between the clivus and dens. Skull base and vertebrae: Generalized osteopenia. There are bilateral nondisplaced C1 laminar fractures at the junction with the C1 lateral masses. Also noted is a transverse oblique type 3 odontoid process fracture through the base of the dens into the C2 body, with mild posterior inclination of the odontoid process at the fracture site and mild distraction. There is no appreciable fracture extension through the C2 transverse foramina. At T3 there is a mild upper plate anterior wedge compression fracture which was seen on last year's PA and lateral chest. No further fractures are identified. There is a 1.1 cm sclerotic lesion in the T1 vertebral body, unknown if this was present on the prior PA and lateral chest or earlier chest films because the area is obscured on lateral views. Soft tissues and spinal canal: There is mild  precervical soft tissue swelling at C1 and C2. There is a minimal retropharyngeal effusion. No visible canal hematoma. There are patchy calcifications of the carotid bifurcations. Disc levels: The discs are collapsed at C4-5, C5-6 and C6-7, with mild disc space loss at C2-3 and C3-4. Only C7-T1 is normal in height. There are bidirectional endplate spurs from O7-0 through C6-7 without spondylotic cord compression. There is multilevel uncinate joint and facet spurring, with ankylosed facet joints C2-4, and foraminal stenosis which is bilaterally mild-to-moderate at C3-4, bilaterally moderate to severe at C5-6, moderate on the left and mild-to-moderate right at C6-7. Other foramina are clear. Upper chest: There are emphysematous changes and subpleural reticulation consistent with subpleural fibrosis. Other: None. IMPRESSION: 1. No acute intracranial CT findings or depressed skull fractures. 2. Atrophy and small-vessel disease with chronic left temporal lobe infarct. 3. Acute transverse oblique type 3 odontoid process fracture through the base of the dens into the C2 body, with mild posterior inclination of  the odontoid process and mild distraction. No extension is seen into the transverse foramina. 4. Nondisplaced fractures of the bilateral C1 posterior arch at the junctions with the lateral masses of C1. 5. Mild chronic upper plate anterior wedge compression fracture of T3. 6. Osteopenia and degenerative changes. 7. 1.1 cm sclerotic lesion of the T1 vertebral body, etiology indeterminate and age indeterminate. 8. Emphysematous and chronic change. Electronically Signed   By: Telford Nab M.D.   On: 01/21/2022 03:30  ? ?CT Cervical Spine Wo Contrast ? ?Result Date: 01/21/2022 ?CLINICAL DATA:  Fall injury. EXAM: CT HEAD WITHOUT CONTRAST CT CERVICAL SPINE WITHOUT CONTRAST TECHNIQUE: Multidetector CT imaging of the head and cervical spine was performed following the standard protocol without intravenous contrast.  Multiplanar CT image reconstructions of the cervical spine were also generated. RADIATION DOSE REDUCTION: This exam was performed according to the departmental dose-optimization program which includes automated exposur

## 2022-01-22 NOTE — [place] FL2 (Signed)
?[place] MEDICAID FL2 LEVEL OF CARE SCREENING TOOL  ?  ? ?IDENTIFICATION  ?Patient Name: ?Natalie West Birthdate: [birth year] Sex: female Admission Date (Current Location): ?01/21/2022  ?[place] and [place] Number: ? [place] ?  Facility and Address:  ?[facility] [place] [place] [address], [place], [place] [address] ?     Provider Number: ?[id number]  ?Attending Physician Name and Address:  ?Md, Trauma, MD ? Relative Name and Phone Number:  ?Barbee Cough, nephew: [phone] ?   ?Current Level of Care: ?Hospital Recommended Level of Care: ?[place] Prior Approval Number: ?  ? ?Date Approved/Denied: ?  PASRR Number: ?[id number] A ? ?Discharge Plan: ?SNF ?  ? ?Current Diagnoses: ?Patient Active Problem List  ? Diagnosis Date Noted  ? Cervical spine fracture ([place]) 01/21/2022  ? Acute diastolic heart failure ([place]) 05/07/2021  ? Mallet finger of left finger(s) 10/08/2016  ? Recurrent urinary tract infection 01/20/2015  ? Cystocele 01/20/2015  ? Constipation 08/22/2014  ? Rotator cuff (capsule) sprain 02/28/2013  ? Breast cancer of lower-outer quadrant of right female breast ([place])   ? Internal hemorrhoids 06/28/2011  ? Gastroenteritis 06/28/2011  ? Hyperlipidemia 02/18/2008  ? GERD 02/18/2008  ? HIATAL HERNIA 02/18/2008  ? OSTEOARTHRITIS 02/18/2008  ? COLONIC POLYPS, ADENOMATOUS 02/09/2007  ? DIVERTICULITIS, ACUTE 02/09/2007  ? ESOPHAGEAL STRICTURE 11/19/1998  ? ? ?Orientation RESPIRATION BLADDER Height & Weight   ?  ?Self, Time, Place, Situation ? Normal External catheter Weight: 59 kg ?Height:  '5\' 2"'$  (157.5 cm)  ?BEHAVIORAL SYMPTOMS/MOOD NEUROLOGICAL BOWEL NUTRITION STATUS  ?    Continent  (soft, thin liquids.  Patient has denture)  ?AMBULATORY STATUS COMMUNICATION OF NEEDS Skin   ?Extensive Assist Verbally Normal ?  ?  ?  ?    ?     ?     ? ? ?Personal Care Assistance Level of Assistance  ?Bathing, Feeding, Dressing Bathing Assistance: Maximum assistance ?Feeding assistance: Limited  assistance ?Dressing Assistance: Maximum assistance ?   ? ?Functional Limitations Info  ?Sight Sight Info: Adequate ?  ?   ? ? ?SPECIAL CARE FACTORS FREQUENCY  ?PT (By licensed PT), OT (By licensed OT)   ?  ?PT Frequency: 5x weekly ?OT Frequency: 5x weekly ?  ?  ?  ?   ? ? ?Contractures Contractures Info: Not present  ? ? ?Additional Factors Info  ?Code Status, Allergies Code Status Info: Full code ?Allergies Info: Ciprofloxacin-reaction unknown; Clindamycin/lincomycin-reaction unknown; Codeine-N/V; Diclofenac-reaction unknown; Fenoprofen Calcium-reaction unknown; Flexeril-reaction unknown; Hydrocodone-confusion; Macrobid-reaction unknown; Noroxin-reaction unknown; Relafen-reaction unknown; Sulfa antibiotics-reaction unknown. ?  ?  ?  ?   ? ?Current Medications (01/22/2022):  This is the current hospital active medication list ?Current Facility-Administered Medications  ?Medication Dose Route Frequency Provider Last Rate Last Admin  ? acetaminophen (TYLENOL) tablet 650 mg  650 mg Oral Q6H Jill Alexanders, PA-C   650 mg at 01/22/22 8527  ? amitriptyline (ELAVIL) tablet 25 mg  25 mg Oral BID Jill Alexanders, PA-C   25 mg at 01/22/22 7824  ? aspirin EC tablet 81 mg  81 mg Oral Daily Jill Alexanders, PA-C   81 mg at 01/22/22 2353  ? atenolol (TENORMIN) tablet 12.5 mg  12.5 mg Oral QHS Jill Alexanders, PA-C   12.5 mg at 01/21/22 2131  ? enoxaparin (LOVENOX) injection 30 mg  30 mg Subcutaneous Q12H Jill Alexanders, PA-C   30 mg at 01/22/22 6144  ? fentaNYL (SUBLIMAZE) injection 12.5-25 mcg  12.5-25 mcg Intravenous Q2H  PRN Jill Alexanders, PA-C   12.5 mcg at 01/22/22 7341  ? furosemide (LASIX) tablet 20 mg  20 mg Oral Daily Georganna Skeans, MD   20 mg at 01/22/22 9379  ? hydrALAZINE (APRESOLINE) injection 10 mg  10 mg Intravenous Q2H PRN Jill Alexanders, PA-C      ? lidocaine (LIDODERM) 5 % 2 patch  2 patch Transdermal Q24H Palumbo, April, MD   2 patch at 01/22/22 0245  ? ondansetron (ZOFRAN-ODT)  disintegrating tablet 4 mg  4 mg Oral Q6H PRN Jill Alexanders, PA-C      ? Or  ? ondansetron (ZOFRAN) injection 4 mg  4 mg Intravenous Q6H PRN Jill Alexanders, PA-C   4 mg at 01/21/22 1349  ? pantoprazole (PROTONIX) EC tablet 40 mg  40 mg Oral Daily Jill Alexanders, PA-C   40 mg at 01/22/22 0240  ? perphenazine (TRILAFON) tablet 2 mg  2 mg Oral BID Jill Alexanders, PA-C   2 mg at 01/22/22 9735  ? polyethylene glycol (MIRALAX / GLYCOLAX) packet 17 g  17 g Oral Daily PRN Jill Alexanders, PA-C      ? potassium chloride SA (KLOR-CON M) CR tablet 20 mEq  20 mEq Oral Daily Jill Alexanders, PA-C   20 mEq at 01/22/22 3299  ? pravastatin (PRAVACHOL) tablet 20 mg  20 mg Oral QHS Jill Alexanders, PA-C   20 mg at 01/21/22 2131  ? senna-docusate (Senokot-S) tablet 1 tablet  1 tablet Oral BID Jill Alexanders, PA-C   1 tablet at 01/22/22 2426  ? traMADol (ULTRAM) tablet 50 mg  50 mg Oral Q6H PRN Jill Alexanders, PA-C   50 mg at 01/22/22 8341  ? ? ? ?Discharge Medications: ?Please see discharge summary for a list of discharge medications. ? ?Relevant Imaging Results: ? ?Relevant Lab Results: ? ? ?Additional Information ?C-collar at all times; knee immobilizer ? ?Reinaldo Raddle, RN, BSN  ?Trauma/Neuro ICU Case Manager ?[phone] ? ? ? ? ?

## 2022-01-22 NOTE — TOC Initial Note (Signed)
Transition of Care (TOC) - Initial/Assessment Note  ? ? ?Patient Details  ?Name: Natalie West ?MRN: [id number] ?Date of Birth: 1931/07/27 ? ?Transition of Care [facility]) CM/SW Contact:    ?Ella Bodo, RN ?Phone Number: ?01/22/2022, 1:43 PM ? ?Clinical Narrative:                 ?88 yo presenting to [facility] on [facility]/22 from home who fell and sustained a R patella fx and odontoid fx and B C1 lamina fxs.  ?PTA, pt fairly independent and living at home alone; she cares for her disabled son, who lives in a trailer beside her. PT/OT recommending SNF; I met with patient to discuss plan, but she was too lethargic to speak with me.  She is agreeable to my calling her nephew, [name], to speak with him about discharge planning.  Spoke with patient's nephew, Barbee Cough:  He is in agreement with PT/OT recommendations for SNF at discharge, and consents to faxing patient info out for SNF bed search. He states that he and his wife have been checking on patient's son (his cousin), and are in process of arranging assistance for him.  Will initiate FL2 and fax out for SNF bed search; will provide bed offers to family when available.   ? ?Expected Discharge Plan: [place] ?Barriers to Discharge: Continued Medical Work up ? ? ?Patient Goals and CMS Choice ?  ?CMS Medicare.gov Compare Post Acute Care list provided to:: Patient Represenative (must comment) ([place]) ?Choice offered to / list presented to :  (Nephew) ? ?Expected Discharge Plan and Services ?Expected Discharge Plan: [place] ?  ?Discharge Planning Services: CM Consult ?Post Acute Care Choice: [place] ?Living arrangements for the past 2 months: [place] ?                ?  ?  ?  ?  ?  ?  ?  ?  ?  ?  ? ?Prior Living Arrangements/Services ?Living arrangements for the past 2 months: [place] ?Lives with:: Self ?Patient language and need for interpreter reviewed:: Yes ?Do you feel safe going back to the place where  you live?: Yes      ?Need for Family Participation in Patient Care: Yes (Comment) ?Care giver support system in place?: No (comment) ?  ?Criminal Activity/Legal Involvement Pertinent to Current Situation/Hospitalization: No - Comment as needed ? ?Activities of Daily Living ?Home Assistive Devices/Equipment: Cane (specify quad or straight) ?ADL Screening (condition at time of admission) ?Patient's cognitive ability adequate to safely complete daily activities?: No ?Is the patient deaf or have difficulty hearing?: No ?Does the patient have difficulty seeing, even when wearing glasses/contacts?: No ?Does the patient have difficulty concentrating, remembering, or making decisions?: No ?Patient able to express need for assistance with ADLs?: Yes ?Does the patient have difficulty dressing or bathing?: Yes ?Independently performs ADLs?: No ?Communication: Independent ?Dressing (OT): Needs assistance ?Is this a change from baseline?: Change from baseline, expected to last >3 days ?Grooming: Needs assistance ?Is this a change from baseline?: Change from baseline, expected to last >3 days ?Feeding: Independent ?Bathing: Needs assistance ?Is this a change from baseline?: Change from baseline, expected to last >3 days ?Toileting: Needs assistance ?Is this a change from baseline?: Change from baseline, expected to last >3days ?In/Out Bed: Needs assistance ?Is this a change from baseline?: Change from baseline, expected to last >3 days ?Walks in Home: Needs assistance ?Is this a change from  baseline?: Change from baseline, expected to last >3 days ?Does the patient have difficulty walking or climbing stairs?: Yes ?Weakness of Legs: Right ?Weakness of Arms/Hands: Both ? ?Permission Sought/Granted ?  ?Permission granted to share information with : Yes, Verbal Permission Granted ? Share Information with NAME: Barbee Cough ?   ? Permission granted to share info w Relationship: Nephew ? Permission granted to share info w Contact  Information: [phone] ? ?Emotional Assessment ?Appearance:: Appears stated age ?Attitude/Demeanor/Rapport: Lethargic ?Affect (typically observed): Unable to Assess ?Orientation: : Oriented to Self, Oriented to Place, Oriented to Situation ?Alcohol / Substance Use: Not Applicable ?  ? ?Admission diagnosis:  Bone lesion [M89.9] ?Cervical spine fracture ([place]) [S12.9XXA] ?Closed nondisplaced comminuted fracture of right patella, initial encounter [S82.044A] ?Fall, initial encounter [W19.XXXA] ?Compression fracture of body of thoracic vertebra ([place]) [S22.000A] ?Other closed displaced odontoid fracture, initial encounter ([place]) [E26.834H] ?Closed displaced fracture of first cervical vertebra, unspecified fracture morphology, initial encounter ([place]) [S12.000A] ?Patient Active Problem List  ? Diagnosis Date Noted  ? Cervical spine fracture ([place]) 01/21/2022  ? Acute diastolic heart failure ([place]) 05/07/2021  ? Mallet finger of left finger(s) 10/08/2016  ? Recurrent urinary tract infection 01/20/2015  ? Cystocele 01/20/2015  ? Constipation 08/22/2014  ? Rotator cuff (capsule) sprain 02/28/2013  ? Breast cancer of lower-outer quadrant of right female breast ([place])   ? Internal hemorrhoids 06/28/2011  ? Gastroenteritis 06/28/2011  ? Hyperlipidemia 02/18/2008  ? GERD 02/18/2008  ? HIATAL HERNIA 02/18/2008  ? OSTEOARTHRITIS 02/18/2008  ? COLONIC POLYPS, ADENOMATOUS 02/09/2007  ? DIVERTICULITIS, ACUTE 02/09/2007  ? ESOPHAGEAL STRICTURE 11/19/1998  ? ?PCP:  Christain Sacramento, MD ?Pharmacy:   ?[id number] [place], [place] - [address] ?[address]?Phone: [phone]: [phone]? ? ? ? ?Social Determinants of Health (SDOH) Interventions ?  ? ?Readmission Risk Interventions ?   ? View : No data to display.  ?  ?  ?  ? ?JReinaldo Raddle RN, BSN  ?Trauma/Neuro ICU Case Manager ?[phone]? ? ?

## 2022-01-22 NOTE — Progress Notes (Signed)
Patient suffers from cervical fracture, patella fracture which impairs their ability to perform daily activities like bathing, dressing, feeding, grooming, and toileting in the home.  A walker will not resolve issue with performing activities of daily living. A wheelchair will allow patient to safely perform daily activities. Patient can safely propel the wheelchair in the home or has a caregiver who can provide assistance. Length of need 6 months . ?Accessories: elevating leg rests (ELRs), wheel locks, extensions and anti-tippers. ? ? ?Obie Dredge, PA-C ?[facility] ?Please see Amion for pager number during day hours 7:00am-4:30pm ? ? ? ? ? ?

## 2022-01-22 NOTE — Evaluation (Signed)
Occupational Therapy Evaluation ?Patient Details ?Name: Natalie West ?MRN: [id number] ?DOB: 09-15-1931 ?Today's Date: 01/22/2022 ? ? ?History of Present Illness 88 yo presenting to [facility] on 3/22 from home who fell and sustained a R patella fx and odontoid fx and B C1 lamina fxs. PMH: recurrent falls, GERD, Breast CA s/p lumpectomy chemo/rads in remission, HTN, HLD, a.fib (not anticoagulated), CHF, and depression.  ? ?Clinical Impression ?  ?PTA pt lives at home alone @ modified independent level, including driving. She reports she is the caregiver for her disabled son who lives beside her in a trailer. Pt demonstrates a significant decline in functional status due to deficits listed below and would benefit from rehab at [facility]. Acute OT to follow =. ?   ? ?Recommendations for follow up therapy are one component of a multi-disciplinary discharge planning process, led by the attending physician.  Recommendations may be updated based on patient status, additional functional criteria and insurance authorization.  ? ?Follow Up Recommendations ? Skilled nursing-short term rehab (<3 hours/day)  ?  ?Assistance Recommended at Discharge Frequent or constant Supervision/Assistance  ?Patient can return home with the following Two people to help with walking and/or transfers;Two people to help with bathing/dressing/bathroom;Assistance with feeding;Assistance with cooking/housework;Direct supervision/assist for medications management;Direct supervision/assist for financial management;Assist for transportation;Help with stairs or ramp for entrance ? ?  ?Functional Status Assessment ? Patient has had a recent decline in their functional status and demonstrates the ability to make significant improvements in function in a reasonable and predictable amount of time.  ?Equipment Recommendations ? None recommended by OT  ?  ?Recommendations for Other Services   ? ? ?  ?Precautions / Restrictions Precautions ?Precautions: Fall;Cervical ?Precaution  Booklet Issued: No ?Required Braces or Orthoses: Cervical Brace ?Cervical Brace: Hard collar (at all times)  ? ?  ? ?Mobility Bed Mobility ?Overal bed mobility: Needs Assistance ?Bed Mobility: Supine to Sit ?  ?  ?Supine to sit: Max assist, HOB elevated ?  ?  ?  ?  ? ?Transfers ?Overall transfer level: Needs assistance ?Equipment used: 2 person hand held assist ?Transfers: Sit to/from Stand ?Sit to Stand: Max assist, +2 physical assistance ?  ?  ?  ?  ?  ?  ?  ? ?  ?Balance Overall balance assessment: History of Falls, Needs assistance ?  ?Sitting balance-Leahy Scale: Fair ?  ?  ?  ?Standing balance-Leahy Scale: Poor ?  ?  ?  ?  ?  ?  ?  ?  ?  ?  ?  ?  ?   ? ?ADL either performed or assessed with clinical judgement  ? ?ADL Overall ADL's : Needs assistance/impaired ?Eating/Feeding: Set up ?Eating/Feeding Details (indicate cue type and reason): poor PO intake; concnerned she is going to choke ?Grooming: Moderate assistance ?  ?Upper Body Bathing: Maximal assistance ?  ?Lower Body Bathing: Total assistance ?  ?Upper Body Dressing : Maximal assistance ?  ?Lower Body Dressing: Total assistance ?  ?Toilet Transfer: Maximal assistance;+2 for safety/equipment;+2 for physical assistance ?  ?Toileting- Clothing Manipulation and Hygiene: Total assistance ?  ?  ?  ?Functional mobility during ADLs: Maximal assistance;+2 for physical assistance (+2 HHA) ?   ? ? ? ?Vision Baseline Vision/History: 1 Wears glasses ?   ?   ?Perception   ?  ?Praxis   ?  ? ?Pertinent Vitals/Pain Pain Assessment ?Pain Assessment: 0-10 ?Pain Score: 10-Worst pain ever ?Pain Location: neck, R leg, butt ?Pain Descriptors / Indicators: Aching, Discomfort, Grimacing, Guarding,  Moaning ?Pain Intervention(s): Limited activity within patient's tolerance, RN gave pain meds during session, Repositioned  ? ? ? ?Hand Dominance Right ?  ?Extremity/Trunk Assessment Upper Extremity Assessment ?Upper Extremity Assessment: Generalized weakness ?  ?Lower Extremity  Assessment ?Lower Extremity Assessment: Defer to PT evaluation ?  ?Cervical / Trunk Assessment ?Cervical / Trunk Assessment: Kyphotic;Other exceptions (neck fx) ?  ?Communication Communication ?Communication: HOH ?  ?Cognition Arousal/Alertness: Awake/alert ?Behavior During Therapy: Anxious ?Overall Cognitive Status: No family/caregiver present to determine baseline cognitive functioning ?  ?  ?  ?  ?  ?  ?  ?  ?  ?  ?  ?  ?  ?  ?  ?  ?General Comments: A & O x 4; able to give detailed hx; tangential at times; repeating herself at times; most likely baseline ?  ?  ?General Comments  toe nail on R 4th toe cutting into middle toe - mepalex applied; nsg made awre ? ?  ?Exercises   ?  ?Shoulder Instructions    ? ? ?Home Living Family/patient expects to be discharged to:: Private residence ?Living Arrangements: Alone ?Available Help at Discharge: Family;Available PRN/intermittently ?Type of Home: House ?Home Access: Stairs to enter ?Entrance Stairs-Number of Steps: 4 ?Entrance Stairs-Rails: Right ?Home Layout: One level ?  ?  ?Bathroom Shower/Tub: Tub/shower unit ?  ?Bathroom Toilet: Standard ?Bathroom Accessibility: Yes ?How Accessible: Accessible via walker ?Home Equipment: Rollator (4 wheels);Cane - single [place]ista (2 wheels);BSC/3in1 (doesn't remeber if it has 2 wheels or 4 wheels) ?  ?  ?  ? ?  ?Prior Functioning/Environment Prior Level of Function : Independent/Modified Independent;Driving;History of Falls (last six months) ?  ?  ?  ?  ?  ?  ?Mobility Comments: used a cane outside/in community; furniture walks in the home ?  ?  ? ?  ?  ?OT Problem List: Decreased strength;Decreased range of motion;Decreased activity tolerance;Impaired balance (sitting and/or standing);Decreased cognition;Decreased safety awareness;Decreased knowledge of use of DME or AE;Impaired sensation;Impaired UE functional use;Pain ?  ?   ?OT Treatment/Interventions: Self-care/ADL training;Therapeutic exercise;Energy  conservation;DME and/or AE instruction;Neuromuscular education;Therapeutic activities;Cognitive remediation/compensation;Patient/family education;Balance training  ?  ?OT Goals(Current goals can be found in the care plan section) Acute Rehab OT Goals ?Patient Stated Goal: to get some rehab ?OT Goal Formulation: With patient ?Time For Goal Achievement: 02/05/22 ?Potential to Achieve Goals: Good  ?OT Frequency: Min 2X/week ?  ? ?Co-evaluation PT/OT/SLP Co-Evaluation/Treatment: Yes (partial session) ?  ?  ?OT goals addressed during session: ADL's and self-care ?  ? ?  ?AM-PAC OT "6 Clicks" Daily Activity     ?Outcome Measure Help from another person eating meals?: A Little ?Help from another person taking care of personal grooming?: A Lot ?Help from another person toileting, which includes using toliet, bedpan, or urinal?: Total ?Help from another person bathing (including washing, rinsing, drying)?: A Lot ?Help from another person to put on and taking off regular upper body clothing?: A Lot ?Help from another person to put on and taking off regular lower body clothing?: Total ?6 Click Score: 11 ?  ?End of Session Equipment Utilized During Treatment: Gait belt;Cervical collar;Oxygen (2L) ?Nurse Communication: Mobility status;Precautions;Weight bearing status ? ?Activity Tolerance: Patient tolerated treatment well ?Patient left: in chair;with call bell/phone within reach;with chair alarm set ? ?OT Visit Diagnosis: Unsteadiness on feet (R26.81);Other abnormalities of gait and mobility (R26.89);Muscle weakness (generalized) (M62.81);History of falling (Z91.81);Other symptoms and signs involving cognitive function;Pain ?Pain - Right/Left: Right ?Pain - part of body: Knee (neck;  butt)  ?              ?Time: 8984-2103 ?OT Time Calculation (min): 45 min ?Charges:  OT General Charges ?$OT Visit: 1 Visit ?OT Evaluation ?$OT Eval Moderate Complexity: 1 Mod ?OT Treatments ?$Self Care/Home Management : 8-22 mins ? ?[facility],  OT/L  ? ?Acute OT Clinical Specialist ?Acute Rehabilitation Services ?Pager [phone] ?Office [phone]  ? ?Lalanya Rufener,HILLARY ?01/22/2022, 10:09 AM ?

## 2022-01-22 NOTE — Evaluation (Signed)
Physical Therapy Evaluation ?Patient Details ?Name: Natalie West ?MRN: [id number] ?DOB: 09/22/1931 ?Today's Date: 01/22/2022 ? ?History of Present Illness ? 88 yo presenting to [facility] on 3/22 who fell and sustained a R patella fx and odontoid fx and B C1 lamina fxs. PMH: recurrent falls, GERD, Breast CA s/p lumpectomy chemo/rads in remission, HTN, HLD, a.fib (not anticoagulated), CHF, and depression.  ?Clinical Impression ? Pt presents to PT with deficits in functional mobility, balance, strength, power, endurance, gait, sensation, and with pain. Pt currently requires significant physical assistance to perform all aspects of functional mobility, with neck and R knee pain limiting at times. Pt will benefit from continued acute PT services in an effort to improve activity tolerance and reduce caregiver burden. Pt is unable to identify any consistent caregiver support at home at this time, as she typically assist her son. PT recommends SNF admission.   ?   ? ?Recommendations for follow up therapy are one component of a multi-disciplinary discharge planning process, led by the attending physician.  Recommendations may be updated based on patient status, additional functional criteria and insurance authorization. ? ?Follow Up Recommendations Skilled nursing-short term rehab (<3 hours/day) ? ?  ?Assistance Recommended at Discharge Frequent or constant Supervision/Assistance  ?Patient can return home with the following ? Two people to help with walking and/or transfers;Two people to help with bathing/dressing/bathroom;Assistance with cooking/housework;Direct supervision/assist for medications management;Direct supervision/assist for financial management;Assist for transportation;Help with stairs or ramp for entrance ? ?  ?Equipment Recommendations Wheelchair (measurements PT);Wheelchair cushion (measurements PT);BSC/3in1;Hospital bed  ?Recommendations for Other Services ?    ?  ?Functional Status Assessment Patient has had a  recent decline in their functional status and demonstrates the ability to make significant improvements in function in a reasonable and predictable amount of time.  ? ?  ?Precautions / Restrictions Precautions ?Precautions: Fall;Cervical ?Precaution Booklet Issued: No ?Required Braces or Orthoses: Cervical Brace ?Cervical Brace: Hard collar;At all times ?Restrictions ?Weight Bearing Restrictions: Yes ?RLE Weight Bearing: Weight bearing as tolerated (when in knee immobilizer)  ? ?  ? ?Mobility ? Bed Mobility ?  ?  ?  ?  ?  ?  ?  ?  ?  ? ?Transfers ?Overall transfer level: Needs assistance ?Equipment used: 2 person hand held assist ?Transfers: Sit to/from Stand, Bed to chair/wheelchair/BSC ?Sit to Stand: Max assist, +2 physical assistance ?  ?Step pivot transfers: Max assist, +2 physical assistance ?  ?  ?  ?General transfer comment: physical assistance to facilitate stepping during transfer. ?  ? ?Ambulation/Gait ?  ?  ?  ?  ?  ?  ?  ?  ? ?Stairs ?  ?  ?  ?  ?  ? ?Wheelchair Mobility ?  ? ?Modified Rankin (Stroke Patients Only) ?  ? ?  ? ?Balance Overall balance assessment: Needs assistance ?Sitting-balance support: No upper extremity supported, Feet supported ?Sitting balance-Leahy Scale: Fair ?  ?  ?Standing balance support: Bilateral upper extremity supported ?Standing balance-Leahy Scale: Poor ?Standing balance comment: maxA x2 ?  ?  ?  ?  ?  ?  ?  ?  ?  ?  ?  ?   ? ? ? ?Pertinent Vitals/Pain Pain Assessment ?Pain Assessment: 0-10 ?Pain Score: 10-Worst pain ever ?Pain Location: neck, R leg, buttocks ?Pain Descriptors / Indicators: Aching, Discomfort, Grimacing, Guarding, Moaning ?Pain Intervention(s): Limited activity within patient's tolerance  ? ? ?Home Living Family/patient expects to be discharged to:: Private residence ?Living Arrangements: Alone ?Available Help at Discharge:  Family;Available PRN/intermittently ?Type of Home: House ?Home Access: Stairs to enter ?Entrance Stairs-Rails: Right ?Entrance  Stairs-Number of Steps: 4 ?  ?Home Layout: One level ?Home Equipment: Cane - single point;BSC/3in1 (pt owns a walker, unable to recall if it is a RW or 4 wheeled walker) ?   ?  ?Prior Function Prior Level of Function : Independent/Modified Independent;Driving;History of Falls (last six months) ?  ?  ?  ?  ?  ?  ?Mobility Comments: used a cane outside/in community; furniture walks in the home. History of frequent falls ?  ?  ? ? ?Hand Dominance  ? Dominant Hand: Right ? ?  ?Extremity/Trunk Assessment  ? Upper Extremity Assessment ?Upper Extremity Assessment: Generalized weakness ?  ? ?Lower Extremity Assessment ?Lower Extremity Assessment: Generalized weakness;RLE deficits/detail;LLE deficits/detail ?RLE Sensation: history of peripheral neuropathy ?LLE Sensation: history of peripheral neuropathy ?  ? ?Cervical / Trunk Assessment ?Cervical / Trunk Assessment: Kyphotic;Other exceptions (hard collar, C1 fx)  ?Communication  ? Communication: HOH  ?Cognition Arousal/Alertness: Awake/alert ?Behavior During Therapy: Anxious ?Overall Cognitive Status: No family/caregiver present to determine baseline cognitive functioning ?  ?  ?  ?  ?  ?  ?  ?  ?  ?  ?  ?  ?  ?  ?  ?  ?General Comments: alert and oriented, pt follows commands well ?  ?  ? ?  ?General Comments General comments (skin integrity, edema, etc.): divergent toe nail of 4th toe on R foot digging into skin on 3rd toe, Dressing applie by OT to reduce risk for pressure injury ? ?  ?Exercises    ? ?Assessment/Plan  ?  ?PT Assessment Patient needs continued PT services  ?PT Problem List Decreased strength;Decreased activity tolerance;Decreased balance;Decreased mobility;Decreased cognition;Decreased safety awareness;Decreased knowledge of precautions;Pain;Impaired sensation ? ?   ?  ?PT Treatment Interventions DME instruction;Gait training;Functional mobility training;Therapeutic activities;Therapeutic exercise;Balance training;Neuromuscular re-education;Patient/family  education;Cognitive remediation   ? ?PT Goals (Current goals can be found in the Care Plan section)  ?Acute Rehab PT Goals ?Patient Stated Goal: to reduce pain ?PT Goal Formulation: With patient ?Time For Goal Achievement: 02/05/22 ?Potential to Achieve Goals: Fair ? ?  ?Frequency Min 3X/week ?  ? ? ?Co-evaluation PT/OT/SLP Co-Evaluation/Treatment: Yes ?Reason for Co-Treatment: Complexity of the patient's impairments (multi-system involvement);For patient/therapist safety;Necessary to address cognition/behavior during functional activity;To address functional/ADL transfers ?PT goals addressed during session: Mobility/safety with mobility;Balance;Strengthening/ROM ?OT goals addressed during session: ADL's and self-care ?  ? ? ?  ?AM-PAC PT "6 Clicks" Mobility  ?Outcome Measure Help needed turning from your back to your side while in a flat bed without using bedrails?: A Lot ?Help needed moving from lying on your back to sitting on the side of a flat bed without using bedrails?: A Lot ?Help needed moving to and from a bed to a chair (including a wheelchair)?: Total ?Help needed standing up from a chair using your arms (e.g., wheelchair or bedside chair)?: Total ?Help needed to walk in hospital room?: Total ?Help needed climbing 3-5 steps with a railing? : Total ?6 Click Score: 8 ? ?  ?End of Session Equipment Utilized During Treatment: Right knee immobilizer ?Activity Tolerance: Patient tolerated treatment well ?Patient left: in chair;with call bell/phone within reach;with chair alarm set ?Nurse Communication: Mobility status ?PT Visit Diagnosis: Other abnormalities of gait and mobility (R26.89);Muscle weakness (generalized) (M62.81);Pain ?Pain - Right/Left:  (neck, R knee) ?Pain - part of body: Knee ?  ? ?Time: 0488-8916 ?PT Time Calculation (min) (ACUTE ONLY): 25  min ? ? ?Charges:   PT Evaluation ?$PT Eval Low Complexity: 1 Low ?  ?  ?   ? ? ?Zenaida Niece, PT, DPT ?Acute Rehabilitation ?Pager: [phone] ?Office  [phone] ? ? ?Zenaida Niece ?01/22/2022, 10:22 AM ? ?

## 2022-01-22 NOTE — Progress Notes (Signed)
Patient stable.  She complains of neck pain and knee pain.  No radiating pain numbness or weakness.  Patient's collar is in place. ? ?Status post C1 and C2 fractures which should heal with immobilization collar.  Patient may be mobilized as tolerated.  Will likely need additional assistance prior to going home. ?

## 2022-01-23 ENCOUNTER — Encounter: Payer: Medicare HMO | Admitting: Adult Health

## 2022-01-23 DIAGNOSIS — M899 Disorder of bone, unspecified: Secondary | ICD-10-CM | POA: Diagnosis not present

## 2022-01-23 DIAGNOSIS — S12031A Nondisplaced posterior arch fracture of first cervical vertebra, initial encounter for closed fracture: Secondary | ICD-10-CM | POA: Diagnosis not present

## 2022-01-23 LAB — CBC
HCT: 32.3 % — ABNORMAL LOW (ref 36.0–46.0)
Hemoglobin: 10.3 g/dL — ABNORMAL LOW (ref 12.0–15.0)
MCH: 32.2 pg (ref 26.0–34.0)
MCHC: 31.9 g/dL (ref 30.0–36.0)
MCV: 100.9 fL — ABNORMAL HIGH (ref 80.0–100.0)
Platelets: 140 10*3/uL — ABNORMAL LOW (ref 150–400)
RBC: 3.2 MIL/uL — ABNORMAL LOW (ref 3.87–5.11)
RDW: 13.4 % (ref 11.5–15.5)
WBC: 10.2 10*3/uL (ref 4.0–10.5)
nRBC: 0 % (ref 0.0–0.2)

## 2022-01-23 LAB — BASIC METABOLIC PANEL
Anion gap: 5 (ref 5–15)
BUN: 16 mg/dL (ref 8–23)
CO2: 27 mmol/L (ref 22–32)
Calcium: 8.4 mg/dL — ABNORMAL LOW (ref 8.9–10.3)
Chloride: 105 mmol/L (ref 98–111)
Creatinine, Ser: 0.91 mg/dL (ref 0.44–1.00)
GFR, Estimated: >60 mL/min (ref >60)
Glucose, Bld: 103 mg/dL — ABNORMAL HIGH (ref 70–99)
Potassium: 4.1 mmol/L (ref 3.5–5.1)
Sodium: 137 mmol/L (ref 135–145)

## 2022-01-23 MED ORDER — BETHANECHOL CHLORIDE 25 MG PO TABS
25.0000 mg | ORAL_TABLET | Freq: Three times a day (TID) | ORAL | Status: DC
  Administered 2022-01-23 – 2022-01-28 (×16): 25 mg via ORAL
  Filled 2022-01-23 (×16): qty 1

## 2022-01-23 NOTE — Progress Notes (Addendum)
0355 Pt. Bladder scanned: 481. RN paged trauma on call. Awaiting direction  ?  ?Verbal order from Gurney Maxin, MD to in and out cath pt. ? ?[phone] RN in and out cath pt: 650 mL output  ?with bladder scan residual of 0 mL  ?

## 2022-01-23 NOTE — Progress Notes (Signed)
This RN noted 100 ml of urine in dedicated suction container.  ?Bladder scan revealed 125 ml. ?

## 2022-01-23 NOTE — Progress Notes (Signed)
Pt refused to take scheduled Tylenol. Pt educated on importance of Tylenol to assist in pain management pt continues to refuse.  ?

## 2022-01-23 NOTE — TOC Progression Note (Addendum)
Transition of Care (TOC) - Progression Note  ? ? ?Patient Details  ?Name: Natalie West ?MRN: [id number] ?Date of Birth: 04-16-1931 ? ?Transition of Care (TOC) CM/SW Contact  ?Ella Bodo, RN ?Phone Number: ?01/23/2022, 12:38 PM ? ?Clinical Narrative:    ?Bed offers available; emailed to patient's Murlean Caller.  He states he will review and call me back with decision.   ? ?Addendum: 4:11pm ?Patient has two new bed offers; called nephew to make him aware of them.  He states he and his wife are currently working on getting home health services in place for patient's son, as he is disabled and home alone.  Apparently, patient has been his caregiver for some time.  He states they have not had time to look at bed offers.  Informed nephew that patient is reaching medical stability and we will need a decision soon. He verbalized understanding of this; will have weekend CSW follow up with nephew regarding SNF bed choice.  Nephew agreeable to this plan.  ? ? ?Expected Discharge Plan: [place] ?Barriers to Discharge: Continued Medical Work up ? ?Expected Discharge Plan and Services ?Expected Discharge Plan: [place] ?  ?Discharge Planning Services: CM Consult ?Post Acute Care Choice: [place] ?Living arrangements for the past 2 months: [place] ?                ?  ?  ?  ?  ?  ?  ?  ?  ?  ?  ? ? ?Social Determinants of Health (SDOH) Interventions ?  ? ?Readmission Risk Interventions ?   ? View : No data to display.  ?  ?  ?  ? ?Reinaldo Raddle, RN, BSN  ?Trauma/Neuro ICU Case Manager ?[phone] ? ?

## 2022-01-23 NOTE — Progress Notes (Signed)
? ?  Providing Compassionate, Quality Care - Together ? ? ?Subjective: ?Patient reports neck and right knee pain. Denies numbness, tingling, or weakness of her extremities. ? ?Objective: ?Vital signs in last 24 hours: ?Temp:  [97.6 ?F (36.4 ?C)-98.7 ?F (37.1 ?C)] 97.8 ?F (36.6 ?C) (03/24 2778) ?Pulse Rate:  [59-72] 70 (03/24 0727) ?Resp:  [15-23] 19 (03/24 0727) ?BP: (91-107)/(55-70) 95/55 (03/24 0727) ?SpO2:  [93 %-98 %] 98 % (03/24 0727) ? ?Intake/Output from previous day: ?03/23 0701 - 03/24 0700 ?In: 650 [P.O.:650] ?Out: 1050 [Urine:1050] ?Intake/Output this shift: ?No intake/output data recorded. ? ?Patient is alert and oriented ?Speech clear ?Moving all extremities. RLE limited by pain ?Hard c-collar in place ? ? ?Lab Results: ?Recent Labs  ?  01/22/22 ?0410 01/23/22 ?0306  ?WBC 12.4* 10.2  ?HGB 10.9* 10.3*  ?HCT 33.8* 32.3*  ?PLT 160 140*  ? ?BMET ?Recent Labs  ?  01/22/22 ?0410 01/23/22 ?0306  ?NA 136 137  ?K 4.0 4.1  ?CL 106 105  ?CO2 27 27  ?GLUCOSE 126* 103*  ?BUN 14 16  ?CREATININE 0.75 0.91  ?CALCIUM 8.4* 8.4*  ? ? ?Studies/Results: ?No results found. ? ?Assessment/Plan: ?Patient suffered a fall at home where she sustained a type III odontoid fracture and bilateral lamina fractures. She also sustained a right patellar fracture. No surgical intervention recommend. Plan to keep her c-spine immobilized in hard collar for the C1 and 2 fractures to heal. ? ? LOS: 2 days  ? ?-Patient to discharge to SNF ?-Keep c-spine immobilized in collar at all times. ? ? ?Viona Gilmore, DNP, AGNP-C ?Nurse Practitioner ? ?[facility] ?[address], [place], [place] [address] ?P: [phone]    F: [phone] ? ?01/23/2022, 10:19 AM ? ? ? ? ?

## 2022-01-23 NOTE — Progress Notes (Signed)
Physical Therapy Treatment ?Patient Details ?Name: Natalie West ?MRN: [id number] ?DOB: 04-Feb-1931 ?Today's Date: 01/23/2022 ? ? ?History of Present Illness 88 yo presenting to [facility] on 3/22 who fell and sustained a R patella fx and odontoid fx and B C1 lamina fxs. PMH: recurrent falls, GERD, Breast CA s/p lumpectomy chemo/rads in remission, HTN, HLD, a.fib (not anticoagulated), CHF, and depression. ? ?  ?PT Comments  ? ? Pt continues to require physical assistance for all functional mobility,  hesitant to mobilize due to pain. Pt demonstrates BLE weakness with limited AROM of the unaffected limb during session. PT provides LE HEP to aide in improving strength and ROM. Pt will benefit from aggressive mobilization in an effort to reduce falls risk. PT continues to recommend SNF.  ?Recommendations for follow up therapy are one component of a multi-disciplinary discharge planning process, led by the attending physician.  Recommendations may be updated based on patient status, additional functional criteria and insurance authorization. ? ?Follow Up Recommendations ? Skilled nursing-short term rehab (<3 hours/day) ?  ?  ?Assistance Recommended at Discharge Frequent or constant Supervision/Assistance  ?Patient can return home with the following Two people to help with walking and/or transfers;Two people to help with bathing/dressing/bathroom;Assistance with cooking/housework;Direct supervision/assist for medications management;Direct supervision/assist for financial management;Assist for transportation;Help with stairs or ramp for entrance ?  ?Equipment Recommendations ? Wheelchair (measurements PT);Wheelchair cushion (measurements PT);BSC/3in1;Hospital bed  ?  ?Recommendations for Other Services   ? ? ?  ?Precautions / Restrictions Precautions ?Precautions: Fall;Cervical ?Precaution Booklet Issued: No ?Precaution Comments: instruct pt in log roll technique ?Required Braces or Orthoses: Cervical Brace ?Cervical Brace: Hard  collar;At all times ?Restrictions ?Weight Bearing Restrictions: Yes ?RLE Weight Bearing: Weight bearing as tolerated ?Other Position/Activity Restrictions: WBAT in knee immobilizer  ?  ? ?Mobility ? Bed Mobility ?Overal bed mobility: Needs Assistance ?Bed Mobility: Rolling, Sidelying to Sit ?Rolling: Max assist ?Sidelying to sit: Mod assist ?  ?  ?  ?General bed mobility comments: assist with bed pad to roll, assist to move LEs off bed, pt pushes off rail to elevate trunk from sidelying with PT assist ?  ? ?Transfers ?Overall transfer level: Needs assistance ?Equipment used: 1 person hand held assist ?Transfers: Sit to/from Stand, Bed to chair/wheelchair/BSC ?Sit to Stand: Max assist ?  ?Step pivot transfers: Max assist ?  ?  ?  ?  ?  ? ?Ambulation/Gait ?  ?  ?  ?  ?  ?  ?  ?  ? ? ?Stairs ?  ?  ?  ?  ?  ? ? ?Wheelchair Mobility ?  ? ?Modified Rankin (Stroke Patients Only) ?  ? ? ?  ?Balance Overall balance assessment: Needs assistance ?Sitting-balance support: No upper extremity supported, Feet supported ?Sitting balance-Leahy Scale: Fair ?  ?  ?Standing balance support: Bilateral upper extremity supported, Reliant on assistive device for balance ?Standing balance-Leahy Scale: Poor ?Standing balance comment: maxA ?  ?  ?  ?  ?  ?  ?  ?  ?  ?  ?  ?  ? ?  ?Cognition Arousal/Alertness: Awake/alert ?Behavior During Therapy: [facility] for tasks assessed/performed ?Overall Cognitive Status: Impaired/Different from baseline ?Area of Impairment: Memory ?  ?  ?  ?  ?  ?  ?  ?  ?  ?  ?Memory: Decreased short-term memory ?  ?  ?  ?  ?General Comments: anticipate pt is close to baseline ?  ?  ? ?  ?Exercises General Exercises - Lower  Extremity ?Ankle Circles/Pumps: AROM, Both, 15 reps ?Quad Sets: AROM, Left, 10 reps ?Gluteal Sets: AROM, Both, 10 reps ?Heel Slides: AAROM, Left, 5 reps ?Hip ABduction/ADduction: AAROM, Both, 10 reps ? ?  ?General Comments General comments (skin integrity, edema, etc.): pt on 1L [place] upon PT arrival,  weaned to room air with stable sats in mid 90s. Pt requests return to 1L [place] at end of session for comfort ?  ?  ? ?Pertinent Vitals/Pain Pain Assessment ?Pain Assessment: 0-10 ?Pain Score: 8  ?Pain Location: neck and R knee cap ?Pain Descriptors / Indicators: Aching ?Pain Intervention(s): Monitored during session  ? ? ?Home Living   ?  ?  ?  ?  ?  ?  ?  ?  ?  ?   ?  ?Prior Function    ?  ?  ?   ? ?PT Goals (current goals can now be found in the care plan section) Acute Rehab PT Goals ?Patient Stated Goal: to reduce pain ?Progress towards PT goals: Progressing toward goals (slowly) ? ?  ?Frequency ? ? ? Min 3X/week ? ? ? ?  ?PT Plan Current plan remains appropriate  ? ? ?Co-evaluation   ?  ?  ?  ?  ? ?  ?AM-PAC PT "6 Clicks" Mobility   ?Outcome Measure ? Help needed turning from your back to your side while in a flat bed without using bedrails?: A Lot ?Help needed moving from lying on your back to sitting on the side of a flat bed without using bedrails?: A Lot ?Help needed moving to and from a bed to a chair (including a wheelchair)?: A Lot ?Help needed standing up from a chair using your arms (e.g., wheelchair or bedside chair)?: A Lot ?Help needed to walk in hospital room?: Total ?Help needed climbing 3-5 steps with a railing? : Total ?6 Click Score: 10 ? ?  ?End of Session Equipment Utilized During Treatment: Oxygen ?Activity Tolerance: Patient limited by pain ?Patient left: in chair;with call bell/phone within reach;with chair alarm set ?Nurse Communication: Mobility status ?PT Visit Diagnosis: Other abnormalities of gait and mobility (R26.89);Muscle weakness (generalized) (M62.81);Pain ?Pain - Right/Left: Right ?Pain - part of body: Knee ?  ? ? ?Time: 1030-1055 ?PT Time Calculation (min) (ACUTE ONLY): 25 min ? ?Charges:  $Therapeutic Exercise: 8-22 mins ?$Therapeutic Activity: 8-22 mins          ?          ? ?Zenaida Niece, PT, DPT ?Acute Rehabilitation ?Pager: [phone] ?Office [phone] ? ? ? ?Zenaida Niece ?01/23/2022, 12:45 PM ? ?

## 2022-01-23 NOTE — Progress Notes (Addendum)
Patient ID: Natalie West, female   DOB: [birth year], 88 y.o.   MRN: [id number] ?   ?  ?Subjective: ?Required I&O earlier this AM. Wants to eat breakfast. ?ROS negative except as listed above. ?Objective: ?Vital signs in last 24 hours: ?Temp:  [97.6 ?F (36.4 ?C)-98.7 ?F (37.1 ?C)] 97.8 ?F (36.6 ?C) (03/24 6578) ?Pulse Rate:  [59-72] 70 (03/24 0727) ?Resp:  [15-23] 19 (03/24 0727) ?BP: (91-107)/(55-70) 95/55 (03/24 0727) ?SpO2:  [93 %-98 %] 98 % (03/24 0727) ?Last BM Date : 01/21/22 ? ?Intake/Output from previous day: ?03/23 0701 - 03/24 0700 ?In: 650 [P.O.:650] ?Out: 1050 [Urine:1050] ?Intake/Output this shift: ?No intake/output data recorded. ? ?General appearance: alert and cooperative ?Neck: collar ?Resp: clear to auscultation bilaterally ?Cardio: regular rate and rhythm ?GI: soft, NT ?Extremities: calves soft ? ?Lab Results: ?CBC  ?Recent Labs  ?  01/22/22 ?0410 01/23/22 ?0306  ?WBC 12.4* 10.2  ?HGB 10.9* 10.3*  ?HCT 33.8* 32.3*  ?PLT 160 140*  ? ?BMET ?Recent Labs  ?  01/22/22 ?0410 01/23/22 ?0306  ?NA 136 137  ?K 4.0 4.1  ?CL 106 105  ?CO2 27 27  ?GLUCOSE 126* 103*  ?BUN 14 16  ?CREATININE 0.75 0.91  ?CALCIUM 8.4* 8.4*  ? ?PT/INR ?Recent Labs  ?  01/21/22 ?0400  ?LABPROT 14.3  ?INR 1.1  ? ?ABG ?No results for input(s): PHART, HCO3 in the last 72 hours. ? ?Invalid input(s): PCO2, PO2 ? ?Studies/Results: ?No results found. ? ?Anti-infectives: ?Anti-infectives (From admission, onward)  ? ? None  ? ?  ? ? ?Assessment/Plan: ?88 y/o F s/p ground level fall, no LOC ?R comminuted, nondisplaced patella FX - non-op, continue KI per Dr. Mable Fill ?C1-C2 FX - NS c/s  Dr. Trenton Gammon; continue non-op mgmt with c-collar  ?Head contusion - without acute intracranial injury on CT head, monitor ?A.fib - followed by cardiology, currently on baby ASA and not anticoagulated due to history of dizziness,falls. Continue home atenolol; home '81mg'$  ASA ?Diastolic CHF - on lasix at home 20 mg, 40 mg alternating daily; lasix '20mg'$  daily for now ?Acute  urinary retention - start urecholine, I&O Q6h prn ?GERD - protonix ordered, on this at home as well ?Depression - on amitryptaline and perphenzine at home, re-ordered; unclear if perphenazine is for nausea vs antipsychotic use.  ?HLD- pravastatin  ?FEN - SOFT diet, saline lock IV. Encourage PO fluids. ?VTE - SCD's, Lovenox ?ID - none currently ? ?Dispo - therapies rec SNF, TOC working on this, DMEs also ordered ? ? LOS: 2 days  ? ? ?Georganna Skeans, MD, MPH, FACS ?Trauma & General Surgery ?Use [email or web address] to contact on call provider ? ?01/23/2022  ?

## 2022-01-24 NOTE — Progress Notes (Signed)
Patient ID: CAITLAIN TWEED, female   DOB: 08/12/1931, 88 y.o.   MRN: [id number] ?   ?  ?Subjective: ?Eating, no new complaints ?ROS negative except as listed above. ?Objective: ?Vital signs in last 24 hours: ?Temp:  [97.6 ?F (36.4 ?C)-98.5 ?F (36.9 ?C)] 98.2 ?F (36.8 ?C) (03/25 1113) ?Pulse Rate:  [60-93] 69 (03/25 1113) ?Resp:  [12-18] 14 (03/25 1113) ?BP: (94-110)/(52-71) 97/52 (03/25 1113) ?SpO2:  [90 %-99 %] 97 % (03/25 1113) ?Last BM Date : 01/21/22 ? ?Intake/Output from previous day: ?03/24 0701 - 03/25 0700 ?In: 500 [P.O.:500] ?Out: 700 [Urine:700] ?Intake/Output this shift: ?No intake/output data recorded. ? ?General appearance: cooperative ?Neck: collar ?Resp: clear to auscultation bilaterally ?Cardio: regular rate and rhythm ?GI: soft, non-tender; bowel sounds normal; no masses,  no organomegaly ?Extremities: RLE KI ? ?Lab Results: ?CBC  ?Recent Labs  ?  01/22/22 ?0410 01/23/22 ?0306  ?WBC 12.4* 10.2  ?HGB 10.9* 10.3*  ?HCT 33.8* 32.3*  ?PLT 160 140*  ? ?BMET ?Recent Labs  ?  01/22/22 ?0410 01/23/22 ?0306  ?NA 136 137  ?K 4.0 4.1  ?CL 106 105  ?CO2 27 27  ?GLUCOSE 126* 103*  ?BUN 14 16  ?CREATININE 0.75 0.91  ?CALCIUM 8.4* 8.4*  ? ?PT/INR ?No results for input(s): LABPROT, INR in the last 72 hours. ?ABG ?No results for input(s): PHART, HCO3 in the last 72 hours. ? ?Invalid input(s): PCO2, PO2 ? ?Studies/Results: ?No results found. ? ?Anti-infectives: ?Anti-infectives (From admission, onward)  ? ? None  ? ?  ? ? ?Assessment/Plan: ?88 y/o F s/p ground level fall, no LOC ?R comminuted, nondisplaced patella FX - non-op, continue KI per Dr. Mable Fill ?C1-C2 FX - NS c/s  Dr. Trenton Gammon; continue non-op mgmt with c-collar  ?Head contusion - without acute intracranial injury on CT head, monitor ?A.fib - followed by cardiology, currently on baby ASA and not anticoagulated due to history of dizziness,falls. Continue home atenolol; home '81mg'$  ASA ?Diastolic CHF - on lasix at home 20 mg, 40 mg alternating daily; lasix '20mg'$  daily for  now ?Acute urinary retention - start urecholine, I&O Q6h prn ?GERD - protonix ordered, on this at home as well ?Depression - on amitryptaline and perphenzine at home, re-ordered; unclear if perphenazine is for nausea vs antipsychotic use.  ?HLD- pravastatin  ?FEN - SOFT diet, saline lock IV. Encourage PO fluids. ?VTE - SCD's, Lovenox ?ID - none currently ? ?Dispo - therapies rec SNF, TOC has given bed offers to nephew to decide ? ? LOS: 3 days  ? ? ?Georganna Skeans, MD, MPH, FACS ?Trauma & General Surgery ?Use [email or web address] to contact on call provider ? ?01/24/2022  ?

## 2022-01-25 NOTE — Progress Notes (Signed)
Patient ID: Natalie West, female   DOB: 07-Jun-1931, 88 y.o.   MRN: [id number] ?   ?  ?Subjective: ?Per RN she had some confusion overnight but now she is alert and calm ?ROS negative except as listed above. ?Objective: ?Vital signs in last 24 hours: ?Temp:  [97.6 ?F (36.4 ?C)-98.2 ?F (36.8 ?C)] 97.6 ?F (36.4 ?C) (03/26 0741) ?Pulse Rate:  [64-78] 78 (03/26 0741) ?Resp:  [13-20] 20 (03/26 0741) ?BP: (93-111)/(52-66) 109/66 (03/26 0741) ?SpO2:  [95 %-100 %] 98 % (03/26 0741) ?Last BM Date : 01/21/22 ? ?Intake/Output from previous day: ?03/25 0701 - 03/26 0700 ?In: 480 [P.O.:480] ?Out: 650 [Urine:650] ?Intake/Output this shift: ?No intake/output data recorded. ? ?General appearance: alert and cooperative ?Resp: clear to auscultation bilaterally ?Cardio: regular rate and rhythm ?GI: soft, NT ?Neurologic: Mental status: alert, MAE ? ?Lab Results: ?CBC  ?Recent Labs  ?  01/23/22 ?0306  ?WBC 10.2  ?HGB 10.3*  ?HCT 32.3*  ?PLT 140*  ? ?BMET ?Recent Labs  ?  01/23/22 ?0306  ?NA 137  ?K 4.1  ?CL 105  ?CO2 27  ?GLUCOSE 103*  ?BUN 16  ?CREATININE 0.91  ?CALCIUM 8.4*  ? ?PT/INR ?No results for input(s): LABPROT, INR in the last 72 hours. ?ABG ?No results for input(s): PHART, HCO3 in the last 72 hours. ? ?Invalid input(s): PCO2, PO2 ? ?Studies/Results: ?No results found. ? ?Anti-infectives: ?Anti-infectives (From admission, onward)  ? ? None  ? ?  ? ? ?Assessment/Plan: ?88 y/o F s/p ground level fall, no LOC ?R comminuted, nondisplaced patella FX - non-op, continue KI per Dr. Mable Fill ?C1-C2 FX - NS c/s  Dr. Trenton Gammon; continue non-op mgmt with c-collar  ?Head contusion - without acute intracranial injury on CT head, monitor ?A.fib - followed by cardiology, currently on baby ASA and not anticoagulated due to history of dizziness,falls. Continue home atenolol; home '81mg'$  ASA ?Diastolic CHF - on lasix at home 20 mg, 40 mg alternating daily; lasix '20mg'$  daily for now ?Acute urinary retention - start urecholine, I&O Q6h prn ?GERD - protonix  ordered, on this at home as well ?Depression - on amitryptaline and perphenzine at home, re-ordered; unclear if perphenazine is for nausea vs antipsychotic use.  ?HLD- pravastatin  ?FEN - soft diet ?VTE - SCD's, Lovenox ?ID - none currently ? ?Dispo - therapies rec SNF, TOC has given bed offers to nephew to decide ? ? LOS: 4 days  ? ? ?Georganna Skeans, MD, MPH, FACS ?Trauma & General Surgery ?Use [email or web address] to contact on call provider ? ?01/25/2022  ?

## 2022-01-25 NOTE — Progress Notes (Signed)
Pt refused to be turned/repositioned or transferred to the chair educated pt on importance continued to refuse.  ?

## 2022-01-26 DIAGNOSIS — S12031A Nondisplaced posterior arch fracture of first cervical vertebra, initial encounter for closed fracture: Secondary | ICD-10-CM | POA: Diagnosis not present

## 2022-01-26 DIAGNOSIS — M899 Disorder of bone, unspecified: Secondary | ICD-10-CM | POA: Diagnosis not present

## 2022-01-26 LAB — RESP PANEL BY RT-PCR (FLU A&B, COVID) ARPGX2
Influenza A by PCR: NEGATIVE
Influenza B by PCR: NEGATIVE
SARS Coronavirus 2 by RT PCR: NEGATIVE

## 2022-01-26 MED ORDER — POLYETHYLENE GLYCOL 3350 17 G PO PACK
17.0000 g | PACK | Freq: Two times a day (BID) | ORAL | Status: DC
  Administered 2022-01-26 – 2022-01-28 (×5): 17 g via ORAL
  Filled 2022-01-26 (×5): qty 1

## 2022-01-26 NOTE — Progress Notes (Signed)
Physical Therapy Treatment ?Patient Details ?Name: Natalie West ?MRN: [id number] ?DOB: 1931/03/17 ?Today's Date: 01/26/2022 ? ? ?History of Present Illness 88 yo presenting to [facility] on 3/[facility] who fell and sustained a R patella fx and odontoid fx and B C1 lamina fxs. PMH: recurrent falls, GERD, Breast CA s/p lumpectomy chemo/rads in remission, HTN, HLD, a.fib (not anticoagulated), CHF, and depression. ? ?  ?PT Comments  ? ? Focus of session today was bed mobility and functional transfers. The patient tolerated well but was limited secondary to pain.  Pt. Shows overall improvement with her pain levels. Functional strength, mobility, balance, and cognition are still limiting function. Pt. Would benefit from skilled PT to continue to address her transfer ability, functional balance, endurance, and gait. Plan and discharge setting remains unchanged. Pt to follow acutely as appropriate.  ?   ?Recommendations for follow up therapy are one component of a multi-disciplinary discharge planning process, led by the attending physician.  Recommendations may be updated based on patient status, additional functional criteria and insurance authorization. ? ?Follow Up Recommendations ? Skilled nursing-short term rehab (<3 hours/day) ?  ?  ?Assistance Recommended at Discharge Frequent or constant Supervision/Assistance  ?Patient can return home with the following Two people to help with walking and/or transfers;Two people to help with bathing/dressing/bathroom;Assistance with cooking/housework;Direct supervision/assist for medications management;Direct supervision/assist for financial management;Assist for transportation;Help with stairs or ramp for entrance ?  ?Equipment Recommendations ? Wheelchair (measurements PT);Wheelchair cushion (measurements PT);BSC/3in1;Hospital bed  ?  ?Recommendations for Other Services   ? ? ?  ?Precautions / Restrictions Precautions ?Precautions: Fall;Cervical ?Precaution Booklet Issued: No ?Required Braces or  Orthoses: Cervical Brace ?Cervical Brace: Hard collar;At all times ?Restrictions ?Weight Bearing Restrictions: Yes ?RLE Weight Bearing: Weight bearing as tolerated ?Other Position/Activity Restrictions: WBAT in knee immobilizer  ?  ? ?Mobility ? Bed Mobility ?Overal bed mobility: Needs Assistance ?Bed Mobility: Supine to Sit ?  ?  ?Supine to sit: Max assist ?  ?  ?General bed mobility comments: Pt. was able to help with the movement of LE, but required A for all truncal movements and pivot. ?  ? ?Transfers ?Overall transfer level: Needs assistance ?Equipment used: 2 person hand held assist, Rolling walker (2 wheels) ?Transfers: Bed to chair/wheelchair/BSC, Sit to/from Stand ?Sit to Stand: Max assist ?  ?Step pivot transfers: Max assist ?  ?  ?  ?General transfer comment: Pt. able to perfrom STS with RW but complains of significant pain. Trasnfer switched to 2+hha, Pt. requires A for power up and pivot to chair and shows improved processing without using RW. ?  ? ?Ambulation/Gait ?  ?  ?  ?  ?  ?  ?  ?  ? ? ?Stairs ?  ?  ?  ?  ?  ? ? ?Wheelchair Mobility ?  ? ?Modified Rankin (Stroke Patients Only) ?  ? ? ?  ?Balance Overall balance assessment: Needs assistance ?Sitting-balance support: No upper extremity supported, Feet supported ?Sitting balance-Leahy Scale: Fair ?Sitting balance - Comments: Able to maintain sitting balance, required support originally to steady ?  ?Standing balance support: Bilateral upper extremity supported, Reliant on assistive device for balance ?Standing balance-Leahy Scale: Poor ?Standing balance comment: MaxA with 2+HHA ?  ?  ?  ?  ?  ?  ?  ?  ?  ?  ?  ?  ? ?  ?Cognition Arousal/Alertness: Awake/alert ?Behavior During Therapy: [facility] for tasks assessed/performed ?Overall Cognitive Status: Impaired/Different from baseline ?Area of Impairment: Memory, Orientation ?  ?  ?  ?  ?  ?  ?  ?  ?  Orientation Level: Place, Situation ?  ?Memory: Decreased short-term memory ?  ?  ?  ?  ?General Comments:  anticipate pt is close to baseline, pt. reports she has a difficult time remembering where she is, shows slowed processing and requires increased time to follow commands. ?  ?  ? ?  ?Exercises   ? ?  ?General Comments General comments (skin integrity, edema, etc.): VSS on RA, pt. requests someone to make sure she is brought back to bed, NT notified. ?  ?  ? ?Pertinent Vitals/Pain Pain Assessment ?Pain Assessment: Faces ?Faces Pain Scale: Hurts even more ?Pain Location: neck and R knee cap ?Pain Descriptors / Indicators: Aching, Guarding ?Pain Intervention(s): Limited activity within patient's tolerance, Monitored during session, Repositioned  ? ? ?Home Living   ?  ?  ?  ?  ?  ?  ?  ?  ?  ?   ?  ?Prior Function    ?  ?  ?   ? ?PT Goals (current goals can now be found in the care plan section) Acute Rehab PT Goals ?Patient Stated Goal: to reduce pain ?PT Goal Formulation: With patient ?Time For Goal Achievement: 02/05/22 ?Potential to Achieve Goals: Fair ?Progress towards PT goals: Progressing toward goals ? ?  ?Frequency ? ? ? Min 3X/week ? ? ? ?  ?PT Plan Current plan remains appropriate  ? ? ?Co-evaluation   ?  ?  ?  ?  ? ?  ?AM-PAC PT "6 Clicks" Mobility   ?Outcome Measure ? Help needed turning from your back to your side while in a flat bed without using bedrails?: A Lot ?Help needed moving from lying on your back to sitting on the side of a flat bed without using bedrails?: A Lot ?Help needed moving to and from a bed to a chair (including a wheelchair)?: A Lot ?Help needed standing up from a chair using your arms (e.g., wheelchair or bedside chair)?: A Lot ?Help needed to walk in hospital room?: Total ?Help needed climbing 3-5 steps with a railing? : Total ?6 Click Score: 10 ? ?  ?End of Session Equipment Utilized During Treatment: Oxygen ?Activity Tolerance: Patient limited by pain ?Patient left: in chair;with call bell/phone within reach;with chair alarm set ?Nurse Communication: Mobility status ?PT Visit  Diagnosis: Other abnormalities of gait and mobility (R26.89);Muscle weakness (generalized) (M62.81);Pain ?Pain - Right/Left: Right ?Pain - part of body: Knee (knee and c-spine) ?  ? ? ?Time: 1253-1310 ?PT Time Calculation (min) (ACUTE ONLY): 17 min ? ?Charges:  $Therapeutic Activity: 8-22 mins          ?          ? ?Thermon Leyland, SPT ?Acute Rehab Services ? ? ? ?Thermon Leyland ?01/26/2022, 2:07 PM ? ?

## 2022-01-26 NOTE — Care Management Important Message (Signed)
Important Message ? ?Patient Details  ?Name: Natalie West ?MRN: [id number] ?Date of Birth: Feb 26, 1931 ? ? ?Medicare Important Message Given:  Yes ? ? ? ? ?Levada Dy  Mosetta Ferdinand-Martin ?01/26/2022, 2:43 PM ?

## 2022-01-26 NOTE — TOC Progression Note (Addendum)
Transition of Care (TOC) - Progression Note  ? ? ?Patient Details  ?Name: Natalie West ?MRN: [id number] ?Date of Birth: Nov 15, 1930 ? ?Transition of Care (TOC) CM/SW Contact  ?Ella Bodo, RN ?Phone Number: ?01/26/2022, 10:12 AM ? ?Clinical Narrative:    ?Received telephone call from nephew's wife, Natalie West: She states they are interested in a facility in [place], [place], which is much closer to them. (They live in [place]). She states that they did not hear from [facility] over the weekend regarding selection of bed offers. They prefer [facility] in [place]; I spoke with Natalie West in admissions at facility.  Faxed referral to [facility] at [phone].  Natalie West made aware that family likely to be responsible for transportation to facility, as it is out of service area.  She still wishes to pursue; Natalie West in admissions to call back after reviewing with director.   ? ?Addendum 1145am ?Patient accepted for admission to [facility] in [place], [place] pending insurance auth and transportation arrangements. Will need Covid test prior to admission to SNF.   ? ?Addendum: 1220pm-2:30pm ?Insurance authorization has been received from [facility] for admission to [facility], [place] [id number].  Faxed demographics and insurance card to [facility] in admissions at facility.  Spoke with patient's nephew, Natalie West; he is aware that transportation is not covered by insurance, and will have to be paid upfront by family.  Best price available is [facility] at [phone], and nephew agreeable to this.  [facility] can transport patient on Wednesday, March 29, with 9:30-10:00am pickup.  Family given ambulance service number to call and make payment.  Covid test ordered; will fax discharge summary and MAR to facility on Wednesday prior to transport.  ? ? ?Expected Discharge Plan: [place] ?Barriers to Discharge: Continued Medical Work up ? ?Expected Discharge Plan and Services ?Expected Discharge Plan: [place] ?  ?Discharge  Planning Services: CM Consult ?Post Acute Care Choice: [place] ?Living arrangements for the past 2 months: [place] ?                ?  ?  ?  ?  ?  ?  ?  ?  ?  ?  ? ? ?Social Determinants of Health (SDOH) Interventions ?  ? ?Readmission Risk Interventions ?   ? View : No data to display.  ?  ?  ?  ? ?Reinaldo Raddle, RN, BSN  ?Trauma/Neuro ICU Case Manager ?[phone] ? ?

## 2022-01-26 NOTE — Progress Notes (Signed)
? ? ?   ?Subjective: ?CC: ?Patient complains of a HA. About to get tylenol from RN. On o2. Denies cp or sob. No other complaints. Tolerating diet without n/v. No bm yesterday. Does not think she got oob yesterday.  ? ?Objective: ?Vital signs in last 24 hours: ?Temp:  [97.6 ?F (36.4 ?C)-98.3 ?F (36.8 ?C)] 98.3 ?F (36.8 ?C) (03/27 0300) ?Pulse Rate:  [66-78] 66 (03/27 0300) ?Resp:  [15-21] 21 (03/27 0300) ?BP: (104-124)/(58-73) 111/73 (03/27 0300) ?SpO2:  [96 %-98 %] 98 % (03/27 0300) - on RA at 0700 ?Last BM Date : 01/21/22 ? ?Intake/Output from previous day: ?03/26 0701 - 03/27 0700 ?In: -  ?Out: 1000 [Urine:1000] ?Intake/Output this shift: ?No intake/output data recorded. ? ?PE: ?Gen:  Alert, NAD, pleasant ?HEENT: EOM's intact, pupils equal and round ?Neck: C-Collar in place ?Card:  Reg rate ?Pulm:  CTAB, no W/R/R, effort normal.  ?Abd: Soft, ND, NT +BS ?Ext:  RLE in [place] distally. MAE's.  ?Neuro: Non focal.  ?Psych: A&Ox3  ?Skin: no rashes noted, warm and dry ? ?Lab Results:  ?No results for input(s): WBC, HGB, HCT, PLT in the last 72 hours. ?BMET ?No results for input(s): NA, K, CL, CO2, GLUCOSE, BUN, CREATININE, CALCIUM in the last 72 hours. ?PT/INR ?No results for input(s): LABPROT, INR in the last 72 hours. ?CMP  ?   ?Component Value Date/Time  ? NA 137 01/23/2022 0306  ? NA 142 08/21/2021 1207  ? NA 142 06/28/2014 1400  ? K 4.1 01/23/2022 0306  ? K 3.9 06/28/2014 1400  ? CL 105 01/23/2022 0306  ? CL 109 (H) 03/31/2013 1410  ? CO2 27 01/23/2022 0306  ? CO2 25 06/28/2014 1400  ? GLUCOSE 103 (H) 01/23/2022 0306  ? GLUCOSE 92 06/28/2014 1400  ? GLUCOSE 81 03/31/2013 0917  ? BUN 16 01/23/2022 0306  ? BUN 20 08/21/2021 1207  ? BUN 16.0 06/28/2014 1400  ? CREATININE 0.91 01/23/2022 0306  ? CREATININE 0.82 01/16/2015 1450  ? CREATININE 0.8 06/28/2014 1400  ? CALCIUM 8.4 (L) 01/23/2022 0306  ? CALCIUM 9.1 06/28/2014 1400  ? PROT 5.9 (L) 01/22/2022 0410  ? PROT 6.4 05/07/2021 1410  ? PROT 6.6 06/28/2014 1400  ?  ALBUMIN 3.1 (L) 01/22/2022 0410  ? ALBUMIN 4.4 05/07/2021 1410  ? ALBUMIN 3.7 06/28/2014 1400  ? AST 18 01/22/2022 0410  ? AST 25 06/28/2014 1400  ? ALT 10 01/22/2022 0410  ? ALT 16 06/28/2014 1400  ? ALKPHOS 70 01/22/2022 0410  ? ALKPHOS 85 06/28/2014 1400  ? BILITOT 0.7 01/22/2022 0410  ? BILITOT 0.4 05/07/2021 1410  ? BILITOT 0.47 06/28/2014 1400  ? GFRNONAA >60 01/23/2022 0306  ? GFRAA 57 (L) 12/06/2019 1036  ? ?Lipase  ?No results found for: LIPASE ? ?Studies/Results: ?No results found. ? ?Anti-infectives: ?Anti-infectives (From admission, onward)  ? ? None  ? ?  ? ? ? ?Assessment/Plan ?88 y/o F s/p ground level fall, no LOC ?R comminuted, nondisplaced patella FX - non-op, continue WBAT in [place] per Dr. Mable Fill. F/u in 2 weeks  ?C1-C2 FX - NS c/s. Dr. Trenton Gammon; continue non-op mgmt with c-collar  ?Head contusion - without acute intracranial injury on CT head, monitor ?A.fib - followed by cardiology as outpatient. Currently on baby ASA and not anticoagulated due to history of dizziness & falls. Continue home atenolol; home '81mg'$  ASA ?Diastolic CHF - On lasix at home 20 mg, 40 mg alternating daily at baseline; lasix '20mg'$  daily for now. Cont  home supplemental K daily. IS.  ?Acute urinary retention - Urecholine, foley out and voiding.  ?GERD - home protonix  ?Depression - on amitryptaline and perphenzine at home, re-ordered; unclear if perphenazine is for nausea vs antipsychotic use.  ?HLD - pravastatin  ?HTN - home and prn meds ?FEN - soft diet, sliv, increase bowel regimen ?VTE - SCD's, Lovenox ?ID - none currently ?Dispo - therapies rec SNF, TOC has given bed offers to nephew. They would also like to explore options closer to [facility].  ? ?I reviewed nursing notes, consultant notes, last 24 h vitals and pain scores, last 48 h intake and output, last 24 h labs and trends ? ? LOS: 5 days  ? ? ?Jillyn Ledger , PA-C ?[facility] ?01/26/2022, 10:24 AM ?Please see Amion for pager number during day hours  7:00am-4:30pm ? ?

## 2022-01-27 DIAGNOSIS — S12031A Nondisplaced posterior arch fracture of first cervical vertebra, initial encounter for closed fracture: Secondary | ICD-10-CM | POA: Diagnosis not present

## 2022-01-27 DIAGNOSIS — M899 Disorder of bone, unspecified: Secondary | ICD-10-CM | POA: Diagnosis not present

## 2022-01-27 MED ORDER — BISACODYL 10 MG RE SUPP
10.0000 mg | Freq: Once | RECTAL | Status: AC
  Administered 2022-01-27: 10 mg via RECTAL
  Filled 2022-01-27: qty 1

## 2022-01-27 NOTE — Progress Notes (Signed)
? ? ?   ?Subjective: ?CC: Neck pain ?Complains of neck pain and c-collar being uncomfortable. No other complaints. Tolerating diet without n/v or abdominal pain. Had pot roast for dinner. Weaned to RA. BM x 1 documented yesterday however patient does not think she had a bm. Passing flatus. Voiding. Worked with PT yesterday - note reviewed.  ? ?Objective: ?Vital signs in last 24 hours: ?Temp:  [97.2 ?F (36.2 ?C)-98.4 ?F (36.9 ?C)] 97.4 ?F (36.3 ?C) (03/28 0813) ?Pulse Rate:  [64-80] 70 (03/28 0813) ?Resp:  [16-20] 18 (03/28 0813) ?BP: (114-129)/(60-77) 129/70 (03/28 0813) ?SpO2:  [94 %-98 %] 96 % (03/28 0813) ?Last BM Date : 01/21/22 ? ?Intake/Output from previous day: ?03/27 0701 - 03/28 0700 ?In: 240 [P.O.:240] ?Out: 937 [Urine:760; Stool:1] ?Intake/Output this shift: ?No intake/output data recorded. ? ?PE: ?Gen:  Alert, NAD, pleasant ?HEENT: EOM's intact, pupils equal and round. EOMI. Contusion R forehead.  ?Neck: C-Collar in place ?Card:  Reg rate ?Pulm:  CTAB, no W/R/R, effort normal.  ?Abd: Soft, ND, NT +BS ?Ext:  RLE in [place] distally. MAE's.  ?Neuro: Non focal. MAE's. SILT to BUE and BLE's.  ?Psych: A&Ox3  ?Skin: no rashes noted, warm and dry ? ?Lab Results:  ?No results for input(s): WBC, HGB, HCT, PLT in the last 72 hours. ?BMET ?No results for input(s): NA, K, CL, CO2, GLUCOSE, BUN, CREATININE, CALCIUM in the last 72 hours. ?PT/INR ?No results for input(s): LABPROT, INR in the last 72 hours. ?CMP  ?   ?Component Value Date/Time  ? NA 137 01/23/2022 0306  ? NA 142 08/21/2021 1207  ? NA 142 06/28/2014 1400  ? K 4.1 01/23/2022 0306  ? K 3.9 06/28/2014 1400  ? CL 105 01/23/2022 0306  ? CL 109 (H) 03/31/2013 3428  ? CO2 27 01/23/2022 0306  ? CO2 25 06/28/2014 1400  ? GLUCOSE 103 (H) 01/23/2022 0306  ? GLUCOSE 92 06/28/2014 1400  ? GLUCOSE 81 03/31/2013 0917  ? BUN 16 01/23/2022 0306  ? BUN 20 08/21/2021 1207  ? BUN 16.0 06/28/2014 1400  ? CREATININE 0.91 01/23/2022 0306  ? CREATININE 0.82 01/16/2015 1450  ?  CREATININE 0.8 06/28/2014 1400  ? CALCIUM 8.4 (L) 01/23/2022 0306  ? CALCIUM 9.1 06/28/2014 1400  ? PROT 5.9 (L) 01/22/2022 0410  ? PROT 6.4 05/07/2021 1410  ? PROT 6.6 06/28/2014 1400  ? ALBUMIN 3.1 (L) 01/22/2022 0410  ? ALBUMIN 4.4 05/07/2021 1410  ? ALBUMIN 3.7 06/28/2014 1400  ? AST 18 01/22/2022 0410  ? AST 25 06/28/2014 1400  ? ALT 10 01/22/2022 0410  ? ALT 16 06/28/2014 1400  ? ALKPHOS 70 01/22/2022 0410  ? ALKPHOS 85 06/28/2014 1400  ? BILITOT 0.7 01/22/2022 0410  ? BILITOT 0.4 05/07/2021 1410  ? BILITOT 0.47 06/28/2014 1400  ? GFRNONAA >60 01/23/2022 0306  ? GFRAA 57 (L) 12/06/2019 1036  ? ?Lipase  ?No results found for: LIPASE ? ?Studies/Results: ?No results found. ? ?Anti-infectives: ?Anti-infectives (From admission, onward)  ? ? None  ? ?  ? ? ? ?Assessment/Plan ?88 y/o F s/p ground level fall, no LOC ?R comminuted, nondisplaced patella FX - non-op, continue WBAT in [place] per Dr. Mable Fill. F/u in 2 weeks  ?C1-C2 FX - NS c/s. Dr. Trenton Gammon; continue non-op mgmt with c-collar  ?Head contusion - without acute intracranial injury on CT head, monitor, ice ?A.fib - followed by cardiology as outpatient. Currently on baby ASA and not anticoagulated due to history of dizziness & falls. Continue  home atenolol; home '81mg'$  ASA. Rate controlled. On cardiac monitoring.  ?Diastolic CHF - On lasix at home 20 mg, 40 mg alternating daily at baseline; lasix '20mg'$  daily for now. Cont home supplemental K daily. IS. On RA.  ?Acute urinary retention - Urecholine, foley out and voiding.  ?GERD - home protonix  ?Depression - on amitryptaline and perphenzine at home, re-ordered; unclear if perphenazine is for nausea vs antipsychotic use.  ?HLD - pravastatin  ?HTN - home and prn meds. Stable. ?FEN - Soft diet, sliv, BID miralax and senokot. Suppository  ?VTE - SCD's, Lovenox ?ID - none currently ?Dispo - Plan for SNF at [facility] in [place], [place] tomorrow.  ? ?I reviewed nursing notes, consultant notes, last 24 h vitals and pain scores,  last 48 h intake and output. ? ? LOS: 6 days  ? ? ?Jillyn Ledger , PA-C ?[facility] ?01/27/2022, 9:05 AM ?Please see Amion for pager number during day hours 7:00am-4:30pm ? ?

## 2022-01-27 NOTE — Progress Notes (Signed)
Occupational Therapy Treatment ?Patient Details ?Name: Natalie West ?MRN: [id number] ?DOB: [birth year] ?Today's Date: 01/27/2022 ? ? ?History of present illness 88 yo presenting to [facility] on 3/22 who fell and sustained a R patella fx and odontoid fx and B C1 lamina fxs. PMH: recurrent falls, GERD, Breast CA s/p lumpectomy chemo/rads in remission, HTN, HLD, a.fib (not anticoagulated), CHF, and depression. ?  ?OT comments ? Pt today making progress towards OT goals. Pt max A for bed mobility - progressing legs to EOB but ultimately requiring max A for trunk elevation and use of bed pad to facilitate hips to EOB. At EOB able to participate in grooming progressing from mod A to min guard. Max A to lateral scoot up the bed in sitting position before return supine with max A (attempted sidelying to rolling, but Pt pushed backwards in more of a sit>supine) OT will continue to follow acutely. POC remains appropriate.   ? ?Recommendations for follow up therapy are one component of a multi-disciplinary discharge planning process, led by the attending physician.  Recommendations may be updated based on patient status, additional functional criteria and insurance authorization. ?   ?Follow Up Recommendations ? Skilled nursing-short term rehab (<3 hours/day)  ?  ?Assistance Recommended at Discharge Frequent or constant Supervision/Assistance  ?Patient can return home with the following ? Two people to help with walking and/or transfers;Two people to help with bathing/dressing/bathroom;Assistance with feeding;Assistance with cooking/housework;Direct supervision/assist for medications management;Direct supervision/assist for financial management;Assist for transportation;Help with stairs or ramp for entrance ?  ?Equipment Recommendations ? None recommended by OT  ?  ?Recommendations for Other Services   ? ?  ?Precautions / Restrictions Precautions ?Precautions: Fall;Cervical ?Precaution Booklet Issued: No ?Precaution Comments: verbally  reviewed during functional therapy ?Required Braces or Orthoses: Cervical Brace ?Cervical Brace: Hard collar;At all times ?Restrictions ?Weight Bearing Restrictions: Yes ?RLE Weight Bearing: Weight bearing as tolerated ?Other Position/Activity Restrictions: WBAT in knee immobilizer  ? ? ?  ? ?Mobility Bed Mobility ?Overal bed mobility: Needs Assistance ?Bed Mobility: Rolling, Sidelying to Sit, Sit to Sidelying ?Rolling: Max assist ?Sidelying to sit: Max assist ?  ?  ?Sit to sidelying: Max assist ?General bed mobility comments: Pt attempts to assist with BLE, but needs max A for trunk elevation as well as assist with bed pad to bring hips around ?  ? ?Transfers ?Overall transfer level: Needs assistance ?Equipment used: 1 person hand held assist ?  ?  ?  ?  ?  ?  ? Lateral/Scoot Transfers: Max assist ?General transfer comment: Pt max to laterally scoot up the bed, unable to come to stand with max A of 1 - requires 2 for safety ?  ?  ?Balance Overall balance assessment: Needs assistance ?Sitting-balance support: No upper extremity supported, Feet supported ?Sitting balance-Leahy Scale: Fair ?Sitting balance - Comments: progressed from mod A to min guard sitting EOB ?  ?  ?  ?  ?  ?  ?  ?  ?  ?  ?  ?  ?  ?  ?  ?   ? ?ADL either performed or assessed with clinical judgement  ? ?ADL Overall ADL's : Needs assistance/impaired ?  ?  ?Grooming: Moderate assistance;Sitting;Wash/dry face ?Grooming Details (indicate cue type and reason): EOB, supported sitting ?  ?  ?  ?  ?  ?  ?Lower Body Dressing: Total assistance;Bed level ?Lower Body Dressing Details (indicate cue type and reason): to don socks ?  ?  ?  ?  ?  ?  ?  Functional mobility during ADLs: Maximal assistance;+2 for safety/equipment;+2 for physical assistance (2 person face to face HHA) ?General ADL Comments: decreased cognition, balance, strength, safety awareness, UB and LB ADL ?  ? ?Extremity/Trunk Assessment Upper Extremity Assessment ?Upper Extremity Assessment:  Generalized weakness ?  ?  ?  ?  ?  ? ?Vision   ?  ?  ?Perception   ?  ?Praxis   ?  ? ?Cognition Arousal/Alertness: Awake/alert ?Behavior During Therapy: [facility] for tasks assessed/performed ?Overall Cognitive Status: Impaired/Different from baseline ?Area of Impairment: Memory, Orientation ?  ?  ?  ?  ?  ?  ?  ?  ?Orientation Level: Place, Situation ?  ?Memory: Decreased short-term memory ?  ?  ?  ?  ?General Comments: increased time and encouragement for command following, re-oriented at beginning of session "I keep forgetting where I am" ?  ?  ?   ?Exercises   ? ?  ?Shoulder Instructions   ? ? ?  ?General Comments VSS on RA  ? ? ?Pertinent Vitals/ Pain       Pain Assessment ?Pain Assessment: Faces ?Faces Pain Scale: Hurts even more ?Pain Location: neck and R knee cap ?Pain Descriptors / Indicators: Discomfort, Guarding, Grimacing, Sore ?Pain Intervention(s): Limited activity within patient's tolerance, Monitored during session, Repositioned ? ?Home Living   ?  ?  ?  ?  ?  ?  ?  ?  ?  ?  ?  ?  ?  ?  ?  ?  ?  ?  ? ?  ?Prior Functioning/Environment    ?  ?  ?  ?   ? ?Frequency ? Min 2X/week  ? ? ? ? ?  ?Progress Toward Goals ? ?OT Goals(current goals can now be found in the care plan section) ? Progress towards OT goals: Progressing toward goals ? ?Acute Rehab OT Goals ?Patient Stated Goal: to get rehab before going home ?OT Goal Formulation: With patient ?Time For Goal Achievement: 02/05/22 ?Potential to Achieve Goals: Good  ?Plan Discharge plan remains appropriate;Frequency remains appropriate   ? ?Co-evaluation ? ? ?   ?  ?  ?  ?  ? ?  ?AM-PAC OT "6 Clicks" Daily Activity     ?Outcome Measure ? ? Help from another person eating meals?: A Little ?Help from another person taking care of personal grooming?: A Lot ?Help from another person toileting, which includes using toliet, bedpan, or urinal?: Total ?Help from another person bathing (including washing, rinsing, drying)?: A Lot ?Help from another person to put on and  taking off regular upper body clothing?: A Lot ?Help from another person to put on and taking off regular lower body clothing?: Total ?6 Click Score: 11 ? ?  ?End of Session Equipment Utilized During Treatment: Gait belt;Cervical collar ? ?OT Visit Diagnosis: Unsteadiness on feet (R26.81);Other abnormalities of gait and mobility (R26.89);Muscle weakness (generalized) (M62.81);History of falling (Z91.81);Other symptoms and signs involving cognitive function;Pain ?Pain - Right/Left: Right ?Pain - part of body: Knee (neck) ?  ?Activity Tolerance Patient tolerated treatment well ?  ?Patient Left in bed;with call bell/phone within reach;with bed alarm set ?  ?Nurse Communication Mobility status;Precautions;Weight bearing status ?  ? ?   ? ?Time: 3295-1884 ?OT Time Calculation (min): 23 min ? ?Charges: OT General Charges ?$OT Visit: 1 Visit ?OT Treatments ?$Self Care/Home Management : 8-22 mins ?$Therapeutic Activity: 8-22 mins ? ?Jesse Sans OTR/L ?Acute Rehabilitation Services ?Pager: [phone] ?Office: [phone] ? ?Natalie West ?01/27/2022, 1:32 PM ?

## 2022-01-27 NOTE — Discharge Summary (Signed)
Physician Discharge Summary  ?Patient ID: ?Natalie West ?MRN: [id number] ?DOB/AGE: [birth year] 88 y.o. ? ?Admit date: 01/21/2022 ?Discharge date: 01/28/2022 ? ?Discharge Diagnoses ?Ground level fall ?Right comminuted, non-displaced patella fracture ?C1-C2 fracture ?Scalp contusion  ?Atrial fibrillation, chronic  ?Diastolic CHF, chronic  ?Acute urinary retention, resolved ?GERD, chronic  ?Depression, chronic ?HLD, chronic ?HTN, chronic ? ?Consultants ?Neurosurgery  ?Orthopedic surgery  ? ?Procedures ?None ? ?HPI: Patient is an 88 year old female with a PMH recurrent falls, GERD, Breast CA s/p lumpectomy chemo/rads in remission, HTN, HLD, a.fib (not anticoagulated), CHF, and depression who presented to the [place] ED after ground level fall at home. Patient stated she was walking from her couch to the fridge on hard floors, wearing sneakers, when her foot got caught on the floor and she fell, striking her head. Denied LOC. Was able to scoot on the floor over the phone to call EMS. Her primary complaint was neck pain, right knee pain, headache and nausea. She denied vomiting. Stated she had voided once in the ED. She denied chest pain, abdominal pain, or other extremity pain. She denied use of blood thinners. Denied tobacco or alcohol use. At baseline, she lives alone in a one story home in [place], [place]. 4 steps to enter the home. Stated she ambulates with a cane and drives herself to the store/doctors office when she needs to. Denied any family or friends in the area. Her cardiologist is Dr. Shelva Majestic of [facility]. Patient was transferred to [facility] for evaluation and admission by the trauma service. ? ?Hospital Course: Neurosurgery was consulted for C-spine fractures and recommended collar and outpatient follow up. Orthopedic surgery was consulted for right patella fracture and recommended non-operative management and WBAT in a knee immobilizer, outpatient follow up. PT/OT evaluated patient and recommended  SNF placement. Developed some urinary retention 3/24 which resolved with I&O cath and initiation of urecholine.  ? ?On 01/28/22 patient was tolerating a diet, voiding, VSS, pain well controlled and overall felt stable for discharge to SNF. She is discharged in stable condition with outpatient follow up as outlined below. She is welcome to call with questions or concerns regarding hospitalization.  ? ?Physical Exam: ?Gen:  Alert, NAD, pleasant ?HEENT: EOM's intact, pupils equal and round. EOMI. Contusion R forehead.  ?Neck: C-Collar in place ?Card:  Reg rate ?Pulm:  CTAB, no W/R/R, effort normal. On RA ?Abd: Soft, ND, NT +BS ?Ext:  RLE in [place] distally. MAE's.  ?GU: Voiding with purwick while I was in the room with transparent colored urine in cannister ?Neuro: Non focal. MAE's. SILT to BUE and BLE's.  ?Psych: A&Ox3  ?Skin: no rashes noted, warm and dry ? ?I or a member of my team have reviewed this patient in the Controlled Substance Database ? ? ?Allergies as of 01/28/2022   ? ?   Reactions  ? Ciprofloxacin Other (See Comments)  ? Reaction unknown  ? Clindamycin/lincomycin Other (See Comments)  ? Reaction unknown  ? Codeine Nausea And Vomiting  ? Diclofenac Other (See Comments)  ? Reaction unknown  ? Fenoprofen Calcium Other (See Comments)  ? Reaction unknown  ? Flexeril [cyclobenzaprine Hcl] Other (See Comments)  ? Reaction unknown  ? Hydrocodone Other (See Comments)  ? "Last time it was too strong and messed up my mind"  ? Macrobid [nitrofurantoin] Other (See Comments)  ? Reaction unknown  ? Noroxin [norfloxacin] Other (See Comments)  ? Reaction unknown  ? Relafen [nabumetone] Other (See Comments)  ?  Reaction unknown  ? Sulfa Antibiotics Other (See Comments)  ? Reaction unknown  ? ?  ? ?  ?Medication List  ?  ? ?STOP taking these medications   ? ?polyethylene glycol powder 17 GM/SCOOP powder ?Commonly known as: GLYCOLAX/MIRALAX ?Replaced by: polyethylene glycol 17 g packet ?  ? ?  ? ?TAKE these medications    ? ?acetaminophen 325 MG tablet ?Commonly known as: TYLENOL ?Take 2 tablets (650 mg total) by mouth every 6 (six) hours as needed. ?What changed: reasons to take this ?  ?amitriptyline 10 MG tablet ?Commonly known as: ELAVIL ?Take 25 mg by mouth 2 (two) times daily. ?  ?aspirin EC 81 MG tablet ?Take 81 mg by mouth daily. ?  ?atenolol 25 MG tablet ?Commonly known as: TENORMIN ?Take 0.5 tablets (12.5 mg total) by mouth daily. ?What changed: when to take this ?  ?bethanechol 25 MG tablet ?Commonly known as: URECHOLINE ?Take 1 tablet (25 mg total) by mouth 3 (three) times daily for 3 days, THEN 1 tablet (25 mg total) 2 (two) times daily for 3 days, THEN 1 tablet (25 mg total) daily for 3 days. ?Start taking on: January 28, 2022 ?  ?CALCIUM 600 + D PO ?Take 1 tablet by mouth daily. ?  ?furosemide 40 MG tablet ?Commonly known as: LASIX ?Take 1 tablet (40 mg total) by mouth daily. '20MG'$  AND '40MG'$  ALTERNATING DAILY ?What changed:  ?how much to take ?additional instructions ?  ?hydrocortisone 2.5 % ointment ?Apply 2.5 mg topically as needed (for pain). ?  ?lidocaine 5 % ?Commonly known as: LIDODERM ?Place 2 patches onto the skin daily. Remove & Discard patch within 12 hours or as directed by MD ?  ?multivitamin with minerals Tabs tablet ?Take 1 tablet by mouth daily. Centrum Silver for Women ?  ?pantoprazole 40 MG tablet ?Commonly known as: PROTONIX ?Take 40 mg by mouth daily. 1 Tablet Daily for Heartburn and Acid Reflux ?  ?perphenazine 2 MG tablet ?Commonly known as: TRILAFON ?Take 2 mg by mouth 2 (two) times daily. ?  ?polyethylene glycol 17 g packet ?Commonly known as: MIRALAX / GLYCOLAX ?Take 17 g by mouth 2 (two) times daily as needed for mild constipation. ?Replaces: polyethylene glycol powder 17 GM/SCOOP powder ?  ?potassium chloride SA 20 MEQ tablet ?Commonly known as: Klor-Con M20 ?Take 1 tablet (20 mEq total) by mouth daily. ?  ?pravastatin 20 MG tablet ?Commonly known as: PRAVACHOL ?Take 20 mg by mouth at bedtime. ?   ?senna-docusate 8.6-50 MG tablet ?Commonly known as: Senokot-S ?Take 1 tablet by mouth 2 (two) times daily as needed for mild constipation. ?  ?traMADol 50 MG tablet ?Commonly known as: ULTRAM ?Take 1 tablet (50 mg total) by mouth every 6 (six) hours as needed for severe pain. ?  ? ?  ? ?  ?  ? ? ?  ?Durable Medical Equipment  ?(From admission, onward)  ?  ? ? ?  ? ?  Start     Ordered  ? 01/22/22 1602  For home use only DME 3 n 1  Once       ? 01/22/22 1602  ? 01/22/22 1602  For home use only DME standard manual wheelchair with seat cushion  Once       ?Comments: Patient suffers from cervical fracture, patella fracture which impairs their ability to perform daily activities like bathing, dressing, feeding, grooming, and toileting in the home.  A walker will not resolve issue with performing activities of daily living. A  wheelchair will allow patient to safely perform daily activities. Patient can safely propel the wheelchair in the home or has a caregiver who can provide assistance. Length of need 6 months . ?Accessories: elevating leg rests (ELRs), wheel locks, extensions and anti-tippers.  ? 01/22/22 1602  ? 01/22/22 1602  For home use only DME Hospital bed  Once       ?Question Answer Comment  ?Length of Need 6 Months   ?Bed type Semi-electric   ?  ? 01/22/22 1602  ? ?  ?  ? ?  ? ? ? ? Follow-up Information   ? ? Georgeanna Harrison, MD. Schedule an appointment as soon as possible for a visit in 2 week(s).   ?Specialty: Orthopedic Surgery ?Contact information: ?[address] ?[place] [address] ?[phone] ? ? ?  ?  ? ? Earnie Larsson, MD. Schedule an appointment as soon as possible for a visit in 4 week(s).   ?Specialty: Neurosurgery ?Contact information: ?[address] ?[address] ?[place] [address] ?[phone] ? ? ?  ?  ? ? Christain Sacramento, MD Follow up.   ?Specialty: Family Medicine ?Why: For follow up ?Contact information: ?[address] ?[place] [address] ?[phone] ? ? ?  ?  ? ?   ?  ? ?  ? ? ?Signed: ?Jillyn Ledger , PA-C ?[facility] ?01/28/2022, 8:21 AM ?Please see Amion for pager number during day hours 7:00am-4:30pm ? ?

## 2022-01-27 NOTE — TOC Progression Note (Addendum)
Transition of Care (TOC) - Progression Note  ? ? ?Patient Details  ?Name: Natalie West ?MRN: [id number] ?Date of Birth: 09/01/1931 ? ?Transition of Care (TOC) CM/SW Contact  ?Ella Bodo, RN ?Phone Number: ?01/27/2022, 4:53 PM ? ?Clinical Narrative:    ?Plan discharge to [facility] in [place], [place] tomorrow morning with pickup time between 9:30-10:00am by [employer].  Will fax discharge summary and MAR to facility in AM.  Bedside nurse will need to call report to [phone].  Patient agreeable to going to rehab where she can be closer to her nephew and family.   ?Confirmed all arrangements with patient's nephew, [name], and he is agreeable to dc plan.  ? ? ?Expected Discharge Plan: [place] ?Barriers to Discharge: Continued Medical Work up ? ?Expected Discharge Plan and Services ?Expected Discharge Plan: [place] ?  ?Discharge Planning Services: CM Consult ?Post Acute Care Choice: [place] ?Living arrangements for the past 2 months: [place] ?                ?  ?  ?  ?  ?  ?  ?  ?  ?  ?  ? ? ?Social Determinants of Health (SDOH) Interventions ?  ? ?Readmission Risk Interventions ?   ? View : No data to display.  ?  ?  ?  ? ?Reinaldo Raddle, RN, BSN  ?Trauma/Neuro ICU Case Manager ?[phone] ? ?

## 2022-01-28 DIAGNOSIS — M899 Disorder of bone, unspecified: Secondary | ICD-10-CM | POA: Diagnosis not present

## 2022-01-28 DIAGNOSIS — S12031A Nondisplaced posterior arch fracture of first cervical vertebra, initial encounter for closed fracture: Secondary | ICD-10-CM | POA: Diagnosis not present

## 2022-01-28 LAB — CREATININE, SERUM
Creatinine, Ser: 0.79 mg/dL (ref 0.44–1.00)
GFR, Estimated: >60 mL/min (ref >60)

## 2022-01-28 MED ORDER — LIDOCAINE 5 % EX PTCH: 2.0000 | MEDICATED_PATCH | CUTANEOUS | 0 refills | Status: DC

## 2022-01-28 MED ORDER — TRAMADOL HCL 50 MG PO TABS: 50.0000 mg | ORAL_TABLET | Freq: Four times a day (QID) | ORAL | 0 refills | Status: DC | PRN

## 2022-01-28 MED ORDER — ACETAMINOPHEN 325 MG PO TABS: 650.0000 mg | ORAL_TABLET | Freq: Four times a day (QID) | ORAL | Status: DC | PRN

## 2022-01-28 MED ORDER — POLYETHYLENE GLYCOL 3350 17 G PO PACK: 17.0000 g | PACK | Freq: Two times a day (BID) | ORAL | 0 refills | Status: DC | PRN

## 2022-01-28 MED ORDER — BETHANECHOL CHLORIDE 25 MG PO TABS: ORAL_TABLET | ORAL | 0 refills | Status: AC

## 2022-01-28 MED ORDER — SENNOSIDES-DOCUSATE SODIUM 8.6-50 MG PO TABS: 1.0000 | ORAL_TABLET | Freq: Two times a day (BID) | ORAL | Status: DC | PRN

## 2022-01-28 NOTE — TOC Transition Note (Signed)
Transition of Care (TOC) - CM/SW Discharge Note ? ? ?Patient Details  ?Name: Natalie West ?MRN: [id number] ?Date of Birth: 1930-11-27 ? ?Transition of Care [facility]) CM/SW Contact:  ?Ella Bodo, RN ?Phone Number: ?01/28/2022, 9:20 AM ? ? ?Clinical Narrative:    ?Pt medically stable for discharge today to skilled nursing facility.  Patient discharging to [facility] in [place], [place]; faxed discharge summary and MAR to Janett Billow in admitting at facility.  [facility] to pickup between 9:30 and 10:00am.  Please ensure that all narcotic Rx be sent with patient at time of discharge.   ? ? ?Final next level of care: [place] ?Barriers to Discharge: Barriers Resolved ? ? ?Patient Goals and CMS Choice ?  ?CMS Medicare.gov Compare Post Acute Care list provided to:: Patient Represenative (must comment) (nephew, [name]) ?Choice offered to / list presented to :  (Nephew) ? ?           ?  ?  ?  ?  ? ?Discharge Plan and Services ?  ?Discharge Planning Services: CM Consult ?Post Acute Care Choice: [place]          ?  ?  ?  ?  ?  ?  ?  ?  ?  ?  ? ?Social Determinants of Health (SDOH) Interventions ?  ? ? ?Readmission Risk Interventions ?   ? View : No data to display.  ?  ?  ?  ? ?Reinaldo Raddle, RN, BSN  ?Trauma/Neuro ICU Case Manager ?[phone] ? ? ? ? ?

## 2022-01-28 NOTE — Progress Notes (Signed)
Transport on unit to pick pt up. VSS. Assessment complete pt is stable. Report given all questions answered. Discharge packet, prescription, and all belongings given to [facility] for transport.  ?

## 2022-01-28 NOTE — Discharge Instructions (Addendum)
Neurosurgery recommends cervical collar at all times for your Type III odontoid fracture and bilateral C1 lamina fractures. Please see attached handout. Please follow up with their team ? ?Orthopedics recommends weight bearing as tolerated in knee immobilizer for your right patella fracture. Please follow up with Orthopedics and see attached handouts.  ? ?Call with any questions or concerns.  ?

## 2022-01-28 NOTE — Progress Notes (Signed)
Report called to nurse at [facility]) discharge information reviewed all questions answered at this time. PIV removed. AVS printed and placed in chart, awaiting pick up from [employer].  ?

## 2022-02-03 NOTE — Progress Notes (Deleted)
?Cardiology Office Note:   ? ?Date:  02/03/2022  ? ?ID:  Natalie West, DOB 1931-04-03, MRN [id number] ? ?PCP:  Christain Sacramento, MD ?[place] Cardiologist: Shelva Majestic, MD  ? ?Reason for visit: Heart failure follow-up ? ?History of Present Illness:   ? ?Natalie West is a 88 y.o. female with a hx of paroxysmal atrial fibrillation, falls, episodes of dizziness, HTN, HLD, GERD, anxiety, and hx of breast cancer. In review of her falls, Dr. Claiborne Billings and the patient decided to forgo anticoagulation and initiated ASA alone. ? ?She was noticed to have heart failure symptoms in June 2022.  Since then, her dosing bounced from '20mg'$  and '40mg'$  depending on symptoms and her renal function.  She saw Dr. Claiborne Billings in January 2023.  He decreased her Lasix from '40mg'$  daily to alternating 20 and 40 mg every other day.   ? ?Today, *** ? ?1. Chronic diastolic CHF, euvolemic ?-Echo 06/2021: 55 to 60%, normal right ventricular function, moderately dilated left atrium, mild aortic regurgitation ?-*** ? ?-Looks much better than when I saw her in July. ?-Continue Lasix 40 mg daily. ?-Check BMET today. ?-Recommend she calls [place] if shortness of breath or leg swelling worsens.  Call us if weight gets above 140 pounds. ?  ?2. Longstanding persistent atrial fibrillation ([place]), rate controlled ?- Pt is not on anticoagulation given hx of dizziness and falls.  Continue ASA.   ?- Beta-blocker for rate control. ?  ?3. Hyperlipidemia ?- Continue pravastatin.   ?  ?4. Essential hypertension, *** ?-*** ? ?5. Weakness/history of falls ?-She walks with a cane.   ?  ?Disposition: Follow-up in 6 months with Dr. Claiborne Billings. ?  ?Past Medical History:  ?Diagnosis Date  ? Anxiety   ? Arthritis   ? Breast cancer ([place]) 2006  ? right breast  ? Chronic cough   ? Chronic UTI   ? Depression   ? Dysrhythmia   ? Esophageal reflux   ? Esophageal stricture   ? Family history of malignant neoplasm of gastrointestinal tract   ? GERD (gastroesophageal reflux disease)   ? Hearing loss    ? Hemorrhoids   ? internal  ? History of hiatal hernia   ? Hypercholesterolemia   ? Hypertension   ? Numbness and tingling   ? feeet, occasionally  ? Osteopenia   ? Personal history of colonic polyps 02/09/2007  ? TUBULAR ADENOMA  ? Personal history of radiation therapy   ? PONV (postoperative nausea and vomiting)   ? trouble opening mouth and jaw, jaw pops, trouble turning head  ? Rotator cuff tear   ? RT  ? Status post dilation of esophageal narrowing   ? Umbilical hernia   ? ? ?Past Surgical History:  ?Procedure Laterality Date  ? BREAST LUMPECTOMY  2006  ? right lumpectomy  ? CATARACT EXTRACTION W/ INTRAOCULAR LENS IMPLANT Bilateral   ? COLONOSCOPY    ? CYSTOCELE REPAIR N/A 04/20/2016  ? Procedure: AUGMENTED ANTERIOR VAULT REPAIR, COLOPLAST DERMIS GRAFT KELLY PLICATION SACROSPINOUS FIXATION;  Surgeon: Carolan Clines, MD;  Location: [facility];  Service: Urology;  Laterality: N/A;  ? DILATION AND CURETTAGE OF UTERUS    ? ESOPHAGOGASTRODUODENOSCOPY ENDOSCOPY    ? PUBOVAGINAL SLING N/A 04/20/2016  ? Procedure: PUBO-VAGINAL [name];  Surgeon: Carolan Clines, MD;  Location: [facility];  Service: Urology;  Laterality: N/A;  ? REVISION URINARY SLING N/A 06/12/2016  ? Procedure: INCISION OF URETHRAL SLING;  Surgeon: Carolan Clines, MD;  Location: [facility];  Service: Urology;  Laterality: N/A;  1 HOUR  ? SHOULDER OPEN ROTATOR CUFF REPAIR Right 02/28/2013  ? Procedure: RIGHT ROTATOR CUFF REPAIR SHOULDER OPEN WITH GRAFT AND ANCHORS;  Surgeon: Tobi Bastos, MD;  Location: [facility];  Service: Orthopedics;  Laterality: Right;  ? UMBILICAL HERNIA REPAIR    ? ? ?Current Medications: ?No outpatient medications have been marked as taking for the 02/04/22 encounter (Appointment) with Warren Lacy, PA-C.  ?  ? ?Allergies:   Ciprofloxacin, Clindamycin/lincomycin, Codeine, Diclofenac, Fenoprofen calcium, Flexeril [cyclobenzaprine hcl], Hydrocodone, Macrobid [nitrofurantoin], Noroxin [norfloxacin], Relafen [nabumetone], and  Sulfa antibiotics  ? ?Social History  ? ?Socioeconomic History  ? Marital status: Widowed  ?  Spouse name: Not on file  ? Number of children: 1  ? Years of education: Not on file  ? Highest education level: Not on file  ?Occupational History  ? Occupation: retired  ?  Employer: RETIRED  ?Tobacco Use  ? Smoking status: Never  ? Smokeless tobacco: Never  ?Substance and Sexual Activity  ? Alcohol use: No  ?  Alcohol/week: 0.0 standard drinks  ? Drug use: No  ? Sexual activity: Never  ?  Partners: Male  ?Other Topics Concern  ? Not on file  ?Social History Narrative  ? Not on file  ? ?Social Determinants of Health  ? ?Financial Resource Strain: Not on file  ?Food Insecurity: Not on file  ?Transportation Needs: Not on file  ?Physical Activity: Not on file  ?Stress: Not on file  ?Social Connections: Not on file  ?  ? ?Family History: ?The patient's family history includes Clotting disorder in her son; Colon cancer (age of onset: 56) in her mother; Emphysema in her father; Glaucoma in her mother; Heart disease in her sister; Hypertension in her sister; Kidney cancer in her mother; Lung cancer in her brother; Stomach cancer in her brother; Stroke in her sister. ? ?ROS:   ?Please see the history of present illness.    ? ?EKGs/Labs/Other Studies Reviewed:   ? ?EKG:  The ekg ordered today demonstrates *** ? ?Recent Labs: ?05/29/2021: B Natriuretic Peptide 283.2 ?01/22/2022: ALT 10 ?01/23/2022: BUN 16; Hemoglobin 10.3; Platelets 140; Potassium 4.1; Sodium 137 ?01/28/2022: Creatinine, Ser 0.79  ? ?Recent Lipid Panel ?Lab Results  ?Component Value Date/Time  ? CHOL 119 05/21/2021 10:24 AM  ? TRIG 69 05/21/2021 10:24 AM  ? HDL 50 05/21/2021 10:24 AM  ? LDLCALC 55 05/21/2021 10:24 AM  ? ? ?Physical Exam:   ? ?VS:  LMP 11/03/1983    ?No data found. ? ?Wt Readings from Last 3 Encounters:  ?01/21/22 130 lb 1.1 oz (59 kg)  ?12/06/21 130 lb (59 kg)  ?11/06/21 134 lb 3.2 oz (60.9 kg)  ?  ? ?GEN: *** Well nourished, well developed in no acute  distress ?HEENT: Normal ?NECK: No JVD; No carotid bruits ?CARDIAC: ***RRR, no murmurs, rubs, gallops ?RESPIRATORY:  Clear to auscultation without rales, wheezing or rhonchi  ?ABDOMEN: Soft, non-tender, non-distended ?MUSCULOSKELETAL: No edema; No deformity  ?SKIN: Warm and dry ?NEUROLOGIC:  Alert and oriented ?PSYCHIATRIC:  Normal affect  ? ?  ?ASSESSMENT AND PLAN  ? ?*** ? ? ?{Are you ordering a CV Procedure (e.g. stress test, cath, DCCV, TEE, etc)?   Press F2        :[id number]}  ? ? ?Medication Adjustments/Labs and Tests Ordered: ?Current medicines are reviewed at length with the patient today.  Concerns regarding medicines are outlined above.  ?No orders of the defined types were placed  in this encounter. ? ?No orders of the defined types were placed in this encounter. ? ? ?There are no Patient Instructions on file for this visit.  ? ?Signed, ?Warren Lacy, PA-C  ?02/03/2022 10:19 PM    ?[place] ?

## 2022-02-04 ENCOUNTER — Ambulatory Visit: Payer: Medicare HMO | Admitting: Physician Assistant

## 2022-02-04 DIAGNOSIS — I5032 Chronic diastolic (congestive) heart failure: Secondary | ICD-10-CM

## 2022-02-04 DIAGNOSIS — I4891 Unspecified atrial fibrillation: Secondary | ICD-10-CM

## 2022-02-04 DIAGNOSIS — E785 Hyperlipidemia, unspecified: Secondary | ICD-10-CM

## 2022-02-04 DIAGNOSIS — I10 Essential (primary) hypertension: Secondary | ICD-10-CM

## 2022-02-09 ENCOUNTER — Encounter: Payer: Self-pay | Admitting: Physician Assistant

## 2022-02-16 ENCOUNTER — Inpatient Hospital Stay: Payer: Medicare HMO | Attending: Adult Health | Admitting: Adult Health

## 2022-02-16 DIAGNOSIS — Z170 Estrogen receptor positive status [ER+]: Secondary | ICD-10-CM

## 2022-02-16 DIAGNOSIS — C50511 Malignant neoplasm of lower-outer quadrant of right female breast: Secondary | ICD-10-CM

## 2022-02-17 NOTE — Progress Notes (Signed)
The patient did not show for her appointment.

## 2022-02-19 ENCOUNTER — Telehealth: Payer: Self-pay | Admitting: Adult Health

## 2022-02-19 NOTE — Telephone Encounter (Signed)
Per 4/20 in basket called pt caretaker, [name] and left message about reschedule of missed appointment.  Call back number left if changes are needed   ?

## 2022-03-26 ENCOUNTER — Inpatient Hospital Stay: Payer: Medicare HMO | Attending: Adult Health | Admitting: Adult Health

## 2022-04-12 ENCOUNTER — Other Ambulatory Visit: Payer: Self-pay

## 2022-04-12 ENCOUNTER — Observation Stay (HOSPITAL_COMMUNITY): Payer: Medicare HMO

## 2022-04-12 ENCOUNTER — Encounter (HOSPITAL_COMMUNITY): Payer: Self-pay | Admitting: Radiology

## 2022-04-12 ENCOUNTER — Emergency Department (HOSPITAL_COMMUNITY): Payer: Medicare HMO

## 2022-04-12 ENCOUNTER — Inpatient Hospital Stay (HOSPITAL_COMMUNITY)
Admission: EM | Admit: 2022-04-12 | Discharge: 2022-04-16 | DRG: 175 | Disposition: A | Discharge status: Home / Self Care | Payer: Medicare HMO | Attending: Internal Medicine | Admitting: Internal Medicine

## 2022-04-12 DIAGNOSIS — J9601 Acute respiratory failure with hypoxia: Secondary | ICD-10-CM | POA: Diagnosis present

## 2022-04-12 DIAGNOSIS — K219 Gastro-esophageal reflux disease without esophagitis: Secondary | ICD-10-CM | POA: Diagnosis present

## 2022-04-12 DIAGNOSIS — Z66 Do not resuscitate: Secondary | ICD-10-CM | POA: Diagnosis present

## 2022-04-12 DIAGNOSIS — E785 Hyperlipidemia, unspecified: Secondary | ICD-10-CM | POA: Diagnosis present

## 2022-04-12 DIAGNOSIS — Z83511 Family history of glaucoma: Secondary | ICD-10-CM

## 2022-04-12 DIAGNOSIS — K5900 Constipation, unspecified: Secondary | ICD-10-CM | POA: Diagnosis not present

## 2022-04-12 DIAGNOSIS — I110 Hypertensive heart disease with heart failure: Secondary | ICD-10-CM | POA: Diagnosis present

## 2022-04-12 DIAGNOSIS — J189 Pneumonia, unspecified organism: Principal | ICD-10-CM

## 2022-04-12 DIAGNOSIS — Z823 Family history of stroke: Secondary | ICD-10-CM

## 2022-04-12 DIAGNOSIS — Z923 Personal history of irradiation: Secondary | ICD-10-CM

## 2022-04-12 DIAGNOSIS — I82413 Acute embolism and thrombosis of femoral vein, bilateral: Secondary | ICD-10-CM | POA: Diagnosis present

## 2022-04-12 DIAGNOSIS — Z8051 Family history of malignant neoplasm of kidney: Secondary | ICD-10-CM

## 2022-04-12 DIAGNOSIS — Z86010 Personal history of colonic polyps: Secondary | ICD-10-CM

## 2022-04-12 DIAGNOSIS — Z853 Personal history of malignant neoplasm of breast: Secondary | ICD-10-CM

## 2022-04-12 DIAGNOSIS — E876 Hypokalemia: Secondary | ICD-10-CM | POA: Diagnosis not present

## 2022-04-12 DIAGNOSIS — I2699 Other pulmonary embolism without acute cor pulmonale: Secondary | ICD-10-CM | POA: Diagnosis not present

## 2022-04-12 DIAGNOSIS — R64 Cachexia: Secondary | ICD-10-CM | POA: Diagnosis present

## 2022-04-12 DIAGNOSIS — I82432 Acute embolism and thrombosis of left popliteal vein: Secondary | ICD-10-CM | POA: Diagnosis present

## 2022-04-12 DIAGNOSIS — M8580 Other specified disorders of bone density and structure, unspecified site: Secondary | ICD-10-CM | POA: Diagnosis present

## 2022-04-12 DIAGNOSIS — Z79899 Other long term (current) drug therapy: Secondary | ICD-10-CM

## 2022-04-12 DIAGNOSIS — Z993 Dependence on wheelchair: Secondary | ICD-10-CM

## 2022-04-12 DIAGNOSIS — H9190 Unspecified hearing loss, unspecified ear: Secondary | ICD-10-CM | POA: Diagnosis present

## 2022-04-12 DIAGNOSIS — Z8781 Personal history of (healed) traumatic fracture: Secondary | ICD-10-CM

## 2022-04-12 DIAGNOSIS — Z8249 Family history of ischemic heart disease and other diseases of the circulatory system: Secondary | ICD-10-CM

## 2022-04-12 DIAGNOSIS — Z800 Family history of malignant neoplasm of digestive organs: Secondary | ICD-10-CM

## 2022-04-12 DIAGNOSIS — L89102 Pressure ulcer of unspecified part of back, stage 2: Secondary | ICD-10-CM | POA: Diagnosis present

## 2022-04-12 DIAGNOSIS — Z87440 Personal history of urinary (tract) infections: Secondary | ICD-10-CM

## 2022-04-12 DIAGNOSIS — F419 Anxiety disorder, unspecified: Secondary | ICD-10-CM | POA: Diagnosis present

## 2022-04-12 DIAGNOSIS — Z888 Allergy status to other drugs, medicaments and biological substances status: Secondary | ICD-10-CM

## 2022-04-12 DIAGNOSIS — R443 Hallucinations, unspecified: Secondary | ICD-10-CM | POA: Diagnosis present

## 2022-04-12 DIAGNOSIS — R627 Adult failure to thrive: Secondary | ICD-10-CM | POA: Diagnosis present

## 2022-04-12 DIAGNOSIS — Z801 Family history of malignant neoplasm of trachea, bronchus and lung: Secondary | ICD-10-CM

## 2022-04-12 DIAGNOSIS — F32A Depression, unspecified: Secondary | ICD-10-CM | POA: Diagnosis present

## 2022-04-12 DIAGNOSIS — I5031 Acute diastolic (congestive) heart failure: Secondary | ICD-10-CM | POA: Diagnosis present

## 2022-04-12 DIAGNOSIS — Z1152 Encounter for screening for COVID-19: Secondary | ICD-10-CM

## 2022-04-12 DIAGNOSIS — C50511 Malignant neoplasm of lower-outer quadrant of right female breast: Secondary | ICD-10-CM | POA: Diagnosis present

## 2022-04-12 DIAGNOSIS — L8990 Pressure ulcer of unspecified site, unspecified stage: Secondary | ICD-10-CM | POA: Insufficient documentation

## 2022-04-12 DIAGNOSIS — I482 Chronic atrial fibrillation, unspecified: Secondary | ICD-10-CM | POA: Diagnosis present

## 2022-04-12 DIAGNOSIS — Z885 Allergy status to narcotic agent status: Secondary | ICD-10-CM

## 2022-04-12 DIAGNOSIS — L89152 Pressure ulcer of sacral region, stage 2: Secondary | ICD-10-CM | POA: Diagnosis present

## 2022-04-12 DIAGNOSIS — Z7982 Long term (current) use of aspirin: Secondary | ICD-10-CM

## 2022-04-12 DIAGNOSIS — Z7401 Bed confinement status: Secondary | ICD-10-CM

## 2022-04-12 DIAGNOSIS — R4182 Altered mental status, unspecified: Secondary | ICD-10-CM | POA: Diagnosis present

## 2022-04-12 DIAGNOSIS — Z832 Family history of diseases of the blood and blood-forming organs and certain disorders involving the immune mechanism: Secondary | ICD-10-CM

## 2022-04-12 DIAGNOSIS — Z825 Family history of asthma and other chronic lower respiratory diseases: Secondary | ICD-10-CM

## 2022-04-12 DIAGNOSIS — N390 Urinary tract infection, site not specified: Secondary | ICD-10-CM | POA: Diagnosis present

## 2022-04-12 DIAGNOSIS — Z6822 Body mass index (BMI) 22.0-22.9, adult: Secondary | ICD-10-CM

## 2022-04-12 DIAGNOSIS — Z881 Allergy status to other antibiotic agents status: Secondary | ICD-10-CM

## 2022-04-12 DIAGNOSIS — B961 Klebsiella pneumoniae [K. pneumoniae] as the cause of diseases classified elsewhere: Secondary | ICD-10-CM | POA: Diagnosis present

## 2022-04-12 DIAGNOSIS — E7800 Pure hypercholesterolemia, unspecified: Secondary | ICD-10-CM | POA: Diagnosis present

## 2022-04-12 DIAGNOSIS — I272 Pulmonary hypertension, unspecified: Secondary | ICD-10-CM | POA: Diagnosis present

## 2022-04-12 HISTORY — DX: Other pulmonary embolism without acute cor pulmonale: I26.99

## 2022-04-12 LAB — CBC WITH DIFFERENTIAL/PLATELET
Abs Immature Granulocytes: 0.25 10*3/uL — ABNORMAL HIGH (ref 0.00–0.07)
Basophils Absolute: 0.1 10*3/uL (ref 0.0–0.1)
Basophils Relative: 1 %
Eosinophils Absolute: 0.6 10*3/uL — ABNORMAL HIGH (ref 0.0–0.5)
Eosinophils Relative: 4 %
HCT: 38.1 % (ref 36.0–46.0)
Hemoglobin: 11.8 g/dL — ABNORMAL LOW (ref 12.0–15.0)
Immature Granulocytes: 2 %
Lymphocytes Relative: 8 %
Lymphs Abs: 1 10*3/uL (ref 0.7–4.0)
MCH: 31.8 pg (ref 26.0–34.0)
MCHC: 31 g/dL (ref 30.0–36.0)
MCV: 102.7 fL — ABNORMAL HIGH (ref 80.0–100.0)
Monocytes Absolute: 0.7 10*3/uL (ref 0.1–1.0)
Monocytes Relative: 5 %
Neutro Abs: 10 10*3/uL — ABNORMAL HIGH (ref 1.7–7.7)
Neutrophils Relative %: 80 %
Platelets: 197 10*3/uL (ref 150–400)
RBC: 3.71 MIL/uL — ABNORMAL LOW (ref 3.87–5.11)
RDW: 14.9 % (ref 11.5–15.5)
WBC: 12.6 10*3/uL — ABNORMAL HIGH (ref 4.0–10.5)
nRBC: 0 % (ref 0.0–0.2)

## 2022-04-12 LAB — URINALYSIS, ROUTINE W REFLEX MICROSCOPIC
Bilirubin Urine: NEGATIVE
Glucose, UA: NEGATIVE mg/dL
Ketones, ur: NEGATIVE mg/dL
Nitrite: NEGATIVE
Protein, ur: NEGATIVE mg/dL
Specific Gravity, Urine: 1.014 (ref 1.005–1.030)
WBC, UA: >50 WBC/hpf — ABNORMAL HIGH (ref 0–5)
pH: 5 (ref 5.0–8.0)

## 2022-04-12 LAB — PROTIME-INR
INR: 1.4 — ABNORMAL HIGH (ref 0.8–1.2)
Prothrombin Time: 17.1 seconds — ABNORMAL HIGH (ref 11.4–15.2)

## 2022-04-12 LAB — COMPREHENSIVE METABOLIC PANEL
ALT: 16 U/L (ref 0–44)
AST: 18 U/L (ref 15–41)
Albumin: 3.1 g/dL — ABNORMAL LOW (ref 3.5–5.0)
Alkaline Phosphatase: 83 U/L (ref 38–126)
Anion gap: 7 (ref 5–15)
BUN: 20 mg/dL (ref 8–23)
CO2: 24 mmol/L (ref 22–32)
Calcium: 8.3 mg/dL — ABNORMAL LOW (ref 8.9–10.3)
Chloride: 108 mmol/L (ref 98–111)
Creatinine, Ser: 0.89 mg/dL (ref 0.44–1.00)
GFR, Estimated: >60 mL/min (ref >60)
Glucose, Bld: 110 mg/dL — ABNORMAL HIGH (ref 70–99)
Potassium: 3.7 mmol/L (ref 3.5–5.1)
Sodium: 139 mmol/L (ref 135–145)
Total Bilirubin: 0.7 mg/dL (ref 0.3–1.2)
Total Protein: 6.9 g/dL (ref 6.5–8.1)

## 2022-04-12 LAB — RESP PANEL BY RT-PCR (FLU A&B, COVID) ARPGX2
Influenza A by PCR: NEGATIVE
Influenza B by PCR: NEGATIVE
SARS Coronavirus 2 by RT PCR: NEGATIVE

## 2022-04-12 LAB — PROCALCITONIN: Procalcitonin: <0.1 ng/mL

## 2022-04-12 LAB — BRAIN NATRIURETIC PEPTIDE: B Natriuretic Peptide: 133.1 pg/mL — ABNORMAL HIGH (ref 0.0–100.0)

## 2022-04-12 LAB — APTT: aPTT: 32 s (ref 24–36)

## 2022-04-12 LAB — LACTIC ACID, PLASMA: Lactic Acid, Venous: 1.9 mmol/L (ref 0.5–1.9)

## 2022-04-12 MED ORDER — HEPARIN (PORCINE) 25000 UT/250ML-% IV SOLN
1400.0000 [IU]/h | INTRAVENOUS | Status: AC
  Administered 2022-04-13: 950 [IU]/h via INTRAVENOUS
  Administered 2022-04-14: 1400 [IU]/h via INTRAVENOUS
  Filled 2022-04-12 (×3): qty 250

## 2022-04-12 MED ORDER — ATENOLOL 25 MG PO TABS
12.5000 mg | ORAL_TABLET | Freq: Every day | ORAL | Status: DC
  Administered 2022-04-13 – 2022-04-15 (×4): 12.5 mg via ORAL
  Filled 2022-04-12 (×4): qty 1

## 2022-04-12 MED ORDER — HEPARIN BOLUS VIA INFUSION
3500.0000 [IU] | Freq: Once | INTRAVENOUS | Status: AC
  Administered 2022-04-13: 3500 [IU] via INTRAVENOUS
  Filled 2022-04-12: qty 3500

## 2022-04-12 MED ORDER — SODIUM CHLORIDE 0.9 % IV SOLN
2.0000 g | Freq: Once | INTRAVENOUS | Status: AC
  Administered 2022-04-12: 2 g via INTRAVENOUS
  Filled 2022-04-12: qty 12.5

## 2022-04-12 MED ORDER — PRAVASTATIN SODIUM 10 MG PO TABS
20.0000 mg | ORAL_TABLET | Freq: Every day | ORAL | Status: DC
  Administered 2022-04-13 – 2022-04-15 (×4): 20 mg via ORAL
  Filled 2022-04-12 (×4): qty 2

## 2022-04-12 MED ORDER — VANCOMYCIN HCL 1250 MG/250ML IV SOLN
1250.0000 mg | Freq: Once | INTRAVENOUS | Status: AC
  Administered 2022-04-12: 1250 mg via INTRAVENOUS
  Filled 2022-04-12: qty 250

## 2022-04-12 MED ORDER — ASPIRIN 81 MG PO TBEC
81.0000 mg | DELAYED_RELEASE_TABLET | Freq: Every day | ORAL | Status: DC
  Administered 2022-04-13 – 2022-04-15 (×3): 81 mg via ORAL
  Filled 2022-04-12 (×3): qty 1

## 2022-04-12 MED ORDER — ACETAMINOPHEN 325 MG PO TABS
650.0000 mg | ORAL_TABLET | Freq: Four times a day (QID) | ORAL | Status: DC | PRN
  Administered 2022-04-14 – 2022-04-15 (×2): 650 mg via ORAL
  Filled 2022-04-12 (×2): qty 2

## 2022-04-12 MED ORDER — IPRATROPIUM-ALBUTEROL 0.5-2.5 (3) MG/3ML IN SOLN
3.0000 mL | Freq: Once | RESPIRATORY_TRACT | Status: AC
  Administered 2022-04-12: 3 mL via RESPIRATORY_TRACT
  Filled 2022-04-12: qty 3

## 2022-04-12 MED ORDER — ACETAMINOPHEN 650 MG RE SUPP: 650.0000 mg | Freq: Four times a day (QID) | RECTAL | Status: DC | PRN

## 2022-04-12 MED ORDER — VANCOMYCIN HCL 750 MG/150ML IV SOLN: 750.0000 mg | INTRAVENOUS | Status: DC

## 2022-04-12 MED ORDER — IOHEXOL 350 MG/ML SOLN
75.0000 mL | Freq: Once | INTRAVENOUS | Status: AC | PRN
  Administered 2022-04-12: 75 mL via INTRAVENOUS

## 2022-04-12 NOTE — Progress Notes (Signed)
Pharmacy Antibiotic Note  Natalie West is a 88 y.o. female admitted on 04/12/2022 presenting confused, productive cough, also recent UTI.  Pharmacy has been consulted for vancomycin dosing.  Cefepime per MD  Plan: Vancomycin 1250 mg IV x 1, then 750 mg IV q 24h (eAUC 533, SCr 0.89) Add MRSA PCR Monitor renal function, Cx/PCR to narrow Vancomycin level as needed   Height: '5\' 2"'$  (157.5 cm) Weight: 59 kg (130 lb) IBW/kg (Calculated) : 50.1  Temp (24hrs), Avg:97.8 F (36.6 C), Min:97.8 F (36.6 C), Max:97.8 F (36.6 C)  No results for input(s): "WBC", "CREATININE", "LATICACIDVEN", "VANCOTROUGH", "VANCOPEAK", "VANCORANDOM", "GENTTROUGH", "GENTPEAK", "GENTRANDOM", "TOBRATROUGH", "TOBRAPEAK", "TOBRARND", "AMIKACINPEAK", "AMIKACINTROU", "AMIKACIN" in the last 168 hours.  CrCl cannot be calculated (Patient's most recent lab result is older than the maximum 21 days allowed.).    Allergies  Allergen Reactions   Ciprofloxacin Other (See Comments)    Reaction unknown   Clindamycin/Lincomycin Other (See Comments)    Reaction unknown    Codeine Nausea And Vomiting   Diclofenac Other (See Comments)    Reaction unknown    Fenoprofen Calcium Other (See Comments)    Reaction unknown    Flexeril [Cyclobenzaprine Hcl] Other (See Comments)    Reaction unknown    Hydrocodone Other (See Comments)    "Last time it was too strong and messed up my mind"   Macrobid [Nitrofurantoin] Other (See Comments)    Reaction unknown    Noroxin [Norfloxacin] Other (See Comments)    Reaction unknown    Relafen [Nabumetone] Other (See Comments)    Reaction unknown    Sulfa Antibiotics Other (See Comments)    Reaction unknown     Bertis Ruddy, PharmD Clinical Pharmacist ED Pharmacist Phone # [phone] 04/12/2022 5:38 PM

## 2022-04-12 NOTE — ED Triage Notes (Signed)
Family states patient has completed x2 courses of abx for a uti but it returns when course is complete. Pt has been confused and having hallucinations for the past 10 days.

## 2022-04-12 NOTE — H&P (Signed)
History and Physical    CLARETHA West GQQ:[id number] DOB: 1931/02/11 DOA: 04/12/2022  PCP: Natalie Sacramento, MD  Patient coming from: Home.  Chief Complaint: Hypoxia and shortness of breath.  HPI: Natalie West is a 88 y.o. female with history of atrial fibrillation not on anticoagulation with risk of falls, pulmonary hypertension, hyperlipidemia who was recently admitted to the hospital in March for C-spine and right knee fracture was discharged to rehab and recently discharged from rehab was found to be hypoxic after physical therapy came to visit the patient.  Patient also appeared to be short of breath.  ED Course: In the ER patient was hypoxic requiring 2 L oxygen chest x-ray initially showed multifocal pneumonia was started on antibiotics.  Subsequent CT angiogram of the chest done shows pulmonary embolism with features concerning for pulm hypertension.  Was started on heparin and admitted for further observation.  EKG shows A-fib rate controlled.  Review of Systems: As per HPI, rest all negative.   Past Medical History:  Diagnosis Date   Anxiety    Arthritis    Breast cancer ([place]) 2006   right breast   Chronic cough    Chronic UTI    Depression    Dysrhythmia    Esophageal reflux    Esophageal stricture    Family history of malignant neoplasm of gastrointestinal tract    GERD (gastroesophageal reflux disease)    Hearing loss    Hemorrhoids    internal   History of hiatal hernia    Hypercholesterolemia    Hypertension    Numbness and tingling    feeet, occasionally   Osteopenia    Personal history of colonic polyps 02/09/2007   TUBULAR ADENOMA   Personal history of radiation therapy    PONV (postoperative nausea and vomiting)    trouble opening mouth and jaw, jaw pops, trouble turning head   Rotator cuff tear    RT   Status post dilation of esophageal narrowing    Umbilical hernia     Past Surgical History:  Procedure Laterality Date   BREAST LUMPECTOMY  2006   right  lumpectomy   CATARACT EXTRACTION W/ INTRAOCULAR LENS IMPLANT Bilateral    COLONOSCOPY     CYSTOCELE REPAIR N/A 04/20/2016   Procedure: AUGMENTED ANTERIOR VAULT REPAIR, COLOPLAST DERMIS GRAFT KELLY PLICATION SACROSPINOUS FIXATION;  Surgeon: Carolan Clines, MD;  Location: [facility];  Service: Urology;  Laterality: N/A;   DILATION AND CURETTAGE OF UTERUS     ESOPHAGOGASTRODUODENOSCOPY ENDOSCOPY     PUBOVAGINAL SLING N/A 04/20/2016   Procedure: PUBO-VAGINAL Natalie West;  Surgeon: Carolan Clines, MD;  Location: [facility];  Service: Urology;  Laterality: N/A;   REVISION URINARY SLING N/A 06/12/2016   Procedure: INCISION OF URETHRAL SLING;  Surgeon: Carolan Clines, MD;  Location: [facility];  Service: Urology;  Laterality: N/A;  1 HOUR   SHOULDER OPEN ROTATOR CUFF REPAIR Right 02/28/2013   Procedure: RIGHT ROTATOR CUFF REPAIR SHOULDER OPEN WITH GRAFT AND ANCHORS;  Surgeon: Natalie Bastos, MD;  Location: [facility];  Service: Orthopedics;  Laterality: Right;   UMBILICAL HERNIA REPAIR       reports that she has never smoked. She has never used smokeless tobacco. She reports that she does not drink alcohol and does not use drugs.  Allergies  Allergen Reactions   Ciprofloxacin Other (See Comments)    Reaction unknown   Clindamycin/Lincomycin Other (See Comments)    Reaction unknown    Codeine Nausea And Vomiting  Diclofenac Other (See Comments)    Reaction unknown    Fenoprofen Calcium Other (See Comments)    Reaction unknown    Flexeril [Cyclobenzaprine Hcl] Other (See Comments)    Reaction unknown    Hydrocodone Other (See Comments)    "Last time it was too strong and messed up my mind"   Macrobid [Nitrofurantoin] Other (See Comments)    Reaction unknown    Noroxin [Norfloxacin] Other (See Comments)    Reaction unknown    Relafen [Nabumetone] Other (See Comments)    Reaction unknown    Sulfa Antibiotics Other (See Comments)    Reaction unknown     Family History  Problem  Relation Age of Onset   Colon cancer Mother 53   Kidney cancer Mother    Glaucoma Mother    Heart disease Sister    Stomach cancer Brother    Stroke Sister    Lung cancer Brother    Clotting disorder Son    Emphysema Father        smoker   Hypertension Sister     Prior to Admission medications   Medication Sig Start Date End Date Taking? Authorizing Provider  acetaminophen (TYLENOL) 325 MG tablet Take 2 tablets (650 mg total) by mouth every 6 (six) hours as needed. 01/28/22   Maczis, Barth Kirks, PA-C  amitriptyline (ELAVIL) 10 MG tablet Take 25 mg by mouth 2 (two) times daily. 10/23/18   [provider]  amitriptyline (ELAVIL) 25 MG tablet Take by mouth. 03/29/22   [provider]  aspirin EC 81 MG tablet Take 81 mg by mouth daily.    [provider]  atenolol (TENORMIN) 25 MG tablet Take 0.5 tablets (12.5 mg total) by mouth daily. Patient taking differently: Take 12.5 mg by mouth at bedtime. 01/23/20   Troy Sine, MD  Calcium Carb-Cholecalciferol (CALCIUM 600 + D PO) Take 1 tablet by mouth daily.    [provider]  furosemide (LASIX) 20 MG tablet Take by mouth. 03/29/22   [provider]  furosemide (LASIX) 40 MG tablet Take 1 tablet (40 mg total) by mouth daily. '20MG'$  AND '40MG'$  ALTERNATING DAILY Patient taking differently: Take 20 mg by mouth daily. 11/06/21 02/04/22  Troy Sine, MD  hydrocortisone 2.5 % ointment Apply 2.5 mg topically as needed (for pain). 03/24/21   [provider]  lidocaine (LIDODERM) 5 % Place 2 patches onto the skin daily. Remove & Discard patch within 12 hours or as directed by MD 01/28/22   Maczis, Barth Kirks, PA-C  linezolid (ZYVOX) 600 MG tablet Take 600 mg by mouth 2 (two) times daily. 04/06/22   [provider]  mirtazapine (REMERON) 15 MG tablet Take 15 mg by mouth at bedtime. 03/30/22   [provider]  Multiple Vitamin (MULTIVITAMIN WITH MINERALS) TABS tablet Take 1 tablet by mouth daily.  Centrum Silver for Women    [provider]  pantoprazole (PROTONIX) 40 MG tablet Take 40 mg by mouth daily. 1 Tablet Daily for Heartburn and Acid Reflux    [provider]  perphenazine (TRILAFON) 2 MG tablet Take 2 mg by mouth 2 (two) times daily.  12/01/18   [provider]  polyethylene glycol (MIRALAX / GLYCOLAX) 17 g packet Take 17 g by mouth 2 (two) times daily as needed for mild constipation. 01/28/22   Maczis, Barth Kirks, PA-C  potassium chloride SA (KLOR-CON M20) 20 MEQ tablet Take 1 tablet (20 mEq total) by mouth daily. 05/09/21 01/22/23  Caron Presume K, PA-C  pravastatin (PRAVACHOL) 20 MG tablet Take 20 mg by mouth at bedtime.     [provider]  senna-docusate (SENOKOT-S) 8.6-50 MG tablet Take 1 tablet by mouth 2 (two) times daily as needed for mild constipation. 01/28/22   Maczis, Barth Kirks, PA-C  sertraline (ZOLOFT) 25 MG tablet Take 25 mg by mouth daily. 03/29/22   [provider]  traMADol (ULTRAM) 50 MG tablet Take 1 tablet (50 mg total) by mouth every 6 (six) hours as needed for severe pain. 01/28/22   Maczis, Barth Kirks, PA-C    Physical Exam: Constitutional: Moderately built and nourished. Vitals:   04/12/22 1630 04/12/22 1645 04/12/22 1900 04/12/22 2030  BP: 106/72 98/68 120/85 112/74  Pulse: 89 87 87 94  Resp: (!) 33 (!) 26 (!) 24 19  Temp:      TempSrc:      SpO2: 94% 98% 100% 96%  Weight:      Height:       Eyes: Anicteric no pallor. ENMT: No discharge from the ears eyes nose and mouth. Neck: No mass felt.  No neck rigidity. Respiratory: No rhonchi or crepitations. Cardiovascular: S1-S2 heard. Abdomen: Soft nontender bowel sound present. Musculoskeletal: No edema. Skin: No rash. Neurologic: Alert awake oriented time place and person.  Moves all extremities. Psychiatric: Appears normal.  Normal affect.   Labs on Admission: I have personally reviewed following labs and imaging studies  CBC: Recent Labs  Lab  04/12/22 1641  WBC 12.6*  NEUTROABS 10.0*  HGB 11.8*  HCT 38.1  MCV 102.7*  PLT 182   Basic Metabolic Panel: Recent Labs  Lab 04/12/22 1641  NA 139  K 3.7  CL 108  CO2 24  GLUCOSE 110*  BUN 20  CREATININE 0.89  CALCIUM 8.3*   GFR: Estimated Creatinine Clearance: 34.6 mL/min (by C-G formula based on SCr of 0.89 mg/dL). Liver Function Tests: Recent Labs  Lab 04/12/22 1641  AST 18  ALT 16  ALKPHOS 83  BILITOT 0.7  PROT 6.9  ALBUMIN 3.1*   No results for input(s): "LIPASE", "AMYLASE" in the last 168 hours. No results for input(s): "AMMONIA" in the last 168 hours. Coagulation Profile: Recent Labs  Lab 04/12/22 1641  INR 1.4*   Cardiac Enzymes: No results for input(s): "CKTOTAL", "CKMB", "CKMBINDEX", "TROPONINI" in the last 168 hours. BNP (last 3 results) No results for input(s): "PROBNP" in the last 8760 hours. HbA1C: No results for input(s): "HGBA1C" in the last 72 hours. CBG: No results for input(s): "GLUCAP" in the last 168 hours. Lipid Profile: No results for input(s): "CHOL", "HDL", "LDLCALC", "TRIG", "CHOLHDL", "LDLDIRECT" in the last 72 hours. Thyroid Function Tests: No results for input(s): "TSH", "T4TOTAL", "FREET4", "T3FREE", "THYROIDAB" in the last 72 hours. Anemia Panel: No results for input(s): "VITAMINB12", "FOLATE", "FERRITIN", "TIBC", "IRON", "RETICCTPCT" in the last 72 hours. Urine analysis:    Component Value Date/Time   COLORURINE YELLOW 04/12/2022 1641   APPEARANCEUR HAZY (A) 04/12/2022 1641   LABSPEC 1.014 04/12/2022 1641   PHURINE 5.0 04/12/2022 1641   GLUCOSEU NEGATIVE 04/12/2022 1641   HGBUR SMALL (A) 04/12/2022 1641   BILIRUBINUR NEGATIVE 04/12/2022 1641   BILIRUBINUR n 02/18/2015 1404   KETONESUR NEGATIVE 04/12/2022 1641   PROTEINUR NEGATIVE 04/12/2022 1641   UROBILINOGEN negative 02/18/2015 1404   UROBILINOGEN 0.2 02/28/2013 0739   NITRITE NEGATIVE 04/12/2022 1641   LEUKOCYTESUR MODERATE (A) 04/12/2022 1641   Sepsis  Labs: '@LABRCNTIP'$ (procalcitonin:4,lacticidven:4) ) Recent Results (from the past 240 hour(s))  Resp Panel by RT-PCR (Flu A&B, Covid)     Status: None   Collection Time: 04/12/22  5:31 PM   Specimen: Nasal Swab  Result Value Ref Range Status   SARS Coronavirus 2 by RT PCR NEGATIVE NEGATIVE Final    Comment: (NOTE) SARS-CoV-2 target nucleic acids are NOT DETECTED.  The SARS-CoV-2 RNA is generally detectable in upper respiratory specimens during the acute phase of infection. The lowest concentration of SARS-CoV-2 viral copies this assay can detect is 138 copies/mL. A negative result does not preclude SARS-Cov-2 infection and should not be used as the sole basis for treatment or other patient management decisions. A negative result may occur with  improper specimen collection/handling, submission of specimen other than nasopharyngeal swab, presence of viral mutation(s) within the areas targeted by this assay, and inadequate number of viral copies(<138 copies/mL). A negative result must be combined with clinical observations, patient history, and epidemiological information. The expected result is Negative.  Fact Sheet for Patients:  EntrepreneurPulse.com.au  Fact Sheet for Healthcare Providers:  IncredibleEmployment.be  This test is no t yet approved or cleared by the [place] FDA and  has been authorized for detection and/or diagnosis of SARS-CoV-2 by FDA under an Emergency Use Authorization (EUA). This EUA will remain  in effect (meaning this test can be used) for the duration of the COVID-19 declaration under Section 564(b)(1) of the Act, 21 U.S.C.section 360bbb-3(b)(1), unless the authorization is terminated  or revoked sooner.       Influenza A by PCR NEGATIVE NEGATIVE Final   Influenza B by PCR NEGATIVE NEGATIVE Final    Comment: (NOTE) The Xpert Xpress SARS-CoV-2/FLU/RSV plus assay is intended as an aid in the diagnosis of  influenza from Nasopharyngeal swab specimens and should not be used as a sole basis for treatment. Nasal washings and aspirates are unacceptable for Xpert Xpress SARS-CoV-2/FLU/RSV testing.  Fact Sheet for Patients: EntrepreneurPulse.com.au  Fact Sheet for Healthcare Providers: IncredibleEmployment.be  This test is not yet approved or cleared by the [place] FDA and has been authorized for detection and/or diagnosis of SARS-CoV-2 by FDA under an Emergency Use Authorization (EUA). This EUA will remain in effect (meaning this test can be used) for the duration of the COVID-19 declaration under Section 564(b)(1) of the Act, 21 U.S.C. section 360bbb-3(b)(1), unless the authorization is terminated or revoked.  Performed at [facility], [place] [address], [place], [place] [address]      Radiological Exams on Admission: CT Angio Chest Pulmonary Embolism (PE) W or WO Contrast  Result Date: 04/12/2022 CLINICAL DATA:  Pulmonary embolism (PE) suspected, high prob. Cough. Difficulty breathing. EXAM: CT ANGIOGRAPHY CHEST WITH CONTRAST TECHNIQUE: Multidetector CT imaging of the chest was performed using the standard protocol during bolus administration of intravenous contrast. Multiplanar CT image reconstructions and MIPs were obtained to evaluate the vascular anatomy. RADIATION DOSE REDUCTION: This exam was performed according to the departmental dose-optimization program which includes automated exposure control, adjustment of the mA and/or kV according to patient size and/or use of iterative reconstruction technique. CONTRAST:  59m OMNIPAQUE IOHEXOL 350 MG/ML SOLN COMPARISON:  01/21/2022 FINDINGS: Cardiovascular: Filling defects in the right lower lobe pulmonary arteries compatible with pulmonary emboli. No evidence of right heart strain. Cardiomegaly. Prominent central pulmonary arteries suggest pulmonary arterial hypertension. Coronary artery and aortic  calcifications. No aneurysm. Mediastinum/Nodes: No mediastinal, hilar, or axillary adenopathy. Trachea and esophagus are unremarkable. Thyroid unremarkable. Lungs/Pleura: Centrilobular emphysema. Ground-glass opacities within the upper lung zones. No confluent opacities or  effusions. Upper Abdomen: No acute findings Musculoskeletal: Chest wall soft tissues are unremarkable. No acute bony abnormality. Review of the MIP images confirms the above findings. IMPRESSION: Positive for acute pulmonary emboli in the right lower lobe. Cardiomegaly, coronary artery disease. Prominent central pulmonary arteries suggest pulmonary arterial hypertension. Aortic Atherosclerosis (ICD10-I70.0) and Emphysema (ICD10-J43.9). Electronically Signed   By: Rolm Baptise M.D.   On: 04/12/2022 22:20   DG Chest Port 1 View  Result Date: 04/12/2022 CLINICAL DATA:  Family states patient has completed x2 courses of abx for a uti but it returns when course is complete. Pt has been confused and having hallucinations for the past 10 days. EXAM: PORTABLE CHEST 1 VIEW COMPARISON:  CT, 01/21/2022.  Chest radiograph, 12/06/2021. FINDINGS: Cardiac silhouette borderline enlarged. No mediastinal or hilar masses. Low lung volumes. Coarse bilateral interstitial thickening. Intervening hazy airspace opacities no left mid and upper lung and right upper lung, new compared to the prior radiographs. No convincing pleural effusion.  No pneumothorax. Skeletal structures are grossly intact. IMPRESSION: 1. Hazy bilateral airspace lung opacities superimposed on chronic interstitial thickening. Findings suspicious for multifocal pneumonia. Electronically Signed   By: Lajean Manes M.D.   On: 04/12/2022 17:22    EKG: Independently reviewed.  A-fib rate controlled.  Assessment/Plan Principal Problem:   Acute pulmonary embolism (HCC) Active Problems:   Hyperlipidemia   Breast cancer of lower-outer quadrant of right female breast ([place])   Acute diastolic heart  failure ([place])    Acute pulm embolism with no strain pattern likely could be provoked by patient's decreased ambulation as reported.  We will check Dopplers of the lower extremities to rule out DVT.  Check 2D echo.  Check cardiac markers and BNP.  Presently on IV heparin.  If patient remains stable will be hemodynamically stable change to oral anticoagulants. Acute respiratory failure with hypoxia requiring 2 L oxygen likely from pulmonary embolism and also some contribution from pulmonary hypertension.  Follow 2D echo.  Continue to monitor. History of A-fib on atenolol.  Not on anticoagulation previously due to risk of falls.  Present on heparin for PE. Recent admission for C-spine fracture and right patella fracture managed conservatively. History of hyperlipidemia on statins. Prior history of breast cancer in remission.  All home medication needs to be verified and ordered accordingly.   DVT prophylaxis: Heparin. Code Status: Full code. Family Communication: We will need to discuss with family. Disposition Plan: To be determined. Consults called: None. Admission status: Observation.   Rise Patience MD [facility] Pager [phone].  If 7PM-7AM, please contact night-coverage [email or web address] Password [facility]  04/12/2022, 10:36 PM

## 2022-04-12 NOTE — ED Provider Notes (Signed)
[facility] EMERGENCY DEPARTMENT Provider Note   CSN: [id number] Arrival date & time: 04/12/22  1617     History  Chief Complaint  Patient presents with   Altered Mental Status    Natalie West is a 88 y.o. female.  88 year old female brought in by EMS from home with family concern for UTI, EMS reports patient has recently completed 2 courses of antibiotics however historically, whenever she completes round of antibiotics she has an infection again.  Family had reported patient was confused and having hallucinations for the past 10 days.  Patient appears alert, she is able to tell me name, location and states that it is 2024.  She states that she does have some dysuria, recently arrived back home after a stay in a nursing facility.  Also complains of cough which is productive with white sputum. She denies abdominal pain, chest pain, difficulty breathing although notes that she does at times have difficulty breathing.       Home Medications Prior to Admission medications   Medication Sig Start Date End Date Taking? Authorizing Provider  acetaminophen (TYLENOL) 325 MG tablet Take 2 tablets (650 mg total) by mouth every 6 (six) hours as needed. 01/28/22   Maczis, Barth Kirks, PA-C  amitriptyline (ELAVIL) 10 MG tablet Take 25 mg by mouth 2 (two) times daily. 10/23/18   [provider]  amitriptyline (ELAVIL) 25 MG tablet Take by mouth. 03/29/22   [provider]  aspirin EC 81 MG tablet Take 81 mg by mouth daily.    [provider]  atenolol (TENORMIN) 25 MG tablet Take 0.5 tablets (12.5 mg total) by mouth daily. Patient taking differently: Take 12.5 mg by mouth at bedtime. 01/23/20   Troy Sine, MD  Calcium Carb-Cholecalciferol (CALCIUM 600 + D PO) Take 1 tablet by mouth daily.    [provider]  furosemide (LASIX) 20 MG tablet Take by mouth. 03/29/22   [provider]  furosemide (LASIX) 40 MG tablet Take 1 tablet (40 mg  total) by mouth daily. '20MG'$  AND '40MG'$  ALTERNATING DAILY Patient taking differently: Take 20 mg by mouth daily. 11/06/21 02/04/22  Troy Sine, MD  hydrocortisone 2.5 % ointment Apply 2.5 mg topically as needed (for pain). 03/24/21   [provider]  lidocaine (LIDODERM) 5 % Place 2 patches onto the skin daily. Remove & Discard patch within 12 hours or as directed by MD 01/28/22   Maczis, Barth Kirks, PA-C  linezolid (ZYVOX) 600 MG tablet Take 600 mg by mouth 2 (two) times daily. 04/06/22   [provider]  mirtazapine (REMERON) 15 MG tablet Take 15 mg by mouth at bedtime. 03/30/22   [provider]  Multiple Vitamin (MULTIVITAMIN WITH MINERALS) TABS tablet Take 1 tablet by mouth daily. Centrum Silver for Women    [provider]  pantoprazole (PROTONIX) 40 MG tablet Take 40 mg by mouth daily. 1 Tablet Daily for Heartburn and Acid Reflux    [provider]  perphenazine (TRILAFON) 2 MG tablet Take 2 mg by mouth 2 (two) times daily.  12/01/18   [provider]  polyethylene glycol (MIRALAX / GLYCOLAX) 17 g packet Take 17 g by mouth 2 (two) times daily as needed for mild constipation. 01/28/22   Maczis, Barth Kirks, PA-C  potassium chloride SA (KLOR-CON M20) 20 MEQ tablet Take 1 tablet (20 mEq total) by mouth daily. 05/09/21 01/22/23  Warren Lacy, PA-C  pravastatin (PRAVACHOL) 20 MG tablet Take 20 mg  by mouth at bedtime.     [provider]  senna-docusate (SENOKOT-S) 8.6-50 MG tablet Take 1 tablet by mouth 2 (two) times daily as needed for mild constipation. 01/28/22   Maczis, Barth Kirks, PA-C  sertraline (ZOLOFT) 25 MG tablet Take 25 mg by mouth daily. 03/29/22   [provider]  traMADol (ULTRAM) 50 MG tablet Take 1 tablet (50 mg total) by mouth every 6 (six) hours as needed for severe pain. 01/28/22   Maczis, Barth Kirks, PA-C      Allergies    Ciprofloxacin, Clindamycin/lincomycin, Codeine, Diclofenac, Fenoprofen calcium, Flexeril  [cyclobenzaprine hcl], Hydrocodone, Macrobid [nitrofurantoin], Noroxin [norfloxacin], Relafen [nabumetone], and Sulfa antibiotics    Review of Systems   Review of Systems Negative except as per HPI Physical Exam Updated Vital Signs BP 120/85   Pulse 87   Temp 97.8 F (36.6 C) (Oral)   Resp (!) 24   Ht '5\' 2"'$  (1.575 m)   Wt 59 kg   LMP 11/03/1983   SpO2 100%   BMI 23.78 kg/m  Physical Exam Vitals and nursing note reviewed.  Constitutional:      General: She is not in acute distress.    Appearance: She is well-developed. She is not diaphoretic.  HENT:     Head: Normocephalic and atraumatic.     Mouth/Throat:     Mouth: Mucous membranes are moist.  Eyes:     Pupils: Pupils are equal, round, and reactive to light.  Cardiovascular:     Rate and Rhythm: Normal rate. Rhythm irregular.     Pulses: Normal pulses.     Heart sounds: Normal heart sounds.  Pulmonary:     Effort: Tachypnea present.     Breath sounds: Normal breath sounds.  Abdominal:     Palpations: Abdomen is soft.     Tenderness: There is no abdominal tenderness.  Musculoskeletal:     Cervical back: No tenderness.     Right lower leg: No edema.     Left lower leg: No edema.  Skin:    General: Skin is warm and dry.     Comments: Pressure wounds as pictured to rectum and mid t-spine  Neurological:     Mental Status: She is alert and oriented to person, place, and time.  Psychiatric:        Behavior: Behavior normal.        ED Results / Procedures / Treatments   Labs (all labs ordered are listed, but only abnormal results are displayed) Labs Reviewed  COMPREHENSIVE METABOLIC PANEL - Abnormal; Notable for the following components:      Result Value   Glucose, Bld 110 (*)    Calcium 8.3 (*)    Albumin 3.1 (*)    All other components within normal limits  CBC WITH DIFFERENTIAL/PLATELET - Abnormal; Notable for the following components:   WBC 12.6 (*)    RBC 3.71 (*)    Hemoglobin 11.8 (*)    MCV  102.7 (*)    Neutro Abs 10.0 (*)    Eosinophils Absolute 0.6 (*)    Abs Immature Granulocytes 0.25 (*)    All other components within normal limits  PROTIME-INR - Abnormal; Notable for the following components:   Prothrombin Time 17.1 (*)    INR 1.4 (*)    All other components within normal limits  URINALYSIS, ROUTINE W REFLEX MICROSCOPIC - Abnormal; Notable for the following components:   APPearance HAZY (*)    Hgb urine dipstick SMALL (*)  Leukocytes,Ua MODERATE (*)    WBC, UA >50 (*)    Bacteria, UA MANY (*)    All other components within normal limits  RESP PANEL BY RT-PCR (FLU A&B, COVID) ARPGX2  CULTURE, BLOOD (ROUTINE X 2)  CULTURE, BLOOD (ROUTINE X 2)  URINE CULTURE  MRSA NEXT GEN BY PCR, NASAL  LACTIC ACID, PLASMA  APTT  LACTIC ACID, PLASMA    EKG None  Radiology DG Chest Port 1 View  Result Date: 04/12/2022 CLINICAL DATA:  Family states patient has completed x2 courses of abx for a uti but it returns when course is complete. Pt has been confused and having hallucinations for the past 10 days. EXAM: PORTABLE CHEST 1 VIEW COMPARISON:  CT, 01/21/2022.  Chest radiograph, 12/06/2021. FINDINGS: Cardiac silhouette borderline enlarged. No mediastinal or hilar masses. Low lung volumes. Coarse bilateral interstitial thickening. Intervening hazy airspace opacities no left mid and upper lung and right upper lung, new compared to the prior radiographs. No convincing pleural effusion.  No pneumothorax. Skeletal structures are grossly intact. IMPRESSION: 1. Hazy bilateral airspace lung opacities superimposed on chronic interstitial thickening. Findings suspicious for multifocal pneumonia. Electronically Signed   By: Lajean Manes M.D.   On: 04/12/2022 17:22    Procedures Procedures    Medications Ordered in ED Medications  vancomycin (VANCOREADY) IVPB 750 mg/150 mL (has no administration in time range)  ipratropium-albuterol (DUONEB) 0.5-2.5 (3) MG/3ML nebulizer solution 3 mL (3  mLs Nebulization Given 04/12/22 1751)  ceFEPIme (MAXIPIME) 2 g in sodium chloride 0.9 % 100 mL IVPB (0 g Intravenous Stopped 04/12/22 1850)  vancomycin (VANCOREADY) IVPB 1250 mg/250 mL (1,250 mg Intravenous New Bag/Given 04/12/22 1852)    ED Course/ Medical Decision Making/ A&P                           Medical Decision Making Amount and/or Complexity of Data Reviewed Labs: ordered. Radiology: ordered. ECG/medicine tests: ordered.  Risk Prescription drug management.   This patient presents to the ED for concern of confusion/hallucinations per EMS... cough, O2 sat 89% with home health per pts nephew/caretaker, this involves an extensive number of treatment options, and is a complaint that carries with it a high risk of complications and morbidity.  The differential diagnosis includes but not limited to pna, UTI, COVID/flu, sepsis, hypoxia   Co morbidities that complicate the patient evaluation  Chronic a fib, hyperlipidemia, HTN, chronic UTI (most recently treated with linezolid)   Additional history obtained:  Additional history obtained from EMS as above Additional history for patient's nephew Barbee Cough (patient lives with nephew), states patient's home health nurse was concerned about her wheezing and O2 sat of 89% which prompted call to 911. States patient has been confused off and on for the past 7-10 days which commonly happens when she has a UTI, finished her antibiotics today for UTI. External records from outside source obtained and reviewed including dc summary from admission 01/21/22-01/28/22 for fall resulting in C1-C2 fx, patella fx, chronic a fib EF 55-60% on 06/2021 echo   Lab Tests:  I Ordered, and personally interpreted labs.  The pertinent results include: CBC with mild leukocytosis with WBC 12.6, increase in neutrophils.  Lactic acid 1.9.  Urinalysis with moderate leukocytes, greater than 50 white cells with many bacteria.  CMP without significant findings.  COVID, flu  negative.   Imaging Studies ordered:  I ordered imaging studies including chest x-ray I independently visualized and interpreted imaging which showed  multifocal pneumonia I agree with the radiologist interpretation   Cardiac Monitoring: / EKG:  The patient was maintained on a cardiac monitor.  I personally viewed and interpreted the cardiac monitored which showed an underlying rhythm of: A-fib, rate controlled   Consultations Obtained:  I requested consultation with the hospitalist, Dr. Hal Hope,  and discussed lab and imaging findings as well as pertinent plan - they recommend: Admission   Problem List / ED Course / Critical interventions / Medication management  88 year old female brought in by EMS from home, dc from SNF 3 days ago. Reports cough, dysuria, ?confusion (per EMS hallucinating, however patient alert and oriented on arrival). Found to be tachypneic with resp rate as high as 40s, O2 sat fluctuates from 88%-96%, placed on 2L [place] with ease of respiratory rate and maintaining O2 sats in the 90s. Multifocal PNA on CXR, started on abx with concern for HCAP having just left SNF and recently completed abx for UTI. Slight WBC elevation with increase in neutrophils, possible UTI. COVID/flu negative. Updated nephew via telephone, nephew on the way to the ER. Discussed with hospitalist who will admit.  I ordered medication including abx, O2  for PNA, hypoxia   Reevaluation of the patient after these medicines showed that the patient improved I have reviewed the patients home medicines and have made adjustments as needed   Social Determinants of Health:  Recently dc from SNF to live with nephew   Test / Admission - Considered:  Admit for multifocal pna, possible UTI (symptomatic, UA positive, sent for cx)         Final Clinical Impression(s) / ED Diagnoses Final diagnoses:  Healthcare-associated pneumonia  Urinary tract infection in female    Rx / DC Orders ED  Discharge Orders     None         Roque Lias 04/12/22 2024    Davonna Belling, MD 04/13/22 0040

## 2022-04-12 NOTE — Progress Notes (Signed)
ANTICOAGULATION CONSULT NOTE - Initial Consult  Pharmacy Consult for heparin Indication: pulmonary embolus  Allergies  Allergen Reactions   Ciprofloxacin Other (See Comments)    Reaction unknown   Clindamycin/Lincomycin Other (See Comments)    Reaction unknown    Codeine Nausea And Vomiting   Diclofenac Other (See Comments)    Reaction unknown    Fenoprofen Calcium Other (See Comments)    Reaction unknown    Flexeril [Cyclobenzaprine Hcl] Other (See Comments)    Reaction unknown    Hydrocodone Other (See Comments)    "Last time it was too strong and messed up my mind"   Macrobid [Nitrofurantoin] Other (See Comments)    Reaction unknown    Noroxin [Norfloxacin] Other (See Comments)    Reaction unknown    Relafen [Nabumetone] Other (See Comments)    Reaction unknown    Sulfa Antibiotics Other (See Comments)    Reaction unknown     Patient Measurements: Height: '5\' 2"'$  (157.5 cm) Weight: 59 kg (130 lb) IBW/kg (Calculated) : 50.1 Heparin Dosing Weight: 59 Kg  Vital Signs: Temp: 97.8 F (36.6 C) (06/11 1629) Temp Source: Oral (06/11 1629) BP: 112/74 (06/11 2030) Pulse Rate: 94 (06/11 2030)  Labs: Recent Labs    04/12/22 1641  HGB 11.8*  HCT 38.1  PLT 197  APTT 32  LABPROT 17.1*  INR 1.4*  CREATININE 0.89    Estimated Creatinine Clearance: 34.6 mL/min (by C-G formula based on SCr of 0.89 mg/dL).   Medical History: Past Medical History:  Diagnosis Date   Anxiety    Arthritis    Breast cancer ([place]) 2006   right breast   Chronic cough    Chronic UTI    Depression    Dysrhythmia    Esophageal reflux    Esophageal stricture    Family history of malignant neoplasm of gastrointestinal tract    GERD (gastroesophageal reflux disease)    Hearing loss    Hemorrhoids    internal   History of hiatal hernia    Hypercholesterolemia    Hypertension    Numbness and tingling    feeet, occasionally   Osteopenia    Personal history of colonic polyps  02/09/2007   TUBULAR ADENOMA   Personal history of radiation therapy    PONV (postoperative nausea and vomiting)    trouble opening mouth and jaw, jaw pops, trouble turning head   Rotator cuff tear    RT   Status post dilation of esophageal narrowing    Umbilical hernia     Assessment: 44 yoF with altered mental status found to have PE. No anticoagulation reported prior to admission. Hgb 11.8, PLT WNL  Goal of Therapy:  Heparin level 0.3-0.7 units/ml Monitor platelets by anticoagulation protocol: Yes   Plan:  Give 3500 units bolus x 1 Start heparin infusion at 950 units/hr Check anti-Xa level in 8 hours and daily while on heparin Continue to monitor H&H and platelets  Georga Bora, PharmD Clinical Pharmacist 04/12/2022 11:35 PM Please check AMION for all [place] numbers

## 2022-04-12 NOTE — Progress Notes (Incomplete)
ANTICOAGULATION CONSULT NOTE - Initial Consult  Pharmacy Consult for heparin Indication: pulmonary embolus  Allergies  Allergen Reactions  . Ciprofloxacin Other (See Comments)    Reaction unknown  . Clindamycin/Lincomycin Other (See Comments)    Reaction unknown   . Codeine Nausea And Vomiting  . Diclofenac Other (See Comments)    Reaction unknown   . Fenoprofen Calcium Other (See Comments)    Reaction unknown   . Flexeril [Cyclobenzaprine Hcl] Other (See Comments)    Reaction unknown   . Hydrocodone Other (See Comments)    "Last time it was too strong and messed up my mind"  . Macrobid [Nitrofurantoin] Other (See Comments)    Reaction unknown   . Noroxin [Norfloxacin] Other (See Comments)    Reaction unknown   . Relafen [Nabumetone] Other (See Comments)    Reaction unknown   . Sulfa Antibiotics Other (See Comments)    Reaction unknown     Patient Measurements: Height: '5\' 2"'$  (157.5 cm) Weight: 59 kg (130 lb) IBW/kg (Calculated) : 50.1 Heparin Dosing Weight: 59 Kg  Vital Signs: Temp: 97.8 F (36.6 C) (06/11 1629) Temp Source: Oral (06/11 1629) BP: 112/74 (06/11 2030) Pulse Rate: 94 (06/11 2030)  Labs: Recent Labs    04/12/22 1641  HGB 11.8*  HCT 38.1  PLT 197  APTT 32  LABPROT 17.1*  INR 1.4*  CREATININE 0.89    Estimated Creatinine Clearance: 34.6 mL/min (by C-G formula based on SCr of 0.89 mg/dL).   Medical History: Past Medical History:  Diagnosis Date  . Anxiety   . Arthritis   . Breast cancer ([place]) 2006   right breast  . Chronic cough   . Chronic UTI   . Depression   . Dysrhythmia   . Esophageal reflux   . Esophageal stricture   . Family history of malignant neoplasm of gastrointestinal tract   . GERD (gastroesophageal reflux disease)   . Hearing loss   . Hemorrhoids    internal  . History of hiatal hernia   . Hypercholesterolemia   . Hypertension   . Numbness and tingling    feeet, occasionally  . Osteopenia   . Personal  history of colonic polyps 02/09/2007   TUBULAR ADENOMA  . Personal history of radiation therapy   . PONV (postoperative nausea and vomiting)    trouble opening mouth and jaw, jaw pops, trouble turning head  . Rotator cuff tear    RT  . Status post dilation of esophageal narrowing   . Umbilical hernia     Assessment: 6 yoF with altered mental status found to have PE. No anticoagulation reported prior to admission. Hgb 11.8, PLT WNL  Goal of Therapy:  Heparin level 0.3-0.7 units/ml Monitor platelets by anticoagulation protocol: Yes   Plan:  Give 3500 units bolus x 1 Start heparin infusion at 950 units/hr Check anti-Xa level in 8 hours and daily while on heparin Continue to monitor H&H and platelets  Georga Bora, PharmD Clinical Pharmacist 04/12/2022 11:35 PM Please check AMION for all [place] numbers

## 2022-04-13 ENCOUNTER — Observation Stay (HOSPITAL_COMMUNITY): Payer: Medicare HMO

## 2022-04-13 DIAGNOSIS — C50511 Malignant neoplasm of lower-outer quadrant of right female breast: Secondary | ICD-10-CM | POA: Diagnosis not present

## 2022-04-13 DIAGNOSIS — F419 Anxiety disorder, unspecified: Secondary | ICD-10-CM | POA: Diagnosis present

## 2022-04-13 DIAGNOSIS — Z7189 Other specified counseling: Secondary | ICD-10-CM

## 2022-04-13 DIAGNOSIS — B961 Klebsiella pneumoniae [K. pneumoniae] as the cause of diseases classified elsewhere: Secondary | ICD-10-CM | POA: Diagnosis present

## 2022-04-13 DIAGNOSIS — E785 Hyperlipidemia, unspecified: Secondary | ICD-10-CM | POA: Diagnosis not present

## 2022-04-13 DIAGNOSIS — R0602 Shortness of breath: Secondary | ICD-10-CM | POA: Diagnosis not present

## 2022-04-13 DIAGNOSIS — R627 Adult failure to thrive: Secondary | ICD-10-CM | POA: Diagnosis present

## 2022-04-13 DIAGNOSIS — H9190 Unspecified hearing loss, unspecified ear: Secondary | ICD-10-CM | POA: Diagnosis present

## 2022-04-13 DIAGNOSIS — Z66 Do not resuscitate: Secondary | ICD-10-CM | POA: Diagnosis present

## 2022-04-13 DIAGNOSIS — N390 Urinary tract infection, site not specified: Secondary | ICD-10-CM | POA: Diagnosis present

## 2022-04-13 DIAGNOSIS — I82413 Acute embolism and thrombosis of femoral vein, bilateral: Secondary | ICD-10-CM | POA: Diagnosis present

## 2022-04-13 DIAGNOSIS — I272 Pulmonary hypertension, unspecified: Secondary | ICD-10-CM | POA: Diagnosis present

## 2022-04-13 DIAGNOSIS — R4182 Altered mental status, unspecified: Secondary | ICD-10-CM | POA: Diagnosis present

## 2022-04-13 DIAGNOSIS — I82432 Acute embolism and thrombosis of left popliteal vein: Secondary | ICD-10-CM | POA: Diagnosis present

## 2022-04-13 DIAGNOSIS — J9601 Acute respiratory failure with hypoxia: Secondary | ICD-10-CM | POA: Diagnosis present

## 2022-04-13 DIAGNOSIS — I5031 Acute diastolic (congestive) heart failure: Secondary | ICD-10-CM

## 2022-04-13 DIAGNOSIS — E876 Hypokalemia: Secondary | ICD-10-CM | POA: Diagnosis not present

## 2022-04-13 DIAGNOSIS — I2699 Other pulmonary embolism without acute cor pulmonale: Secondary | ICD-10-CM | POA: Diagnosis present

## 2022-04-13 DIAGNOSIS — Z6822 Body mass index (BMI) 22.0-22.9, adult: Secondary | ICD-10-CM | POA: Diagnosis not present

## 2022-04-13 DIAGNOSIS — I482 Chronic atrial fibrillation, unspecified: Secondary | ICD-10-CM | POA: Diagnosis present

## 2022-04-13 DIAGNOSIS — L89152 Pressure ulcer of sacral region, stage 2: Secondary | ICD-10-CM | POA: Diagnosis present

## 2022-04-13 DIAGNOSIS — R64 Cachexia: Secondary | ICD-10-CM | POA: Diagnosis present

## 2022-04-13 DIAGNOSIS — K5900 Constipation, unspecified: Secondary | ICD-10-CM | POA: Diagnosis not present

## 2022-04-13 DIAGNOSIS — E7800 Pure hypercholesterolemia, unspecified: Secondary | ICD-10-CM | POA: Diagnosis present

## 2022-04-13 DIAGNOSIS — I110 Hypertensive heart disease with heart failure: Secondary | ICD-10-CM | POA: Diagnosis present

## 2022-04-13 DIAGNOSIS — R443 Hallucinations, unspecified: Secondary | ICD-10-CM | POA: Diagnosis present

## 2022-04-13 DIAGNOSIS — Z1152 Encounter for screening for COVID-19: Secondary | ICD-10-CM | POA: Diagnosis not present

## 2022-04-13 DIAGNOSIS — F32A Depression, unspecified: Secondary | ICD-10-CM | POA: Diagnosis present

## 2022-04-13 DIAGNOSIS — L89102 Pressure ulcer of unspecified part of back, stage 2: Secondary | ICD-10-CM | POA: Diagnosis present

## 2022-04-13 LAB — COMPREHENSIVE METABOLIC PANEL
ALT: 10 U/L (ref 0–44)
AST: 18 U/L (ref 15–41)
Albumin: 2.7 g/dL — ABNORMAL LOW (ref 3.5–5.0)
Alkaline Phosphatase: 69 U/L (ref 38–126)
Anion gap: 8 (ref 5–15)
BUN: 20 mg/dL (ref 8–23)
CO2: 21 mmol/L — ABNORMAL LOW (ref 22–32)
Calcium: 8.1 mg/dL — ABNORMAL LOW (ref 8.9–10.3)
Chloride: 110 mmol/L (ref 98–111)
Creatinine, Ser: 0.8 mg/dL (ref 0.44–1.00)
GFR, Estimated: >60 mL/min (ref >60)
Glucose, Bld: 104 mg/dL — ABNORMAL HIGH (ref 70–99)
Potassium: 3.4 mmol/L — ABNORMAL LOW (ref 3.5–5.1)
Sodium: 139 mmol/L (ref 135–145)
Total Bilirubin: 1.1 mg/dL (ref 0.3–1.2)
Total Protein: 5.8 g/dL — ABNORMAL LOW (ref 6.5–8.1)

## 2022-04-13 LAB — ECHOCARDIOGRAM COMPLETE
AR max vel: 2.24 cm2
AV Area VTI: 1.98 cm2
AV Area mean vel: 2.09 cm2
AV Mean grad: 3 mmHg
AV Peak grad: 4.8 mmHg
AV Vena cont: 0.4 cm
Ao pk vel: 1.1 m/s
Area-P 1/2: 4.49 cm2
Height: 62 in
P 1/2 time: 528 msec
S' Lateral: 3.05 cm
Weight: 2080 oz

## 2022-04-13 LAB — CBC
HCT: 34.5 % — ABNORMAL LOW (ref 36.0–46.0)
Hemoglobin: 10.9 g/dL — ABNORMAL LOW (ref 12.0–15.0)
MCH: 32.8 pg (ref 26.0–34.0)
MCHC: 31.6 g/dL (ref 30.0–36.0)
MCV: 103.9 fL — ABNORMAL HIGH (ref 80.0–100.0)
Platelets: 167 10*3/uL (ref 150–400)
RBC: 3.32 MIL/uL — ABNORMAL LOW (ref 3.87–5.11)
RDW: 15.1 % (ref 11.5–15.5)
WBC: 10.7 10*3/uL — ABNORMAL HIGH (ref 4.0–10.5)
nRBC: 0 % (ref 0.0–0.2)

## 2022-04-13 LAB — HEPARIN LEVEL (UNFRACTIONATED)
Heparin Unfractionated: 0.17 IU/mL — ABNORMAL LOW (ref 0.30–0.70)
Heparin Unfractionated: 0.18 IU/mL — ABNORMAL LOW (ref 0.30–0.70)

## 2022-04-13 LAB — TROPONIN I (HIGH SENSITIVITY): Troponin I (High Sensitivity): 5 ng/L (ref <18)

## 2022-04-13 LAB — MRSA NEXT GEN BY PCR, NASAL: MRSA by PCR Next Gen: NOT DETECTED

## 2022-04-13 MED ORDER — HEPARIN BOLUS VIA INFUSION
1800.0000 [IU] | Freq: Once | INTRAVENOUS | Status: AC
  Administered 2022-04-13: 1800 [IU] via INTRAVENOUS
  Filled 2022-04-13: qty 1800

## 2022-04-13 MED ORDER — POTASSIUM CHLORIDE CRYS ER 20 MEQ PO TBCR
40.0000 meq | EXTENDED_RELEASE_TABLET | Freq: Once | ORAL | Status: AC
Start: 2022-04-13 — End: 2022-04-13
  Administered 2022-04-13: 40 meq via ORAL
  Filled 2022-04-13: qty 2

## 2022-04-13 NOTE — ED Notes (Signed)
Pt changed and given sips of water. Refused food at this time. She was alert to situation stating she has clots in lungs.

## 2022-04-13 NOTE — ED Notes (Signed)
Patient has pur-wick

## 2022-04-13 NOTE — Progress Notes (Addendum)
PROGRESS NOTE    Natalie West  SNK:[id number] DOB: [birth year] DOA: 04/12/2022 PCP: Christain Sacramento, MD    Brief Narrative:  Natalie West is a 88 y.o. female with history of atrial fibrillation not on anticoagulation with risk of falls, pulmonary hypertension, hyperlipidemia who was recently admitted to the hospital in March for C-spine and right knee fracture was discharged to rehab and recently discharged from rehab was found to be hypoxic after physical therapy came to visit the patient.   Assessment and Plan: Acute pulm embolism  -no strain pattern  - Dopplers of the lower extremities positive for DVT -Check 2D echo.   -Presently on IV heparin: TOC consult for eliquis/xarelto  Acute respiratory failure with hypoxia requiring 2 L oxygen likely from pulmonary embolism and also some contribution from pulmonary hypertension.  - Follow 2D echo  History of A-fib  -on atenolol.   -Not on anticoagulation previously due to risk of falls.   -now on heparin for PE  Recent admission for C-spine fracture and right patella fracture  -managed conservatively.  History of hyperlipidemia  -on statins.  Prior history of breast cancer in remission.   Seems to have declined over last 3 months- -will get palliative care consult  DVT prophylaxis: heparin gtt    Code Status: Full Code Family Communication: called nephew  Disposition Plan:  Level of care: Telemetry Medical Status is: Observation The patient will require care spanning > 2 midnights and should be moved to inpatient because: IV heprain    Consultants:  Palliative care   Subjective: Wants to go home  Objective: Vitals:   04/13/22 0200 04/13/22 0400 04/13/22 0845 04/13/22 0915  BP: 116/76 129/73 (!) 115/96   Pulse: 74 90 95 76  Resp: '16 18 19 18  '$ Temp:      TempSrc:      SpO2: 95% 95% 96% 96%  Weight:      Height:        Intake/Output Summary (Last 24 hours) at 04/13/2022 1020 Last data filed at 04/12/2022  2130 Gross per 24 hour  Intake 350 ml  Output --  Net 350 ml   Filed Weights   04/12/22 1629  Weight: 59 kg    Examination:   General: Appearance:    Elderly/frail female in no acute distress     Lungs:     respirations unlabored  Heart:    Normal heart rate.   MS:   All extremities are intact.    Neurologic:   Awake, alert, oriented x 3       Data Reviewed: I have personally reviewed following labs and imaging studies  CBC: Recent Labs  Lab 04/12/22 1641 04/13/22 0606  WBC 12.6* 10.7*  NEUTROABS 10.0*  --   HGB 11.8* 10.9*  HCT 38.1 34.5*  MCV 102.7* 103.9*  PLT 197 937   Basic Metabolic Panel: Recent Labs  Lab 04/12/22 1641 04/13/22 0606  NA 139 139  K 3.7 3.4*  CL 108 110  CO2 24 21*  GLUCOSE 110* 104*  BUN 20 20  CREATININE 0.89 0.80  CALCIUM 8.3* 8.1*   GFR: Estimated Creatinine Clearance: 38.4 mL/min (by C-G formula based on SCr of 0.8 mg/dL). Liver Function Tests: Recent Labs  Lab 04/12/22 1641 04/13/22 0606  AST 18 18  ALT 16 10  ALKPHOS 83 69  BILITOT 0.7 1.1  PROT 6.9 5.8*  ALBUMIN 3.1* 2.7*   No results for input(s): "LIPASE", "AMYLASE" in the last 168 hours.  No results for input(s): "AMMONIA" in the last 168 hours. Coagulation Profile: Recent Labs  Lab 04/12/22 1641  INR 1.4*   Cardiac Enzymes: No results for input(s): "CKTOTAL", "CKMB", "CKMBINDEX", "TROPONINI" in the last 168 hours. BNP (last 3 results) No results for input(s): "PROBNP" in the last 8760 hours. HbA1C: No results for input(s): "HGBA1C" in the last 72 hours. CBG: No results for input(s): "GLUCAP" in the last 168 hours. Lipid Profile: No results for input(s): "CHOL", "HDL", "LDLCALC", "TRIG", "CHOLHDL", "LDLDIRECT" in the last 72 hours. Thyroid Function Tests: No results for input(s): "TSH", "T4TOTAL", "FREET4", "T3FREE", "THYROIDAB" in the last 72 hours. Anemia Panel: No results for input(s): "VITAMINB12", "FOLATE", "FERRITIN", "TIBC", "IRON",  "RETICCTPCT" in the last 72 hours. Sepsis Labs: Recent Labs  Lab 04/12/22 1641  PROCALCITON <0.10  LATICACIDVEN 1.9    Recent Results (from the past 240 hour(s))  Blood Culture (routine x 2)     Status: None (Preliminary result)   Collection Time: 04/12/22  4:41 PM   Specimen: BLOOD LEFT FOREARM  Result Value Ref Range Status   Specimen Description BLOOD LEFT FOREARM  Final   Special Requests   Final    BOTTLES DRAWN AEROBIC AND ANAEROBIC Blood Culture results may not be optimal due to an excessive volume of blood received in culture bottles   Culture   Final    NO GROWTH < 24 HOURS Performed at [facility], [place]. [address], [place], [place] [address]    Report Status PENDING  Incomplete  Blood Culture (routine x 2)     Status: None (Preliminary result)   Collection Time: 04/12/22  4:46 PM   Specimen: BLOOD LEFT FOREARM  Result Value Ref Range Status   Specimen Description BLOOD LEFT FOREARM  Final   Special Requests   Final    BOTTLES DRAWN AEROBIC AND ANAEROBIC Blood Culture results may not be optimal due to an excessive volume of blood received in culture bottles   Culture   Final    NO GROWTH < 24 HOURS Performed at [facility], [place] [address] [place], [place] [address]    Report Status PENDING  Incomplete  Resp Panel by RT-PCR (Flu A&B, Covid)     Status: None   Collection Time: 04/12/22  5:31 PM   Specimen: Nasal Swab  Result Value Ref Range Status   SARS Coronavirus 2 by RT PCR NEGATIVE NEGATIVE Final    Comment: (NOTE) SARS-CoV-2 target nucleic acids are NOT DETECTED.  The SARS-CoV-2 RNA is generally detectable in upper respiratory specimens during the acute phase of infection. The lowest concentration of SARS-CoV-2 viral copies this assay can detect is 138 copies/mL. A negative result does not preclude SARS-Cov-2 infection and should not be used as the sole basis for treatment or other patient management decisions. A negative result may occur with   improper specimen collection/handling, submission of specimen other than nasopharyngeal swab, presence of viral mutation(s) within the areas targeted by this assay, and inadequate number of viral copies(<138 copies/mL). A negative result must be combined with clinical observations, patient history, and epidemiological information. The expected result is Negative.  Fact Sheet for Patients:  EntrepreneurPulse.com.au  Fact Sheet for Healthcare Providers:  IncredibleEmployment.be  This test is no t yet approved or cleared by the [place] FDA and  has been authorized for detection and/or diagnosis of SARS-CoV-2 by FDA under an Emergency Use Authorization (EUA). This EUA will remain  in effect (meaning this test can be used)  for the duration of the COVID-19 declaration under Section 564(b)(1) of the Act, 21 U.S.C.section 360bbb-3(b)(1), unless the authorization is terminated  or revoked sooner.       Influenza A by PCR NEGATIVE NEGATIVE Final   Influenza B by PCR NEGATIVE NEGATIVE Final    Comment: (NOTE) The Xpert Xpress SARS-CoV-2/FLU/RSV plus assay is intended as an aid in the diagnosis of influenza from Nasopharyngeal swab specimens and should not be used as a sole basis for treatment. Nasal washings and aspirates are unacceptable for Xpert Xpress SARS-CoV-2/FLU/RSV testing.  Fact Sheet for Patients: EntrepreneurPulse.com.au  Fact Sheet for Healthcare Providers: IncredibleEmployment.be  This test is not yet approved or cleared by the [place] FDA and has been authorized for detection and/or diagnosis of SARS-CoV-2 by FDA under an Emergency Use Authorization (EUA). This EUA will remain in effect (meaning this test can be used) for the duration of the COVID-19 declaration under Section 564(b)(1) of the Act, 21 U.S.C. section 360bbb-3(b)(1), unless the authorization is terminated  or revoked.  Performed at [facility], [place] [address], [place], [place] [address]   MRSA Next Gen by PCR, Nasal     Status: None   Collection Time: 04/12/22 11:11 PM   Specimen: Nasal Swab  Result Value Ref Range Status   MRSA by PCR Next Gen NOT DETECTED NOT DETECTED Final    Comment: (NOTE) The GeneXpert MRSA Assay (FDA approved for NASAL specimens only), is one component of a comprehensive MRSA colonization surveillance program. It is not intended to diagnose MRSA infection nor to guide or monitor treatment for MRSA infections. Test performance is not FDA approved in patients less than 28 years old. Performed at [facility], [place] [address], [place], [place] [address]          Radiology Studies: CT Angio Chest Pulmonary Embolism (PE) W or WO Contrast  Result Date: 04/12/2022 CLINICAL DATA:  Pulmonary embolism (PE) suspected, high prob. Cough. Difficulty breathing. EXAM: CT ANGIOGRAPHY CHEST WITH CONTRAST TECHNIQUE: Multidetector CT imaging of the chest was performed using the standard protocol during bolus administration of intravenous contrast. Multiplanar CT image reconstructions and MIPs were obtained to evaluate the vascular anatomy. RADIATION DOSE REDUCTION: This exam was performed according to the departmental dose-optimization program which includes automated exposure control, adjustment of the mA and/or kV according to patient size and/or use of iterative reconstruction technique. CONTRAST:  30m OMNIPAQUE IOHEXOL 350 MG/ML SOLN COMPARISON:  01/21/2022 FINDINGS: Cardiovascular: Filling defects in the right lower lobe pulmonary arteries compatible with pulmonary emboli. No evidence of right heart strain. Cardiomegaly. Prominent central pulmonary arteries suggest pulmonary arterial hypertension. Coronary artery and aortic calcifications. No aneurysm. Mediastinum/Nodes: No mediastinal, hilar, or axillary adenopathy. Trachea and esophagus are unremarkable. Thyroid  unremarkable. Lungs/Pleura: Centrilobular emphysema. Ground-glass opacities within the upper lung zones. No confluent opacities or effusions. Upper Abdomen: No acute findings Musculoskeletal: Chest wall soft tissues are unremarkable. No acute bony abnormality. Review of the MIP images confirms the above findings. IMPRESSION: Positive for acute pulmonary emboli in the right lower lobe. Cardiomegaly, coronary artery disease. Prominent central pulmonary arteries suggest pulmonary arterial hypertension. Aortic Atherosclerosis (ICD10-I70.0) and Emphysema (ICD10-J43.9). Electronically Signed   By: KRolm BaptiseM.D.   On: 04/12/2022 22:20   DG Chest Port 1 View  Result Date: 04/12/2022 CLINICAL DATA:  Family states patient has completed x2 courses of abx for a uti but it returns when course is complete. Pt has been confused and having hallucinations for the past  10 days. EXAM: PORTABLE CHEST 1 VIEW COMPARISON:  CT, 01/21/2022.  Chest radiograph, 12/06/2021. FINDINGS: Cardiac silhouette borderline enlarged. No mediastinal or hilar masses. Low lung volumes. Coarse bilateral interstitial thickening. Intervening hazy airspace opacities no left mid and upper lung and right upper lung, new compared to the prior radiographs. No convincing pleural effusion.  No pneumothorax. Skeletal structures are grossly intact. IMPRESSION: 1. Hazy bilateral airspace lung opacities superimposed on chronic interstitial thickening. Findings suspicious for multifocal pneumonia. Electronically Signed   By: Lajean Manes M.D.   On: 04/12/2022 17:22        Scheduled Meds:  aspirin EC  81 mg Oral Daily   atenolol  12.5 mg Oral QHS   pravastatin  20 mg Oral QHS   Continuous Infusions:  heparin 950 Units/hr (04/13/22 0034)     LOS: 0 days    Time spent: 65 minutes spent on chart review, discussion with nursing staff, consultants, updating family and interview/physical exam; more than 50% of that time was spent in counseling and/or  coordination of care.    Geradine Girt, DO [facility] Available via Epic secure chat 7am-7pm After these hours, please refer to coverage provider listed on [email or web address] 04/13/2022, 10:20 AM

## 2022-04-13 NOTE — Progress Notes (Signed)
ANTICOAGULATION CONSULT NOTE  Pharmacy Consult for heparin Indication: pulmonary embolus  Allergies  Allergen Reactions   Ciprofloxacin Other (See Comments)    Reaction unknown   Clindamycin/Lincomycin Other (See Comments)    Reaction unknown    Codeine Nausea And Vomiting   Diclofenac Other (See Comments)    Reaction unknown    Fenoprofen Calcium Other (See Comments)    Reaction unknown    Flexeril [Cyclobenzaprine Hcl] Other (See Comments)    Reaction unknown    Hydrocodone Other (See Comments)    "Last time it was too strong and messed up my mind"   Macrobid [Nitrofurantoin] Other (See Comments)    Reaction unknown    Noroxin [Norfloxacin] Other (See Comments)    Reaction unknown    Relafen [Nabumetone] Other (See Comments)    Reaction unknown    Sulfa Antibiotics Other (See Comments)    Reaction unknown     Patient Measurements: Height: '5\' 2"'$  (157.5 cm) Weight: 59 kg (130 lb) IBW/kg (Calculated) : 50.1 Heparin Dosing Weight: 59 Kg  Vital Signs: Temp: 98.2 F (36.8 C) (06/12 1949) Temp Source: Oral (06/12 1949) BP: 122/89 (06/12 1949) Pulse Rate: 89 (06/12 1949)  Labs: Recent Labs    04/12/22 1641 04/12/22 2311 04/13/22 0606 04/13/22 0823 04/13/22 1927  HGB 11.8*  --  10.9*  --   --   HCT 38.1  --  34.5*  --   --   PLT 197  --  167  --   --   APTT 32  --   --   --   --   LABPROT 17.1*  --   --   --   --   INR 1.4*  --   --   --   --   HEPARINUNFRC  --   --   --  0.17* 0.18*  CREATININE 0.89  --  0.80  --   --   TROPONINIHS  --  5  --   --   --      Estimated Creatinine Clearance: 38.4 mL/min (by C-G formula based on SCr of 0.8 mg/dL).   Assessment:  89 yoF on IV heparin for PE. Heparin level remains sub-therapeutic at 0.18 units/mL on 1100 units/hr.  No issue with heparin infusion nor bleeding per discussion with RN.  Goal of Therapy:  Heparin level 0.3-0.7 units/ml Monitor platelets by anticoagulation protocol: Yes   Plan:  Repeat  heparin bolus 1800 units, then  Increase heparin infusion to 1350 units/hr F/U AM labs  Amarie Tarte D. Mina Marble, PharmD, BCPS, [place] 04/13/2022, 8:30 PM

## 2022-04-13 NOTE — Plan of Care (Signed)
  Problem: Education: Goal: Knowledge of General Education information will improve Description: Including pain rating scale, medication(s)/side effects and non-pharmacologic comfort measures Outcome: Progressing   Problem: Activity: Goal: Risk for activity intolerance will decrease Outcome: Progressing   Problem: Elimination: Goal: Will not experience complications related to bowel motility Outcome: Progressing Goal: Will not experience complications related to urinary retention Outcome: Progressing   Problem: Pain Managment: Goal: General experience of comfort will improve Outcome: Progressing   Problem: Safety: Goal: Ability to remain free from injury will improve Outcome: Progressing   

## 2022-04-13 NOTE — Consult Note (Signed)
Consultation Note Date: 04/13/2022   Patient Name: Natalie West  DOB: 1931/03/29  MRN: [id number]  Age / Sex: 88 y.o., female  PCP: Christain Sacramento, MD Referring Physician: Geradine Girt, DO  Reason for Consultation: Establishing goals of care  HPI/Patient Profile: 88 y.o. female  with past medical history of atrial fibrillation not on anticoagulation with risk of falls, pulmonary hypertension, hyperlipidemia, breast cancer, admitted on 04/12/2022 with hypoxia and dyspnea noted during PT visit at home.    Patient was recently admitted in March after a fall with a C-spine and right knee fracture, was discharged to rehab and returned home recently.  Has had ongoing decline since then.  PMT has been consulted to assist with goals of care conversation.  Clinical Assessment and Goals of Care:  I have reviewed medical records including EPIC notes, labs and imaging, received report from RN, assessed the patient and then met at the bedside with caregiver Anaya, and called Joya Salm to discuss diagnosis prognosis, GOC, EOL wishes, disposition and options.  I introduced Palliative Medicine as specialized medical care for people living with serious illness. It focuses on providing relief from the symptoms and stress of a serious illness. The goal is to improve quality of life for both the patient and the family.  We discussed a brief life review of the patient and then focused on their current illness.   I attempted to elicit values and goals of care important to the patient.    Medical History Review and Understanding:  Patient was too tired to discuss.  Belenda Cruise and I talked about patient's acute PE, risk of fall while on anticoagulation, overall decline.  Social History: Patient lives at home with a caregiver 12 hours a day.  Her son lives in a home just behind hers and is within walking distance, also requires a  daily caregiver.  Belenda Cruise notes that she has had anxiety her whole life, but confusion has been worsening with recurrent UTIs lately.  He is wondering if she will now need 24 hours of assistance so he can begin planning for this.  Financially, her money will not be able to sustain this level of care at home for too long.  She also makes more than what Medicaid would accept.  They are working with an Estate agent to assist with this planning.  Functional and Nutritional State: Patient's caregiver tells me that she has not been eating very much at all since returning home from rehab on Thursday.  Anaya confirms that she has been bedbound and wheelchair-bound since returning home.  Palliative Symptoms: Anxiety  Advance Directives: A detailed discussion regarding advanced directives was had.  [place]apoleon Form is her HCPOA.  She also has a living will on file.   Code Status: Concepts specific to code status, artifical feeding and hydration, and rehospitalization were considered and discussed.   Discussion: Nicoletta tells me she is too tired to talk, says she is supposed to be going to the hospital.  Reoriented and reassured patient that she is in currently the hospital waiting for a bed upstairs.  She says "lets go" repeatedly and seems to be confused/anxious about bed transfer.  Attempted to elicit her goals of care, she states she wants to "get better" and to her this would be improvement of her cough.  She has never thought of other preferences on treatment options or what she would like the medical team to do if she were to get worse not better.  I  then called her Joya Salm, who tells me that her priority is to be home near her son.  Patient was supposed to have first visit with PT at home today after a first visit from [place] yesterday.  He hopes that this would be helpful in her "frame of mind" to participate more, as she was refusing PT at rehab.  He had to appeal her discharge 12 times and  they ultimately stopped attempting to work with her.  Her anxiety has been exacerbated since her fall in March and she did not want to fall again while working with PT.  Belenda Cruise has previous experience with hospice in the family and is open to ongoing conversations with palliative care to clarify patient's wishes and plan for the best case and worst-case scenarios.  He lives 2 hours away and had to return to work after spending a few long days with Remo Lipps.  He is open to meeting if she is still admitted later in this week when he can return to [place].  He understands it is important to have this conversation now, as patient is starting on anticoagulation and still has high risk of falling, further decline, and severe complications.   The difference between aggressive medical intervention and comfort care was considered in light of the patient's goals of care. Hospice and Palliative Care services outpatient were explained and offered.   Discussed the importance of continued conversation with family and the medical providers regarding overall plan of care and treatment options, ensuring decisions are within the context of the patient's values and GOCs.   Questions and concerns were addressed. The family was encouraged to call with questions or concerns.  PMT will continue to support holistically.  SUMMARY OF RECOMMENDATIONS   -CODE STATUS changed to DNR -Continue full scope treatment otherwise -We will continue attempts to have ongoing goals of care conversations -Patient's priority is to be at home near her son -Patient's HCPOA would like to determine whether caregiver needs have increased so he can arrange more coverage at her home -PMT will continue to follow  Prognosis:  Poor prognosis given advanced age, failure to thrive, acute PE requiring anticoagulation with high risk of more falls, nutritional/functional decline  Discharge Planning: Home with Home Health      Primary  Diagnoses: Present on Admission:  Acute pulmonary embolism ([place])  Breast cancer of lower-outer quadrant of right female breast ([place])  Acute diastolic heart failure ([place])  Hyperlipidemia  Pulmonary emboli ([place])   Physical Exam Vitals and nursing note reviewed.  Constitutional:      General: She is not in acute distress.    Appearance: She is ill-appearing.  Cardiovascular:     Rate and Rhythm: Normal rate.  Pulmonary:     Effort: Pulmonary effort is normal.  Skin:    General: Skin is warm and dry.  Neurological:     Mental Status: She is alert. She is confused.     Comments: Oriented to person and time  Psychiatric:        Mood and Affect: Mood is anxious.     Vital Signs: BP (!) 110/92   Pulse 78   Temp 97.8 F (36.6 C) (Oral)   Resp 17   Ht 5' 2"  (1.575 m)   Wt 59 kg   LMP 11/03/1983   SpO2 95%   BMI 23.78 kg/m          SpO2: SpO2: 95 % O2 Device:SpO2: 95 % O2 Flow Rate: .  Palliative Assessment/Data:    MDM: High  Rahi Chandonnet Johnnette Litter, PA-C  Palliative Medicine Team Team phone # [phone]  Thank you for allowing the Palliative Medicine Team to assist in the care of this patient. Please utilize secure chat with additional questions, if there is no response within 30 minutes please call the above phone number.  Palliative Medicine Team providers are available by phone from 7am to 7pm daily and can be reached through the team cell phone.  Should this patient require assistance outside of these hours, please call the patient's attending physician.

## 2022-04-13 NOTE — Progress Notes (Signed)
[facility] for heparin Indication: pulmonary embolus  Allergies  Allergen Reactions   Ciprofloxacin Other (See Comments)    Reaction unknown   Clindamycin/Lincomycin Other (See Comments)    Reaction unknown    Codeine Nausea And Vomiting   Diclofenac Other (See Comments)    Reaction unknown    Fenoprofen Calcium Other (See Comments)    Reaction unknown    Flexeril [Cyclobenzaprine Hcl] Other (See Comments)    Reaction unknown    Hydrocodone Other (See Comments)    "Last time it was too strong and messed up my mind"   Macrobid [Nitrofurantoin] Other (See Comments)    Reaction unknown    Noroxin [Norfloxacin] Other (See Comments)    Reaction unknown    Relafen [Nabumetone] Other (See Comments)    Reaction unknown    Sulfa Antibiotics Other (See Comments)    Reaction unknown     Patient Measurements: Height: '5\' 2"'$  (157.5 cm) Weight: 59 kg (130 lb) IBW/kg (Calculated) : 50.1 Heparin Dosing Weight: 59 Kg  Vital Signs: BP: 115/96 (06/12 0845) Pulse Rate: 76 (06/12 0915)  Labs: Recent Labs    04/12/22 1641 04/12/22 2311 04/13/22 0606 04/13/22 0823  HGB 11.8*  --  10.9*  --   HCT 38.1  --  34.5*  --   PLT 197  --  167  --   APTT 32  --   --   --   LABPROT 17.1*  --   --   --   INR 1.4*  --   --   --   HEPARINUNFRC  --   --   --  0.17*  CREATININE 0.89  --  0.80  --   TROPONINIHS  --  5  --   --      Estimated Creatinine Clearance: 38.4 mL/min (by C-G formula based on SCr of 0.8 mg/dL).   Medical History: Past Medical History:  Diagnosis Date   Anxiety    Arthritis    Breast cancer ([place]) 2006   right breast   Chronic cough    Chronic UTI    Depression    Dysrhythmia    Esophageal reflux    Esophageal stricture    Family history of malignant neoplasm of gastrointestinal tract    GERD (gastroesophageal reflux disease)    Hearing loss    Hemorrhoids    internal   History of hiatal hernia     Hypercholesterolemia    Hypertension    Numbness and tingling    feeet, occasionally   Osteopenia    Personal history of colonic polyps 02/09/2007   TUBULAR ADENOMA   Personal history of radiation therapy    PONV (postoperative nausea and vomiting)    trouble opening mouth and jaw, jaw pops, trouble turning head   Rotator cuff tear    RT   Status post dilation of esophageal narrowing    Umbilical hernia     Assessment: 89 yoF on heparin for PE.   Heparin level 0.17 (subtherapeutic) on infusion on 950 units/hr. Hgb down a bit to 10.9. No issues with line or bleeding reported per RN.  Goal of Therapy:  Heparin level 0.3-0.7 units/ml Monitor platelets by anticoagulation protocol: Yes   Plan:  Give heparin bolus 1800 units Increase heparin infusion to 1100 units/hr Check anti-Xa level in 8 hours   Sherlon Handing, PharmD, BCPS Please see amion for complete clinical pharmacist phone list 04/13/2022 10:11 AM

## 2022-04-13 NOTE — Progress Notes (Signed)
Lower extremity venous bilateral study completed.  Preliminary results relayed to Lindy, DO.  See CV Proc for preliminary results report.   Darlin Coco, RDMS, RVT

## 2022-04-13 NOTE — ED Notes (Addendum)
Patient has refused to eat all day. Did take few bites of applesauce with her crushed meds. She states she does not feel hungry. Aide at bedside reports this is not really new for her.

## 2022-04-13 NOTE — ED Notes (Signed)
Echo at bedside

## 2022-04-13 NOTE — ED Notes (Signed)
Palliative at bedside to do consult.

## 2022-04-13 NOTE — Progress Notes (Signed)
Echocardiogram 2D Echocardiogram has been performed.  Natalie West 04/13/2022, 11:02 AM

## 2022-04-14 ENCOUNTER — Other Ambulatory Visit (HOSPITAL_COMMUNITY): Payer: Self-pay

## 2022-04-14 DIAGNOSIS — I2699 Other pulmonary embolism without acute cor pulmonale: Secondary | ICD-10-CM | POA: Diagnosis not present

## 2022-04-14 DIAGNOSIS — L8990 Pressure ulcer of unspecified site, unspecified stage: Secondary | ICD-10-CM | POA: Insufficient documentation

## 2022-04-14 DIAGNOSIS — N390 Urinary tract infection, site not specified: Secondary | ICD-10-CM | POA: Diagnosis not present

## 2022-04-14 DIAGNOSIS — I5031 Acute diastolic (congestive) heart failure: Secondary | ICD-10-CM | POA: Diagnosis not present

## 2022-04-14 LAB — BASIC METABOLIC PANEL
Anion gap: 8 (ref 5–15)
BUN: 20 mg/dL (ref 8–23)
CO2: 22 mmol/L (ref 22–32)
Calcium: 8.6 mg/dL — ABNORMAL LOW (ref 8.9–10.3)
Chloride: 110 mmol/L (ref 98–111)
Creatinine, Ser: 0.89 mg/dL (ref 0.44–1.00)
GFR, Estimated: >60 mL/min (ref >60)
Glucose, Bld: 88 mg/dL (ref 70–99)
Potassium: 4 mmol/L (ref 3.5–5.1)
Sodium: 140 mmol/L (ref 135–145)

## 2022-04-14 LAB — CBC
HCT: 33.9 % — ABNORMAL LOW (ref 36.0–46.0)
Hemoglobin: 10.5 g/dL — ABNORMAL LOW (ref 12.0–15.0)
MCH: 32.1 pg (ref 26.0–34.0)
MCHC: 31 g/dL (ref 30.0–36.0)
MCV: 103.7 fL — ABNORMAL HIGH (ref 80.0–100.0)
Platelets: 169 10*3/uL (ref 150–400)
RBC: 3.27 MIL/uL — ABNORMAL LOW (ref 3.87–5.11)
RDW: 15.2 % (ref 11.5–15.5)
WBC: 10.3 10*3/uL (ref 4.0–10.5)
nRBC: 0 % (ref 0.0–0.2)

## 2022-04-14 LAB — HEPARIN LEVEL (UNFRACTIONATED)
Heparin Unfractionated: 0.38 IU/mL (ref 0.30–0.70)
Heparin Unfractionated: 0.35 IU/mL (ref 0.30–0.70)

## 2022-04-14 MED ORDER — SENNOSIDES-DOCUSATE SODIUM 8.6-50 MG PO TABS
1.0000 | ORAL_TABLET | Freq: Two times a day (BID) | ORAL | Status: DC
  Administered 2022-04-14 – 2022-04-16 (×5): 1 via ORAL
  Filled 2022-04-14 (×5): qty 1

## 2022-04-14 MED ORDER — POLYETHYLENE GLYCOL 3350 17 G PO PACK
17.0000 g | PACK | Freq: Every day | ORAL | Status: DC
  Administered 2022-04-14 – 2022-04-16 (×2): 17 g via ORAL
  Filled 2022-04-14 (×2): qty 1

## 2022-04-14 NOTE — Progress Notes (Signed)
ANTICOAGULATION CONSULT NOTE - Follow Up Consult  Pharmacy Consult for heparin Indication: pulmonary embolus  Labs: Recent Labs    04/12/22 1641 04/12/22 2311 04/13/22 0606 04/13/22 0823 04/13/22 1927 04/14/22 0053  HGB 11.8*  --  10.9*  --   --  10.5*  HCT 38.1  --  34.5*  --   --  33.9*  PLT 197  --  167  --   --  169  APTT 32  --   --   --   --   --   LABPROT 17.1*  --   --   --   --   --   INR 1.4*  --   --   --   --   --   HEPARINUNFRC  --   --   --  0.17* 0.18* 0.38  CREATININE 0.89  --  0.80  --   --   --   TROPONINIHS  --  5  --   --   --   --     Assessment: 88yo female therapeutic on heparin after rate change but at lower end of goal; no infusion issues or signs of bleeding per RN.  Goal of Therapy:  Heparin level 0.3-0.7 units/ml   Plan:  Will increase heparin infusion slightly to 1400 units/hr and check level in 8 hours.    Wynona Neat, PharmD, BCPS  04/14/2022,2:55 AM

## 2022-04-14 NOTE — Progress Notes (Addendum)
PROGRESS NOTE    Natalie West  VQQ:[id number] DOB: [birth year] DOA: 04/12/2022 PCP: Christain Sacramento, MD   Brief Narrative: 88 year old with past medical history significant for A-fib not on anticoagulation due to risk for fall, pulmonary hypertension, hyperlipidemia who was recently admitted to the hospital in March for C-spine and right knee fracture was discharged to rehab and recently discharged from rehab to  home, she was found to be hypoxic after physical therapy came to visit patient.  Patient was found to have acute PE and bilateral lower extremity DVT.  she was started on heparin gtt, plan to continue with heparin gtt for another 24 hours.    Assessment & Plan:   Principal Problem:   Acute pulmonary embolism (HCC) Active Problems:   Hyperlipidemia   Breast cancer of lower-outer quadrant of right female breast ([place])   Acute diastolic heart failure (HCC)   Pulmonary emboli (HCC)   Pressure injury of skin   1-Acute pulmonary emboli: Right lower lobe -CT angio positive for acute PE right lower lobe -Bilateral lower extremity doppler positive for DVT -Continue with heparin gtt for another 24 hour. Transition to NOAC tomorrow.  -ECHO; RV function normal.   2-BL DVT: acute DVT involving the right common femoral vein and SF junction, acute DVT left common femoral vein, SNF, left femoral vein, left proximal profunda vein and left popliteal vein: -Continue with heparin gtt for another 24.  Transition to NOAC possible on 6/14.   3-History of A-fib: Continue with Atenolol.  She was not on anticoagulation due to risk for fall.   Recent admission for C-spine fracture and right patella fracture: Managed conservatively.   Constipation;  Started senna-docusate. Miralax.   History of hyperlipidemia: Continue with statin  History of prior breast cancer: In remission Hypokalemia; replaced.   Failure to thrive: She has been declining over the last 3 months.  Palliative care  consulted. Per palliative notes, continue with full scope treatments, CODE STATUS changed to DNR.  Palliative care will continue to follow-up. Will need to determine if patient needs 24 hours care.     Pressure Injury 04/13/22 Sacrum Medial Stage 2 -  Partial thickness loss of dermis presenting as a shallow open injury with a red, pink wound bed without slough. (Active)  04/13/22 1930  Location: Sacrum  Location Orientation: Medial  Staging: Stage 2 -  Partial thickness loss of dermis presenting as a shallow open injury with a red, pink wound bed without slough.  Wound Description (Comments):   Present on Admission: Yes  Dressing Type Foam - Lift dressing to assess site every shift 04/14/22 0740     Pressure Injury 04/13/22 Vertebral column Medial Stage 2 -  Partial thickness loss of dermis presenting as a shallow open injury with a red, pink wound bed without slough. (Active)  04/13/22 1930  Location: Vertebral column  Location Orientation: Medial  Staging: Stage 2 -  Partial thickness loss of dermis presenting as a shallow open injury with a red, pink wound bed without slough.  Wound Description (Comments):   Present on Admission: Yes  Dressing Type Foam - Lift dressing to assess site every shift 04/14/22 0740      Estimated body mass index is 22.78 kg/m as calculated from the following:   Height as of this encounter: '5\' 2"'$  (1.575 m).   Weight as of this encounter: 56.5 kg.   DVT prophylaxis: heparin Gtt Code Status: DNR Family Communication: caregiver at bedside. POA spoke with palliative 6/12. Disposition  Plan:  Status is: Inpatient Remains inpatient appropriate because: remain in heparin Gtt for DVT/PE    Consultants:  Palliative  Procedures:  ECHO  Antimicrobials:    Subjective: She is having left foot pain. She is also having trouble with constipation.    Objective: Vitals:   04/13/22 1800 04/13/22 1949 04/13/22 2329 04/14/22 0416  BP: 121/80 122/89  126/60 119/71  Pulse:  89 81 72  Resp: '17 19 17 15  '$ Temp:  98.2 F (36.8 C) 98.1 F (36.7 C) 98.1 F (36.7 C)  TempSrc:  Oral Oral Axillary  SpO2:  97% 100% 96%  Weight:    56.5 kg  Height:        Intake/Output Summary (Last 24 hours) at 04/14/2022 1046 Last data filed at 04/13/2022 1920 Gross per 24 hour  Intake --  Output 100 ml  Net -100 ml   Filed Weights   04/12/22 1629 04/14/22 0416  Weight: 59 kg 56.5 kg    Examination:  General exam: Appears calm and comfortable  Respiratory system: Clear to auscultation. Respiratory effort normal. Cardiovascular system: S1 & S2 heard, RRR.  Gastrointestinal system: Abdomen is nondistended, soft and nontender. No organomegaly or masses felt. Normal bowel sounds heard. Central nervous system: Alert and oriented.  Extremities: Symmetric 5 x 5 power.   Data Reviewed: I have personally reviewed following labs and imaging studies  CBC: Recent Labs  Lab 04/12/22 1641 04/13/22 0606 04/14/22 0053  WBC 12.6* 10.7* 10.3  NEUTROABS 10.0*  --   --   HGB 11.8* 10.9* 10.5*  HCT 38.1 34.5* 33.9*  MCV 102.7* 103.9* 103.7*  PLT 197 167 948   Basic Metabolic Panel: Recent Labs  Lab 04/12/22 1641 04/13/22 0606  NA 139 139  K 3.7 3.4*  CL 108 110  CO2 24 21*  GLUCOSE 110* 104*  BUN 20 20  CREATININE 0.89 0.80  CALCIUM 8.3* 8.1*   GFR: Estimated Creatinine Clearance: 38.4 mL/min (by C-G formula based on SCr of 0.8 mg/dL). Liver Function Tests: Recent Labs  Lab 04/12/22 1641 04/13/22 0606  AST 18 18  ALT 16 10  ALKPHOS 83 69  BILITOT 0.7 1.1  PROT 6.9 5.8*  ALBUMIN 3.1* 2.7*   No results for input(s): "LIPASE", "AMYLASE" in the last 168 hours. No results for input(s): "AMMONIA" in the last 168 hours. Coagulation Profile: Recent Labs  Lab 04/12/22 1641  INR 1.4*   Cardiac Enzymes: No results for input(s): "CKTOTAL", "CKMB", "CKMBINDEX", "TROPONINI" in the last 168 hours. BNP (last 3 results) No results for  input(s): "PROBNP" in the last 8760 hours. HbA1C: No results for input(s): "HGBA1C" in the last 72 hours. CBG: No results for input(s): "GLUCAP" in the last 168 hours. Lipid Profile: No results for input(s): "CHOL", "HDL", "LDLCALC", "TRIG", "CHOLHDL", "LDLDIRECT" in the last 72 hours. Thyroid Function Tests: No results for input(s): "TSH", "T4TOTAL", "FREET4", "T3FREE", "THYROIDAB" in the last 72 hours. Anemia Panel: No results for input(s): "VITAMINB12", "FOLATE", "FERRITIN", "TIBC", "IRON", "RETICCTPCT" in the last 72 hours. Sepsis Labs: Recent Labs  Lab 04/12/22 1641  PROCALCITON <0.10  LATICACIDVEN 1.9    Recent Results (from the past 240 hour(s))  Blood Culture (routine x 2)     Status: None (Preliminary result)   Collection Time: 04/12/22  4:41 PM   Specimen: BLOOD LEFT FOREARM  Result Value Ref Range Status   Specimen Description BLOOD LEFT FOREARM  Final   Special Requests   Final    BOTTLES DRAWN AEROBIC AND  ANAEROBIC Blood Culture results may not be optimal due to an excessive volume of blood received in culture bottles   Culture   Final    NO GROWTH < 24 HOURS Performed at [facility] [address] [place], [place] [address]    Report Status PENDING  Incomplete  Urine Culture     Status: Abnormal (Preliminary result)   Collection Time: 04/12/22  4:41 PM   Specimen: Urine, Clean Catch  Result Value Ref Range Status   Specimen Description URINE, CLEAN CATCH  Final   Special Requests   Final    NONE Performed at [facility], [place] [address], [place], [place] [address]    Culture >=100,000 COLONIES/mL GRAM NEGATIVE RODS (A)  Final   Report Status PENDING  Incomplete  Blood Culture (routine x 2)     Status: None (Preliminary result)   Collection Time: 04/12/22  4:46 PM   Specimen: BLOOD LEFT FOREARM  Result Value Ref Range Status   Specimen Description BLOOD LEFT FOREARM  Final   Special Requests   Final    BOTTLES DRAWN AEROBIC AND ANAEROBIC  Blood Culture results may not be optimal due to an excessive volume of blood received in culture bottles   Culture   Final    NO GROWTH < 24 HOURS Performed at [facility], [place] [address] [place], [place] [address]    Report Status PENDING  Incomplete  Resp Panel by RT-PCR (Flu A&B, Covid)     Status: None   Collection Time: 04/12/22  5:31 PM   Specimen: Nasal Swab  Result Value Ref Range Status   SARS Coronavirus 2 by RT PCR NEGATIVE NEGATIVE Final    Comment: (NOTE) SARS-CoV-2 target nucleic acids are NOT DETECTED.  The SARS-CoV-2 RNA is generally detectable in upper respiratory specimens during the acute phase of infection. The lowest concentration of SARS-CoV-2 viral copies this assay can detect is 138 copies/mL. A negative result does not preclude SARS-Cov-2 infection and should not be used as the sole basis for treatment or other patient management decisions. A negative result may occur with  improper specimen collection/handling, submission of specimen other than nasopharyngeal swab, presence of viral mutation(s) within the areas targeted by this assay, and inadequate number of viral copies(<138 copies/mL). A negative result must be combined with clinical observations, patient history, and epidemiological information. The expected result is Negative.  Fact Sheet for Patients:  EntrepreneurPulse.com.au  Fact Sheet for Healthcare Providers:  IncredibleEmployment.be  This test is no t yet approved or cleared by the [place] FDA and  has been authorized for detection and/or diagnosis of SARS-CoV-2 by FDA under an Emergency Use Authorization (EUA). This EUA will remain  in effect (meaning this test can be used) for the duration of the COVID-19 declaration under Section 564(b)(1) of the Act, 21 U.S.C.section 360bbb-3(b)(1), unless the authorization is terminated  or revoked sooner.       Influenza A by PCR NEGATIVE NEGATIVE  Final   Influenza B by PCR NEGATIVE NEGATIVE Final    Comment: (NOTE) The Xpert Xpress SARS-CoV-2/FLU/RSV plus assay is intended as an aid in the diagnosis of influenza from Nasopharyngeal swab specimens and should not be used as a sole basis for treatment. Nasal washings and aspirates are unacceptable for Xpert Xpress SARS-CoV-2/FLU/RSV testing.  Fact Sheet for Patients: EntrepreneurPulse.com.au  Fact Sheet for Healthcare Providers: IncredibleEmployment.be  This test is not yet approved or cleared by the [place] FDA and has  been authorized for detection and/or diagnosis of SARS-CoV-2 by FDA under an Emergency Use Authorization (EUA). This EUA will remain in effect (meaning this test can be used) for the duration of the COVID-19 declaration under Section 564(b)(1) of the Act, 21 U.S.C. section 360bbb-3(b)(1), unless the authorization is terminated or revoked.  Performed at [facility], [place] [address], [place], [place] [address]   MRSA Next Gen by PCR, Nasal     Status: None   Collection Time: 04/12/22 11:11 PM   Specimen: Nasal Swab  Result Value Ref Range Status   MRSA by PCR Next Gen NOT DETECTED NOT DETECTED Final    Comment: (NOTE) The GeneXpert MRSA Assay (FDA approved for NASAL specimens only), is one component of a comprehensive MRSA colonization surveillance program. It is not intended to diagnose MRSA infection nor to guide or monitor treatment for MRSA infections. Test performance is not FDA approved in patients less than 56 years old. Performed at [facility], [place] [address] [place],  [address]          Radiology Studies: VAS [place] LOWER EXTREMITY VENOUS (DVT)  Result Date: 04/13/2022  Lower Venous DVT Study Patient Name:  ZIAIRE BIESER  Date of Exam:   04/13/2022 Medical Rec #: [id number]   Accession #:    [id number] Date of Birth: Mar 25, 1931   Patient Gender: F Patient Age:   51 years Exam Location:   [facility] Procedure:      VAS [place] LOWER EXTREMITY VENOUS (DVT) Referring Phys: Gean Birchwood --------------------------------------------------------------------------------  Indications: Pulmonary embolism.  Anticoagulation: Heparin. Limitations: Compressions not performed distal to acute findings. Comparison Study: 01-01-2022 Prior right lower extremity venous was positive for                   varicose SVT involving the right thigh veins. Performing Technologist: Darlin Coco RDMS, RVT  Examination Guidelines: A complete evaluation includes B-mode imaging, spectral Doppler, color Doppler, and power Doppler as needed of all accessible portions of each vessel. Bilateral testing is considered an integral part of a complete examination. Limited examinations for reoccurring indications may be performed as noted. The reflux portion of the exam is performed with the patient in reverse Trendelenburg.  +---------+---------------+---------+-----------+----------+--------------+ RIGHT    CompressibilityPhasicitySpontaneityPropertiesThrombus Aging +---------+---------------+---------+-----------+----------+--------------+ CFV      Partial        Yes      Yes                  Acute          +---------+---------------+---------+-----------+----------+--------------+ SFJ      Partial        Yes      Yes                  Acute          +---------+---------------+---------+-----------+----------+--------------+ FV Prox                 Yes      Yes                                 +---------+---------------+---------+-----------+----------+--------------+ FV Mid                  Yes      Yes                                 +---------+---------------+---------+-----------+----------+--------------+  FV Distal               Yes      Yes                                 +---------+---------------+---------+-----------+----------+--------------+ PFV                     Yes       Yes                                 +---------+---------------+---------+-----------+----------+--------------+ POP                     Yes      Yes                                 +---------+---------------+---------+-----------+----------+--------------+ PTV                     Yes      Yes                                 +---------+---------------+---------+-----------+----------+--------------+ PERO                    Yes      Yes                                 +---------+---------------+---------+-----------+----------+--------------+   +----------+---------------+---------+-----------+----------+--------------+ LEFT      CompressibilityPhasicitySpontaneityPropertiesThrombus Aging +----------+---------------+---------+-----------+----------+--------------+ CFV       Partial        Yes      Yes                  Acute          +----------+---------------+---------+-----------+----------+--------------+ SFJ       Partial        Yes      Yes                  Acute          +----------+---------------+---------+-----------+----------+--------------+ FV Prox   Partial        Yes      Yes                  Acute          +----------+---------------+---------+-----------+----------+--------------+ FV Mid                   No       No                   Acute          +----------+---------------+---------+-----------+----------+--------------+ FV Distal                No       No                   Acute          +----------+---------------+---------+-----------+----------+--------------+ PFV       None           No       No  Acute          +----------+---------------+---------+-----------+----------+--------------+ POP                      No       No                   Acute          +----------+---------------+---------+-----------+----------+--------------+ PTV                      Yes      Yes                                  +----------+---------------+---------+-----------+----------+--------------+ PERO                     Yes      Yes                                 +----------+---------------+---------+-----------+----------+--------------+ EIV Distal               Yes      Yes                                 +----------+---------------+---------+-----------+----------+--------------+     Summary: RIGHT: - Findings consistent with acute deep vein thrombosis involving the right common femoral vein, and SF junction. - No cystic structure found in the popliteal fossa. - Common femoral vein obstruction doesn't appear to extend above inguinal ligament.  LEFT: - Findings consistent with acute deep vein thrombosis involving the left common femoral vein, SF junction, left femoral vein, left proximal profunda vein, and left popliteal vein. - A cystic structure is found in the popliteal fossa. - The common femoral vein obstruction does not appear to extend above inguinal ligament.  *See table(s) above for measurements and observations. Electronically signed by Servando Snare MD on 04/13/2022 at 6:03:48 PM.    Final    ECHOCARDIOGRAM COMPLETE  Result Date: 04/13/2022    ECHOCARDIOGRAM REPORT   Patient Name:   MYCHAEL SOOTS Date of Exam: 04/13/2022 Medical Rec #:  [id number]  Height:       62.0 in Accession #:    [id number] Weight:       130.0 lb Date of Birth:  08-15-1931  BSA:          1.592 m Patient Age:    41 years   BP:           129/73 mmHg Patient Gender: F          HR:           90 bpm. Exam Location:  Inpatient Procedure: 2D Echo, Color Doppler and Cardiac Doppler Indications:    Pulmonary embolism  History:        Patient has prior history of Echocardiogram examinations, most                 recent 06/02/2021. Pulmonary HTN, Arrythmias:Atrial Fibrillation;                 Signs/Symptoms:Shortness of Breath.  Sonographer:    Joette Catching RCS Referring Phys: [place]  Sonographer Comments:  Technically challenging study due to limited acoustic windows, suboptimal apical window and suboptimal subcostal  window. Image acquisition challenging due to uncooperative patient. IMPRESSIONS  1. Left ventricular ejection fraction, by estimation, is 55 to 60%. The left ventricle has normal function. The left ventricle has no regional wall motion abnormalities. There is mild asymmetric left ventricular hypertrophy. Left ventricular diastolic parameters are indeterminate.  2. Right ventricular systolic function is normal. The right ventricular size is mildly enlarged. There is normal pulmonary artery systolic pressure.  3. Left atrial size was mildly dilated.  4. The mitral valve is grossly normal. Mild mitral valve regurgitation.  5. The aortic valve is tricuspid. Aortic valve regurgitation is mild to moderate.  6. Aortic no significnt ascending aortic aneurysm.  7. The inferior vena cava is normal in size with greater than 50% respiratory variability, suggesting right atrial pressure of 3 mmHg. Comparison(s): No significant change from prior study. FINDINGS  Left Ventricle: Left ventricular ejection fraction, by estimation, is 55 to 60%. The left ventricle has normal function. The left ventricle has no regional wall motion abnormalities. The left ventricular internal cavity size was normal in size. There is  mild asymmetric left ventricular hypertrophy. Left ventricular diastolic parameters are indeterminate. Right Ventricle: The right ventricular size is mildly enlarged. Right ventricular systolic function is normal. There is normal pulmonary artery systolic pressure. The tricuspid regurgitant velocity is 2.40 m/s, and with an assumed right atrial pressure of 3 mmHg, the estimated right ventricular systolic pressure is 36.1 mmHg. Left Atrium: Left atrial size was mildly dilated. Right Atrium: Right atrial size was normal in size. Pericardium: There is no evidence of pericardial effusion. Mitral Valve: The mitral  valve is grossly normal. Mild mitral valve regurgitation. Tricuspid Valve: The tricuspid valve is normal in structure. Tricuspid valve regurgitation is mild. Aortic Valve: The aortic valve is tricuspid. Aortic valve regurgitation is mild to moderate. Aortic regurgitation PHT measures 528 msec. Aortic valve mean gradient measures 3.0 mmHg. Aortic valve peak gradient measures 4.8 mmHg. Aortic valve area, by VTI  measures 1.98 cm. Pulmonic Valve: The pulmonic valve was not well visualized. Pulmonic valve regurgitation is trivial. Aorta: No significnt ascending aortic aneurysm. Venous: The inferior vena cava is normal in size with greater than 50% respiratory variability, suggesting right atrial pressure of 3 mmHg.  LEFT VENTRICLE PLAX 2D LVIDd:         4.20 cm   Diastology LVIDs:         3.05 cm   LV e' medial:    7.29 cm/s LV PW:         0.95 cm   LV E/e' medial:  10.5 LV IVS:        0.80 cm   LV e' lateral:   11.30 cm/s LVOT diam:     2.10 cm   LV E/e' lateral: 6.8 LV SV:         45 LV SV Index:   28 LVOT Area:     3.46 cm  RIGHT VENTRICLE          IVC RV Basal diam:  4.50 cm  IVC diam: 1.70 cm RV Mid diam:    3.50 cm LEFT ATRIUM             Index        RIGHT ATRIUM           Index LA diam:        4.10 cm 2.58 cm/m   RA Area:     16.00 cm LA Vol (A2C):   53.8 ml 33.80 ml/m  RA  Volume:   43.10 ml  27.08 ml/m LA Vol (A4C):   37.7 ml 23.68 ml/m LA Biplane Vol: 45.6 ml 28.65 ml/m  AORTIC VALVE                    PULMONIC VALVE AV Area (Vmax):    2.24 cm     PV Vmax:       0.81 m/s AV Area (Vmean):   2.09 cm     PV Peak grad:  2.6 mmHg AV Area (VTI):     1.98 cm AV Vmax:           110.00 cm/s AV Vmean:          86.800 cm/s AV VTI:            0.227 m AV Peak Grad:      4.8 mmHg AV Mean Grad:      3.0 mmHg LVOT Vmax:         71.10 cm/s LVOT Vmean:        52.400 cm/s LVOT VTI:          0.130 m LVOT/AV VTI ratio: 0.57 AI PHT:            528 msec AR Vena Contracta: 0.40 cm  AORTA Ao Root diam: 3.90 cm Ao Asc diam:   3.70 cm MITRAL VALVE               TRICUSPID VALVE MV Area (PHT): 4.49 cm    TR Peak grad:   23.0 mmHg MV Decel Time: 169 msec    TR Vmax:        240.00 cm/s MV E velocity: 76.70 cm/s                            SHUNTS                            Systemic VTI:  0.13 m                            Systemic Diam: 2.10 cm Phineas Inches Electronically signed by Phineas Inches Signature Date/Time: 04/13/2022/11:44:33 AM    Final    CT Angio Chest Pulmonary Embolism (PE) W or WO Contrast  Result Date: 04/12/2022 CLINICAL DATA:  Pulmonary embolism (PE) suspected, high prob. Cough. Difficulty breathing. EXAM: CT ANGIOGRAPHY CHEST WITH CONTRAST TECHNIQUE: Multidetector CT imaging of the chest was performed using the standard protocol during bolus administration of intravenous contrast. Multiplanar CT image reconstructions and MIPs were obtained to evaluate the vascular anatomy. RADIATION DOSE REDUCTION: This exam was performed according to the departmental dose-optimization program which includes automated exposure control, adjustment of the mA and/or kV according to patient size and/or use of iterative reconstruction technique. CONTRAST:  37m OMNIPAQUE IOHEXOL 350 MG/ML SOLN COMPARISON:  01/21/2022 FINDINGS: Cardiovascular: Filling defects in the right lower lobe pulmonary arteries compatible with pulmonary emboli. No evidence of right heart strain. Cardiomegaly. Prominent central pulmonary arteries suggest pulmonary arterial hypertension. Coronary artery and aortic calcifications. No aneurysm. Mediastinum/Nodes: No mediastinal, hilar, or axillary adenopathy. Trachea and esophagus are unremarkable. Thyroid unremarkable. Lungs/Pleura: Centrilobular emphysema. Ground-glass opacities within the upper lung zones. No confluent opacities or effusions. Upper Abdomen: No acute findings Musculoskeletal: Chest wall soft tissues are unremarkable. No acute bony abnormality. Review of the MIP images confirms the above findings. IMPRESSION:  Positive for acute pulmonary emboli in the right lower lobe. Cardiomegaly, coronary artery disease. Prominent central pulmonary arteries suggest pulmonary arterial hypertension. Aortic Atherosclerosis (ICD10-I70.0) and Emphysema (ICD10-J43.9). Electronically Signed   By: Rolm Baptise M.D.   On: 04/12/2022 22:20   DG Chest Port 1 View  Result Date: 04/12/2022 CLINICAL DATA:  Family states patient has completed x2 courses of abx for a uti but it returns when course is complete. Pt has been confused and having hallucinations for the past 10 days. EXAM: PORTABLE CHEST 1 VIEW COMPARISON:  CT, 01/21/2022.  Chest radiograph, 12/06/2021. FINDINGS: Cardiac silhouette borderline enlarged. No mediastinal or hilar masses. Low lung volumes. Coarse bilateral interstitial thickening. Intervening hazy airspace opacities no left mid and upper lung and right upper lung, new compared to the prior radiographs. No convincing pleural effusion.  No pneumothorax. Skeletal structures are grossly intact. IMPRESSION: 1. Hazy bilateral airspace lung opacities superimposed on chronic interstitial thickening. Findings suspicious for multifocal pneumonia. Electronically Signed   By: Lajean Manes M.D.   On: 04/12/2022 17:22        Scheduled Meds:  aspirin EC  81 mg Oral Daily   atenolol  12.5 mg Oral QHS   polyethylene glycol  17 g Oral Daily   pravastatin  20 mg Oral QHS   senna-docusate  1 tablet Oral BID   Continuous Infusions:  heparin 1,400 Units/hr (04/14/22 0321)     LOS: 1 day    Time spent: 35 minutes    Hunter Bachar A Chasten Blaze, MD [facility]   If 7PM-7AM, please contact night-coverage [email or web address]  04/14/2022, 10:46 AM

## 2022-04-14 NOTE — TOC Benefit Eligibility Note (Signed)
Patient Research scientist (life sciences) completed.     The patient is currently admitted and upon discharge could be taking Xarelto or Eliquis.   The current 30 day co-pay is, $45 for both.   The patient is insured through [employer].

## 2022-04-14 NOTE — Progress Notes (Signed)
Daily Progress Note   Patient Name: Natalie West       Date: 04/14/2022 DOB: 1930-12-31  Age: 88 y.o. MRN#: [id number] Attending Physician: Elmarie Shiley, MD Primary Care Physician: Christain Sacramento, MD Admit Date: 04/12/2022  Reason for Consultation/Follow-up: Establishing goals of care  Subjective: Medical records reviewed including progress notes, labs. Patient assessed at the bedside.  She is in better spirits and reports feeling better today.  Asking for assistance removing her dentures.  Her caregiver Anaya present at the bedside.  Created space and opportunity for patient's thoughts and feelings in her current illness.  We reviewed updates including DVTs and plan for blood thinner, risk for complications from any further falls, and importance of goals of care conversations and sharing her care preferences with her family and the care team.  She tells me that she thinks a lot about her son, how he will be taken care of if anything happens to her and if she has done enough to support him throughout her life.  Emotional support and therapeutic listening was provided.  We discussed options including aggressive care versus comfort care, what hospice at home would look like.  She is confused, thinks her caregiver is her relative.  She is clear however that she does want a DNR order, she would not want a feeding tube, and would rather be at home focusing on her comfort rather than be readmitted to the hospital.  She would like more treatment for her anxiety at night, as she often fears she will die alone.  She would also like more pain management for her bedsores.  She wishes to discuss with a chaplain, as she fears she is not saved and is unsure if she would go to heaven when she passes.  I called  patient's HCPOA Greg to provide updates on the above conversation, encouraging him to consider her goals and wishes and transition to comfort care in the hospital while arranging for hospice at home.  This is difficult for him to think about, as her sisters have lived well into their 21s and he feels like she is not quite there yet.  He does agree she will need 24/7 care moving forward. he is in the process of working on arranging this with a lawyer to ensure they can cover the  expense.  He estimates that she could continue living at home with 24/7 care for about 3 months.  I shared that if her goal is comfort, this would likely be sufficient until she would need residential hospice support.  Hospice services in various settings were explained thoroughly and recommended.  [name] tells me he is on anticoagulation himself and after his mother had complications from not being on these medicines, he would like to continue with the current care plan.  He does realize that hospice will likely need to be discussed soon after she returns home.  He plans to visit in the coming days and we made plans to continue the conversation tomorrow.  Questions and concerns addressed. PMT will continue to support holistically.   Length of Stay: 1   Physical Exam Vitals and nursing note reviewed.  Constitutional:      General: She is not in acute distress.    Appearance: She is ill-appearing.     Interventions: Nasal cannula in place.  Cardiovascular:     Rate and Rhythm: Normal rate.  Pulmonary:     Effort: Pulmonary effort is normal.  Skin:    General: Skin is cool and dry.  Neurological:     Mental Status: She is alert.  Psychiatric:        Mood and Affect: Mood normal.        Behavior: Behavior is cooperative.             Vital Signs: BP 103/62 (BP Location: Right Arm)   Pulse 84   Temp 98.1 F (36.7 C) (Oral)   Resp 18   Ht '5\' 2"'$  (1.575 m)   Wt 56.5 kg   LMP 11/03/1983   SpO2 98%   BMI 22.78 kg/m   SpO2: SpO2: 98 % O2 Device: O2 Device: Nasal Cannula O2 Flow Rate: O2 Flow Rate (L/min): 2 L/min      Palliative Assessment/Data:      Palliative Care Assessment & Plan   Patient Profile: 88 y.o. female  with past medical history of atrial fibrillation not on anticoagulation with risk of falls, pulmonary hypertension, hyperlipidemia, breast cancer, admitted on 04/12/2022 with hypoxia and dyspnea noted during PT visit at home.      Patient was recently admitted in March after a fall with a C-spine and right knee fracture, was discharged to rehab and returned home recently.  Has had ongoing decline since then.  PMT has been consulted to assist with goals of care conversation.  Assessment: Acute PE Acute DVTs Acute respiratory failure with hypoxia Debility Failure to thrive Goals of care conversation  Recommendations/Plan: DNR Patient expresses her wish for comfort care, however her HCPOA would like to continue with the current care plan and continue conversation regarding hospice as an outpatient Spiritual care consult appreciated per patient request Psychosocial emotional support provided PMT will continue to follow   Prognosis: Poor prognosis with failure to thrive and multiple acute and chronic illnesses  Discharge Planning: Home with [place] was discussed with patient, patient's caregiver, patient's Ninfa Linden, Dr. Tyrell Antonio   MDM high         Marjan Rosman Johnnette Litter, PA-C  Palliative Medicine Team Team phone # [phone]  Thank you for allowing the Palliative Medicine Team to assist in the care of this patient. Please utilize secure chat with additional questions, if there is no response within 30 minutes please call the above phone number.  Palliative Medicine Team providers are available by phone  from 7am to 7pm daily and can be reached through the team cell phone.  Should this patient require assistance outside of these hours, please call the  patient's attending physician.

## 2022-04-14 NOTE — Progress Notes (Signed)
ANTICOAGULATION CONSULT NOTE  Pharmacy Consult for heparin Indication: pulmonary embolus  Allergies  Allergen Reactions   Ciprofloxacin Other (See Comments)    Reaction unknown   Clindamycin/Lincomycin Other (See Comments)    Reaction unknown    Codeine Nausea And Vomiting   Diclofenac Other (See Comments)    Reaction unknown    Fenoprofen Calcium Other (See Comments)    Reaction unknown    Flexeril [Cyclobenzaprine Hcl] Other (See Comments)    Reaction unknown    Hydrocodone Other (See Comments)    "Last time it was too strong and messed up my mind"   Macrobid [Nitrofurantoin] Other (See Comments)    Reaction unknown    Noroxin [Norfloxacin] Other (See Comments)    Reaction unknown    Relafen [Nabumetone] Other (See Comments)    Reaction unknown    Sulfa Antibiotics Other (See Comments)    Reaction unknown     Patient Measurements: Height: '5\' 2"'$  (157.5 cm) Weight: 56.5 kg (124 lb 9 oz) IBW/kg (Calculated) : 50.1 Heparin Dosing Weight: 59 Kg  Vital Signs: Temp: 98.1 F (36.7 C) (06/13 1230) Temp Source: Oral (06/13 1230) BP: 103/62 (06/13 1230) Pulse Rate: 84 (06/13 1230)  Labs: Recent Labs    04/12/22 1641 04/12/22 2311 04/13/22 0606 04/13/22 0823 04/13/22 1927 04/14/22 0053 04/14/22 1159  HGB 11.8*  --  10.9*  --   --  10.5*  --   HCT 38.1  --  34.5*  --   --  33.9*  --   PLT 197  --  167  --   --  169  --   APTT 32  --   --   --   --   --   --   LABPROT 17.1*  --   --   --   --   --   --   INR 1.4*  --   --   --   --   --   --   HEPARINUNFRC  --   --   --    < > 0.18* 0.38 0.35  CREATININE 0.89  --  0.80  --   --  0.89  --   TROPONINIHS  --  5  --   --   --   --   --    < > = values in this interval not displayed.     Estimated Creatinine Clearance: 34.6 mL/min (by C-G formula based on SCr of 0.89 mg/dL).   Assessment:  89 yoF on IV heparin for PE. Heparin level at goal this afternoon.  No overt bleeding or complications noted.   Discussed with Dr. Tyrell Antonio, will continue IV heparin x another 24 hrs, then convert to [place].  Both Eliquis and Xarelto are $45 with her insurance.  Goal of Therapy:  Heparin level 0.3-0.7 units/ml Monitor platelets by anticoagulation protocol: Yes   Plan:  Continue IV heparin at 1400 units/hr. Daily heparin level and CBC. F/u plans for oral anticoagulation 6/14.   [place] Crane, Roylene Reason, BCCP Clinical Pharmacist  04/14/2022 1:26 PM   [facility] pharmacy phone numbers are listed on [email or web address]

## 2022-04-15 DIAGNOSIS — I5031 Acute diastolic (congestive) heart failure: Secondary | ICD-10-CM | POA: Diagnosis not present

## 2022-04-15 DIAGNOSIS — I2699 Other pulmonary embolism without acute cor pulmonale: Secondary | ICD-10-CM | POA: Diagnosis not present

## 2022-04-15 LAB — CBC
HCT: 33.4 % — ABNORMAL LOW (ref 36.0–46.0)
Hemoglobin: 11 g/dL — ABNORMAL LOW (ref 12.0–15.0)
MCH: 33.6 pg (ref 26.0–34.0)
MCHC: 32.9 g/dL (ref 30.0–36.0)
MCV: 102.1 fL — ABNORMAL HIGH (ref 80.0–100.0)
Platelets: 164 10*3/uL (ref 150–400)
RBC: 3.27 MIL/uL — ABNORMAL LOW (ref 3.87–5.11)
RDW: 14.9 % (ref 11.5–15.5)
WBC: 10.6 10*3/uL — ABNORMAL HIGH (ref 4.0–10.5)
nRBC: 0 % (ref 0.0–0.2)

## 2022-04-15 LAB — URINE CULTURE: Culture: >=100000 — AB

## 2022-04-15 LAB — HEPARIN LEVEL (UNFRACTIONATED): Heparin Unfractionated: 0.69 IU/mL (ref 0.30–0.70)

## 2022-04-15 MED ORDER — APIXABAN 5 MG PO TABS
10.0000 mg | ORAL_TABLET | Freq: Two times a day (BID) | ORAL | Status: DC
  Administered 2022-04-15 – 2022-04-16 (×3): 10 mg via ORAL
  Filled 2022-04-15 (×3): qty 2

## 2022-04-15 MED ORDER — APIXABAN 5 MG PO TABS: 5.0000 mg | ORAL_TABLET | Freq: Two times a day (BID) | ORAL | Status: DC

## 2022-04-15 MED ORDER — MEGESTROL ACETATE 400 MG/10ML PO SUSP
400.0000 mg | Freq: Two times a day (BID) | ORAL | Status: DC
  Administered 2022-04-15 – 2022-04-16 (×2): 400 mg via ORAL
  Filled 2022-04-15 (×3): qty 10

## 2022-04-15 MED ORDER — PANTOPRAZOLE SODIUM 40 MG PO TBEC
40.0000 mg | DELAYED_RELEASE_TABLET | Freq: Every day | ORAL | Status: DC
  Administered 2022-04-15 – 2022-04-16 (×2): 40 mg via ORAL
  Filled 2022-04-15 (×2): qty 1

## 2022-04-15 NOTE — Progress Notes (Signed)
ANTICOAGULATION CONSULT NOTE   Pharmacy Consult for Heparin to Apixaban Indication: acute PE and bilateral lower extremity DVT  Allergies  Allergen Reactions   Ciprofloxacin Other (See Comments)    Reaction unknown   Clindamycin/Lincomycin Other (See Comments)    Reaction unknown    Codeine Nausea And Vomiting   Diclofenac Other (See Comments)    Reaction unknown    Fenoprofen Calcium Other (See Comments)    Reaction unknown    Flexeril [Cyclobenzaprine Hcl] Other (See Comments)    Reaction unknown    Hydrocodone Other (See Comments)    "Last time it was too strong and messed up my mind"   Macrobid [Nitrofurantoin] Other (See Comments)    Reaction unknown    Noroxin [Norfloxacin] Other (See Comments)    Reaction unknown    Relafen [Nabumetone] Other (See Comments)    Reaction unknown    Sulfa Antibiotics Other (See Comments)    Reaction unknown     Patient Measurements: Height: '5\' 2"'$  (157.5 cm) Weight: 52.9 kg (116 lb 10 oz) IBW/kg (Calculated) : 50.1   Vital Signs: Temp: 98.1 F (36.7 C) (06/14 0510) Temp Source: Oral (06/14 0510) BP: 132/74 (06/14 0808) Pulse Rate: 72 (06/14 0808)  Labs: Recent Labs    04/12/22 1641 04/12/22 2311 04/13/22 0606 04/13/22 0823 04/14/22 0053 04/14/22 1159 04/15/22 0240  HGB 11.8*  --  10.9*  --  10.5*  --  11.0*  HCT 38.1  --  34.5*  --  33.9*  --  33.4*  PLT 197  --  167  --  169  --  164  APTT 32  --   --   --   --   --   --   LABPROT 17.1*  --   --   --   --   --   --   INR 1.4*  --   --   --   --   --   --   HEPARINUNFRC  --   --   --    < > 0.38 0.35 0.69  CREATININE 0.89  --  0.80  --  0.89  --   --   TROPONINIHS  --  5  --   --   --   --   --    < > = values in this interval not displayed.    Estimated Creatinine Clearance: 34.6 mL/min (by C-G formula based on SCr of 0.89 mg/dL).   Assessment: 89 yoF on IV heparin for PE. Heparin level at goal this afternoon.  No overt bleeding or complications noted.  Pharmacy consulted to transition patient from heparin infusion to apixaban. CBC stable, no signs bleeding or issues with heparin infusion reported.  Of note, both Eliquis and Xarelto are $45 with her insurance.  Goal of Therapy:  Heparin level 0.3-0.7 units/ml Monitor platelets by anticoagulation protocol: Yes    Plan:  Stop heparin infusion at 1200  Start apixaban '10mg'$  BID for 7 days, followed by apixaban '5mg'$  BID thereafter, first dose at 1200 Continue to monitor H&H and platelets    Thank you for allowing pharmacy to be a part of this patient's care.  Ardyth Harps, PharmD Clinical Pharmacist

## 2022-04-15 NOTE — Progress Notes (Addendum)
PROGRESS NOTE    Natalie West  QIW:[id number] DOB: 1931/04/07 DOA: 04/12/2022 PCP: Christain Sacramento, MD    Subjective: The patient was seen and examined this morning, laying in bed comfortable, caregiver at bedside Still reporting of poor p.o. intake, Vitals are stable, satting 96% on room air  Brief Narrative: 88 year old with past medical history significant for A-fib not on anticoagulation due to risk for fall, pulmonary hypertension, hyperlipidemia who was recently admitted to the hospital in March for C-spine and right knee fracture was discharged to rehab and recently discharged from rehab to  home, she was found to be hypoxic after physical therapy came to visit patient.  Patient was found to have acute PE and bilateral lower extremity DVT.  she was started on heparin gtt, plan to continue with heparin gtt for another 24 hours.    Assessment & Plan:   Principal Problem:   Acute pulmonary embolism (HCC) Active Problems:   Hyperlipidemia   Breast cancer of lower-outer quadrant of right female breast ([place])   Acute diastolic heart failure (HCC)   Pulmonary emboli (HCC)   Pressure injury of skin   1-Acute pulmonary emboli: Right lower lobe -CT angio positive for acute PE right lower lobe -Bilateral lower extremity doppler positive for DVT -She has been heparin gtt for last 24 hours, transitioning to NOAC: Eliquis today Anticipated patient to be treated with Eliquis for at least 3 months, further treatment plan and extension of the plan per PCP and pulmonology as an outpatient -ECHO; RV function normal.   2-BL DVT: acute DVT involving the right common femoral vein and SF junction, acute DVT left common femoral vein, SNF, left femoral vein, left proximal profunda vein and left popliteal vein: -Status post treatment with heparin drip for past 24 hours, transitioning to NOAC Transition to NOAC: Eliquis on 6/14.   3-History of A-fib: Continue with Atenolol.  She was not on  anticoagulation due to risk for fall.  Initiating Eliquis: As at this point benefits outweighs the risk due to above findings  PNA Clinically Undetermined  Recent admission for C-spine fracture and right patella fracture: Managed conservatively.   Constipation;  Started senna-docusate. Miralax.   History of hyperlipidemia: Continue with statin  History of prior breast cancer: In remission Hypokalemia; replaced.   Failure to thrive:  She has been declining over the last 3 months.  Palliative care consulted. Per palliative notes, continue with full scope treatments, CODE STATUS changed to DNR.  Palliative care will continue to follow-up. Will need to determine if patient needs 24 hours care.  Continue poor p.o. intake, cachexia-adding Megace     Pressure Injury 04/13/22 Sacrum Medial Stage 2 -  Partial thickness loss of dermis presenting as a shallow open injury with a red, pink wound bed without slough. (Active)  04/13/22 1930  Location: Sacrum  Location Orientation: Medial  Staging: Stage 2 -  Partial thickness loss of dermis presenting as a shallow open injury with a red, pink wound bed without slough.  Wound Description (Comments):   Present on Admission: Yes  Dressing Type Foam - Lift dressing to assess site every shift 04/15/22 0740     Pressure Injury 04/13/22 Vertebral column Medial Stage 2 -  Partial thickness loss of dermis presenting as a shallow open injury with a red, pink wound bed without slough. (Active)  04/13/22 1930  Location: Vertebral column  Location Orientation: Medial  Staging: Stage 2 -  Partial thickness loss of dermis presenting as a shallow  open injury with a red, pink wound bed without slough.  Wound Description (Comments):   Present on Admission: Yes  Dressing Type Foam - Lift dressing to assess site every shift 04/15/22 0740      Estimated body mass index is 21.33 kg/m as calculated from the following:   Height as of this encounter: '5\' 2"'$   (1.575 m).   Weight as of this encounter: 52.9 kg.   DVT prophylaxis: heparin Gtt Code Status: DNR Family Communication: caregiver at bedside. POA spoke with palliative 6/12. Disposition Plan:  Status is: Inpatient Remains inpatient appropriate because: remain in heparin Gtt for DVT/PE--will be transition to p.o. coag, likely discharge home with home health in next 24 hours if stable    Consultants:  Palliative  Procedures:  ECHO  Antimicrobials:        Objective: Vitals:   04/15/22 0510 04/15/22 0745 04/15/22 0808 04/15/22 1250  BP: 140/72  132/74 106/73  Pulse: 82  72 94  Resp: '18  17 18  '$ Temp: 98.1 F (36.7 C)     TempSrc: Oral     SpO2: 99% 94% 92% 96%  Weight: 52.9 kg     Height:        Intake/Output Summary (Last 24 hours) at 04/15/2022 1501 Last data filed at 04/15/2022 1417 Gross per 24 hour  Intake --  Output 1700 ml  Net -1700 ml   Filed Weights   04/12/22 1629 04/14/22 0416 04/15/22 0510  Weight: 59 kg 56.5 kg 52.9 kg    Physical Exam:   General:  Severe generalized cachexia, AAO x 3,  cooperative, no distress;   HEENT:  Normocephalic, PERRL, otherwise with in Normal limits   Neuro:  CNII-XII intact. , normal motor and sensation, reflexes intact   Lungs:   Clear to auscultation BL, Respirations unlabored,  No wheezes / crackles  Cardio:    S1/S2, RRR, No murmure, No Rubs or Gallops   Abdomen:  Soft, non-tender, bowel sounds active all four quadrants, no guarding or peritoneal signs.  Muscular  skeletal:  Generalized muscle wasting,  Limited exam -global generalized weaknesses - in bed, able to move all 4 extremities,   2+ pulses,  symmetric, No pitting edema  Skin:  Dry, warm to touch, negative for any Rashes,  Wounds: Please see nursing documentation  Pressure Injury 04/13/22 Sacrum Medial Stage 2 -  Partial thickness loss of dermis presenting as a shallow open injury with a red, pink wound bed without slough. (Active)  04/13/22 1930   Location: Sacrum  Location Orientation: Medial  Staging: Stage 2 -  Partial thickness loss of dermis presenting as a shallow open injury with a red, pink wound bed without slough.  Wound Description (Comments):   Present on Admission: Yes  Dressing Type Foam - Lift dressing to assess site every shift 04/15/22 0740     Pressure Injury 04/13/22 Vertebral column Medial Stage 2 -  Partial thickness loss of dermis presenting as a shallow open injury with a red, pink wound bed without slough. (Active)  04/13/22 1930  Location: Vertebral column  Location Orientation: Medial  Staging: Stage 2 -  Partial thickness loss of dermis presenting as a shallow open injury with a red, pink wound bed without slough.  Wound Description (Comments):   Present on Admission: Yes  Dressing Type Foam - Lift dressing to assess site every shift 04/15/22 0740           Data Reviewed: I have personally reviewed following labs and  imaging studies  CBC: Recent Labs  Lab 04/12/22 1641 04/13/22 0606 04/14/22 0053 04/15/22 0240  WBC 12.6* 10.7* 10.3 10.6*  NEUTROABS 10.0*  --   --   --   HGB 11.8* 10.9* 10.5* 11.0*  HCT 38.1 34.5* 33.9* 33.4*  MCV 102.7* 103.9* 103.7* 102.1*  PLT 197 167 169 144   Basic Metabolic Panel: Recent Labs  Lab 04/12/22 1641 04/13/22 0606 04/14/22 0053  NA 139 139 140  K 3.7 3.4* 4.0  CL 108 110 110  CO2 24 21* 22  GLUCOSE 110* 104* 88  BUN '20 20 20  '$ CREATININE 0.89 0.80 0.89  CALCIUM 8.3* 8.1* 8.6*   GFR: Estimated Creatinine Clearance: 34.6 mL/min (by C-G formula based on SCr of 0.89 mg/dL). Liver Function Tests: Recent Labs  Lab 04/12/22 1641 04/13/22 0606  AST 18 18  ALT 16 10  ALKPHOS 83 69  BILITOT 0.7 1.1  PROT 6.9 5.8*  ALBUMIN 3.1* 2.7*    Coagulation Profile: Recent Labs  Lab 04/12/22 1641  INR 1.4*    Sepsis Labs: Recent Labs  Lab 04/12/22 1641  PROCALCITON <0.10  LATICACIDVEN 1.9    Recent Results (from the past 240 hour(s))   Blood Culture (routine x 2)     Status: None (Preliminary result)   Collection Time: 04/12/22  4:41 PM   Specimen: BLOOD LEFT FOREARM  Result Value Ref Range Status   Specimen Description BLOOD LEFT FOREARM  Final   Special Requests   Final    BOTTLES DRAWN AEROBIC AND ANAEROBIC Blood Culture results may not be optimal due to an excessive volume of blood received in culture bottles   Culture   Final    NO GROWTH 3 DAYS Performed at [facility], [place] [address], [place], [place] [address]    Report Status PENDING  Incomplete  Urine Culture     Status: Abnormal   Collection Time: 04/12/22  4:41 PM   Specimen: Urine, Clean Catch  Result Value Ref Range Status   Specimen Description URINE, CLEAN CATCH  Final   Special Requests   Final    NONE Performed at [facility], [place] [address] [place], [place] [address]    Culture >=100,000 COLONIES/mL KLEBSIELLA PNEUMONIAE (A)  Final   Report Status 04/15/2022 FINAL  Final   Organism ID, Bacteria KLEBSIELLA PNEUMONIAE (A)  Final      Susceptibility   Klebsiella pneumoniae - MIC*    AMPICILLIN >=32 RESISTANT Resistant     CEFAZOLIN <=4 SENSITIVE Sensitive     CEFEPIME <=0.12 SENSITIVE Sensitive     CEFTRIAXONE <=0.25 SENSITIVE Sensitive     CIPROFLOXACIN <=0.25 SENSITIVE Sensitive     GENTAMICIN <=1 SENSITIVE Sensitive     IMIPENEM <=0.25 SENSITIVE Sensitive     NITROFURANTOIN 32 SENSITIVE Sensitive     TRIMETH/SULFA <=20 SENSITIVE Sensitive     AMPICILLIN/SULBACTAM 8 SENSITIVE Sensitive     PIP/TAZO 8 SENSITIVE Sensitive     * >=100,000 COLONIES/mL KLEBSIELLA PNEUMONIAE  Blood Culture (routine x 2)     Status: None (Preliminary result)   Collection Time: 04/12/22  4:46 PM   Specimen: BLOOD LEFT FOREARM  Result Value Ref Range Status   Specimen Description BLOOD LEFT FOREARM  Final   Special Requests   Final    BOTTLES DRAWN AEROBIC AND ANAEROBIC Blood Culture results may not be optimal due to an excessive volume of blood  received in culture bottles   Culture   Final  NO GROWTH 3 DAYS Performed at [facility], [place] [address], [place], [place] [address]    Report Status PENDING  Incomplete  Resp Panel by RT-PCR (Flu A&B, Covid)     Status: None   Collection Time: 04/12/22  5:31 PM   Specimen: Nasal Swab  Result Value Ref Range Status   SARS Coronavirus 2 by RT PCR NEGATIVE NEGATIVE Final    Comment: (NOTE) SARS-CoV-2 target nucleic acids are NOT DETECTED.  The SARS-CoV-2 RNA is generally detectable in upper respiratory specimens during the acute phase of infection. The lowest concentration of SARS-CoV-2 viral copies this assay can detect is 138 copies/mL. A negative result does not preclude SARS-Cov-2 infection and should not be used as the sole basis for treatment or other patient management decisions. A negative result may occur with  improper specimen collection/handling, submission of specimen other than nasopharyngeal swab, presence of viral mutation(s) within the areas targeted by this assay, and inadequate number of viral copies(<138 copies/mL). A negative result must be combined with clinical observations, patient history, and epidemiological information. The expected result is Negative.  Fact Sheet for Patients:  EntrepreneurPulse.com.au  Fact Sheet for Healthcare Providers:  IncredibleEmployment.be  This test is no t yet approved or cleared by the [place] FDA and  has been authorized for detection and/or diagnosis of SARS-CoV-2 by FDA under an Emergency Use Authorization (EUA). This EUA will remain  in effect (meaning this test can be used) for the duration of the COVID-19 declaration under Section 564(b)(1) of the Act, 21 U.S.C.section 360bbb-3(b)(1), unless the authorization is terminated  or revoked sooner.       Influenza A by PCR NEGATIVE NEGATIVE Final   Influenza B by PCR NEGATIVE NEGATIVE Final    Comment: (NOTE) The Xpert  Xpress SARS-CoV-2/FLU/RSV plus assay is intended as an aid in the diagnosis of influenza from Nasopharyngeal swab specimens and should not be used as a sole basis for treatment. Nasal washings and aspirates are unacceptable for Xpert Xpress SARS-CoV-2/FLU/RSV testing.  Fact Sheet for Patients: EntrepreneurPulse.com.au  Fact Sheet for Healthcare Providers: IncredibleEmployment.be  This test is not yet approved or cleared by the [place] FDA and has been authorized for detection and/or diagnosis of SARS-CoV-2 by FDA under an Emergency Use Authorization (EUA). This EUA will remain in effect (meaning this test can be used) for the duration of the COVID-19 declaration under Section 564(b)(1) of the Act, 21 U.S.C. section 360bbb-3(b)(1), unless the authorization is terminated or revoked.  Performed at [facility], [place] [address], [place], [place] [address]   MRSA Next Gen by PCR, Nasal     Status: None   Collection Time: 04/12/22 11:11 PM   Specimen: Nasal Swab  Result Value Ref Range Status   MRSA by PCR Next Gen NOT DETECTED NOT DETECTED Final    Comment: (NOTE) The GeneXpert MRSA Assay (FDA approved for NASAL specimens only), is one component of a comprehensive MRSA colonization surveillance program. It is not intended to diagnose MRSA infection nor to guide or monitor treatment for MRSA infections. Test performance is not FDA approved in patients less than 32 years old. Performed at [facility], [place] [address], [place], [place] [address]          Radiology Studies: No results found.      Scheduled Meds:  apixaban  10 mg Oral BID   Followed by   Derrill Memo ON 04/22/2022] apixaban  5 mg Oral BID   atenolol  12.5 mg Oral QHS   pantoprazole  40 mg Oral Daily   polyethylene glycol  17 g Oral Daily   pravastatin  20 mg Oral QHS   senna-docusate  1 tablet Oral BID   Continuous Infusions:   LOS: 2 days   Time  spent: 35 minutes    Deatra James, MD [facility] If 7PM-7AM, please contact night-coverage [email or web address]  04/15/2022, 3:01 PM

## 2022-04-15 NOTE — Discharge Instructions (Signed)
Information on my medicine - ELIQUIS (apixaban)  This medication education was reviewed with me or my healthcare representative as part of my discharge preparation.   Why was Eliquis prescribed for you? Eliquis was prescribed to treat blood clots that may have been found in the veins of your legs (deep vein thrombosis) or in your lungs (pulmonary embolism) and to reduce the risk of them occurring again.  What do You need to know about Eliquis ? The starting dose is 10 mg (two 5 mg tablets) taken TWICE daily for the FIRST SEVEN (7) DAYS, then on (enter date)  04/22/2022  the dose is reduced to ONE 5 mg tablet taken TWICE daily.  Eliquis may be taken with or without food.   Try to take the dose about the same time in the morning and in the evening. If you have difficulty swallowing the tablet whole please discuss with your pharmacist how to take the medication safely.  Take Eliquis exactly as prescribed and DO NOT stop taking Eliquis without talking to the doctor who prescribed the medication.  Stopping may increase your risk of developing a new blood clot.  Refill your prescription before you run out.  After discharge, you should have regular check-up appointments with your healthcare provider that is prescribing your Eliquis.    What do you do if you miss a dose? If a dose of ELIQUIS is not taken at the scheduled time, take it as soon as possible on the same day and twice-daily administration should be resumed. The dose should not be doubled to make up for a missed dose.  Important Safety Information A possible side effect of Eliquis is bleeding. You should call your healthcare provider right away if you experience any of the following: Bleeding from an injury or your nose that does not stop. Unusual colored urine (red or dark brown) or unusual colored stools (red or black). Unusual bruising for unknown reasons. A serious fall or if you hit your head (even if there is no  bleeding).  Some medicines may interact with Eliquis and might increase your risk of bleeding or clotting while on Eliquis. To help avoid this, consult your healthcare provider or pharmacist prior to using any new prescription or non-prescription medications, including herbals, vitamins, non-steroidal anti-inflammatory drugs (NSAIDs) and supplements.  This website has more information on Eliquis (apixaban): http://www.eliquis.com/eliquis/home

## 2022-04-15 NOTE — Plan of Care (Signed)
  Problem: Health Behavior/Discharge Planning: Goal: Ability to manage health-related needs will improve Outcome: Progressing   Problem: Clinical Measurements: Goal: Ability to maintain clinical measurements within normal limits will improve Outcome: Progressing Goal: Respiratory complications will improve Outcome: Progressing   Problem: Activity: Goal: Risk for activity intolerance will decrease Outcome: Progressing   Problem: Elimination: Goal: Will not experience complications related to bowel motility Outcome: Progressing Goal: Will not experience complications related to urinary retention Outcome: Progressing

## 2022-04-15 NOTE — Progress Notes (Signed)
Daily Progress Note   Patient Name: Natalie West       Date: 04/15/2022 DOB: 08-24-1931  Age: 88 y.o. MRN#: [id number] Attending Physician: Deatra James, MD Primary Care Physician: Christain Sacramento, MD Admit Date: 04/12/2022  Reason for Consultation/Follow-up: Establishing goals of care  Subjective: Medical records reviewed including progress notes, labs. Patient assessed at the bedside.   Her caregiver Anaya present at the bedside. Achol reports feeling poorly today, states "my health is just not good."  Explored her thoughts and feelings on her current illness, identifying sources of discomfort and distress. She tells me that "my body as a whole" is uncomfortable and again mentions the two sores on her back side. I provided her with update that I did not yet add comfort medications to her list or change her care plan. Yesterday when we talked, she was ready for comfort care and hospice. However, I spoke with her nephew/HCPOA [name] and he was not ready to shift just yet. I shared with her that a palliative care provider would be visiting at home to continue this important conversation with herself and her family. Provided reassurance that if she does not improve or gets worse, comfort care and hospice would still be a very reasonable option. Anaya tells me that [name] will be visiting tomorrow. Enza continues to voice that she would like to speak with a chaplain for spiritual support.   Evalee Jefferson for ongoing palliative support and goals of care conversation. Provided update on transition to Eliquis today and patient's above statements. He verbalizes his understands and agrees outpatient palliative care would be helpful to continue monitoring her progress and supporting her. I reiterated the importance  of prioritizing patient's values and preferences, as she would likely benefit from hospice support. [name] has arranged for 24/7 care with home health when she returns home tomorrow.  Questions and concerns addressed. PMT will continue to support holistically.   Length of Stay: 2   Physical Exam Vitals and nursing note reviewed.  Constitutional:      General: She is not in acute distress.    Appearance: She is ill-appearing.     Interventions: Nasal cannula in place.  Cardiovascular:     Rate and Rhythm: Normal rate.  Pulmonary:     Effort: Pulmonary effort is normal.  Skin:  General: Skin is cool and dry.  Neurological:     Mental Status: She is alert.  Psychiatric:        Mood and Affect: Mood normal.        Behavior: Behavior is cooperative.             Vital Signs: BP 132/74 (BP Location: Left Arm)   Pulse 72   Temp 98.1 F (36.7 C) (Oral)   Resp 17   Ht '5\' 2"'$  (1.575 m)   Wt 52.9 kg   LMP 11/03/1983   SpO2 92%   BMI 21.33 kg/m  SpO2: SpO2: 92 % O2 Device: O2 Device: Room Air O2 Flow Rate: O2 Flow Rate (L/min): 2 L/min      Palliative Assessment/Data: 30%      Palliative Care Assessment & Plan   Patient Profile: 88 y.o. female  with past medical history of atrial fibrillation not on anticoagulation with risk of falls, pulmonary hypertension, hyperlipidemia, breast cancer, admitted on 04/12/2022 with hypoxia and dyspnea noted during PT visit at home.      Patient was recently admitted in March after a fall with a C-spine and right knee fracture, was discharged to rehab and returned home recently.  Has had ongoing decline since then.  PMT has been consulted to assist with goals of care conversation.  Assessment: Acute PE Acute DVTs Acute respiratory failure with hypoxia Debility Failure to thrive Goals of care conversation  Recommendations/Plan: DNR Continue current care Outpatient palliative care referral at discharge  Psychosocial and emotional  support provided PMT will continue to follow   Prognosis: Poor prognosis with failure to thrive and multiple acute and chronic illnesses  Discharge Planning: Home with Home Health and [place] plan was discussed with patient, patient's caregiver, patient's HCPOA Greg   Total time: I spent 40 minutes in the care of the patient today in the above activities and documenting the encounter.   Dorthy Cooler, PA-C Palliative Medicine Team Team phone # [phone]  Thank you for allowing the Palliative Medicine Team to assist in the care of this patient. Please utilize secure chat with additional questions, if there is no response within 30 minutes please call the above phone number.  Palliative Medicine Team providers are available by phone from 7am to 7pm daily and can be reached through the team cell phone.  Should this patient require assistance outside of these hours, please call the patient's attending physician.

## 2022-04-15 NOTE — Progress Notes (Signed)
   04/15/22 1445  Clinical Encounter Type  Visited With Patient  Visit Type Initial;Spiritual support  Referral From Nurse  Consult/Referral To Chaplain   Chaplain responded to a spiritual consult request for prayer. I visited with the patient, Natalie West, for a while. [name] expressed fear with both her words and her expression. She expresses that fear as being alone. I provided comfort and spiritual support to the patient assuring her she is not alone. I also moved the curtain back so she could see the hallway and left the door open.   Danice Goltz  [facility]  [phone]

## 2022-04-16 DIAGNOSIS — I5031 Acute diastolic (congestive) heart failure: Secondary | ICD-10-CM | POA: Diagnosis not present

## 2022-04-16 DIAGNOSIS — L89152 Pressure ulcer of sacral region, stage 2: Secondary | ICD-10-CM | POA: Diagnosis not present

## 2022-04-16 DIAGNOSIS — E785 Hyperlipidemia, unspecified: Secondary | ICD-10-CM | POA: Diagnosis not present

## 2022-04-16 DIAGNOSIS — I2699 Other pulmonary embolism without acute cor pulmonale: Secondary | ICD-10-CM | POA: Diagnosis not present

## 2022-04-16 LAB — CBC
HCT: 31.9 % — ABNORMAL LOW (ref 36.0–46.0)
Hemoglobin: 10.5 g/dL — ABNORMAL LOW (ref 12.0–15.0)
MCH: 32.5 pg (ref 26.0–34.0)
MCHC: 32.9 g/dL (ref 30.0–36.0)
MCV: 98.8 fL (ref 80.0–100.0)
Platelets: 142 10*3/uL — ABNORMAL LOW (ref 150–400)
RBC: 3.23 MIL/uL — ABNORMAL LOW (ref 3.87–5.11)
RDW: 15.1 % (ref 11.5–15.5)
WBC: 10.9 10*3/uL — ABNORMAL HIGH (ref 4.0–10.5)
nRBC: 0.2 % (ref 0.0–0.2)

## 2022-04-16 MED ORDER — MEGESTROL ACETATE 400 MG/10ML PO SUSP: 400.0000 mg | Freq: Two times a day (BID) | ORAL | 1 refills | Status: AC

## 2022-04-16 MED ORDER — MIRTAZAPINE 15 MG PO TABS: 15.0000 mg | ORAL_TABLET | Freq: Every day | ORAL | 1 refills | Status: DC

## 2022-04-16 MED ORDER — APIXABAN 5 MG PO TABS: ORAL_TABLET | ORAL | 3 refills | Status: DC

## 2022-04-16 MED ORDER — SERTRALINE HCL 25 MG PO TABS: 25.0000 mg | ORAL_TABLET | Freq: Every day | ORAL | 1 refills | Status: DC

## 2022-04-16 MED ORDER — PANTOPRAZOLE SODIUM 40 MG PO TBEC: 40.0000 mg | DELAYED_RELEASE_TABLET | Freq: Every day | ORAL | 1 refills | Status: DC

## 2022-04-16 MED ORDER — FUROSEMIDE 20 MG PO TABS: 20.0000 mg | ORAL_TABLET | Freq: Every day | ORAL | Status: DC | PRN

## 2022-04-16 MED ORDER — AMITRIPTYLINE HCL 25 MG PO TABS: 25.0000 mg | ORAL_TABLET | Freq: Two times a day (BID) | ORAL | 1 refills | Status: DC

## 2022-04-16 MED ORDER — CEFDINIR 300 MG PO CAPS: 300.0000 mg | ORAL_CAPSULE | Freq: Two times a day (BID) | ORAL | 0 refills | Status: AC

## 2022-04-16 NOTE — Discharge Summary (Signed)
Physician Discharge Summary   Patient: Natalie West MRN: [id number] DOB: [birth year]  Admit date:     04/12/2022  Discharge date: 04/16/22  Discharge Physician: Barton Dubois   PCP: Christain Sacramento, MD   Recommendations at discharge:  Repeat CBC to follow hemoglobin trend and instability Repeat basic metabolic panel to follow electrolytes and renal function Continue further discussion with patient about advance care planning/goals of care. (She has opted to pursued DO NOT RESUSCITATE CODE STATUS at time of discharge).  Discharge Diagnoses: Principal Problem:   Acute pulmonary embolism ([facility]) Active Problems:   Hyperlipidemia   Breast cancer of lower-outer quadrant of right female breast ([place])   Acute diastolic heart failure ([facility])    Pressure injury of skin   Hospital Course: As per H&P written by Dr. Hal Hope on 04/12/2022 Natalie West is a 88 y.o. female with history of atrial fibrillation not on anticoagulation with risk of falls, pulmonary hypertension, hyperlipidemia who was recently admitted to the hospital in March for C-spine and right knee fracture was discharged to rehab and recently discharged from rehab was found to be hypoxic after physical therapy came to visit the patient.  Patient also appeared to be short of breath.   ED Course: In the ER patient was hypoxic requiring 2 L oxygen chest x-ray initially showed multifocal pneumonia was started on antibiotics.  Subsequent CT angiogram of the chest done shows pulmonary embolism with features concerning for pulm hypertension.  Was started on heparin and admitted for further observation.  EKG shows A-fib rate controlled.  Assessment and Plan: 1-Acute pulmonary emboli: Right lower lobe -CT angio positive for acute PE right lower lobe -Bilateral lower extremity doppler positive for DVT -After adequate treatment with IV heparin transition successfully to oral Eliquis. -Anticipated patient to be treated with Eliquis for at least 3 -6  months, further treatment plan and extension of the plan per PCP and pulmonology as an outpatient -ECHO; RV function normal.    BL DVT: acute DVT involving the right common femoral vein and SF junction, acute DVT left common femoral vein, SNF, left femoral vein, left proximal profunda vein and left popliteal vein: -Status post treatment with heparin drip for past 48 hours, transitioning to NOAC -Patient denies chest pain, nausea, vomiting or any other complaints. -No overt bleeding; continue oral anticoagulation treatment.   History of A-fib: -Continue with Atenolol.  -Given history of new onset pulmonary embolism started on Eliquis.  Hyperipidemia: -Continue treatment with statins.   Recent admission for C-spine fracture and right patella fracture:  -Managed conservatively.  -Resume home health health physical therapy. -Continue as needed analgesics.   Constipation;  -Continue to maintain adequate hydration -Continue as needed over-the-counter Lasix.   Klebsiella pneumoniae UTI:  -Continue treatment with cefdinir; plan is for 5 days total -Patient denies dysuria, no fever, normal WBCs.   History of prior breast cancer: In remission -Outpatient follow-up with oncology service/PCP.  Hypokalemia -Replace and within normal limits at discharge -Repeat basic metabolic panel at follow-up visit.   Failure to thrive: -She has been declining over the last 3 months. -Appreciate palliative care consultation  -Per palliative notes, continue with full scope treatments, CODE STATUS changed to DNR.  Palliative care will continue to follow-up. Will need to determine if patient needs 24 hours care.  Continue poor p.o. intake, cachexia -Continue the use of multivitamins, feeding supplements (Ensure), the use of Remeron and will add Megace.   Sacral pressure injury -A stage II without signs of  superimposed infection -Present at time of admission -Continue to keep area clean, dried and  provide constant repositioning.  Preventive measures recommended.  Consultants: Palliative care Procedures performed: See below for x-ray reports. Disposition: Home health Diet recommendation: Cardiac diet  DISCHARGE MEDICATION: Allergies as of 04/16/2022       Reactions   Ciprofloxacin Other (See Comments)   Reaction unknown   Clindamycin/lincomycin Other (See Comments)   Reaction unknown   Codeine Nausea And Vomiting   Diclofenac Other (See Comments)   Reaction unknown   Fenoprofen Calcium Other (See Comments)   Reaction unknown   Flexeril [cyclobenzaprine Hcl] Other (See Comments)   Reaction unknown   Hydrocodone Other (See Comments)   "Last time it was too strong and messed up my mind"   Macrobid [nitrofurantoin] Other (See Comments)   Reaction unknown   Noroxin [norfloxacin] Other (See Comments)   Reaction unknown   Relafen [nabumetone] Other (See Comments)   Reaction unknown   Sulfa Antibiotics Other (See Comments)   Reaction unknown        Medication List     STOP taking these medications    aspirin EC 81 MG tablet   CALCIUM 600 + D PO   hydrocortisone 2.5 % ointment   lidocaine 5 % Commonly known as: LIDODERM   linezolid 600 MG tablet Commonly known as: ZYVOX   perphenazine 2 MG tablet Commonly known as: TRILAFON   potassium chloride SA 20 MEQ tablet Commonly known as: Klor-Con M20       TAKE these medications    acetaminophen 325 MG tablet Commonly known as: TYLENOL Take 2 tablets (650 mg total) by mouth every 6 (six) hours as needed. What changed: reasons to take this   amitriptyline 25 MG tablet Commonly known as: ELAVIL Take 1 tablet (25 mg total) by mouth in the morning and at bedtime. What changed: Another medication with the same name was removed. Continue taking this medication, and follow the directions you see here.   apixaban 5 MG Tabs tablet Commonly known as: ELIQUIS Take 2 tabs by mouth twice a day for 6 days; then start  taking 1 tablet by mouth twice a day.   atenolol 25 MG tablet Commonly known as: TENORMIN Take 0.5 tablets (12.5 mg total) by mouth daily.   cefdinir 300 MG capsule Commonly known as: OMNICEF Take 1 capsule (300 mg total) by mouth 2 (two) times daily for 5 days.   Ensure Take 237 mLs by mouth daily.   furosemide 20 MG tablet Commonly known as: LASIX Take 1 tablet (20 mg total) by mouth daily as needed for fluid or edema. What changed:  when to take this reasons to take this Another medication with the same name was removed. Continue taking this medication, and follow the directions you see here.   megestrol 400 MG/10ML suspension Commonly known as: MEGACE Take 10 mLs (400 mg total) by mouth 2 (two) times daily.   mirtazapine 15 MG tablet Commonly known as: REMERON Take 1 tablet (15 mg total) by mouth at bedtime.   multivitamin with minerals Tabs tablet Take 1 tablet by mouth daily. Centrum Silver for Women   ondansetron 4 MG tablet Commonly known as: ZOFRAN Take 4 mg by mouth every 6 (six) hours as needed for nausea or vomiting.   pantoprazole 40 MG tablet Commonly known as: PROTONIX Take 1 tablet (40 mg total) by mouth daily. 1 Tablet Daily for Heartburn and Acid Reflux   polyethylene glycol 17 g packet  Commonly known as: MIRALAX / GLYCOLAX Take 17 g by mouth 2 (two) times daily as needed for mild constipation.   pravastatin 20 MG tablet Commonly known as: PRAVACHOL Take 20 mg by mouth at bedtime.   senna-docusate 8.6-50 MG tablet Commonly known as: Senokot-S Take 1 tablet by mouth 2 (two) times daily as needed for mild constipation.   sertraline 25 MG tablet Commonly known as: ZOLOFT Take 1 tablet (25 mg total) by mouth daily.   traMADol 50 MG tablet Commonly known as: ULTRAM Take 1 tablet (50 mg total) by mouth every 6 (six) hours as needed for severe pain.               Discharge Care Instructions  (From admission, onward)           Start      Ordered   04/16/22 0000  Discharge wound care:       Comments: Keep area clean and dry; constant repositioning.  No signs of superimposed infection appreciated during hospitalization examination.   04/16/22 1206            Follow-up Information     Christain Sacramento, MD. Schedule an appointment as soon as possible for a visit in 10 day(s).   Specialty: Family Medicine Contact information: [address] [place] [address] [phone]         Troy Sine, MD .   Specialty: Cardiology Contact information: [address] [place] [address] [phone]                Discharge Exam: Danley Danker Weights   04/14/22 0416 04/15/22 0510 04/16/22 0531  Weight: 56.5 kg 52.9 kg 60 kg   General exam: Alert, awake, able to follow commands appropriately and demonstrating acute distress.  Patient denies nausea, vomiting and chest pain.  Chronically ill and frail in appearance. Respiratory system: Good air movement bilaterally; no wheezes, no crackles, no using accessory muscles. Cardiovascular system: Rate controlled, no rubs, no gallops, no JVD. Gastrointestinal system: Abdomen is nondistended, soft and nontender. No organomegaly or masses felt. Normal bowel sounds heard. Central nervous system: No focal neurological deficits. Extremities: No cyanosis or clubbing. Skin: No rashes, or petechiae. Psychiatry: Mood & affect appropriate.    Condition at discharge: Stable and improved.  The results of significant diagnostics from this hospitalization (including imaging, microbiology, ancillary and laboratory) are listed below for reference.   Imaging Studies: VAS [place] LOWER EXTREMITY VENOUS (DVT)  Result Date: 04/13/2022  Lower Venous DVT Study Patient Name:  OLUWADARASIMI FAVOR  Date of Exam:   04/13/2022 Medical Rec #: [id number]   Accession #:    [id number] Date of Birth: [birth year]   Patient Gender: F Patient Age:   35 years Exam Location:  [facility]  Procedure:      VAS [place] LOWER EXTREMITY VENOUS (DVT) Referring Phys: Gean Birchwood --------------------------------------------------------------------------------  Indications: Pulmonary embolism.  Anticoagulation: Heparin. Limitations: Compressions not performed distal to acute findings. Comparison Study: 01-01-2022 Prior right lower extremity venous was positive for                   varicose SVT involving the right thigh veins. Performing Technologist: Darlin Coco RDMS, RVT  Examination Guidelines: A complete evaluation includes B-mode imaging, spectral Doppler, color Doppler, and power Doppler as needed of all accessible portions of each vessel. Bilateral testing is considered an integral part of a complete examination. Limited examinations for reoccurring indications may be performed  as noted. The reflux portion of the exam is performed with the patient in reverse Trendelenburg.  +---------+---------------+---------+-----------+----------+--------------+ RIGHT    CompressibilityPhasicitySpontaneityPropertiesThrombus Aging +---------+---------------+---------+-----------+----------+--------------+ CFV      Partial        Yes      Yes                  Acute          +---------+---------------+---------+-----------+----------+--------------+ SFJ      Partial        Yes      Yes                  Acute          +---------+---------------+---------+-----------+----------+--------------+ FV Prox                 Yes      Yes                                 +---------+---------------+---------+-----------+----------+--------------+ FV Mid                  Yes      Yes                                 +---------+---------------+---------+-----------+----------+--------------+ FV Distal               Yes      Yes                                 +---------+---------------+---------+-----------+----------+--------------+ PFV                     Yes      Yes                                  +---------+---------------+---------+-----------+----------+--------------+ POP                     Yes      Yes                                 +---------+---------------+---------+-----------+----------+--------------+ PTV                     Yes      Yes                                 +---------+---------------+---------+-----------+----------+--------------+ PERO                    Yes      Yes                                 +---------+---------------+---------+-----------+----------+--------------+   +----------+---------------+---------+-----------+----------+--------------+ LEFT      CompressibilityPhasicitySpontaneityPropertiesThrombus Aging +----------+---------------+---------+-----------+----------+--------------+ CFV       Partial        Yes      Yes                  Acute          +----------+---------------+---------+-----------+----------+--------------+  SFJ       Partial        Yes      Yes                  Acute          +----------+---------------+---------+-----------+----------+--------------+ FV Prox   Partial        Yes      Yes                  Acute          +----------+---------------+---------+-----------+----------+--------------+ FV Mid                   No       No                   Acute          +----------+---------------+---------+-----------+----------+--------------+ FV Distal                No       No                   Acute          +----------+---------------+---------+-----------+----------+--------------+ PFV       None           No       No                   Acute          +----------+---------------+---------+-----------+----------+--------------+ POP                      No       No                   Acute          +----------+---------------+---------+-----------+----------+--------------+ PTV                      Yes      Yes                                  +----------+---------------+---------+-----------+----------+--------------+ PERO                     Yes      Yes                                 +----------+---------------+---------+-----------+----------+--------------+ EIV Distal               Yes      Yes                                 +----------+---------------+---------+-----------+----------+--------------+     Summary: RIGHT: - Findings consistent with acute deep vein thrombosis involving the right common femoral vein, and SF junction. - No cystic structure found in the popliteal fossa. - Common femoral vein obstruction doesn't appear to extend above inguinal ligament.  LEFT: - Findings consistent with acute deep vein thrombosis involving the left common femoral vein, SF junction, left femoral vein, left proximal profunda vein, and left popliteal vein. - A cystic structure is found in the popliteal fossa. - The common femoral vein obstruction does not appear to extend above inguinal ligament.  *  See table(s) above for measurements and observations. Electronically signed by Servando Snare MD on 04/13/2022 at 6:03:48 PM.    Final    ECHOCARDIOGRAM COMPLETE  Result Date: 04/13/2022    ECHOCARDIOGRAM REPORT   Patient Name:   YEXALEN DEIKE Date of Exam: 04/13/2022 Medical Rec #:  [id number]  Height:       62.0 in Accession #:    [id number] Weight:       130.0 lb Date of Birth:  1931/09/26  BSA:          1.592 m Patient Age:    56 years   BP:           129/73 mmHg Patient Gender: F          HR:           90 bpm. Exam Location:  Inpatient Procedure: 2D Echo, Color Doppler and Cardiac Doppler Indications:    Pulmonary embolism  History:        Patient has prior history of Echocardiogram examinations, most                 recent 06/02/2021. Pulmonary HTN, Arrythmias:Atrial Fibrillation;                 Signs/Symptoms:Shortness of Breath.  Sonographer:    Joette Catching RCS Referring Phys: [place]  Sonographer Comments: Technically  challenging study due to limited acoustic windows, suboptimal apical window and suboptimal subcostal window. Image acquisition challenging due to uncooperative patient. IMPRESSIONS  1. Left ventricular ejection fraction, by estimation, is 55 to 60%. The left ventricle has normal function. The left ventricle has no regional wall motion abnormalities. There is mild asymmetric left ventricular hypertrophy. Left ventricular diastolic parameters are indeterminate.  2. Right ventricular systolic function is normal. The right ventricular size is mildly enlarged. There is normal pulmonary artery systolic pressure.  3. Left atrial size was mildly dilated.  4. The mitral valve is grossly normal. Mild mitral valve regurgitation.  5. The aortic valve is tricuspid. Aortic valve regurgitation is mild to moderate.  6. Aortic no significnt ascending aortic aneurysm.  7. The inferior vena cava is normal in size with greater than 50% respiratory variability, suggesting right atrial pressure of 3 mmHg. Comparison(s): No significant change from prior study. FINDINGS  Left Ventricle: Left ventricular ejection fraction, by estimation, is 55 to 60%. The left ventricle has normal function. The left ventricle has no regional wall motion abnormalities. The left ventricular internal cavity size was normal in size. There is  mild asymmetric left ventricular hypertrophy. Left ventricular diastolic parameters are indeterminate. Right Ventricle: The right ventricular size is mildly enlarged. Right ventricular systolic function is normal. There is normal pulmonary artery systolic pressure. The tricuspid regurgitant velocity is 2.40 m/s, and with an assumed right atrial pressure of 3 mmHg, the estimated right ventricular systolic pressure is 56.3 mmHg. Left Atrium: Left atrial size was mildly dilated. Right Atrium: Right atrial size was normal in size. Pericardium: There is no evidence of pericardial effusion. Mitral Valve: The mitral valve is  grossly normal. Mild mitral valve regurgitation. Tricuspid Valve: The tricuspid valve is normal in structure. Tricuspid valve regurgitation is mild. Aortic Valve: The aortic valve is tricuspid. Aortic valve regurgitation is mild to moderate. Aortic regurgitation PHT measures 528 msec. Aortic valve mean gradient measures 3.0 mmHg. Aortic valve peak gradient measures 4.8 mmHg. Aortic valve area, by VTI  measures 1.98 cm. Pulmonic Valve: The pulmonic valve was not well visualized.  Pulmonic valve regurgitation is trivial. Aorta: No significnt ascending aortic aneurysm. Venous: The inferior vena cava is normal in size with greater than 50% respiratory variability, suggesting right atrial pressure of 3 mmHg.  LEFT VENTRICLE PLAX 2D LVIDd:         4.20 cm   Diastology LVIDs:         3.05 cm   LV e' medial:    7.29 cm/s LV PW:         0.95 cm   LV E/e' medial:  10.5 LV IVS:        0.80 cm   LV e' lateral:   11.30 cm/s LVOT diam:     2.10 cm   LV E/e' lateral: 6.8 LV SV:         45 LV SV Index:   28 LVOT Area:     3.46 cm  RIGHT VENTRICLE          IVC RV Basal diam:  4.50 cm  IVC diam: 1.70 cm RV Mid diam:    3.50 cm LEFT ATRIUM             Index        RIGHT ATRIUM           Index LA diam:        4.10 cm 2.58 cm/m   RA Area:     16.00 cm LA Vol (A2C):   53.8 ml 33.80 ml/m  RA Volume:   43.10 ml  27.08 ml/m LA Vol (A4C):   37.7 ml 23.68 ml/m LA Biplane Vol: 45.6 ml 28.65 ml/m  AORTIC VALVE                    PULMONIC VALVE AV Area (Vmax):    2.24 cm     PV Vmax:       0.81 m/s AV Area (Vmean):   2.09 cm     PV Peak grad:  2.6 mmHg AV Area (VTI):     1.98 cm AV Vmax:           110.00 cm/s AV Vmean:          86.800 cm/s AV VTI:            0.227 m AV Peak Grad:      4.8 mmHg AV Mean Grad:      3.0 mmHg LVOT Vmax:         71.10 cm/s LVOT Vmean:        52.400 cm/s LVOT VTI:          0.130 m LVOT/AV VTI ratio: 0.57 AI PHT:            528 msec AR Vena Contracta: 0.40 cm  AORTA Ao Root diam: 3.90 cm Ao Asc diam:  3.70 cm  MITRAL VALVE               TRICUSPID VALVE MV Area (PHT): 4.49 cm    TR Peak grad:   23.0 mmHg MV Decel Time: 169 msec    TR Vmax:        240.00 cm/s MV E velocity: 76.70 cm/s                            SHUNTS                            Systemic VTI:  0.13  m                            Systemic Diam: 2.10 cm Phineas Inches Electronically signed by Phineas Inches Signature Date/Time: 04/13/2022/11:44:33 AM    Final    CT Angio Chest Pulmonary Embolism (PE) W or WO Contrast  Result Date: 04/12/2022 CLINICAL DATA:  Pulmonary embolism (PE) suspected, high prob. Cough. Difficulty breathing. EXAM: CT ANGIOGRAPHY CHEST WITH CONTRAST TECHNIQUE: Multidetector CT imaging of the chest was performed using the standard protocol during bolus administration of intravenous contrast. Multiplanar CT image reconstructions and MIPs were obtained to evaluate the vascular anatomy. RADIATION DOSE REDUCTION: This exam was performed according to the departmental dose-optimization program which includes automated exposure control, adjustment of the mA and/or kV according to patient size and/or use of iterative reconstruction technique. CONTRAST:  36m OMNIPAQUE IOHEXOL 350 MG/ML SOLN COMPARISON:  01/21/2022 FINDINGS: Cardiovascular: Filling defects in the right lower lobe pulmonary arteries compatible with pulmonary emboli. No evidence of right heart strain. Cardiomegaly. Prominent central pulmonary arteries suggest pulmonary arterial hypertension. Coronary artery and aortic calcifications. No aneurysm. Mediastinum/Nodes: No mediastinal, hilar, or axillary adenopathy. Trachea and esophagus are unremarkable. Thyroid unremarkable. Lungs/Pleura: Centrilobular emphysema. Ground-glass opacities within the upper lung zones. No confluent opacities or effusions. Upper Abdomen: No acute findings Musculoskeletal: Chest wall soft tissues are unremarkable. No acute bony abnormality. Review of the MIP images confirms the above findings. IMPRESSION: Positive  for acute pulmonary emboli in the right lower lobe. Cardiomegaly, coronary artery disease. Prominent central pulmonary arteries suggest pulmonary arterial hypertension. Aortic Atherosclerosis (ICD10-I70.0) and Emphysema (ICD10-J43.9). Electronically Signed   By: KRolm BaptiseM.D.   On: 04/12/2022 22:20   DG Chest Port 1 View  Result Date: 04/12/2022 CLINICAL DATA:  Family states patient has completed x2 courses of abx for a uti but it returns when course is complete. Pt has been confused and having hallucinations for the past 10 days. EXAM: PORTABLE CHEST 1 VIEW COMPARISON:  CT, 01/21/2022.  Chest radiograph, 12/06/2021. FINDINGS: Cardiac silhouette borderline enlarged. No mediastinal or hilar masses. Low lung volumes. Coarse bilateral interstitial thickening. Intervening hazy airspace opacities no left mid and upper lung and right upper lung, new compared to the prior radiographs. No convincing pleural effusion.  No pneumothorax. Skeletal structures are grossly intact. IMPRESSION: 1. Hazy bilateral airspace lung opacities superimposed on chronic interstitial thickening. Findings suspicious for multifocal pneumonia. Electronically Signed   By: DLajean ManesM.D.   On: 04/12/2022 17:22    Microbiology: Results for orders placed or performed during the hospital encounter of 04/12/22  Blood Culture (routine x 2)     Status: None (Preliminary result)   Collection Time: 04/12/22  4:41 PM   Specimen: BLOOD LEFT FOREARM  Result Value Ref Range Status   Specimen Description BLOOD LEFT FOREARM  Final   Special Requests   Final    BOTTLES DRAWN AEROBIC AND ANAEROBIC Blood Culture results may not be optimal due to an excessive volume of blood received in culture bottles   Culture   Final    NO GROWTH 4 DAYS Performed at [facility] [address] [place] [address]   Report Status PENDING  Incomplete  Urine Culture     Status: Abnormal   Collection Time: 04/12/22  4:41 PM   Specimen:  Urine, Clean Catch  Result Value Ref Range Status   Specimen Description URINE, CLEAN CATCH  Final   Special  Requests   Final    NONE Performed at [facility], [place] [address], [place], [place] [address]    Culture >=100,000 COLONIES/mL KLEBSIELLA PNEUMONIAE (A)  Final   Report Status 04/15/2022 FINAL  Final   Organism ID, Bacteria KLEBSIELLA PNEUMONIAE (A)  Final      Susceptibility   Klebsiella pneumoniae - MIC*    AMPICILLIN >=32 RESISTANT Resistant     CEFAZOLIN <=4 SENSITIVE Sensitive     CEFEPIME <=0.12 SENSITIVE Sensitive     CEFTRIAXONE <=0.25 SENSITIVE Sensitive     CIPROFLOXACIN <=0.25 SENSITIVE Sensitive     GENTAMICIN <=1 SENSITIVE Sensitive     IMIPENEM <=0.25 SENSITIVE Sensitive     NITROFURANTOIN 32 SENSITIVE Sensitive     TRIMETH/SULFA <=20 SENSITIVE Sensitive     AMPICILLIN/SULBACTAM 8 SENSITIVE Sensitive     PIP/TAZO 8 SENSITIVE Sensitive     * >=100,000 COLONIES/mL KLEBSIELLA PNEUMONIAE  Blood Culture (routine x 2)     Status: None (Preliminary result)   Collection Time: 04/12/22  4:46 PM   Specimen: BLOOD LEFT FOREARM  Result Value Ref Range Status   Specimen Description BLOOD LEFT FOREARM  Final   Special Requests   Final    BOTTLES DRAWN AEROBIC AND ANAEROBIC Blood Culture results may not be optimal due to an excessive volume of blood received in culture bottles   Culture   Final    NO GROWTH 4 DAYS Performed at [facility] [address], [place], [place] [address]    Report Status PENDING  Incomplete  Resp Panel by RT-PCR (Flu A&B, Covid)     Status: None   Collection Time: 04/12/22  5:31 PM   Specimen: Nasal Swab  Result Value Ref Range Status   SARS Coronavirus 2 by RT PCR NEGATIVE NEGATIVE Final    Comment: (NOTE) SARS-CoV-2 target nucleic acids are NOT DETECTED.  The SARS-CoV-2 RNA is generally detectable in upper respiratory specimens during the acute phase of infection. The lowest concentration of SARS-CoV-2 viral copies this  assay can detect is 138 copies/mL. A negative result does not preclude SARS-Cov-2 infection and should not be used as the sole basis for treatment or other patient management decisions. A negative result may occur with  improper specimen collection/handling, submission of specimen other than nasopharyngeal swab, presence of viral mutation(s) within the areas targeted by this assay, and inadequate number of viral copies(<138 copies/mL). A negative result must be combined with clinical observations, patient history, and epidemiological information. The expected result is Negative.  Fact Sheet for Patients:  EntrepreneurPulse.com.au  Fact Sheet for Healthcare Providers:  IncredibleEmployment.be  This test is no t yet approved or cleared by the [place] FDA and  has been authorized for detection and/or diagnosis of SARS-CoV-2 by FDA under an Emergency Use Authorization (EUA). This EUA will remain  in effect (meaning this test can be used) for the duration of the COVID-19 declaration under Section 564(b)(1) of the Act, 21 U.S.C.section 360bbb-3(b)(1), unless the authorization is terminated  or revoked sooner.       Influenza A by PCR NEGATIVE NEGATIVE Final   Influenza B by PCR NEGATIVE NEGATIVE Final    Comment: (NOTE) The Xpert Xpress SARS-CoV-2/FLU/RSV plus assay is intended as an aid in the diagnosis of influenza from Nasopharyngeal swab specimens and should not be used as a sole basis for treatment. Nasal washings and aspirates are unacceptable for Xpert Xpress SARS-CoV-2/FLU/RSV testing.  Fact Sheet for Patients: EntrepreneurPulse.com.au  Fact Sheet for  Healthcare Providers: IncredibleEmployment.be  This test is not yet approved or cleared by the [place] and has been authorized for detection and/or diagnosis of SARS-CoV-2 by FDA under an Emergency Use Authorization (EUA). This EUA will  remain in effect (meaning this test can be used) for the duration of the COVID-19 declaration under Section 564(b)(1) of the Act, 21 U.S.C. section 360bbb-3(b)(1), unless the authorization is terminated or revoked.  Performed at [facility], [place] [address], [place], [place] [address]   MRSA Next Gen by PCR, Nasal     Status: None   Collection Time: 04/12/22 11:11 PM   Specimen: Nasal Swab  Result Value Ref Range Status   MRSA by PCR Next Gen NOT DETECTED NOT DETECTED Final    Comment: (NOTE) The GeneXpert MRSA Assay (FDA approved for NASAL specimens only), is one component of a comprehensive MRSA colonization surveillance program. It is not intended to diagnose MRSA infection nor to guide or monitor treatment for MRSA infections. Test performance is not FDA approved in patients less than 46 years old. Performed at [facility], [place] [address] [place], [place] [address]     Labs: CBC: Recent Labs  Lab 04/12/22 1641 04/13/22 0606 04/14/22 0053 04/15/22 0240 04/16/22 0104  WBC 12.6* 10.7* 10.3 10.6* 10.9*  NEUTROABS 10.0*  --   --   --   --   HGB 11.8* 10.9* 10.5* 11.0* 10.5*  HCT 38.1 34.5* 33.9* 33.4* 31.9*  MCV 102.7* 103.9* 103.7* 102.1* 98.8  PLT 197 167 169 164 485*   Basic Metabolic Panel: Recent Labs  Lab 04/12/22 1641 04/13/22 0606 04/14/22 0053  NA 139 139 140  K 3.7 3.4* 4.0  CL 108 110 110  CO2 24 21* 22  GLUCOSE 110* 104* 88  BUN '20 20 20  '$ CREATININE 0.89 0.80 0.89  CALCIUM 8.3* 8.1* 8.6*   Liver Function Tests: Recent Labs  Lab 04/12/22 1641 04/13/22 0606  AST 18 18  ALT 16 10  ALKPHOS 83 69  BILITOT 0.7 1.1  PROT 6.9 5.8*  ALBUMIN 3.1* 2.7*   CBG: No results for input(s): "GLUCAP" in the last 168 hours.  Discharge time spent: greater than 30 minutes.  Signed: Barton Dubois, MD [facility] 04/16/2022

## 2022-04-16 NOTE — Progress Notes (Signed)
Patient transported home by Rodman Pickle provided with D/C paper work.

## 2022-04-16 NOTE — Care Management Important Message (Signed)
Important Message  Patient Details  Name: Natalie West MRN: [id number] Date of Birth: Nov 26, 1930   Medicare Important Message Given:  Yes     Shelda Altes 04/16/2022, 7:55 AM

## 2022-04-16 NOTE — Plan of Care (Signed)
  Problem: Coping: Goal: Level of anxiety will decrease 04/16/2022 1224 by Delia Chimes, RN Outcome: Adequate for Discharge 04/16/2022 1224 by Delia Chimes, RN Outcome: Adequate for Discharge

## 2022-04-16 NOTE — TOC Transition Note (Signed)
Transition of Care (TOC) - CM/SW Discharge Note Natalie Gibbons RN, BSN Transitions of Care Unit 4E- RN Case Manager See Treatment Team for direct phone #    Patient Details  Name: Natalie West MRN: [id number] Date of Birth: 1931/09/19  Transition of Care Natalie West) CM/SW Contact:  Natalie Patricia, RN Phone Number: 04/16/2022, 12:21 PM   Clinical Narrative:    Pt stable for transition home today, Natalie West orders have been placed.  PC has seen and spoken with pt and family at the bedside to confirm [place].  CM came to bedside while PC was in room- discussed and confirmed plans to return home w/ [facility] services- nephew- Natalie West states that he would like to continue with [facility] whom pt was active with prior to admit for HHRN/PT. Natalie West also states pt has all needed DME at this time- no new DME needed noted.   Natalie West requesting that pt transport home via EMS- address confirmed for transport.  Also discussed outpt PC needs- per Natalie West he does not have a preference as he is not familiar with agencies here- he is from [place] area. Provided info on outpt PC and he is agreeable to referral with [facility]- referral called to hospital liaison who will f/u post discharge for outpt PC needs.   Natalie West also asking about medications, and making sure scripts are sent to [facility] for family to pick up for pt prior to them returning home. Will send msg to MD regarding this matter. Natalie West also has questions about particular drugs- Elavil and Zoloft for MD to address- Msg also sent to MD about this- and MD will address.   Call made to [facility] for resumption of care needs- will resume HHRN/PT on discharge.   [facility] called for transport - 1230- with an ETA for pickup within the hour (3 on list ahead of pt)- bedside RN updated, paperwork and GOLD DNR placed on chart for transport.      Final next level of care: [place] Barriers to Discharge: No Barriers Identified   Patient Goals and CMS Choice Patient states  their goals for this hospitalization and ongoing recovery are:: return home CMS Medicare.gov Compare Post Acute Care list provided to:: Patient Choice offered to / list presented to : Patient, [facility] POA / Guardian  Discharge Placement               Home w/ [facility]        Discharge Plan and Services   Discharge Planning Services: CM Consult Post Acute Care Choice: Home Health          DME Arranged: N/A DME Agency: NA       HH Arranged: RN, PT [place] Agency: [facility] Date [place]: 04/16/22 Time [place]: 1219 Representative spoke with at [place]: [place] ([place]) Interventions     Readmission Risk Interventions    04/16/2022   12:20 PM  Readmission Risk Prevention Plan  Transportation Screening Complete  PCP or Specialist Appt within 5-7 Days Complete  Home Care Screening Complete  Medication Review (RN CM) Complete

## 2022-04-16 NOTE — Progress Notes (Signed)
Hydrologist [facility] [facility])  Hospital Liaison: RN note       Notified by [facility] manager of patient/family request for [facility] Palliative services at home after discharge.            [place] Palliative team will follow up with patient after discharge.       Please call with any hospice or palliative related questions.       Thank you for this referral.       Clementeen Hoof, DNP, RN [place] (listed on AMION under Hospice and [place] of [place]  [phone]

## 2022-04-16 NOTE — Plan of Care (Signed)
  Problem: Education: Goal: Knowledge of General Education information will improve Description: Including pain rating scale, medication(s)/side effects and non-pharmacologic comfort measures Outcome: Adequate for Discharge   Problem: Health Behavior/Discharge Planning: Goal: Ability to manage health-related needs will improve Outcome: Adequate for Discharge   Problem: Clinical Measurements: Goal: Ability to maintain clinical measurements within normal limits will improve Outcome: Adequate for Discharge Goal: Will remain free from infection Outcome: Adequate for Discharge Goal: Diagnostic test results will improve Outcome: Adequate for Discharge Goal: Respiratory complications will improve Outcome: Adequate for Discharge Goal: Cardiovascular complication will be avoided Outcome: Adequate for Discharge   Problem: Skin Integrity: Goal: Risk for impaired skin integrity will decrease Outcome: Adequate for Discharge   Problem: Safety: Goal: Ability to remain free from injury will improve Outcome: Adequate for Discharge   Problem: Pain Managment: Goal: General experience of comfort will improve Outcome: Adequate for Discharge   Problem: Elimination: Goal: Will not experience complications related to bowel motility Outcome: Adequate for Discharge Goal: Will not experience complications related to urinary retention Outcome: Adequate for Discharge

## 2022-04-16 NOTE — Progress Notes (Signed)
Daily Progress Note   Patient Name: Natalie West       Date: 04/16/2022 DOB: 11-Nov-1930  Age: 88 y.o. MRN#: [id number] Attending Physician: Barton Dubois, MD Primary Care Physician: Christain [place], MD Admit Date: 04/12/2022  Reason for Consultation/Follow-up: Establishing goals of care  Subjective: Medical records reviewed including progress notes, labs. Patient assessed at the bedside. She tells me she needs to get cleaned up and her relative Lenna Sciara clarifies she also needs to use the bedpan as well. Discussed with RN. Patient's HCPOA [name] is also present at the bedside.  We discussed the difference between palliative care and hospice, as patient has been consistently stating she is ready for hospice. She states "I was hoping that I wasn't going to live much longer." Her nephew [name] informs her that they will continue conversations about her priorities and overall care plan once she is home, but he is optimistic and wants to "do what we can." Patient clarifies that he wants her to get better and he confirms. She is agreeable to trial of home health and therapies. Family shares with me that this is not what they usually hear from her when talking about her wishes.  Questions and concerns addressed. PMT will continue to support holistically.   Length of Stay: 3   Physical Exam Vitals and nursing note reviewed.  Constitutional:      General: She is not in acute distress.    Appearance: She is ill-appearing.     Interventions: Nasal cannula in place.  Cardiovascular:     Rate and Rhythm: Normal rate.  Pulmonary:     Effort: Pulmonary effort is normal.  Skin:    General: Skin is cool and dry.  Neurological:     Mental Status: She is alert.  Psychiatric:        Mood and Affect: Mood  normal.        Behavior: Behavior is cooperative.             Vital Signs: BP 117/71 (BP Location: Right Arm)   Pulse 93   Temp 98.4 F (36.9 C) (Oral)   Resp 16   Ht '5\' 2"'$  (1.575 m)   Wt 60 kg   LMP 11/03/1983   SpO2 97%   BMI 24.19 kg/m  SpO2: SpO2: 97 % O2 Device:  O2 Device: Room Air O2 Flow Rate: O2 Flow Rate (L/min): 2 L/min      Palliative Assessment/Data: 30%      Palliative Care Assessment & Plan   Patient Profile: 88 y.o. female  with past medical history of atrial fibrillation not on anticoagulation with risk of falls, pulmonary hypertension, hyperlipidemia, breast cancer, admitted on 04/12/2022 with hypoxia and dyspnea noted during PT visit at home.      Patient was recently admitted in March after a fall with a C-spine and right knee fracture, was discharged to rehab and returned home recently.  Has had ongoing decline since then.  PMT has been consulted to assist with goals of care conversation.  Assessment: Acute PE Acute DVTs Acute respiratory failure with hypoxia Debility Failure to thrive Goals of care conversation  Recommendations/Plan: DNR, gold form signed and placed on hard chart. Will scan copy into EMR Continue current care Outpatient palliative care referral at discharge  Psychosocial and emotional support provided PMT will continue to follow   Prognosis: Poor prognosis with failure to thrive and multiple acute and chronic illnesses  Discharge Planning: Home with [place] was discussed with patient, patient's Ninfa [place], relative Melissa, RN Gaspar Bidding, NCM Kristi   Total time: I spent 40 minutes in the care of the patient today in the above activities and documenting the encounter.   Dorthy Cooler, PA-C Palliative Medicine Team Team phone # [phone]  Thank you for allowing the Palliative Medicine Team to assist in the care of this patient. Please utilize secure chat with additional  questions, if there is no response within 30 minutes please call the above phone number.  Palliative Medicine Team providers are available by phone from 7am to 7pm daily and can be reached through the team cell phone.  Should this patient require assistance outside of these hours, please call the patient's attending physician.

## 2022-04-17 LAB — CULTURE, BLOOD (ROUTINE X 2)
Culture: NO GROWTH
Culture: NO GROWTH

## 2022-04-21 ENCOUNTER — Telehealth: Payer: Self-pay

## 2022-04-21 NOTE — Telephone Encounter (Signed)
Spoke with patient's nephew [name] and scheduled a in person (per request) Palliative Consult for 05/07/22 @ 11:30 AM.  Consent obtained; updated Netsmart, Team List and Epic.

## 2022-04-24 ENCOUNTER — Emergency Department (HOSPITAL_COMMUNITY): Payer: Medicare HMO

## 2022-04-24 ENCOUNTER — Encounter (HOSPITAL_COMMUNITY): Payer: Self-pay

## 2022-04-24 ENCOUNTER — Emergency Department (HOSPITAL_COMMUNITY)
Admission: EM | Admit: 2022-04-24 | Discharge: 2022-04-24 | Disposition: A | Discharge status: Home / Self Care | Payer: Medicare HMO | Attending: Emergency Medicine | Admitting: Emergency Medicine

## 2022-04-24 ENCOUNTER — Other Ambulatory Visit: Payer: Self-pay

## 2022-04-24 DIAGNOSIS — Z20822 Contact with and (suspected) exposure to covid-19: Secondary | ICD-10-CM | POA: Diagnosis not present

## 2022-04-24 DIAGNOSIS — Z79899 Other long term (current) drug therapy: Secondary | ICD-10-CM | POA: Insufficient documentation

## 2022-04-24 DIAGNOSIS — I10 Essential (primary) hypertension: Secondary | ICD-10-CM | POA: Insufficient documentation

## 2022-04-24 DIAGNOSIS — Z7901 Long term (current) use of anticoagulants: Secondary | ICD-10-CM | POA: Diagnosis not present

## 2022-04-24 DIAGNOSIS — F0390 Unspecified dementia without behavioral disturbance: Secondary | ICD-10-CM | POA: Diagnosis not present

## 2022-04-24 DIAGNOSIS — R0602 Shortness of breath: Secondary | ICD-10-CM

## 2022-04-24 DIAGNOSIS — R059 Cough, unspecified: Secondary | ICD-10-CM | POA: Diagnosis not present

## 2022-04-24 LAB — CBC WITH DIFFERENTIAL/PLATELET
Abs Immature Granulocytes: 0.07 10*3/uL (ref 0.00–0.07)
Basophils Absolute: 0.1 10*3/uL (ref 0.0–0.1)
Basophils Relative: 1 %
Eosinophils Absolute: 0.7 10*3/uL — ABNORMAL HIGH (ref 0.0–0.5)
Eosinophils Relative: 8 %
HCT: 34.8 % — ABNORMAL LOW (ref 36.0–46.0)
Hemoglobin: 10.7 g/dL — ABNORMAL LOW (ref 12.0–15.0)
Immature Granulocytes: 1 %
Lymphocytes Relative: 12 %
Lymphs Abs: 1.1 10*3/uL (ref 0.7–4.0)
MCH: 33 pg (ref 26.0–34.0)
MCHC: 30.7 g/dL (ref 30.0–36.0)
MCV: 107.4 fL — ABNORMAL HIGH (ref 80.0–100.0)
Monocytes Absolute: 0.5 10*3/uL (ref 0.1–1.0)
Monocytes Relative: 6 %
Neutro Abs: 6.9 10*3/uL (ref 1.7–7.7)
Neutrophils Relative %: 72 %
Platelets: 216 10*3/uL (ref 150–400)
RBC: 3.24 MIL/uL — ABNORMAL LOW (ref 3.87–5.11)
RDW: 15.8 % — ABNORMAL HIGH (ref 11.5–15.5)
WBC: 9.4 10*3/uL (ref 4.0–10.5)
nRBC: 0 % (ref 0.0–0.2)

## 2022-04-24 LAB — COMPREHENSIVE METABOLIC PANEL
ALT: 9 U/L (ref 0–44)
AST: 22 U/L (ref 15–41)
Albumin: 2.6 g/dL — ABNORMAL LOW (ref 3.5–5.0)
Alkaline Phosphatase: 76 U/L (ref 38–126)
Anion gap: 9 (ref 5–15)
BUN: 10 mg/dL (ref 8–23)
CO2: 18 mmol/L — ABNORMAL LOW (ref 22–32)
Calcium: 7.8 mg/dL — ABNORMAL LOW (ref 8.9–10.3)
Chloride: 113 mmol/L — ABNORMAL HIGH (ref 98–111)
Creatinine, Ser: 0.77 mg/dL (ref 0.44–1.00)
GFR, Estimated: >60 mL/min (ref >60)
Glucose, Bld: 110 mg/dL — ABNORMAL HIGH (ref 70–99)
Potassium: 3.9 mmol/L (ref 3.5–5.1)
Sodium: 140 mmol/L (ref 135–145)
Total Bilirubin: 0.7 mg/dL (ref 0.3–1.2)
Total Protein: 5.3 g/dL — ABNORMAL LOW (ref 6.5–8.1)

## 2022-04-24 LAB — RESP PANEL BY RT-PCR (FLU A&B, COVID) ARPGX2
Influenza A by PCR: NEGATIVE
Influenza B by PCR: NEGATIVE
SARS Coronavirus 2 by RT PCR: NEGATIVE

## 2022-04-24 LAB — TROPONIN I (HIGH SENSITIVITY): Troponin I (High Sensitivity): 10 ng/L (ref <18)

## 2022-04-24 LAB — APTT: aPTT: 38 seconds — ABNORMAL HIGH (ref 24–36)

## 2022-04-24 LAB — PROTIME-INR
INR: 1.9 — ABNORMAL HIGH (ref 0.8–1.2)
Prothrombin Time: 21.5 seconds — ABNORMAL HIGH (ref 11.4–15.2)

## 2022-04-24 LAB — MAGNESIUM: Magnesium: 1.9 mg/dL (ref 1.7–2.4)

## 2022-04-24 MED ORDER — ATENOLOL 25 MG PO TABS: 12.5000 mg | ORAL_TABLET | Freq: Once | ORAL | Status: DC

## 2022-04-24 MED ORDER — SODIUM CHLORIDE 0.9 % IV BOLUS
250.0000 mL | Freq: Once | INTRAVENOUS | Status: AC
  Administered 2022-04-24: 250 mL via INTRAVENOUS

## 2022-04-24 MED ORDER — BENZONATATE 100 MG PO CAPS: 100.0000 mg | ORAL_CAPSULE | Freq: Three times a day (TID) | ORAL | 0 refills | Status: DC

## 2022-04-24 MED ORDER — ATENOLOL 25 MG PO TABS
12.5000 mg | ORAL_TABLET | Freq: Once | ORAL | Status: AC
  Administered 2022-04-24: 12.5 mg via ORAL
  Filled 2022-04-24: qty 1

## 2022-04-24 MED ORDER — BENZONATATE 100 MG PO CAPS
100.0000 mg | ORAL_CAPSULE | Freq: Once | ORAL | Status: AC
  Administered 2022-04-24: 100 mg via ORAL
  Filled 2022-04-24: qty 1

## 2022-04-24 NOTE — ED Notes (Signed)
Blood samples sent to lab at this time.

## 2022-04-24 NOTE — ED Triage Notes (Addendum)
Sob and dyspnea from home today. RA sats 81%. Recent dx of pneumonia. Full Code. EMS placed on NRB and she is 100%. EMS gave 125 solumedrol. Afib initially 130-150s but not 110s/120s

## 2022-04-24 NOTE — ED Notes (Signed)
This RN called the nephew who states that they are aware that the patient will be discharged home. Nephew reports that he is okay with the Use of [facility] to send her home. [facility] called at this time.

## 2022-04-24 NOTE — ED Notes (Signed)
Pt is 97% on RA resting and in no respiratory distress.

## 2022-05-02 ENCOUNTER — Other Ambulatory Visit: Payer: Self-pay

## 2022-05-02 ENCOUNTER — Inpatient Hospital Stay (HOSPITAL_COMMUNITY)
Admission: EM | Admit: 2022-05-02 | Discharge: 2022-05-07 | DRG: 291 | Disposition: A | Discharge status: Home Health | Payer: Medicare HMO | Attending: Internal Medicine | Admitting: Internal Medicine

## 2022-05-02 ENCOUNTER — Encounter (HOSPITAL_COMMUNITY): Payer: Self-pay

## 2022-05-02 ENCOUNTER — Emergency Department (HOSPITAL_COMMUNITY): Payer: Medicare HMO

## 2022-05-02 DIAGNOSIS — Z6824 Body mass index (BMI) 24.0-24.9, adult: Secondary | ICD-10-CM

## 2022-05-02 DIAGNOSIS — I824Y2 Acute embolism and thrombosis of unspecified deep veins of left proximal lower extremity: Secondary | ICD-10-CM | POA: Diagnosis present

## 2022-05-02 DIAGNOSIS — H9190 Unspecified hearing loss, unspecified ear: Secondary | ICD-10-CM | POA: Diagnosis present

## 2022-05-02 DIAGNOSIS — E876 Hypokalemia: Secondary | ICD-10-CM | POA: Diagnosis not present

## 2022-05-02 DIAGNOSIS — Z86711 Personal history of pulmonary embolism: Secondary | ICD-10-CM

## 2022-05-02 DIAGNOSIS — J069 Acute upper respiratory infection, unspecified: Secondary | ICD-10-CM | POA: Diagnosis present

## 2022-05-02 DIAGNOSIS — I351 Nonrheumatic aortic (valve) insufficiency: Secondary | ICD-10-CM | POA: Diagnosis present

## 2022-05-02 DIAGNOSIS — F32A Depression, unspecified: Secondary | ICD-10-CM | POA: Diagnosis present

## 2022-05-02 DIAGNOSIS — R0902 Hypoxemia: Principal | ICD-10-CM

## 2022-05-02 DIAGNOSIS — I2699 Other pulmonary embolism without acute cor pulmonale: Secondary | ICD-10-CM | POA: Diagnosis present

## 2022-05-02 DIAGNOSIS — J690 Pneumonitis due to inhalation of food and vomit: Secondary | ICD-10-CM | POA: Diagnosis present

## 2022-05-02 DIAGNOSIS — J9601 Acute respiratory failure with hypoxia: Secondary | ICD-10-CM | POA: Diagnosis present

## 2022-05-02 DIAGNOSIS — I509 Heart failure, unspecified: Secondary | ICD-10-CM | POA: Diagnosis present

## 2022-05-02 DIAGNOSIS — Z66 Do not resuscitate: Secondary | ICD-10-CM | POA: Diagnosis present

## 2022-05-02 DIAGNOSIS — Z86010 Personal history of colonic polyps: Secondary | ICD-10-CM

## 2022-05-02 DIAGNOSIS — I10 Essential (primary) hypertension: Secondary | ICD-10-CM | POA: Diagnosis present

## 2022-05-02 DIAGNOSIS — I82432 Acute embolism and thrombosis of left popliteal vein: Secondary | ICD-10-CM | POA: Diagnosis present

## 2022-05-02 DIAGNOSIS — Z888 Allergy status to other drugs, medicaments and biological substances status: Secondary | ICD-10-CM

## 2022-05-02 DIAGNOSIS — I5033 Acute on chronic diastolic (congestive) heart failure: Secondary | ICD-10-CM | POA: Diagnosis present

## 2022-05-02 DIAGNOSIS — I110 Hypertensive heart disease with heart failure: Principal | ICD-10-CM | POA: Diagnosis present

## 2022-05-02 DIAGNOSIS — Z86718 Personal history of other venous thrombosis and embolism: Secondary | ICD-10-CM

## 2022-05-02 DIAGNOSIS — Z961 Presence of intraocular lens: Secondary | ICD-10-CM | POA: Diagnosis present

## 2022-05-02 DIAGNOSIS — I482 Chronic atrial fibrillation, unspecified: Secondary | ICD-10-CM | POA: Diagnosis present

## 2022-05-02 DIAGNOSIS — I480 Paroxysmal atrial fibrillation: Secondary | ICD-10-CM | POA: Diagnosis present

## 2022-05-02 DIAGNOSIS — T17998A Other foreign object in respiratory tract, part unspecified causing other injury, initial encounter: Secondary | ICD-10-CM

## 2022-05-02 DIAGNOSIS — E440 Moderate protein-calorie malnutrition: Secondary | ICD-10-CM | POA: Diagnosis present

## 2022-05-02 DIAGNOSIS — L89152 Pressure ulcer of sacral region, stage 2: Secondary | ICD-10-CM | POA: Diagnosis present

## 2022-05-02 DIAGNOSIS — I959 Hypotension, unspecified: Secondary | ICD-10-CM | POA: Diagnosis present

## 2022-05-02 DIAGNOSIS — M8580 Other specified disorders of bone density and structure, unspecified site: Secondary | ICD-10-CM | POA: Diagnosis present

## 2022-05-02 DIAGNOSIS — Z79899 Other long term (current) drug therapy: Secondary | ICD-10-CM

## 2022-05-02 DIAGNOSIS — N390 Urinary tract infection, site not specified: Secondary | ICD-10-CM | POA: Diagnosis present

## 2022-05-02 DIAGNOSIS — Z9842 Cataract extraction status, left eye: Secondary | ICD-10-CM

## 2022-05-02 DIAGNOSIS — Z923 Personal history of irradiation: Secondary | ICD-10-CM

## 2022-05-02 DIAGNOSIS — K219 Gastro-esophageal reflux disease without esophagitis: Secondary | ICD-10-CM | POA: Diagnosis present

## 2022-05-02 DIAGNOSIS — E7800 Pure hypercholesterolemia, unspecified: Secondary | ICD-10-CM | POA: Diagnosis present

## 2022-05-02 DIAGNOSIS — R1310 Dysphagia, unspecified: Secondary | ICD-10-CM | POA: Diagnosis present

## 2022-05-02 DIAGNOSIS — Z515 Encounter for palliative care: Secondary | ICD-10-CM

## 2022-05-02 DIAGNOSIS — Z825 Family history of asthma and other chronic lower respiratory diseases: Secondary | ICD-10-CM

## 2022-05-02 DIAGNOSIS — Z9841 Cataract extraction status, right eye: Secondary | ICD-10-CM

## 2022-05-02 DIAGNOSIS — F419 Anxiety disorder, unspecified: Secondary | ICD-10-CM | POA: Diagnosis present

## 2022-05-02 DIAGNOSIS — L89102 Pressure ulcer of unspecified part of back, stage 2: Secondary | ICD-10-CM | POA: Diagnosis present

## 2022-05-02 DIAGNOSIS — Z853 Personal history of malignant neoplasm of breast: Secondary | ICD-10-CM

## 2022-05-02 DIAGNOSIS — J189 Pneumonia, unspecified organism: Secondary | ICD-10-CM | POA: Insufficient documentation

## 2022-05-02 DIAGNOSIS — I444 Left anterior fascicular block: Secondary | ICD-10-CM | POA: Diagnosis present

## 2022-05-02 DIAGNOSIS — L8990 Pressure ulcer of unspecified site, unspecified stage: Secondary | ICD-10-CM | POA: Diagnosis present

## 2022-05-02 DIAGNOSIS — Z882 Allergy status to sulfonamides status: Secondary | ICD-10-CM

## 2022-05-02 DIAGNOSIS — Z7901 Long term (current) use of anticoagulants: Secondary | ICD-10-CM

## 2022-05-02 DIAGNOSIS — I82413 Acute embolism and thrombosis of femoral vein, bilateral: Secondary | ICD-10-CM | POA: Diagnosis present

## 2022-05-02 DIAGNOSIS — Z8249 Family history of ischemic heart disease and other diseases of the circulatory system: Secondary | ICD-10-CM

## 2022-05-02 DIAGNOSIS — Z881 Allergy status to other antibiotic agents status: Secondary | ICD-10-CM

## 2022-05-02 DIAGNOSIS — Z885 Allergy status to narcotic agent status: Secondary | ICD-10-CM

## 2022-05-02 LAB — CBC
HCT: 36.6 % (ref 36.0–46.0)
Hemoglobin: 11.7 g/dL — ABNORMAL LOW (ref 12.0–15.0)
MCH: 33.7 pg (ref 26.0–34.0)
MCHC: 32 g/dL (ref 30.0–36.0)
MCV: 105.5 fL — ABNORMAL HIGH (ref 80.0–100.0)
Platelets: 209 10*3/uL (ref 150–400)
RBC: 3.47 MIL/uL — ABNORMAL LOW (ref 3.87–5.11)
RDW: 15.4 % (ref 11.5–15.5)
WBC: 17.2 10*3/uL — ABNORMAL HIGH (ref 4.0–10.5)
nRBC: 0 % (ref 0.0–0.2)

## 2022-05-02 LAB — BASIC METABOLIC PANEL
Anion gap: 9 (ref 5–15)
BUN: 18 mg/dL (ref 8–23)
CO2: 19 mmol/L — ABNORMAL LOW (ref 22–32)
Calcium: 8.2 mg/dL — ABNORMAL LOW (ref 8.9–10.3)
Chloride: 112 mmol/L — ABNORMAL HIGH (ref 98–111)
Creatinine, Ser: 0.64 mg/dL (ref 0.44–1.00)
GFR, Estimated: >60 mL/min (ref >60)
Glucose, Bld: 103 mg/dL — ABNORMAL HIGH (ref 70–99)
Potassium: 3.4 mmol/L — ABNORMAL LOW (ref 3.5–5.1)
Sodium: 140 mmol/L (ref 135–145)

## 2022-05-02 LAB — MAGNESIUM: Magnesium: 2 mg/dL (ref 1.7–2.4)

## 2022-05-02 LAB — LACTIC ACID, PLASMA
Lactic Acid, Venous: 1.1 mmol/L (ref 0.5–1.9)
Lactic Acid, Venous: 1.2 mmol/L (ref 0.5–1.9)

## 2022-05-02 LAB — BRAIN NATRIURETIC PEPTIDE: B Natriuretic Peptide: 113.5 pg/mL — ABNORMAL HIGH (ref 0.0–100.0)

## 2022-05-02 LAB — GLUCOSE, CAPILLARY: Glucose-Capillary: 94 mg/dL (ref 70–99)

## 2022-05-02 MED ORDER — ONDANSETRON HCL 4 MG/2ML IJ SOLN: 4.0000 mg | Freq: Four times a day (QID) | INTRAMUSCULAR | Status: DC | PRN

## 2022-05-02 MED ORDER — SENNOSIDES-DOCUSATE SODIUM 8.6-50 MG PO TABS
1.0000 | ORAL_TABLET | Freq: Two times a day (BID) | ORAL | Status: DC | PRN
Start: 2022-05-02 — End: 2022-05-07

## 2022-05-02 MED ORDER — POLYETHYLENE GLYCOL 3350 17 G PO PACK: 17.0000 g | PACK | Freq: Two times a day (BID) | ORAL | Status: DC | PRN

## 2022-05-02 MED ORDER — DIGOXIN 0.25 MG/ML IJ SOLN
0.1250 mg | Freq: Four times a day (QID) | INTRAMUSCULAR | Status: DC
  Administered 2022-05-02 – 2022-05-03 (×3): 0.125 mg via INTRAVENOUS
  Filled 2022-05-02 (×3): qty 2

## 2022-05-02 MED ORDER — ACETAMINOPHEN 325 MG PO TABS
650.0000 mg | ORAL_TABLET | Freq: Four times a day (QID) | ORAL | Status: DC | PRN
Start: 2022-05-02 — End: 2022-05-07
  Administered 2022-05-05: 650 mg via ORAL
  Filled 2022-05-02 (×2): qty 2

## 2022-05-02 MED ORDER — SODIUM CHLORIDE 0.9 % IV SOLN: 250.0000 mL | INTRAVENOUS | Status: DC | PRN

## 2022-05-02 MED ORDER — ACETAMINOPHEN 325 MG PO TABS: 650.0000 mg | ORAL_TABLET | ORAL | Status: DC | PRN

## 2022-05-02 MED ORDER — MIRTAZAPINE 15 MG PO TABS
15.0000 mg | ORAL_TABLET | Freq: Every day | ORAL | Status: DC
  Administered 2022-05-02 – 2022-05-06 (×5): 15 mg via ORAL
  Filled 2022-05-02 (×5): qty 1

## 2022-05-02 MED ORDER — ONDANSETRON HCL 4 MG PO TABS
4.0000 mg | ORAL_TABLET | Freq: Four times a day (QID) | ORAL | Status: DC | PRN
Start: 2022-05-02 — End: 2022-05-07

## 2022-05-02 MED ORDER — IPRATROPIUM BROMIDE 0.02 % IN SOLN
0.5000 mg | Freq: Three times a day (TID) | RESPIRATORY_TRACT | Status: DC
Start: 2022-05-03 — End: 2022-05-03
  Administered 2022-05-03 (×3): 0.5 mg via RESPIRATORY_TRACT
  Filled 2022-05-02 (×3): qty 2.5

## 2022-05-02 MED ORDER — SERTRALINE HCL 50 MG PO TABS
25.0000 mg | ORAL_TABLET | Freq: Every day | ORAL | Status: DC
  Administered 2022-05-03 – 2022-05-07 (×5): 25 mg via ORAL
  Filled 2022-05-02 (×6): qty 1

## 2022-05-02 MED ORDER — PANTOPRAZOLE SODIUM 40 MG PO TBEC
40.0000 mg | DELAYED_RELEASE_TABLET | Freq: Every day | ORAL | Status: DC
  Administered 2022-05-02 – 2022-05-07 (×6): 40 mg via ORAL
  Filled 2022-05-02 (×6): qty 1

## 2022-05-02 MED ORDER — SODIUM CHLORIDE 0.9% FLUSH
3.0000 mL | Freq: Two times a day (BID) | INTRAVENOUS | Status: DC
  Administered 2022-05-03 – 2022-05-07 (×6): 3 mL via INTRAVENOUS

## 2022-05-02 MED ORDER — IOHEXOL 350 MG/ML SOLN
54.0000 mL | Freq: Once | INTRAVENOUS | Status: AC | PRN
  Administered 2022-05-02: 54 mL via INTRAVENOUS

## 2022-05-02 MED ORDER — BENZONATATE 100 MG PO CAPS
100.0000 mg | ORAL_CAPSULE | Freq: Three times a day (TID) | ORAL | Status: DC
  Administered 2022-05-02 – 2022-05-07 (×10): 100 mg via ORAL
  Filled 2022-05-02 (×10): qty 1

## 2022-05-02 MED ORDER — SODIUM CHLORIDE 0.9% FLUSH: 3.0000 mL | INTRAVENOUS | Status: DC | PRN

## 2022-05-02 MED ORDER — MEGESTROL ACETATE 400 MG/10ML PO SUSP
400.0000 mg | Freq: Two times a day (BID) | ORAL | Status: DC
  Administered 2022-05-03 – 2022-05-07 (×8): 400 mg via ORAL
  Filled 2022-05-02 (×10): qty 10

## 2022-05-02 MED ORDER — SODIUM CHLORIDE 0.9 % IV SOLN: 2.0000 g | Freq: Once | INTRAVENOUS | Status: DC

## 2022-05-02 MED ORDER — APIXABAN 5 MG PO TABS
5.0000 mg | ORAL_TABLET | Freq: Two times a day (BID) | ORAL | Status: DC
Start: 2022-05-02 — End: 2022-05-06
  Administered 2022-05-02 – 2022-05-05 (×7): 5 mg via ORAL
  Filled 2022-05-02 (×8): qty 1

## 2022-05-02 MED ORDER — METOPROLOL TARTRATE 5 MG/5ML IV SOLN
2.5000 mg | Freq: Four times a day (QID) | INTRAVENOUS | Status: DC | PRN
Start: 2022-05-02 — End: 2022-05-05

## 2022-05-02 MED ORDER — IPRATROPIUM BROMIDE 0.02 % IN SOLN
0.5000 mg | Freq: Four times a day (QID) | RESPIRATORY_TRACT | Status: DC
  Administered 2022-05-02: 0.5 mg via RESPIRATORY_TRACT
  Filled 2022-05-02: qty 2.5

## 2022-05-02 MED ORDER — AMPICILLIN-SULBACTAM SODIUM 3 (2-1) G IJ SOLR
3.0000 g | Freq: Four times a day (QID) | INTRAMUSCULAR | Status: DC
  Administered 2022-05-02 – 2022-05-03 (×3): 3 g via INTRAVENOUS
  Filled 2022-05-02 (×5): qty 8

## 2022-05-02 MED ORDER — FUROSEMIDE 10 MG/ML IJ SOLN
20.0000 mg | Freq: Once | INTRAMUSCULAR | Status: AC
Start: 2022-05-02 — End: 2022-05-02
  Administered 2022-05-02: 20 mg via INTRAVENOUS
  Filled 2022-05-02: qty 2

## 2022-05-02 MED ORDER — POTASSIUM CHLORIDE 10 MEQ/100ML IV SOLN
10.0000 meq | INTRAVENOUS | Status: AC
  Administered 2022-05-02 – 2022-05-03 (×3): 10 meq via INTRAVENOUS
  Filled 2022-05-02 (×4): qty 100

## 2022-05-02 MED ORDER — GUAIFENESIN ER 600 MG PO TB12
1200.0000 mg | ORAL_TABLET | Freq: Two times a day (BID) | ORAL | Status: DC
  Administered 2022-05-03 – 2022-05-07 (×9): 1200 mg via ORAL
  Filled 2022-05-02 (×10): qty 2

## 2022-05-02 MED ORDER — VANCOMYCIN HCL 1250 MG/250ML IV SOLN
1250.0000 mg | Freq: Once | INTRAVENOUS | Status: DC
  Filled 2022-05-02: qty 250

## 2022-05-02 MED ORDER — AMITRIPTYLINE HCL 25 MG PO TABS
25.0000 mg | ORAL_TABLET | Freq: Two times a day (BID) | ORAL | Status: DC
  Administered 2022-05-02 – 2022-05-07 (×10): 25 mg via ORAL
  Filled 2022-05-02 (×11): qty 1

## 2022-05-02 NOTE — ED Provider Notes (Signed)
[facility] EMERGENCY DEPARTMENT Provider Note   CSN: [id number] Arrival date & time: 05/02/22  1134     History  No chief complaint on file.   Natalie West is a 88 y.o. female.  The history is provided by the patient and medical records. No language interpreter was used.  Shortness of Breath Severity:  Severe Onset quality:  Gradual Duration:  2 days Timing:  Constant Progression:  Worsening Chronicity:  New Context: URI   Relieved by:  Nothing Worsened by:  Coughing Ineffective treatments:  None tried Associated symptoms: chest pain, cough and sputum production   Associated symptoms: no abdominal pain, no diaphoresis, no fever, no headaches, no vomiting and no wheezing   Risk factors: hx of PE/DVT        Home Medications Prior to Admission medications   Medication Sig Start Date End Date Taking? Authorizing Provider  acetaminophen (TYLENOL) 325 MG tablet Take 2 tablets (650 mg total) by mouth every 6 (six) hours as needed. Patient taking differently: Take 650 mg by mouth every 6 (six) hours as needed for moderate pain or headache. 01/28/22   Maczis, Barth Kirks, PA-C  amitriptyline (ELAVIL) 25 MG tablet Take 1 tablet (25 mg total) by mouth in the morning and at bedtime. 04/16/22   Barton Dubois, MD  apixaban (ELIQUIS) 5 MG TABS tablet Take 2 tabs by mouth twice a day for 6 days; then start taking 1 tablet by mouth twice a day. 04/16/22   Barton Dubois, MD  atenolol (TENORMIN) 25 MG tablet Take 0.5 tablets (12.5 mg total) by mouth daily. 01/23/20   Troy Sine, MD  benzonatate (TESSALON) 100 MG capsule Take 1 capsule (100 mg total) by mouth every 8 (eight) hours. 04/24/22   Redwine, Madison A, PA-C  Ensure (ENSURE) Take 237 mLs by mouth daily.    [provider]  furosemide (LASIX) 20 MG tablet Take 1 tablet (20 mg total) by mouth daily as needed for fluid or edema. 04/16/22   Barton Dubois, MD  megestrol (MEGACE) 400 MG/10ML suspension Take 10 mLs  (400 mg total) by mouth 2 (two) times daily. 04/16/22 06/15/22  Barton Dubois, MD  mirtazapine (REMERON) 15 MG tablet Take 1 tablet (15 mg total) by mouth at bedtime. 04/16/22   Barton Dubois, MD  Multiple Vitamin (MULTIVITAMIN WITH MINERALS) TABS tablet Take 1 tablet by mouth daily. Centrum Silver for Women Patient not taking: Reported on 04/13/2022    [provider]  ondansetron (ZOFRAN) 4 MG tablet Take 4 mg by mouth every 6 (six) hours as needed for nausea or vomiting.    [provider]  pantoprazole (PROTONIX) 40 MG tablet Take 1 tablet (40 mg total) by mouth daily. 1 Tablet Daily for Heartburn and Acid Reflux 04/16/22   Barton Dubois, MD  polyethylene glycol (MIRALAX / GLYCOLAX) 17 g packet Take 17 g by mouth 2 (two) times daily as needed for mild constipation. 01/28/22   Maczis, Barth Kirks, PA-C  pravastatin (PRAVACHOL) 20 MG tablet Take 20 mg by mouth at bedtime.     [provider]  senna-docusate (SENOKOT-S) 8.6-50 MG tablet Take 1 tablet by mouth 2 (two) times daily as needed for mild constipation. 01/28/22   Maczis, Barth Kirks, PA-C  sertraline (ZOLOFT) 25 MG tablet Take 1 tablet (25 mg total) by mouth daily. 04/16/22   Barton Dubois, MD  traMADol (ULTRAM) 50 MG tablet Take 1 tablet (50 mg total) by mouth every 6 (six) hours as needed  for severe pain. 01/28/22   Maczis, Barth Kirks, PA-C      Allergies    Ciprofloxacin, Clindamycin/lincomycin, Codeine, Diclofenac, Fenoprofen calcium, Flexeril [cyclobenzaprine hcl], Hydrocodone, Macrobid [nitrofurantoin], Noroxin [norfloxacin], Relafen [nabumetone], and Sulfa antibiotics    Review of Systems   Review of Systems  Constitutional:  Positive for chills and fatigue. Negative for diaphoresis and fever.  HENT:  Negative for congestion.   Respiratory:  Positive for cough, sputum production, chest tightness and shortness of breath. Negative for wheezing.   Cardiovascular:  Positive for chest pain. Negative for palpitations  and leg swelling.  Gastrointestinal:  Negative for abdominal pain, constipation, diarrhea, nausea and vomiting.  Genitourinary:  Negative for dysuria and flank pain.  Musculoskeletal:  Negative for back pain.  Skin:  Negative for wound.  Neurological:  Negative for dizziness, light-headedness and headaches.  Psychiatric/Behavioral:  Negative for agitation.   All other systems reviewed and are negative.   Physical Exam Updated Vital Signs BP 113/71   Pulse (!) 127   Temp 98.3 F (36.8 C) (Oral)   Resp (!) 28   Ht '5\' 2"'$  (1.575 m)   Wt 60 kg   LMP 11/03/1983   SpO2 94%   BMI 24.19 kg/m  Physical Exam Vitals and nursing note reviewed.  Constitutional:      General: She is not in acute distress.    Appearance: She is well-developed. She is not ill-appearing, toxic-appearing or diaphoretic.  HENT:     Head: Normocephalic and atraumatic.     Nose: Nose normal.     Mouth/Throat:     Mouth: Mucous membranes are moist.  Eyes:     Extraocular Movements: Extraocular movements intact.     Conjunctiva/sclera: Conjunctivae normal.     Pupils: Pupils are equal, round, and reactive to light.  Cardiovascular:     Rate and Rhythm: Regular rhythm. Tachycardia present.     Heart sounds: No murmur heard. Pulmonary:     Effort: Pulmonary effort is normal. No respiratory distress.     Breath sounds: Rhonchi present. No wheezing or rales.  Chest:     Chest wall: Tenderness present.  Abdominal:     Palpations: Abdomen is soft.     Tenderness: There is no abdominal tenderness. There is no guarding or rebound.  Musculoskeletal:        General: No swelling or tenderness.     Cervical back: Neck supple. No tenderness.     Right lower leg: No edema.     Left lower leg: No edema.  Skin:    General: Skin is warm and dry.     Capillary Refill: Capillary refill takes less than 2 seconds.     Findings: No erythema or rash.  Neurological:     General: No focal deficit present.     Mental  Status: She is alert.  Psychiatric:        Mood and Affect: Mood normal.     ED Results / Procedures / Treatments   Labs (all labs ordered are listed, but only abnormal results are displayed) Labs Reviewed  BASIC METABOLIC PANEL - Abnormal; Notable for the following components:      Result Value   Potassium 3.4 (*)    Chloride 112 (*)    CO2 19 (*)    Glucose, Bld 103 (*)    Calcium 8.2 (*)    All other components within normal limits  CBC - Abnormal; Notable for the following components:   WBC 17.2 (*)  RBC 3.47 (*)    Hemoglobin 11.7 (*)    MCV 105.5 (*)    All other components within normal limits  CULTURE, BLOOD (ROUTINE X 2)  CULTURE, BLOOD (ROUTINE X 2)  LACTIC ACID, PLASMA  LACTIC ACID, PLASMA  URINALYSIS, ROUTINE W REFLEX MICROSCOPIC  BRAIN NATRIURETIC PEPTIDE    EKG None  Radiology DG Chest 2 View  Result Date: 05/02/2022 CLINICAL DATA:  cp/aspiration EXAM: CHEST - 2 VIEW COMPARISON:  April 24, 2022, April 12 2022 FINDINGS: The cardiomediastinal silhouette is unchanged and enlarged in contour.Atherosclerotic calcifications of the aorta. Small bilateral pleural effusions. No pneumothorax. Similar appearance of diffuse reticular prominence likely reflecting underlying pulmonary fibrosis. Mild reticulonodularity within the RIGHT lower lung. Visualized abdomen is unremarkable. Multilevel degenerative changes of the thoracic spine; osteopenia limits evaluation. Surgical clips project over the RIGHT breast. IMPRESSION: Mild reticulonodularity of the RIGHT lung base may reflect sequela of aspiration. Bilateral pleural effusions with similar appearance of background of likely pulmonary fibrosis. Electronically Signed   By: Valentino Saxon M.D.   On: 05/02/2022 13:16    Procedures Procedures    Medications Ordered in ED Medications  iohexol (OMNIPAQUE) 350 MG/ML injection 54 mL (54 mLs Intravenous Contrast Given 05/02/22 1536)    ED Course/ Medical Decision Making/  A&P                           Medical Decision Making Amount and/or Complexity of Data Reviewed Labs: ordered. Radiology: ordered.  Risk Prescription drug management.    AILANY KOREN is a 88 y.o. female with a past medical history significant for recent diagnosis of pulmonary emboli on Eliquis, currently on Augmentin for pneumonia, GERD, depression, hypercholesterolemia, and heart failure who presents with worsened pleuritic chest pain, shortness of breath, tachycardia, and hypoxia.  According to patient, while taken a liquid cough medicine today, patient had a choking and coughing spell and think she may have aspirated.  Patient found have oxygen saturations in the mid 80s on room air and she does not take oxygen at home.  She has continued to have cough and is having some sharp chest discomfort with deep breathing.  She reports some chills but no fevers reported.  She denies any new leg pain or leg swelling at this time.  Denies any nausea, vomiting, constipation, or diarrhea.  She feels tired.  Denies other complaints on arrival.  On my exam, patient is tachycardic and tachypneic.   oxygen saturations were found to be in the 80s during my evaluation so we placed her back on 3 L nasal cannula oxygen and her oxygen saturation improved in the 90s.  She does not take oxygen at home so will likely need admission for the hypoxia.  Legs nontender and nonedematous.  Patient resting comfortably otherwise.  Abdomen nontender but chest was slightly tender.  EKG does not show STEMI.  That shows faster A-fib than prior.  Clinically I am concerned the patient could have worsening pneumonia given the cough, vital signs, and now hypoxia.  Unclear if this was just aspiration or the worsening pneumonia.  With her history of recent pulmonary embolism and the new short of breath with hypoxia, I do feel we need to repeat CT PE study to rule out new large clots.  X-ray was obtained from triage and does show evidence  of possible aspiration.  Due to the new hypoxia, anticipate admission after CT PE study and other labs.  Anticipate admission  Care transferred to oncoming team to await results of CT PE study.  Anticipate admission for new hypoxia in the setting of likely aspiration pneumonitis versus worsened pneumonia versus PE based on the results.        Final Clinical Impression(s) / ED Diagnoses Final diagnoses:  Hypoxia  Aspiration of liquid, initial encounter    Clinical Impression: 1. Hypoxia   2. Aspiration of liquid, initial encounter     Disposition: Admit  This note was prepared with assistance of Dragon voice recognition software. Occasional wrong-word or sound-a-like substitutions may have occurred due to the inherent limitations of voice recognition software.      Trelyn Vanderlinde, Gwenyth Allegra, MD 05/02/22 1556

## 2022-05-02 NOTE — ED Triage Notes (Signed)
Patient arrived from home by [facility]-patient per EMS is bed bound and had new caregiver today. Patient has been taking antibiotic for pneumonia and when liquid cough medicine was given the patient has had increased cough, care giver wanting to assure no aspiration. Sats 87% RA, alert to baseline

## 2022-05-02 NOTE — ED Notes (Signed)
Back from CT

## 2022-05-02 NOTE — H&P (Signed)
History and Physical    Natalie West DOB: [birth year] DOA: 05/02/2022  PCP: Christain Sacramento, MD (Confirm with patient/family/NH records and if not entered, this has to be entered at [facility] point of entry) Patient coming from: Home  I have personally briefly reviewed patient's old medical records in [place]  Chief Complaint: Palpitations, shortness of breath  HPI: Natalie West is a 88 y.o. female with medical history significant of PAF, recently diagnosed RLL PE and acute DVT on Eliquis, HTN, breast cancer, anxiety/depression, GERD, moderate protein calorie malnutrition, presented with cough palpitation.  Patient was found to be hypoxic this morning O2 sat ration 87% room air.  Before today, patient was evaluated by PCP and has been on p.o. antibiotics to treat pneumonia.  Patient does admit that she developed cough and choke after eating drink for the last few days.  Nonproductive, denies any fever or chills, no chest pains.  ED Course: Patient was found to be in rapid A-fib, saturations stabilized on 2 L, CT angiogram negative for PE but pulmonary congestion was found.  Blood work WBC 17.2, hemoglobin 11.7, K3.4, bicarb 19.  Review of Systems: As per HPI otherwise 14 point review of systems negative.    Past Medical History:  Diagnosis Date   Anxiety    Arthritis    Breast cancer ([place]) 2006   right breast   Chronic cough    Chronic UTI    Depression    Dysrhythmia    Esophageal reflux    Esophageal stricture    Family history of malignant neoplasm of gastrointestinal tract    GERD (gastroesophageal reflux disease)    Hearing loss    Hemorrhoids    internal   History of hiatal hernia    Hypercholesterolemia    Hypertension    Numbness and tingling    feeet, occasionally   Osteopenia    Personal history of colonic polyps 02/09/2007   TUBULAR ADENOMA   Personal history of radiation therapy    PONV (postoperative nausea and vomiting)    trouble opening mouth  and jaw, jaw pops, trouble turning head   Rotator cuff tear    RT   Status post dilation of esophageal narrowing    Umbilical hernia     Past Surgical History:  Procedure Laterality Date   BREAST LUMPECTOMY  2006   right lumpectomy   CATARACT EXTRACTION W/ INTRAOCULAR LENS IMPLANT Bilateral    COLONOSCOPY     CYSTOCELE REPAIR N/A 04/20/2016   Procedure: AUGMENTED ANTERIOR VAULT REPAIR, COLOPLAST DERMIS GRAFT KELLY PLICATION SACROSPINOUS FIXATION;  Surgeon: Carolan Clines, MD;  Location: [facility];  Service: Urology;  Laterality: N/A;   DILATION AND CURETTAGE OF UTERUS     ESOPHAGOGASTRODUODENOSCOPY ENDOSCOPY     PUBOVAGINAL SLING N/A 04/20/2016   Procedure: PUBO-VAGINAL [name];  Surgeon: Carolan Clines, MD;  Location: [facility];  Service: Urology;  Laterality: N/A;   REVISION URINARY SLING N/A 06/12/2016   Procedure: INCISION OF URETHRAL SLING;  Surgeon: Carolan Clines, MD;  Location: [facility];  Service: Urology;  Laterality: N/A;  1 HOUR   SHOULDER OPEN ROTATOR CUFF REPAIR Right 02/28/2013   Procedure: RIGHT ROTATOR CUFF REPAIR SHOULDER OPEN WITH GRAFT AND ANCHORS;  Surgeon: Tobi Bastos, MD;  Location: [facility];  Service: Orthopedics;  Laterality: Right;   UMBILICAL HERNIA REPAIR       reports that she has never smoked. She has never used smokeless tobacco. She reports that she does not  drink alcohol and does not use drugs.  Allergies  Allergen Reactions   Ciprofloxacin Other (See Comments)    Reaction unknown   Clindamycin/Lincomycin Other (See Comments)    Reaction unknown    Codeine Nausea And Vomiting   Diclofenac Other (See Comments)    Reaction unknown    Fenoprofen Calcium Other (See Comments)    Reaction unknown    Flexeril [Cyclobenzaprine Hcl] Other (See Comments)    Reaction unknown    Hydrocodone Other (See Comments)    "Last time it was too strong and messed up my mind"   Macrobid [Nitrofurantoin] Other (See Comments)    Reaction unknown     Noroxin [Norfloxacin] Other (See Comments)    Reaction unknown    Relafen [Nabumetone] Other (See Comments)    Reaction unknown    Sulfa Antibiotics Other (See Comments)    Reaction unknown     Family History  Problem Relation Age of Onset   Colon cancer Mother 40   Kidney cancer Mother    Glaucoma Mother    Heart disease Sister    Stomach cancer Brother    Stroke Sister    Lung cancer Brother    Clotting disorder Son    Emphysema Father        smoker   Hypertension Sister      Prior to Admission medications   Medication Sig Start Date End Date Taking? Authorizing Provider  acetaminophen (TYLENOL) 325 MG tablet Take 2 tablets (650 mg total) by mouth every 6 (six) hours as needed. Patient taking differently: Take 650 mg by mouth every 6 (six) hours as needed for moderate pain or headache. 01/28/22   Maczis, Barth Kirks, PA-C  amitriptyline (ELAVIL) 25 MG tablet Take 1 tablet (25 mg total) by mouth in the morning and at bedtime. 04/16/22   Barton Dubois, MD  apixaban (ELIQUIS) 5 MG TABS tablet Take 2 tabs by mouth twice a day for 6 days; then start taking 1 tablet by mouth twice a day. 04/16/22   Barton Dubois, MD  atenolol (TENORMIN) 25 MG tablet Take 0.5 tablets (12.5 mg total) by mouth daily. 01/23/20   Troy Sine, MD  benzonatate (TESSALON) 100 MG capsule Take 1 capsule (100 mg total) by mouth every 8 (eight) hours. 04/24/22   Redwine, Madison A, PA-C  Ensure (ENSURE) Take 237 mLs by mouth daily.    [provider]  furosemide (LASIX) 20 MG tablet Take 1 tablet (20 mg total) by mouth daily as needed for fluid or edema. 04/16/22   Barton Dubois, MD  megestrol (MEGACE) 400 MG/10ML suspension Take 10 mLs (400 mg total) by mouth 2 (two) times daily. 04/16/22 06/15/22  Barton Dubois, MD  mirtazapine (REMERON) 15 MG tablet Take 1 tablet (15 mg total) by mouth at bedtime. 04/16/22   Barton Dubois, MD  Multiple Vitamin (MULTIVITAMIN WITH MINERALS) TABS tablet Take 1 tablet by  mouth daily. Centrum Silver for Women Patient not taking: Reported on 04/13/2022    [provider]  ondansetron (ZOFRAN) 4 MG tablet Take 4 mg by mouth every 6 (six) hours as needed for nausea or vomiting.    [provider]  pantoprazole (PROTONIX) 40 MG tablet Take 1 tablet (40 mg total) by mouth daily. 1 Tablet Daily for Heartburn and Acid Reflux 04/16/22   Barton Dubois, MD  polyethylene glycol (MIRALAX / GLYCOLAX) 17 g packet Take 17 g by mouth 2 (two) times daily as needed for mild constipation. 01/28/22  Maczis, Barth Kirks, PA-C  pravastatin (PRAVACHOL) 20 MG tablet Take 20 mg by mouth at bedtime.     [provider]  senna-docusate (SENOKOT-S) 8.6-50 MG tablet Take 1 tablet by mouth 2 (two) times daily as needed for mild constipation. 01/28/22   Maczis, Barth Kirks, PA-C  sertraline (ZOLOFT) 25 MG tablet Take 1 tablet (25 mg total) by mouth daily. 04/16/22   Barton Dubois, MD  traMADol (ULTRAM) 50 MG tablet Take 1 tablet (50 mg total) by mouth every 6 (six) hours as needed for severe pain. 01/28/22   Jillyn Ledger, PA-C    Physical Exam: Vitals:   05/02/22 1745 05/02/22 1800 05/02/22 1815 05/02/22 1830  BP: 92/61 101/63 107/77 98/61  Pulse: (!) 119 (!) 121 (!) 120 (!) 117  Resp: (!) 32 (!) 22 (!) 23 (!) 23  Temp:      TempSrc:      SpO2: 97% 98% 98% 97%  Weight:      Height:        Constitutional: NAD, calm, comfortable Vitals:   05/02/22 1745 05/02/22 1800 05/02/22 1815 05/02/22 1830  BP: 92/61 101/63 107/77 98/61  Pulse: (!) 119 (!) 121 (!) 120 (!) 117  Resp: (!) 32 (!) 22 (!) 23 (!) 23  Temp:      TempSrc:      SpO2: 97% 98% 98% 97%  Weight:      Height:       Eyes: PERRL, lids and conjunctivae normal ENMT: Mucous membranes are moist. Posterior pharynx clear of any exudate or lesions.Normal dentition.  Neck: normal, supple, no masses, no thyromegaly Respiratory: clear to auscultation bilaterally, no wheezing, bilateral coarse crackles on  lower fields, increasing breathing effort, talking in broken sentences.  No accessory muscle use.  Cardiovascular: Irregular heart rate, no murmurs / rubs / gallops. No extremity edema. 2+ pedal pulses. No carotid bruits.  Abdomen: no tenderness, no masses palpated. No hepatosplenomegaly. Bowel sounds positive.  Musculoskeletal: no clubbing / cyanosis. No joint deformity upper and lower extremities. Good ROM, no contractures. Normal muscle tone.  Skin: no rashes, lesions, ulcers. No induration Neurologic: CN 2-12 grossly intact. Sensation intact, DTR normal. Strength 5/5 in all 4.  Psychiatric: Normal judgment and insight. Alert and oriented x 3. Normal mood.     Labs on Admission: I have personally reviewed following labs and imaging studies  CBC: Recent Labs  Lab 05/02/22 1202  WBC 17.2*  HGB 11.7*  HCT 36.6  MCV 105.5*  PLT 546   Basic Metabolic Panel: Recent Labs  Lab 05/02/22 1202  NA 140  K 3.4*  CL 112*  CO2 19*  GLUCOSE 103*  BUN 18  CREATININE 0.64  CALCIUM 8.2*   GFR: Estimated Creatinine Clearance: 38.4 mL/min (by C-G formula based on SCr of 0.64 mg/dL). Liver Function Tests: No results for input(s): "AST", "ALT", "ALKPHOS", "BILITOT", "PROT", "ALBUMIN" in the last 168 hours. No results for input(s): "LIPASE", "AMYLASE" in the last 168 hours. No results for input(s): "AMMONIA" in the last 168 hours. Coagulation Profile: No results for input(s): "INR", "PROTIME" in the last 168 hours. Cardiac Enzymes: No results for input(s): "CKTOTAL", "CKMB", "CKMBINDEX", "TROPONINI" in the last 168 hours. BNP (last 3 results) No results for input(s): "PROBNP" in the last 8760 hours. HbA1C: No results for input(s): "HGBA1C" in the last 72 hours. CBG: No results for input(s): "GLUCAP" in the last 168 hours. Lipid Profile: No results for input(s): "CHOL", "HDL", "LDLCALC", "TRIG", "CHOLHDL", "LDLDIRECT" in the last  72 hours. Thyroid Function Tests: No results for  input(s): "TSH", "T4TOTAL", "FREET4", "T3FREE", "THYROIDAB" in the last 72 hours. Anemia Panel: No results for input(s): "VITAMINB12", "FOLATE", "FERRITIN", "TIBC", "IRON", "RETICCTPCT" in the last 72 hours. Urine analysis:    Component Value Date/Time   COLORURINE YELLOW 04/12/2022 1641   APPEARANCEUR HAZY (A) 04/12/2022 1641   LABSPEC 1.014 04/12/2022 1641   PHURINE 5.0 04/12/2022 1641   GLUCOSEU NEGATIVE 04/12/2022 1641   HGBUR SMALL (A) 04/12/2022 1641   BILIRUBINUR NEGATIVE 04/12/2022 1641   BILIRUBINUR n 02/18/2015 1404   KETONESUR NEGATIVE 04/12/2022 1641   PROTEINUR NEGATIVE 04/12/2022 1641   UROBILINOGEN negative 02/18/2015 1404   UROBILINOGEN 0.2 02/28/2013 0739   NITRITE NEGATIVE 04/12/2022 1641   LEUKOCYTESUR MODERATE (A) 04/12/2022 1641    Radiological Exams on Admission: CT Angio Chest PE W and/or Wo Contrast  Result Date: 05/02/2022 CLINICAL DATA:  Pulmonary embolism (PE) suspected, high prob Recent pneumonia and recent PE. Worsened shortness of breath, tachycardia, and hypoxia. Rule out new pulmonary emboli or large pneumonia. Also concern for aspiration today EXAM: CT ANGIOGRAPHY CHEST WITH CONTRAST TECHNIQUE: Multidetector CT imaging of the chest was performed using the standard protocol during bolus administration of intravenous contrast. Multiplanar CT image reconstructions and MIPs were obtained to evaluate the vascular anatomy. RADIATION DOSE REDUCTION: This exam was performed according to the departmental dose-optimization program which includes automated exposure control, adjustment of the mA and/or kV according to patient size and/or use of iterative reconstruction technique. CONTRAST:  3m OMNIPAQUE IOHEXOL 350 MG/ML SOLN COMPARISON:  CT dated April 12, 2022 common January 21, 2022 FINDINGS: Cardiovascular: Evaluation is limited secondary to respiratory motion, particularly at the bases. Cardiomegaly. No large pericardial effusion. Atherosclerotic calcifications of the  nonaneurysmal thoracic aorta. Main pulmonary artery is enlarged in comparison to the ascending thoracic aorta which may reflect a degree of underlying pulmonary arterial hypertension. Previously described filling defects of the RIGHT lower lobe and LEFT main pulmonary artery are not discretely visualized on today's exam. No new large pulmonary embolism. Mediastinum/Nodes: Status post RIGHT axillary node dissection. No new suspicious axillary adenopathy. Prominent mediastinal lymph nodes with representative RIGHT paratracheal lymph node measuring 10 mm in the short axis (series 6, image 88). This is not significantly changed in comparison to prior. Representative LEFT hilar lymph node superior to the LEFT upper lobe pulmonary artery measures 10 mm, similar in comparison to prior (series 6, image 80) Lungs/Pleura: Small pleural effusions. Bronchial wall thickening. Apical predominant ground-glass opacities with suggestion of interlobular septal thickening on a background of subpleural reticulation and traction bronchiectasis. Ground-glass opacities are similar 2 mildly increased in comparison to prior CT but increased since March 2023. Upper Abdomen: Small hiatal hernia. Musculoskeletal: Healing LEFT anterior fourth through sixth rib fractures. Healing RIGHT anterior third and fourth rib fractures. Unchanged wedging of T3. Bone island of T1. Exaggerated thoracic kyphosis. Review of the MIP images confirms the above findings. IMPRESSION: 1. Previously described pulmonary emboli are no longer discretely visualized. No new large pulmonary embolism within the limitations of this motion filled exam. 2. Apical predominant ground-glass opacities with suggestion of interlobular septal thickening superimposed on a background of pulmonary fibrosis. Overall, this is increased since March 2023 and is similar to mildly increased in comparison to most recent prior from June 2023. This is favored to reflect pulmonary edema given  presence of bronchial wall thickening and small bilateral pleural effusions. Differential considerations include atypical infection or progressive pulmonary interstitial lung disease such as hypersensitivity pneumonitis.  3. Enlargement of the pulmonary artery as can be seen in pulmonary arterial hypertension. 4. Mediastinal and hilar adenopathy, likely reactive. 5. Healing bilateral anterior rib fractures. Aortic Atherosclerosis (ICD10-I70.0). Electronically Signed   By: Valentino Saxon M.D.   On: 05/02/2022 16:06   DG Chest 2 View  Result Date: 05/02/2022 CLINICAL DATA:  cp/aspiration EXAM: CHEST - 2 VIEW COMPARISON:  April 24, 2022, April 12 2022 FINDINGS: The cardiomediastinal silhouette is unchanged and enlarged in contour.Atherosclerotic calcifications of the aorta. Small bilateral pleural effusions. No pneumothorax. Similar appearance of diffuse reticular prominence likely reflecting underlying pulmonary fibrosis. Mild reticulonodularity within the RIGHT lower lung. Visualized abdomen is unremarkable. Multilevel degenerative changes of the thoracic spine; osteopenia limits evaluation. Surgical clips project over the RIGHT breast. IMPRESSION: Mild reticulonodularity of the RIGHT lung base may reflect sequela of aspiration. Bilateral pleural effusions with similar appearance of background of likely pulmonary fibrosis. Electronically Signed   By: Valentino Saxon M.D.   On: 05/02/2022 13:16    EKG: Independently reviewed. Afib with rvr  Assessment/Plan Principal Problem:   CHF (congestive heart failure) ([place])  (please populate well all problems here in Problem List. (For example, if patient is on BP meds at home and you resume or decide to hold them, it is a problem that needs to be her. Same for CAD, COPD, HLD and so on)  Acute hypoxic respite failure -Probably combination of worsening of aspiration pneumonia which did not show up on the CT chest today however, meantime, there is a new onset of  heart failure, and I do suspect this is from uncontrolled A-fib. -Escalate antibiotic treatment to Unasyn, n.p.o. for now, speech evaluation tomorrow.  Discussed with patient niece/POA, appears that patient is supposed to be on soft diet however at home she has been eating regular diet. -For CHF decompensation, will give a very small dose of Lasix 20 mg, and for uncontrolled A-fib, plan to load patient with digoxin 0.15 mg x 4 doses.  And as needed Lopressor for rate control.  Aspiration pneumonia failed outpatient management -As above  SIRS -She has tachy and she has hypoxia, but no significant symptoms signs of endorgan damage.  Management as above.  A-fib with RVR -Rate control as above -Continue Eliquis.  PE DVT, recent -Continue Eliquis  Chronic dysphagia -N.p.o. for now, speech evaluation tomorrow  Moderate protein calorie malnutrition -She is uncontrolled and she is on Megace  Anxiety/depression -Continue twice daily amitriptyline and SSRI.  DVT prophylaxis: Eliquis Code Status: DNR Family Communication: Niece over the phone Disposition Plan: Patient sick with new onset CHF and concurrent aspiration pneumonia failed outpatient treatment, requiring IV diuresis and IV antibiotics, expect more than 2 midnight hospital stay Consults called: None Admission status: Telemetry admission  Lequita Halt MD [facility] Pager [phone]  05/02/2022, 6:58 PM

## 2022-05-02 NOTE — ED Notes (Signed)
Niece Natalie West [phone] would like an update asap

## 2022-05-02 NOTE — Progress Notes (Signed)
Pharmacy Antibiotic Note  Natalie West is a 88 y.o. female for which pharmacy has been consulted for unasyn dosing for  aspiration pna .  Patient with a history of AF, recently diagnosed RLL PE and acute DVT on Eliquis, HTN, breast cancer, anxiety/depression, GERD, moderate protein calorie malnutrition. Patient presenting with cough/palpitations.  Estimated Creatinine Clearance: 38.4 mL/min (by C-G formula based on SCr of 0.64 mg/dL). WBC 17.2; LA 1.1; T 98.3 F; HR 120>108; RR 28>22  Plan: Unasyn 3g q6h Trend WBC, Fever, Renal function, & Clinical course F/u cultures, clinical course, WBC, fever De-escalate when able  Height: '5\' 2"'$  (157.5 cm) Weight: 60 kg (132 lb 4.4 oz) IBW/kg (Calculated) : 50.1  Temp (24hrs), Avg:98.3 F (36.8 C), Min:98.3 F (36.8 C), Max:98.3 F (36.8 C)  Recent Labs  Lab 05/02/22 1202 05/02/22 1435  WBC 17.2*  --   CREATININE 0.64  --   LATICACIDVEN  --  1.1    Estimated Creatinine Clearance: 38.4 mL/min (by C-G formula based on SCr of 0.64 mg/dL).    Allergies  Allergen Reactions   Ciprofloxacin Other (See Comments)    Reaction unknown   Clindamycin/Lincomycin Other (See Comments)    Reaction unknown    Codeine Nausea And Vomiting   Diclofenac Other (See Comments)    Reaction unknown    Fenoprofen Calcium Other (See Comments)    Reaction unknown    Flexeril [Cyclobenzaprine Hcl] Other (See Comments)    Reaction unknown    Hydrocodone Other (See Comments)    "Last time it was too strong and messed up my mind"   Macrobid [Nitrofurantoin] Other (See Comments)    Reaction unknown    Noroxin [Norfloxacin] Other (See Comments)    Reaction unknown    Relafen [Nabumetone] Other (See Comments)    Reaction unknown    Sulfa Antibiotics Other (See Comments)    Reaction unknown     Antimicrobials this admission: unasyn 7/1 >>   Microbiology results: Pending  Thank you for allowing pharmacy to be a part of this patient's  care.  Lorelei Pont, PharmD, BCPS 05/02/2022 6:28 PM ED Clinical Pharmacist -  [phone]

## 2022-05-03 DIAGNOSIS — R1310 Dysphagia, unspecified: Secondary | ICD-10-CM

## 2022-05-03 DIAGNOSIS — I482 Chronic atrial fibrillation, unspecified: Secondary | ICD-10-CM | POA: Diagnosis present

## 2022-05-03 DIAGNOSIS — I5033 Acute on chronic diastolic (congestive) heart failure: Secondary | ICD-10-CM | POA: Diagnosis not present

## 2022-05-03 DIAGNOSIS — E440 Moderate protein-calorie malnutrition: Secondary | ICD-10-CM

## 2022-05-03 DIAGNOSIS — F32A Depression, unspecified: Secondary | ICD-10-CM | POA: Diagnosis present

## 2022-05-03 DIAGNOSIS — J189 Pneumonia, unspecified organism: Secondary | ICD-10-CM | POA: Insufficient documentation

## 2022-05-03 DIAGNOSIS — I10 Essential (primary) hypertension: Secondary | ICD-10-CM | POA: Diagnosis not present

## 2022-05-03 HISTORY — DX: Pneumonia, unspecified organism: J18.9

## 2022-05-03 LAB — BASIC METABOLIC PANEL
Anion gap: 10 (ref 5–15)
BUN: 16 mg/dL (ref 8–23)
CO2: 21 mmol/L — ABNORMAL LOW (ref 22–32)
Calcium: 8.1 mg/dL — ABNORMAL LOW (ref 8.9–10.3)
Chloride: 110 mmol/L (ref 98–111)
Creatinine, Ser: 0.74 mg/dL (ref 0.44–1.00)
GFR, Estimated: >60 mL/min (ref >60)
Glucose, Bld: 100 mg/dL — ABNORMAL HIGH (ref 70–99)
Potassium: 3.9 mmol/L (ref 3.5–5.1)
Sodium: 141 mmol/L (ref 135–145)

## 2022-05-03 LAB — GLUCOSE, CAPILLARY: Glucose-Capillary: 84 mg/dL (ref 70–99)

## 2022-05-03 MED ORDER — AMOXICILLIN-POT CLAVULANATE 875-125 MG PO TABS
1.0000 | ORAL_TABLET | Freq: Two times a day (BID) | ORAL | Status: DC
  Administered 2022-05-03 – 2022-05-05 (×5): 1 via ORAL
  Filled 2022-05-03 (×5): qty 1

## 2022-05-03 MED ORDER — IPRATROPIUM BROMIDE 0.02 % IN SOLN: 0.5000 mg | Freq: Four times a day (QID) | RESPIRATORY_TRACT | Status: DC | PRN

## 2022-05-03 MED ORDER — FOOD THICKENER (SIMPLYTHICK): 10.0000 | ORAL | Status: DC | PRN

## 2022-05-03 MED ORDER — METOPROLOL TARTRATE 25 MG PO TABS: 25.0000 mg | ORAL_TABLET | Freq: Two times a day (BID) | ORAL | Status: DC

## 2022-05-03 MED ORDER — METOPROLOL TARTRATE 25 MG PO TABS
25.0000 mg | ORAL_TABLET | Freq: Two times a day (BID) | ORAL | Status: DC
  Administered 2022-05-03 – 2022-05-07 (×8): 25 mg via ORAL
  Filled 2022-05-03 (×8): qty 1

## 2022-05-03 NOTE — Hospital Course (Addendum)
Mrs. Licea was admitted to the hospital with the working diagnosis of decompensated heart failure.   88 yo female with the past medical history of paroxysmal atrial fibrillation, hypertension, breast cancer and malnutrition who presented with palpitations and dyspnea. She had a recent diagnosis of pulmonary embolism and deep vein thrombosis. As outpatient she has been on antibiotic therapy for pneumonia, on the day of hospitalization her 02 was 87% on room air, prompting her transfer to the hospital. On her initial physical examination her blood pressure was 92/61, 101/63, HR 119 to 121, RR 32 and 02 saturation 98% on supplemental 02 per [place]. Lungs with bilateral rales and increase work of breathing, heart with S1 and S2 present, irregularly irregular, abdomen not distended, no lower extremity edema.   Na 140, K 3,4 Cl 112 bicarbonate 19, glucose 103 bun 18 cr 0,64 Wbc 17,2 hgb 11,7 plt 209   Chest radiograph with right basal scaring.  CT chest with bilateral ground glass opacities. Interlobular septal thickening.   EKG 127 bpm, left axis deviation, left anterior fascicular block, atrial fibrillation rhythm, no significant ST segment or T wave changes.   Patient received furosemide for diuresis and rate control with AV agents, including digoxin.  Her volume status and heart rate have improved. Patient on monotherapy with metoprolol and anticoagulation with apixaban.

## 2022-05-03 NOTE — Progress Notes (Signed)
Progress Note   Patient: Natalie West WUJ:[id number] DOB: 03-17-1931 DOA: 05/02/2022     1 DOS: the patient was seen and examined on 05/03/2022   Brief hospital course: Natalie West was admitted to the hospital with the working diagnosis of decompensated heart failure.   88 yo female with the past medical history of paroxysmal atrial fibrillation, hypertension, breast cancer and malnutrition who presented with palpitations and dyspnea. She had a recent diagnosis of pulmonary embolism and deep vein thrombosis. As outpatient she has been on antibiotic therapy for pneumonia, on the day of hospitalization her 02 was 87% on room air, prompting her transfer to the hospital. On her initial physical examination her blood pressure was 92/61, 101/63, HR 119 to 121, RR 32 and 02 saturation 98% on supplemental 02 per [place]. Lungs with bilateral rales and increase work of breathing, heart with S1 and S2 present, irregularly irregular, abdomen not distended, no lower extremity edema.   Na 140, K 3,4 Cl 112 bicarbonate 19, glucose 103 bun 18 cr 0,64 Wbc 17,2 hgb 11,7 plt 209   Chest radiograph with right basal scaring.  CT chest with bilateral ground glass opacities. Interlobular septal thickening.   EKG 127 bpm, left axis deviation, left anterior fascicular block, atrial fibrillation rhythm, no significant ST segment or T wave changes.   Assessment and Plan: * Acute on chronic diastolic CHF (congestive heart failure) (HCC) Echocardiogram with preserved LV systolic function EF 55 to 60%, mild asymmetric hypertrophy, RV systolic function is preserved. Mild to moderate aortic valve regurgitation.   Documented urine output is 450 ml.  Blood pressure systolic is 956 to 213 mmHg.   Plan to continue close blood pressure monitoring Hold on furosemide for now, clinically volume status has improved.   Atrial fibrillation, chronic (HCC) Atrial fibrillation with RVR Patient received digoxin on admission with improvement in  heart rate. Rhythm continue in atrial fibrillation.   Blood pressure has improved, will add metoprolol 25 mg po bid for better rate control. To prevent side effects will hold on digoxin for now.  Anticoagulation with apixaban.  Patient is not ambulatory, she had frequent falls at home.   Essential hypertension Continue close blood pressure monitoring, she was hypotensive on admission.   Protein-calorie malnutrition, moderate ([place]) Consult nutrition for recommendations.   Depression Continue with amitriptyline, sertraline, and mirtazapine.   Dysphagia Swallow dysfunction and aspiration pneumonia. (presnet on admission). Swallow evaluation performed with recommendations for dysphagia 3 diet.   Continue antibiotic therapy with Augmentin.  Continue with aspiration precautions.         Subjective: Patient is feeling better but not back to her baseline, continue to be very weak and deconditioned   Physical Exam: Vitals:   05/03/22 0732 05/03/22 1010 05/03/22 1200 05/03/22 1420  BP:  101/71 112/70   Pulse:  (!) 106 (!) 106   Resp:  19 19   Temp:  98.2 F (36.8 C) 98.1 F (36.7 C)   TempSrc:  Oral Oral   SpO2: 99% 97% 98% 98%  Weight:      Height:       Neurology awake and alert ENT With no pallor Cardiovascular with S1 and S2 present, irregularly irregular with no gallops, or rubs No JVD No lower extremity edema Respiratory with no rales or wheezing Abdomen not distended  Data Reviewed:    Family Communication: I spoke with patient's nice at the bedside, we talked in detail about patient's condition, plan of care and prognosis and all questions  were addressed.   Disposition: Status is: Inpatient Remains inpatient appropriate because: uncontrolled atrial fibrillation   Planned Discharge Destination: Skilled nursing facility    Author: Tawni Millers, MD 05/03/2022 3:36 PM  For on call review [email or web address].

## 2022-05-03 NOTE — Evaluation (Signed)
Physical Therapy Evaluation Patient Details Name: Natalie West MRN: [id number] DOB: 09/23/1931 Today's Date: 05/03/2022  History of Present Illness  88 y.o. female presents to [facility] hospital on 05/02/2022 with cough and dysphagia. Pt admitted for management of CHF and aspiration PNA. PMH: recurrent falls, GERD, Breast CA s/p lumpectomy chemo/rads in remission, HTN, HLD, a.fib (not anticoagulated), CHF, and depression.  Clinical Impression  Pt's PT impairments include ROM deficits, generalized weakness, activity intolerance, and balance deficits. Pt is disoriented and sometimes limited by pain during session, but overall tolerates therapy. Pt requires significant physical assistance to move in bed, and sits EOB requiring assistance due to posterior lean. Pt is a poor historian and PT tries to contact Pt's family (with no success) to better understand history, living situation, and prior level of function. PT recommends SNF for continued therapy and increased care, pending confirmation of Pt's history. Continued therapy will assist the Pt in improving strength and balance to progress her towards her baseline and decrease the amount of necessary assistance.     Recommendations for follow up therapy are one component of a multi-disciplinary discharge planning process, led by the attending physician.  Recommendations may be updated based on patient status, additional functional criteria and insurance authorization.  Follow Up Recommendations Skilled nursing-short term rehab (<3 hours/day) Can patient physically be transported by private vehicle: No    Assistance Recommended at Discharge Frequent or constant Supervision/Assistance  Patient can return home with the following  A lot of help with walking and/or transfers;A lot of help with bathing/dressing/bathroom;Assistance with cooking/housework;Assistance with feeding;Direct supervision/assist for medications management;Direct supervision/assist for financial  management;Assist for transportation;Help with stairs or ramp for entrance    Equipment Recommendations Wheelchair (measurements PT);Other (comment) (Manual WC; Hoyer lift)  Recommendations for Other Services       Functional Status Assessment Patient has had a recent decline in their functional status and/or demonstrates limited ability to make significant improvements in function in a reasonable and predictable amount of time     Precautions / Restrictions Precautions Precautions: Fall Restrictions Weight Bearing Restrictions: No      Mobility  Bed Mobility Overal bed mobility: Needs Assistance Bed Mobility: Supine to Sit, Sit to Supine     Supine to sit: Total assist Sit to supine: Total assist   General bed mobility comments: Pt makes efforts to walk feet off bed and use rail to pull herself but still requires total assist    Transfers                   General transfer comment: Pt refuses transfer attempts    Ambulation/Gait               General Gait Details: No gait attempts  Stairs            Wheelchair Mobility    Modified Rankin (Stroke Patients Only)       Balance Overall balance assessment: Needs assistance Sitting-balance support: Bilateral upper extremity supported, Feet supported Sitting balance-Leahy Scale: Poor Sitting balance - Comments: Pt displays posterior lean and LOB when sitting EOB Postural control: Posterior lean                                   Pertinent Vitals/Pain Pain Assessment Pain Assessment: Faces Faces Pain Scale: Hurts even more Pain Location: BLE Pain Descriptors / Indicators: Grimacing Pain Intervention(s): Monitored during session    Home Living  Family/patient expects to be discharged to:: Private residence Living Arrangements: Alone Available Help at Discharge: Family;Personal care attendant;Available PRN/intermittently (6 days/week) Type of Home: House Home Access: Stairs to  enter       Home Layout: One level Home Equipment: Cane - single point;BSC/3in1 Additional Comments: Pt is poor historian and shows signs of AMS. PT tries to call Pt family to confirm Hx and give updates with no success.    Prior Function Prior Level of Function : Needs assist       Physical Assist : ADLs (physical)   ADLs (physical): IADLs (Pt reports that her "care team" assists her with chores) Mobility Comments: Pt reports that she ambulates with SPC in home; no community ambulation       Hand Dominance        Extremity/Trunk Assessment   Upper Extremity Assessment Upper Extremity Assessment: Generalized weakness    Lower Extremity Assessment Lower Extremity Assessment: Generalized weakness;LLE deficits/detail;RLE deficits/detail (Pt demonstrates severe weakness in BLE; 3- SLR, minimal movement with abduction attempts) RLE Deficits / Details: Severe limitation in knee ROM/AROM (especially R knee) LLE Deficits / Details: Severe limitation in knee ROM/AROM (especially R knee)    Cervical / Trunk Assessment Cervical / Trunk Assessment: Kyphotic  Communication   Communication: HOH  Cognition Arousal/Alertness: Awake/alert Behavior During Therapy: WFL for tasks assessed/performed Overall Cognitive Status: Impaired/Different from baseline Area of Impairment: Orientation, Following commands, Awareness, Memory, Problem solving                 Orientation Level: Disoriented to, Time, Place, Situation   Memory: Decreased short-term memory Following Commands: Follows one step commands inconsistently   Awareness: Intellectual Problem Solving: Requires verbal cues, Requires tactile cues, Slow processing, Decreased initiation General Comments: Pt disoriented to time, place, and situation. PT tries to call Pt family for better understanding of baseline with no answer; High fear of falling        General Comments General comments (skin integrity, edema, etc.): Pt  has severe limitation in AROM of R knee - makes her attempts for mobilization difficult    Exercises     Assessment/Plan    PT Assessment Patient needs continued PT services  PT Problem List Decreased strength;Decreased range of motion;Decreased activity tolerance;Decreased balance;Decreased mobility;Decreased coordination;Decreased cognition;Decreased knowledge of use of DME;Cardiopulmonary status limiting activity       PT Treatment Interventions DME instruction;Gait training;Stair training;Functional mobility training;Therapeutic activities;Therapeutic exercise;Balance training;Patient/family education    PT Goals (Current goals can be found in the Care Plan section)  Acute Rehab PT Goals Patient Stated Goal: Return home PT Goal Formulation: With patient Time For Goal Achievement: 05/17/22 Potential to Achieve Goals: Poor    Frequency Min 2X/week     Co-evaluation               AM-PAC PT "6 Clicks" Mobility  Outcome Measure Help needed turning from your back to your side while in a flat bed without using bedrails?: Total Help needed moving from lying on your back to sitting on the side of a flat bed without using bedrails?: Total Help needed moving to and from a bed to a chair (including a wheelchair)?: Total Help needed standing up from a chair using your arms (e.g., wheelchair or bedside chair)?: Total Help needed to walk in hospital room?: Total Help needed climbing 3-5 steps with a railing? : Total 6 Click Score: 6    End of Session Equipment Utilized During Treatment: Oxygen Activity Tolerance: Patient limited by  pain Patient left: in bed;with bed alarm set;with call bell/phone within reach Nurse Communication: Mobility status PT Visit Diagnosis: History of falling (Z91.81);Muscle weakness (generalized) (M62.81)    Time: 6282-4175 PT Time Calculation (min) (ACUTE ONLY): 29 min   Charges:   PT Evaluation $PT Eval Low Complexity: 1 Low           Hall Busing, SPT Acute Rehabilitation Office #: [phone]   Hall Busing 05/03/2022, 1:10 PM

## 2022-05-03 NOTE — Progress Notes (Addendum)
MD could u change her route to a pill instead of the liquid form of Megace, pt not able to take med without coughing, Thanks Arvella Nigh RN.

## 2022-05-03 NOTE — Evaluation (Signed)
Clinical/Bedside Swallow Evaluation Patient Details  Name: Natalie West MRN: [id number] Date of Birth: 1931-09-19  Today's Date: 05/03/2022 Time: SLP Start Time (ACUTE ONLY): 4270 SLP Stop Time (ACUTE ONLY): 6237 SLP Time Calculation (min) (ACUTE ONLY): 30 min  Past Medical History:  Past Medical History:  Diagnosis Date   Anxiety    Arthritis    Breast cancer ([place]) 2006   right breast   Chronic cough    Chronic UTI    Depression    Dysrhythmia    Esophageal reflux    Esophageal stricture    Family history of malignant neoplasm of gastrointestinal tract    GERD (gastroesophageal reflux disease)    Hearing loss    Hemorrhoids    internal   History of hiatal hernia    Hypercholesterolemia    Hypertension    Numbness and tingling    feeet, occasionally   Osteopenia    Personal history of colonic polyps 02/09/2007   TUBULAR ADENOMA   Personal history of radiation therapy    PONV (postoperative nausea and vomiting)    trouble opening mouth and jaw, jaw pops, trouble turning head   Rotator cuff tear    RT   Status post dilation of esophageal narrowing    Umbilical hernia    Past Surgical History:  Past Surgical History:  Procedure Laterality Date   BREAST LUMPECTOMY  2006   right lumpectomy   CATARACT EXTRACTION W/ INTRAOCULAR LENS IMPLANT Bilateral    COLONOSCOPY     CYSTOCELE REPAIR N/A 04/20/2016   Procedure: AUGMENTED ANTERIOR VAULT REPAIR, COLOPLAST DERMIS GRAFT KELLY PLICATION SACROSPINOUS FIXATION;  Surgeon: Carolan Clines, MD;  Location: [facility];  Service: Urology;  Laterality: N/A;   DILATION AND CURETTAGE OF UTERUS     ESOPHAGOGASTRODUODENOSCOPY ENDOSCOPY     PUBOVAGINAL SLING N/A 04/20/2016   Procedure: PUBO-VAGINAL [name];  Surgeon: Carolan Clines, MD;  Location: [facility];  Service: Urology;  Laterality: N/A;   REVISION URINARY SLING N/A 06/12/2016   Procedure: INCISION OF URETHRAL SLING;  Surgeon: Carolan Clines, MD;  Location: [facility];  Service:  Urology;  Laterality: N/A;  1 HOUR   SHOULDER OPEN ROTATOR CUFF REPAIR Right 02/28/2013   Procedure: RIGHT ROTATOR CUFF REPAIR SHOULDER OPEN WITH GRAFT AND ANCHORS;  Surgeon: Tobi Bastos, MD;  Location: [facility];  Service: Orthopedics;  Laterality: Right;   UMBILICAL HERNIA REPAIR     HPI:  Natalie West is an 88 y.o. female who presented to ED from home after episode of hypoxia; has been coughing with PO intake for several days. Dx CHF, worsening asp pna. PMHx significant for esophageal reflux and stricture, last dilatation/EGD 12/30/16, PAF, recently diagnosed RLL PE and acute DVT on Eliquis, HTN, breast cancer, anxiety/depression, GERD, moderate protein calorie malnutrition. Bedbound at baseline; caregiver support    Assessment / Plan / Recommendation  Clinical Impression  Pt alert and participatory, mildly confused.  Oral motor exam was WNL. Dentures present.  She demonstrated adequate oral attention, thorough mastication of solids.  Thin liquids elicited immediate and consistent coughing, concerning for aspiration. She did not cough with nectar thick liquids. Recommend starting a dysphagia 3 diet with nectar thick liquids. SLP will follow while admitted and plan for MBS this coming week. SLP Visit Diagnosis: Dysphagia, oropharyngeal phase (R13.12)    Aspiration Risk  Mild aspiration risk    Diet Recommendation   Dysphagia 3, nectar-thick liquids  Medication Administration: Whole meds with puree    Other  Recommendations  Oral Care Recommendations: Oral care BID Other Recommendations: Order thickener from pharmacy    Recommendations for follow up therapy are one component of a multi-disciplinary discharge planning process, led by the attending physician.  Recommendations may be updated based on patient status, additional functional criteria and insurance authorization.  Follow up Recommendations Other (comment) (tba)      Assistance Recommended at Discharge Frequent or constant  Supervision/Assistance  Functional Status Assessment Patient has had a recent decline in their functional status and demonstrates the ability to make significant improvements in function in a reasonable and predictable amount of time.  Frequency and Duration min 2x/week  2 weeks       Prognosis Prognosis for Safe Diet Advancement: Good      Swallow Study   General Date of Onset: 05/02/22 HPI: Natalie West is an 88 y.o. female who presented to ED from home after episode of hypoxia; has been coughing with PO intake for several days. Dx CHF, worsening asp pna. PMHx significant for esophageal reflux and stricture, last dilatation/EGD 12/30/16, PAF, recently diagnosed RLL PE and acute DVT on Eliquis, HTN, breast cancer, anxiety/depression, GERD, moderate protein calorie malnutrition. Bedbound at baseline; caregiver support Type of Study: Bedside Swallow Evaluation Previous Swallow Assessment: no Diet Prior to this Study: NPO Temperature Spikes Noted: No Respiratory Status: Nasal cannula History of Recent Intubation: No Behavior/Cognition: Alert;Cooperative;Confused Oral Cavity Assessment: Within Functional Limits Oral Care Completed by SLP: Recent completion by staff Oral Cavity - Dentition: Dentures, top;Dentures, bottom Vision: Functional for self-feeding Self-Feeding Abilities: Needs assist Patient Positioning: Upright in bed Baseline Vocal Quality: Normal Volitional Cough: Strong Volitional Swallow: Able to elicit    Oral/Motor/Sensory Function Overall Oral Motor/Sensory Function: Within functional limits   Ice Chips Ice chips: Within functional limits   Thin Liquid Thin Liquid: Impaired Presentation: Cup Pharyngeal  Phase Impairments: Cough - Immediate    Nectar Thick Nectar Thick Liquid: Within functional limits   Honey Thick Honey Thick Liquid: Not tested   Puree Puree: Within functional limits   Solid     Solid: Within functional limits      Juan Quam  Laurice 05/03/2022,9:55 AM  Estill Bamberg L. Tivis Ringer, MA CCC/SLP Clinical Specialist - [place] Office number [phone]

## 2022-05-03 NOTE — Assessment & Plan Note (Signed)
Continue with nutrition recommendations. Swallow dysfunction, continue with dysphagia 3 diet and aspiration precautions per speech therapy recommendations.

## 2022-05-03 NOTE — Assessment & Plan Note (Addendum)
Atrial fibrillation with RVR Patient received digoxin on admission with improvement in heart rate. Rhythm continue in atrial fibrillation.   Telemetry personally reviewed, rhythm atrial fibrillation with rate 80's.   Continue rate control with metoprolol and anticoagulation with apixaban. Patient is not ambulatory with poor physical functional capacity.

## 2022-05-03 NOTE — Assessment & Plan Note (Signed)
Patient hypotensive on admission. She has been tolerating well metoprolol and diuresis with furosemide.  May benefit from ARB.

## 2022-05-03 NOTE — Assessment & Plan Note (Addendum)
Echocardiogram with preserved LV systolic function EF 55 to 60%, mild asymmetric hypertrophy, RV systolic function is preserved. Mild to moderate aortic valve regurgitation.   Urine output 1,350 ml with improvement in her symptoms. Blood pressure systolic is 761 to 848 mmHg   Plan to add SGLT2 inh Continue with metoprolol for rate control atrial fibrillation.  If blood pressure tolerates will benefit from starting ARB.  Continue low dose furosemide

## 2022-05-03 NOTE — Assessment & Plan Note (Addendum)
Swallow dysfunction and aspiration pneumonia. (presnet on admission). Swallow evaluation performed with recommendations for dysphagia 3 diet.   Continue antibiotic therapy with Augmentin for a total of 5 days, for aspiration pneumonia that was diagnosed as outpatient before admission.  Continue with aspiration precautions.

## 2022-05-03 NOTE — Assessment & Plan Note (Addendum)
Continue with amitriptyline, sertraline, and mirtazapine.

## 2022-05-04 ENCOUNTER — Inpatient Hospital Stay (HOSPITAL_COMMUNITY): Payer: Medicare HMO

## 2022-05-04 DIAGNOSIS — I10 Essential (primary) hypertension: Secondary | ICD-10-CM | POA: Diagnosis not present

## 2022-05-04 DIAGNOSIS — I5033 Acute on chronic diastolic (congestive) heart failure: Secondary | ICD-10-CM | POA: Diagnosis not present

## 2022-05-04 DIAGNOSIS — I482 Chronic atrial fibrillation, unspecified: Secondary | ICD-10-CM | POA: Diagnosis not present

## 2022-05-04 DIAGNOSIS — E876 Hypokalemia: Secondary | ICD-10-CM | POA: Diagnosis not present

## 2022-05-04 DIAGNOSIS — E440 Moderate protein-calorie malnutrition: Secondary | ICD-10-CM | POA: Diagnosis not present

## 2022-05-04 DIAGNOSIS — L8990 Pressure ulcer of unspecified site, unspecified stage: Secondary | ICD-10-CM | POA: Diagnosis present

## 2022-05-04 HISTORY — DX: Hypokalemia: E87.6

## 2022-05-04 HISTORY — DX: Pressure ulcer of unspecified site, unspecified stage: L89.90

## 2022-05-04 LAB — GLUCOSE, CAPILLARY
Glucose-Capillary: 105 mg/dL — ABNORMAL HIGH (ref 70–99)
Glucose-Capillary: 115 mg/dL — ABNORMAL HIGH (ref 70–99)

## 2022-05-04 LAB — BASIC METABOLIC PANEL
Anion gap: 5 (ref 5–15)
BUN: 18 mg/dL (ref 8–23)
CO2: 21 mmol/L — ABNORMAL LOW (ref 22–32)
Calcium: 7.8 mg/dL — ABNORMAL LOW (ref 8.9–10.3)
Chloride: 113 mmol/L — ABNORMAL HIGH (ref 98–111)
Creatinine, Ser: 0.65 mg/dL (ref 0.44–1.00)
GFR, Estimated: >60 mL/min (ref >60)
Glucose, Bld: 93 mg/dL (ref 70–99)
Potassium: 3.3 mmol/L — ABNORMAL LOW (ref 3.5–5.1)
Sodium: 139 mmol/L (ref 135–145)

## 2022-05-04 LAB — CBC
HCT: 31.9 % — ABNORMAL LOW (ref 36.0–46.0)
Hemoglobin: 10.2 g/dL — ABNORMAL LOW (ref 12.0–15.0)
MCH: 33.2 pg (ref 26.0–34.0)
MCHC: 32 g/dL (ref 30.0–36.0)
MCV: 103.9 fL — ABNORMAL HIGH (ref 80.0–100.0)
Platelets: 183 10*3/uL (ref 150–400)
RBC: 3.07 MIL/uL — ABNORMAL LOW (ref 3.87–5.11)
RDW: 15.2 % (ref 11.5–15.5)
WBC: 10.9 10*3/uL — ABNORMAL HIGH (ref 4.0–10.5)
nRBC: 0 % (ref 0.0–0.2)

## 2022-05-04 MED ORDER — POTASSIUM CHLORIDE CRYS ER 20 MEQ PO TBCR
40.0000 meq | EXTENDED_RELEASE_TABLET | ORAL | Status: AC
  Administered 2022-05-04 (×2): 40 meq via ORAL
  Filled 2022-05-04 (×2): qty 2

## 2022-05-04 NOTE — Assessment & Plan Note (Signed)
Renal function with serum cr at 0.65, K is 3,3 and serum bicarbonate at 21.  Plan to add 40 meq kcl x2 and follow up renal function in am. Continue holding diuretic therapy.  Avoid hypotension and nephrotoxic medications.

## 2022-05-04 NOTE — Assessment & Plan Note (Signed)
Present on admission   Active Pressure Injury/Wound(s)     Pressure Ulcer  Duration          Pressure Injury 04/13/22 Sacrum Medial Stage 2 -  Partial thickness loss of dermis presenting as a shallow open injury with a red, pink wound bed without slough. 20 days   Pressure Injury 04/13/22 Vertebral column Medial Stage 2 -  Partial thickness loss of dermis presenting as a shallow open injury with a red, pink wound bed without slough. 20 days

## 2022-05-04 NOTE — Evaluation (Signed)
Occupational Therapy Evaluation Patient Details Name: Natalie West MRN: [id number] DOB: 05-May-1931 Today's Date: 05/04/2022   History of Present Illness 88 y.o. female presents to [facility] hospital on 05/02/2022 with cough and dysphagia. Pt admitted for management of CHF and aspiration PNA. PMH: recurrent falls, GERD, Breast CA s/p lumpectomy chemo/rads in remission, HTN, HLD, a.fib (not anticoagulated), CHF, and depression.   Clinical Impression   Pt poor historian and providing inconsistent information for home set up and support; pt reporting she has several aids who come and help her with ADLs. Pt currently requiring Max A for bathing, dressing, and toileting and Total A for bed mobility. Pt with fear of falling and demonstrating BLE extension at EOB. Pt would benefit from further acute OT to facilitate safe dc. Recommend dc to SNF for further OT to optimize safety, independence with ADLs, and return to PLOF.       Recommendations for follow up therapy are one component of a multi-disciplinary discharge planning process, led by the attending physician.  Recommendations may be updated based on patient status, additional functional criteria and insurance authorization.   Follow Up Recommendations  Skilled nursing-short term rehab (<3 hours/day)    Assistance Recommended at Discharge Frequent or constant Supervision/Assistance  Patient can return home with the following Two people to help with walking and/or transfers;Two people to help with bathing/dressing/bathroom    Functional Status Assessment  Patient has had a recent decline in their functional status and demonstrates the ability to make significant improvements in function in a reasonable and predictable amount of time.  Equipment Recommendations  BSC/3in1;Wheelchair (measurements OT);Wheelchair cushion (measurements OT)    Recommendations for Other Services       Precautions / Restrictions Precautions Precautions: Fall      Mobility  Bed Mobility Overal bed mobility: Needs Assistance Bed Mobility: Supine to Sit, Sit to Supine     Supine to sit: Total assist Sit to supine: Total assist   General bed mobility comments: Total A for sitting EOB and then bringing BLEs back over to supine.    Transfers                   General transfer comment: Unable to attempt standing as pt maitaining extension of BLEs at EOB      Balance Overall balance assessment: Needs assistance Sitting-balance support: No upper extremity supported, Feet supported Sitting balance-Leahy Scale: Poor                                     ADL either performed or assessed with clinical judgement   ADL Overall ADL's : Needs assistance/impaired                                       General ADL Comments: Total A for bathing, dressing, and toileting     Vision         Perception     Praxis      Pertinent Vitals/Pain Pain Assessment Pain Assessment: Faces Faces Pain Scale: Hurts little more Pain Location: R knee Pain Descriptors / Indicators: Aching Pain Intervention(s): Monitored during session, Repositioned, Limited activity within patient's tolerance     Hand Dominance Right   Extremity/Trunk Assessment Upper Extremity Assessment Upper Extremity Assessment: Generalized weakness   Lower Extremity Assessment Lower Extremity Assessment: Defer to PT  evaluation   Cervical / Trunk Assessment Cervical / Trunk Assessment: Kyphotic   Communication Communication Communication: HOH   Cognition Arousal/Alertness: Awake/alert Behavior During Therapy: WFL for tasks assessed/performed Overall Cognitive Status: No family/caregiver present to determine baseline cognitive functioning Area of Impairment: Orientation, Following commands, Awareness, Memory, Problem solving                 Orientation Level: Disoriented to, Time, Place, Situation   Memory: Decreased short-term  memory Following Commands: Follows one step commands with increased time, Follows one step commands inconsistently   Awareness: Intellectual Problem Solving: Requires verbal cues, Requires tactile cues, Slow processing, Decreased initiation General Comments: Pt providing inconsisent information for home and support. Following simple commands. difficulty following cues to bend knees once at EOB. Despite tactile cues - pt maintaing extension. Very pleasant throughout     General Comments  VSS on RA    Exercises     Shoulder Instructions      Home Living Family/patient expects to be discharged to:: Skilled nursing facility   Available Help at Discharge: Family;Personal care attendant;Available PRN/intermittently                             Additional Comments: Pt is poor historian and and providing inconsistent information      Prior Functioning/Environment Prior Level of Function : Needs assist           ADLs (physical): Bathing;Dressing;IADLs Mobility Comments: Reports she walks with cane and then stating she hasnt walked in awhile. ADLs Comments: Reports her aids assist with bathing/dressing        OT Problem List: Decreased strength;Decreased range of motion;Impaired balance (sitting and/or standing);Decreased activity tolerance;Decreased knowledge of use of DME or AE;Decreased knowledge of precautions      OT Treatment/Interventions: Self-care/ADL training;Therapeutic exercise;Energy conservation;DME and/or AE instruction;Therapeutic activities;Patient/family education    OT Goals(Current goals can be found in the care plan section) Acute Rehab OT Goals Patient Stated Goal: Get stronger OT Goal Formulation: With patient Time For Goal Achievement: 05/18/22 Potential to Achieve Goals: Fair  OT Frequency: Min 2X/week    Co-evaluation              AM-PAC OT "6 Clicks" Daily Activity     Outcome Measure Help from another person eating meals?: A  Lot Help from another person taking care of personal grooming?: A Lot Help from another person toileting, which includes using toliet, bedpan, or urinal?: A Lot Help from another person bathing (including washing, rinsing, drying)?: A Lot Help from another person to put on and taking off regular upper body clothing?: A Lot Help from another person to put on and taking off regular lower body clothing?: Total 6 Click Score: 11   End of Session Nurse Communication: Mobility status;Other (comment) (bed soiled)  Activity Tolerance: Other (comment);Patient tolerated treatment well (Limited by cognition) Patient left: in bed;with call bell/phone within reach;with bed alarm set  OT Visit Diagnosis: Unsteadiness on feet (R26.81);Other abnormalities of gait and mobility (R26.89);Muscle weakness (generalized) (M62.81)                Time: 6237-6283 OT Time Calculation (min): 21 min Charges:  OT General Charges $OT Visit: 1 Visit OT Evaluation $OT Eval Moderate Complexity: 1 Mod  Antoino Westhoff MSOT, OTR/L Acute Rehab Office: [place] 05/04/2022, 1:00 PM

## 2022-05-04 NOTE — Progress Notes (Signed)
Modified Barium Swallow Progress Note  Patient Details  Name: Natalie West MRN: [id number] Date of Birth: 1931/01/18  Today's Date: 05/04/2022  Modified Barium Swallow completed.  Full report located under Chart Review in the Imaging Section.  Brief recommendations include the following:  Clinical Impression  Pt presented with quite functional swallowing. Positioning and stature created minor difficulties with visibility, but the larynx and upper airway were discernible.  Deficits were related to reduced bolus cohesion, prolonged oral preparation. She demonstrated no difficulty with laryngeal vestibule closure - there was no penetration, no aspiration of thin liquids.  No coughing during the study.  There was a strong pharyngeal stripping wave and no residue post swallow.  She may be having more difficulty protecting the airway when she is eating in bed; or aspiration may be post-prandial - she does have a hx of stricture requiring EGD in 2018.  F  or now, continue dysphagia 3; try thin liquids at bedside. If there is coughing, thicken to nectar. Position as upright as possible or have meals in recliner if able.  SLP will follow for education/safety. [name] will benefit from [facility] SLP.    Swallow Evaluation Recommendations       SLP Diet Recommendations: Dysphagia 3 (Mech soft) solids;Thin liquid;Nectar thick liquid   Liquid Administration via: Cup;Straw   Medication Administration: Whole meds with puree   Supervision: Full assist for feeding   Compensations: Minimize environmental distractions   Postural Changes: Seated upright at 90 degrees   Oral Care Recommendations: Oral care BID      [name] L. Tivis Ringer, MA CCC/SLP Clinical Specialist - Acute Care SLP Acute Rehabilitation Services Office number [phone]   Juan Quam Laurice 05/04/2022,12:59 PM

## 2022-05-04 NOTE — Progress Notes (Signed)
Heart Failure Navigator Progress Note  Assessed for Heart & Vascular TOC clinic readiness.  Patient does not meet criteria due to not acute CHF.Earnestine Leys, BSN, RN Heart Failure Transport planner Only

## 2022-05-04 NOTE — Progress Notes (Addendum)
Progress Note   Patient: Natalie West ZOX:[id number] DOB: 02-Nov-1931 DOA: 05/02/2022     2 DOS: the patient was seen and examined on 05/04/2022   Brief hospital course: Natalie West was admitted to the hospital with the working diagnosis of decompensated heart failure.   88 yo female with the past medical history of paroxysmal atrial fibrillation, hypertension, breast cancer and malnutrition who presented with palpitations and dyspnea. She had a recent diagnosis of pulmonary embolism and deep vein thrombosis. As outpatient she has been on antibiotic therapy for pneumonia, on the day of hospitalization her 02 was 87% on room air, prompting her transfer to the hospital. On her initial physical examination her blood pressure was 92/61, 101/63, HR 119 to 121, RR 32 and 02 saturation 98% on supplemental 02 per [place]. Lungs with bilateral rales and increase work of breathing, heart with S1 and S2 present, irregularly irregular, abdomen not distended, no lower extremity edema.   Na 140, K 3,4 Cl 112 bicarbonate 19, glucose 103 bun 18 cr 0,64 Wbc 17,2 hgb 11,7 plt 209   Chest radiograph with right basal scaring.  CT chest with bilateral ground glass opacities. Interlobular septal thickening.   EKG 127 bpm, left axis deviation, left anterior fascicular block, atrial fibrillation rhythm, no significant ST segment or T wave changes.   Patient received furosemide for diuresis and rate control with AV agents, including digoxin.  Her volume status and heart rate have improved. Patient on monotherapy with metoprolol and anticoagulation with apixaban.   Assessment and Plan: * Acute on chronic diastolic CHF (congestive heart failure) (HCC) Echocardiogram with preserved LV systolic function EF 55 to 60%, mild asymmetric hypertrophy, RV systolic function is preserved. Mild to moderate aortic valve regurgitation.   Furosemide has been held, patient is clinically euvolemic. Continue with metoprolol for rate  control. Holding RAS inhibition due to risk of hypotension.  Acute hypoxemic respiratory failure due to volume overload.   02 saturation is 99% on 2 l/min per [place], plan to wean as tolerated. Patient may need home 02.   Atrial fibrillation, chronic (HCC) Atrial fibrillation with RVR Patient received digoxin on admission with improvement in heart rate. Rhythm continue in atrial fibrillation.   Telemetry personally reviewed, rhythm atrial fibrillation with rate 80's.   Continue rate control with metoprolol and anticoagulation with apixaban. Patient is not ambulatory with poor physical functional capacity.   Essential hypertension Continue close blood pressure monitoring, she was hypotensive on admission.   Protein-calorie malnutrition, moderate ([place]) Consult nutrition for recommendations.   Depression Continue with amitriptyline, sertraline, and mirtazapine.   Dysphagia Swallow dysfunction and aspiration pneumonia. (presnet on admission). Swallow evaluation performed with recommendations for dysphagia 3 diet.   Continue antibiotic therapy with Augmentin for a total of 5 days, for aspiration pneumonia that was diagnosed as outpatient before admission.  Continue with aspiration precautions.   Decubitus skin ulcer Present on admission   Active Pressure Injury/Wound(s)     Pressure Ulcer  Duration          Pressure Injury 04/13/22 Sacrum Medial Stage 2 -  Partial thickness loss of dermis presenting as a shallow open injury with a red, pink wound bed without slough. 20 days   Pressure Injury 04/13/22 Vertebral column Medial Stage 2 -  Partial thickness loss of dermis presenting as a shallow open injury with a red, pink wound bed without slough. 20 days             Hypokalemia Renal function with serum  cr at 0.65, K is 3,3 and serum bicarbonate at 21.  Plan to add 40 meq kcl x2 and follow up renal function in am. Continue holding diuretic therapy.  Avoid hypotension and  nephrotoxic medications.        Subjective: Patient is feeling better, dyspnea is improving, no chest pain., tolerating po well.   Physical Exam: Vitals:   05/03/22 1959 05/03/22 2031 05/04/22 0600 05/04/22 0900  BP: 100/66  100/63 115/66  Pulse: (!) 104  84 94  Resp: '20  20 20  '$ Temp: 98.8 F (37.1 C)  97.7 F (36.5 C) 98.1 F (36.7 C)  TempSrc: Oral  Axillary Oral  SpO2: 95% 99% 99% 99%  Weight:   56.1 kg   Height:       Neurology awake and alert, mild confusion but not agitation  ENT with mild pallor Cardiovascular with S1 and S2 present, irregularly irregular with no gallops, rubs or murmurs No JVD No lower extremity edema Respiratory with no rales or wheezing Abdomen not distended Data Reviewed:    Family Communication: I spoke over the phone with the patient's newphew about patient's  condition, plan of care, prognosis and all questions were addressed.   Disposition: Status is: Inpatient Remains inpatient appropriate because: atrial fibrillation, possible discharge home tomorrow with home health services.   Planned Discharge Destination: Home    Author: Tawni Millers, MD 05/04/2022 11:29 AM  For on call review [email or web address].

## 2022-05-04 NOTE — TOC Initial Note (Signed)
Transition of Care [facility]) - Initial/Assessment Note    Patient Details  Name: Natalie West MRN: [id number] Date of Birth: 03/07/1931  Transition of [facility]) CM/SW Contact:    Bethann Berkshire, LCSW Phone Number: 05/04/2022, 10:27 AM  Clinical Narrative:             CSW called [name] who states she is pt's Niece to Discuss SNF recommendation. [name] explains that pt has 24/7 caregivers with [facility] and receives [facility] with [facility]. Niece would like pt to return home with current services. She states that pt has a son who lives next door who is unable to care for pt. [name] and [employer] live in [place] area. Pt was recently at [facility] in May 2023 and was Dc'd hom eon April 02, 2022. PT recommending [employer]chair and [employer] which niece would like to have delivered to pt's home. She also says pt would need a walker. Pt will need non emergency ambulance transport at DC. [name] will be able to notify [facility] to arrange caregivers once she knows when pt will DC.    Expected Discharge Plan: [name]: Continued Medical Work up   Patient Goals and CMS Choice Patient states their goals for this hospitalization and ongoing recovery are:: Family wants pt to return home      Expected Discharge Plan and Services Expected Discharge Plan: Home w [place]                                              Prior Living Arrangements/Services                       Activities of Daily Living      Permission Sought/Granted                  Emotional Assessment              Admission diagnosis:  CHF (congestive heart failure) ([place]) [I50.9] Hypoxia [R09.02] Aspiration of liquid, initial encounter [T17.998A] Patient Active Problem List   Diagnosis Date Noted   Atrial fibrillation, chronic ([place]) 05/03/2022   Essential hypertension 05/03/2022   Protein-calorie malnutrition, moderate ([place])  05/03/2022   Depression 05/03/2022   Dysphagia 05/03/2022   Acute on chronic diastolic CHF (congestive heart failure) ([place]) 05/02/2022   Pressure injury of skin 04/14/2022   Pulmonary emboli ([place]) 04/13/2022   Acute pulmonary embolism ([place]) 04/12/2022   Cervical spine fracture ([place]) 23/53/6144   Acute diastolic heart failure ([place]) 05/07/2021   Mallet finger of left finger(s) 10/08/2016   Recurrent urinary tract infection 01/20/2015   Cystocele 01/20/2015   Constipation 08/22/2014   Rotator cuff (capsule) sprain 02/28/2013   Breast cancer of lower-outer quadrant of right female breast [facility])    Internal hemorrhoids 06/28/2011   Gastroenteritis 06/28/2011   Hyperlipidemia 02/18/2008   GERD 02/18/2008   HIATAL HERNIA 02/18/2008   OSTEOARTHRITIS 02/18/2008   COLONIC POLYPS, ADENOMATOUS 02/09/2007   DIVERTICULITIS, ACUTE 02/09/2007   ESOPHAGEAL STRICTURE 11/19/1998   PCP:  Christain [place], MD Pharmacy:   [id number] [place], [place] - [address] [place] [address]Phone: [phone]: [phone]    Social Determinants of  Health (SDOH) Interventions    Readmission Risk Interventions    04/16/2022   12:20 PM  Readmission Risk Prevention Plan  Transportation Screening Complete  PCP or Specialist Appt within 5-7 Days Complete  Home Care Screening Complete  Medication Review (RN CM) Complete

## 2022-05-04 NOTE — Progress Notes (Signed)
Speech Language Pathology Treatment: Dysphagia  Patient Details Name: Natalie West MRN: [id number] DOB: [birth year] Today's Date: 05/04/2022 Time: 1093-2355 SLP Time Calculation (min) (ACUTE ONLY): 11 min  Assessment / Plan / Recommendation Clinical Impression  Ms. Natalie West is pleasant and interactive; she continues to cough consistently when drinking thin liquids.  Nectar liquids continue to offer relief from coughing and she reports improved comfort. Recommend proceeding with MBS today to determine physiology of swallow. Pt agrees.  Scheduled for noon today.  HPI HPI: Natalie West is an 88 y.o. female who presented to ED from home after episode of hypoxia; has been coughing with PO intake for several days. Dx CHF, worsening asp pna. PMHx significant for esophageal reflux and stricture, last dilatation/EGD 12/30/16, PAF, recently diagnosed RLL PE and acute DVT on Eliquis, HTN, breast cancer, anxiety/depression, GERD, moderate protein calorie malnutrition. Bedbound at baseline; caregiver support      SLP Plan  Continue with current plan of care      Recommendations for follow up therapy are one component of a multi-disciplinary discharge planning process, led by the attending physician.  Recommendations may be updated based on patient status, additional functional criteria and insurance authorization.    Recommendations  Diet recommendations: Dysphagia 3 (mechanical soft);Nectar-thick liquid Liquids provided via: Cup;Straw Medication Administration: Whole meds with puree Supervision: Patient able to self feed Compensations: Minimize environmental distractions                Oral Care Recommendations: Oral care BID Follow Up Recommendations: Other (comment) (tba) SLP Visit Diagnosis: Dysphagia, oropharyngeal phase (R13.12) Plan: Continue with current plan of care         Natalie Sautter L. West Ringer, MA CCC/SLP Clinical Specialist - Acute Care SLP Acute Rehabilitation Services Office number  [phone]   Natalie West  05/04/2022, 9:28 AM

## 2022-05-04 NOTE — Progress Notes (Signed)
   05/03/22 1959  Assess: MEWS Score  Temp 98.8 F (37.1 C)  BP 100/66  Pulse Rate (!) 104  Resp 20  SpO2 95 %  O2 Device Nasal Cannula  O2 Flow Rate (L/min) 2 L/min  Assess: MEWS Score  MEWS Temp 0  MEWS Systolic 1  MEWS Pulse 1  MEWS RR 0  MEWS LOC 0  MEWS Score 2  MEWS Score Color Yellow  Assess: if the MEWS score is Yellow or Red  Were vital signs taken at a resting state? Yes  Focused Assessment No change from prior assessment  Does the patient meet 2 or more of the SIRS criteria? No  MEWS guidelines implemented *See Row Information* No, vital signs rechecked  Document  Patient Outcome Stabilized after interventions  Progress note created (see row info) Yes  Assess: SIRS CRITERIA  SIRS Temperature  0  SIRS Pulse 1  SIRS Respirations  0  SIRS WBC 0  SIRS Score Sum  1

## 2022-05-04 NOTE — TOC CM/SW Note (Signed)
    Durable Medical Equipment  (From admission, onward)           Start     Ordered   05/04/22 1029  For home use only DME Other see comment  Once       Comments: Harrel Lemon  Question:  Length of Need  Answer:  Lifetime   05/04/22 1028   05/04/22 1027  For home use only DME standard manual wheelchair with seat cushion  Once       Comments: Patient suffers from Acute on chronic diastolic CHF  which impairs their ability to perform daily activities like ambulating  in the home.  A cane  will not resolve issue with performing activities of daily living. A wheelchair will allow patient to safely perform daily activities. Patient can safely propel the wheelchair in the home or has a caregiver who can provide assistance. Length of need lifetime. Accessories: elevating leg rests (ELRs), wheel locks, extensions and anti-tippers.  Seat and back cushions   05/04/22 1028   05/04/22 1026  For home use only DME Walker rolling  Once       Question Answer Comment  Walker: With [place]   Patient needs a walker to treat with the following condition Weakness      05/04/22 1028

## 2022-05-04 NOTE — TOC Initial Note (Addendum)
Transition of Care [facility]) - Initial/Assessment Note    Patient Details  Name: Natalie West MRN: [id number] Date of Birth: 07/04/1931  Transition of Care [facility]) CM/SW Contact:    Marilu Favre, RN Phone Number: 05/04/2022, 11:17 AM  Clinical Narrative:                 Spoke to patient's niece [name] via phone. Family prefers patient to go home at discharge to address listed on face sheet. Patient has 24/7 caregivers through [place]. Anticipated discharge date is tomorrow. [name] will call Mickel Crow today to schedule caregivers.   Confirmed [name] does want wheelchair , hoyer lift and walker. Ordered through [employer]. [name] is contact person for delivery.   Patient active with [facility] . Jenn with [facility] aware anticipated discharge date is tomorrow. WIll need MD signed orders.   Patient will need PTAR transport home tomorrow   [name] not sure if they want palliative care consult at this time. She will discuss with her husband and let NCM know. [name] aware if they decide not to have palliative care consult at this time PCP can arrange   Expected Discharge Plan: [place] Barriers to Discharge: Continued Medical Work up   Patient Goals and CMS Choice Patient states their goals for this hospitalization and ongoing recovery are:: Family wants pt to return home      Expected Discharge Plan and Services Expected Discharge Plan: [place]   Discharge Planning Services: CM Consult   Living arrangements for the past 2 months: Single Family Home                 DME Arranged: Walker rolling, Wheelchair manual, Other see comment Product manager) DME Agency: [employer] Date DME Agency Contacted: 05/04/22 Time DME Agency Contacted: 82 Representative spoke with at DME Agency: Letta Kocher left message HH Arranged: OT, PT, RN [place] Agency: [facility] Date [place]: 05/04/22 Time [place]: 1117 Representative spoke with at [place]: Danise Mina  Prior Living Arrangements/Services Living arrangements for the past 2 months: [place] with:: Other (Comment) (aides through [place])              Current home services: DME    Activities of Daily Living      Permission Sought/Granted   Permission granted to share information with : Yes, Verbal Permission Granted  Share Information with NAME: [name]           Emotional Assessment              Admission diagnosis:  CHF (congestive heart failure) ([place]) [I50.9] Hypoxia [R09.02] Aspiration of liquid, initial encounter [T17.998A] Patient Active Problem List   Diagnosis Date Noted   Atrial fibrillation, chronic ([place]) 05/03/2022   Essential hypertension 05/03/2022   Protein-calorie malnutrition, moderate ([place]) 05/03/2022   Depression 05/03/2022   Dysphagia 05/03/2022   Acute on chronic diastolic CHF (congestive heart failure) ([place]) 05/02/2022   Pressure injury of skin 04/14/2022   Pulmonary emboli ([place]) 04/13/2022   Acute pulmonary embolism ([place]) 04/12/2022   Cervical spine fracture ([place]) 97/35/3299   Acute diastolic heart failure ([place]) 05/07/2021   Mallet finger of left finger(s) 10/08/2016   Recurrent urinary tract infection 01/20/2015   Cystocele 01/20/2015   Constipation 08/22/2014   Rotator cuff (capsule) sprain 02/28/2013   Breast cancer of lower-outer quadrant of right female breast [facility])    Internal hemorrhoids 06/28/2011   Gastroenteritis 06/28/2011  Hyperlipidemia 02/18/2008   GERD 02/18/2008   HIATAL HERNIA 02/18/2008   OSTEOARTHRITIS 02/18/2008   COLONIC POLYPS, ADENOMATOUS 02/09/2007   DIVERTICULITIS, ACUTE 02/09/2007   ESOPHAGEAL STRICTURE 11/19/1998   PCP:  Christain [place], MD Pharmacy:   [id number] [place] [place] - [address] [place]  [id number]: [phone]: [phone]    Social Determinants of Health (SDOH)  Interventions    Readmission Risk Interventions    04/16/2022   12:20 PM  Readmission Risk Prevention Plan  Transportation Screening Complete  PCP or Specialist Appt within 5-7 Days Complete  Home Care Screening Complete  Medication Review (RN CM) Complete

## 2022-05-05 DIAGNOSIS — I5033 Acute on chronic diastolic (congestive) heart failure: Secondary | ICD-10-CM | POA: Diagnosis not present

## 2022-05-05 DIAGNOSIS — I482 Chronic atrial fibrillation, unspecified: Secondary | ICD-10-CM | POA: Diagnosis not present

## 2022-05-05 DIAGNOSIS — E876 Hypokalemia: Secondary | ICD-10-CM

## 2022-05-05 DIAGNOSIS — Z86711 Personal history of pulmonary embolism: Secondary | ICD-10-CM | POA: Diagnosis present

## 2022-05-05 DIAGNOSIS — E440 Moderate protein-calorie malnutrition: Secondary | ICD-10-CM | POA: Diagnosis not present

## 2022-05-05 DIAGNOSIS — I10 Essential (primary) hypertension: Secondary | ICD-10-CM | POA: Diagnosis not present

## 2022-05-05 DIAGNOSIS — I2699 Other pulmonary embolism without acute cor pulmonale: Secondary | ICD-10-CM | POA: Diagnosis present

## 2022-05-05 LAB — CBC
HCT: 33.5 % — ABNORMAL LOW (ref 36.0–46.0)
Hemoglobin: 10.7 g/dL — ABNORMAL LOW (ref 12.0–15.0)
MCH: 33.4 pg (ref 26.0–34.0)
MCHC: 31.9 g/dL (ref 30.0–36.0)
MCV: 104.7 fL — ABNORMAL HIGH (ref 80.0–100.0)
Platelets: 215 10*3/uL (ref 150–400)
RBC: 3.2 MIL/uL — ABNORMAL LOW (ref 3.87–5.11)
RDW: 14.8 % (ref 11.5–15.5)
WBC: 12.8 10*3/uL — ABNORMAL HIGH (ref 4.0–10.5)
nRBC: 0 % (ref 0.0–0.2)

## 2022-05-05 LAB — BASIC METABOLIC PANEL
Anion gap: 9 (ref 5–15)
BUN: 19 mg/dL (ref 8–23)
CO2: 21 mmol/L — ABNORMAL LOW (ref 22–32)
Calcium: 8.4 mg/dL — ABNORMAL LOW (ref 8.9–10.3)
Chloride: 111 mmol/L (ref 98–111)
Creatinine, Ser: 0.72 mg/dL (ref 0.44–1.00)
GFR, Estimated: >60 mL/min (ref >60)
Glucose, Bld: 106 mg/dL — ABNORMAL HIGH (ref 70–99)
Potassium: 4.6 mmol/L (ref 3.5–5.1)
Sodium: 141 mmol/L (ref 135–145)

## 2022-05-05 MED ORDER — FUROSEMIDE 10 MG/ML IJ SOLN
40.0000 mg | Freq: Once | INTRAMUSCULAR | Status: AC
Start: 2022-05-05 — End: 2022-05-05
  Administered 2022-05-05: 40 mg via INTRAVENOUS
  Filled 2022-05-05: qty 4

## 2022-05-05 MED ORDER — FUROSEMIDE 20 MG PO TABS
20.0000 mg | ORAL_TABLET | Freq: Every day | ORAL | Status: DC
  Administered 2022-05-06 – 2022-05-07 (×2): 20 mg via ORAL
  Filled 2022-05-05 (×2): qty 1

## 2022-05-05 NOTE — TOC Progression Note (Signed)
Transition of Care [facility]) - Progression Note    Patient Details  Name: LUCILE HILLMANN MRN: [id number] Date of Birth: [birth year]  Transition of Care [facility]) CM/SW Contact  Pollie Friar, RN Phone Number: 05/05/2022, 12:19 PM  Clinical Narrative:    Per MD not medically ready. TOC will follow for potential d/c home tomorrow.   Expected Discharge Plan: [place] Barriers to Discharge: Continued Medical Work up  Expected Discharge Plan and Services Expected Discharge Plan: [place]   Discharge Planning Services: CM Consult   Living arrangements for the past 2 months: Single Family Home                 DME Arranged: Walker rolling, Wheelchair manual, Other see comment Product manager) DME Agency: [employer] Date DME Agency Contacted: 05/04/22 Time DME Agency Contacted: 23 Representative spoke with at DME Agency: Letta Kocher left message HH Arranged: OT, PT, RN [place] Agency: [facility] Date [place]: 05/04/22 Time [place]: 1117 Representative spoke with at [place]: [place]) Interventions    Readmission Risk Interventions    04/16/2022   12:20 PM  Readmission Risk Prevention Plan  Transportation Screening Complete  PCP or Specialist Appt within 5-7 Days Complete  Home Care Screening Complete  Medication Review (RN CM) Complete

## 2022-05-05 NOTE — Assessment & Plan Note (Signed)
Recent diagnosis of pulmonary embolism, 04/2022.  Positive DVT right common femoral vein and SF junction, acute DVT left common femoral vein, left femoral vein, left proximal profunda and left popliteal vein.   Continue anticoagulation with apixaban.

## 2022-05-05 NOTE — Care Management Important Message (Signed)
Important Message  Patient Details  Name: Natalie West MRN: [id number] Date of Birth: 31-Aug-1931   Medicare Important Message Given:  Yes     Shelda Altes 05/05/2022, 8:42 AM

## 2022-05-05 NOTE — Progress Notes (Signed)
Progress Note   Patient: Natalie West RJJ:[id number] DOB: September 13, 1931 DOA: 05/02/2022     3 DOS: the patient was seen and examined on 05/05/2022   Brief hospital course: Mrs. Arambula was admitted to the hospital with the working diagnosis of decompensated heart failure in the setting of uncontrolled atrial fibrillation.   88 yo female with the past medical history of paroxysmal atrial fibrillation, hypertension, breast cancer and malnutrition who presented with palpitations and dyspnea. She had a recent diagnosis of pulmonary embolism and deep vein thrombosis. As outpatient she has been on antibiotic therapy for pneumonia, on the day of hospitalization her 02 was 87% on room air, prompting her transfer to the hospital. On her initial physical examination her blood pressure was 92/61, 101/63, HR 119 to 121, RR 32 and 02 saturation 98% on supplemental 02 per [place]. Lungs with bilateral rales and increase work of breathing, heart with S1 and S2 present, irregularly irregular, abdomen not distended, no lower extremity edema.   Na 140, K 3,4 Cl 112 bicarbonate 19, glucose 103 bun 18 cr 0,64 Wbc 17,2 hgb 11,7 plt 209   Chest radiograph with right basal scaring.  CT chest with bilateral ground glass opacities. Interlobular septal thickening.   EKG 127 bpm, left axis deviation, left anterior fascicular block, atrial fibrillation rhythm, no significant ST segment or T wave changes.   Patient received furosemide for diuresis and rate control with AV agents, including digoxin.  Her volume status and heart rate have improved. Patient on monotherapy with metoprolol and anticoagulation with apixaban.   07/04 mild volume overload and increase work of breathing, will do one dose of IV furosemide, possible dc home tomorrow if improves her volume status.  Out patient follow up with palliative care.   Assessment and Plan: * Acute on chronic diastolic CHF (congestive heart failure) (HCC) Echocardiogram with preserved LV  systolic function EF 55 to 60%, mild asymmetric hypertrophy, RV systolic function is preserved. Mild to moderate aortic valve regurgitation.   Today with increase work of breathing and signs of mild volume overload.   Plan to add one dose of IV furosemide 40 mg and transition to oral furosemide in am.  Continue with metoprolol for rate control. Holding RAS inhibition due to risk of hypotension.  Acute hypoxemic respiratory failure due to volume overload.   Oxygenation has improved, today is 95% on room air.   Atrial fibrillation, chronic (HCC) Atrial fibrillation with RVR Patient received digoxin on admission with improvement in heart rate. Rhythm continue in atrial fibrillation.   Her heart rate continue to be well controlled with metoprolol tartrate.  On anticoagulation with apixaban.  Patient with poor physical functional status at baseline.   Essential hypertension Continue close blood pressure monitoring, she was hypotensive on admission.   Protein-calorie malnutrition, moderate ([place]) Continue with nutrition recommendations. Swallow dysfunction, continue with dysphagia 3 diet and aspiration precautions per speech therapy recommendations.   Depression Continue with amitriptyline, sertraline, and mirtazapine.   Dysphagia Swallow dysfunction and aspiration pneumonia. (presnet on admission). Swallow evaluation performed with recommendations for dysphagia 3 diet.   Continue antibiotic therapy with Augmentin for a total of 5 days, for aspiration pneumonia that was diagnosed as outpatient before admission.  Continue with aspiration precautions.   Decubitus skin ulcer Present on admission   Active Pressure Injury/Wound(s)     Pressure Ulcer  Duration          Pressure Injury 04/13/22 Sacrum Medial Stage 2 -  Partial thickness loss of dermis  presenting as a shallow open injury with a red, pink wound bed without slough. 20 days   Pressure Injury 04/13/22 Vertebral column  Medial Stage 2 -  Partial thickness loss of dermis presenting as a shallow open injury with a red, pink wound bed without slough. 20 days             Hypokalemia Patient is tolerating po well.  Her renal function today has a serum cr of 0,72 with K at 4,6 and serum bicarbonate at 21. Follow up renal function in am.   Pulmonary embolism (HCC) Recent diagnosis of pulmonary embolism, 04/2022.  Positive DVT right common femoral vein and SF junction, acute DVT left common femoral vein, left femoral vein, left proximal profunda and left popliteal vein.   Continue anticoagulation with apixaban.         Subjective: Patient today with increased work of breathing, no chest pain, no nausea or vomiting.   Physical Exam: Vitals:   05/05/22 0021 05/05/22 0358 05/05/22 0800 05/05/22 1121  BP:  129/86 120/74 118/65  Pulse:  90 (!) 106 88  Resp:  (!) '22 19 19  '$ Temp:  99 F (37.2 C) 98.2 F (36.8 C) 97.7 F (36.5 C)  TempSrc:  Oral Oral Oral  SpO2:  95% 93% 95%  Weight: 56 kg     Height:       Neurology awake and alert ENT with mild pallor Cardiovascular with S1 and S2 present, irregularly irregular with no gallops or rubs, positive systolic murmur at apex Mild JVD Bilateral lower extremity edema + pitting Respiratory with scattered rales on anterior auscultation, no wheezing Abdomen not distended.  Data Reviewed:    Family Communication: no family at the bedside   Disposition: Status is: Inpatient Remains inpatient appropriate because: IV diuresis today, possible dc home tomorrow   Planned Discharge Destination: Home family have declined SNF    Author: Tawni Millers, MD 05/05/2022 12:41 PM  For on call review [email or web address].

## 2022-05-06 DIAGNOSIS — J189 Pneumonia, unspecified organism: Secondary | ICD-10-CM | POA: Diagnosis not present

## 2022-05-06 DIAGNOSIS — I10 Essential (primary) hypertension: Secondary | ICD-10-CM | POA: Diagnosis not present

## 2022-05-06 DIAGNOSIS — I5033 Acute on chronic diastolic (congestive) heart failure: Secondary | ICD-10-CM | POA: Diagnosis not present

## 2022-05-06 DIAGNOSIS — I482 Chronic atrial fibrillation, unspecified: Secondary | ICD-10-CM | POA: Diagnosis not present

## 2022-05-06 LAB — BASIC METABOLIC PANEL
Anion gap: 9 (ref 5–15)
BUN: 17 mg/dL (ref 8–23)
CO2: 24 mmol/L (ref 22–32)
Calcium: 8.5 mg/dL — ABNORMAL LOW (ref 8.9–10.3)
Chloride: 107 mmol/L (ref 98–111)
Creatinine, Ser: 0.8 mg/dL (ref 0.44–1.00)
GFR, Estimated: >60 mL/min (ref >60)
Glucose, Bld: 101 mg/dL — ABNORMAL HIGH (ref 70–99)
Potassium: 4 mmol/L (ref 3.5–5.1)
Sodium: 140 mmol/L (ref 135–145)

## 2022-05-06 LAB — CBC
HCT: 33.8 % — ABNORMAL LOW (ref 36.0–46.0)
Hemoglobin: 11.1 g/dL — ABNORMAL LOW (ref 12.0–15.0)
MCH: 33.4 pg (ref 26.0–34.0)
MCHC: 32.8 g/dL (ref 30.0–36.0)
MCV: 101.8 fL — ABNORMAL HIGH (ref 80.0–100.0)
Platelets: 222 10*3/uL (ref 150–400)
RBC: 3.32 MIL/uL — ABNORMAL LOW (ref 3.87–5.11)
RDW: 14.8 % (ref 11.5–15.5)
WBC: 15 10*3/uL — ABNORMAL HIGH (ref 4.0–10.5)
nRBC: 0 % (ref 0.0–0.2)

## 2022-05-06 MED ORDER — EMPAGLIFLOZIN 10 MG PO TABS
10.0000 mg | ORAL_TABLET | Freq: Every day | ORAL | Status: DC
  Administered 2022-05-06 – 2022-05-07 (×2): 10 mg via ORAL
  Filled 2022-05-06 (×2): qty 1

## 2022-05-06 MED ORDER — CEPHALEXIN 250 MG PO CAPS
250.0000 mg | ORAL_CAPSULE | Freq: Three times a day (TID) | ORAL | Status: DC
  Administered 2022-05-06 – 2022-05-07 (×4): 250 mg via ORAL
  Filled 2022-05-06 (×4): qty 1

## 2022-05-06 MED ORDER — APIXABAN 5 MG PO TABS: 5.0000 mg | ORAL_TABLET | Freq: Two times a day (BID) | ORAL | Status: DC

## 2022-05-06 MED ORDER — POTASSIUM CHLORIDE CRYS ER 10 MEQ PO TBCR
10.0000 meq | EXTENDED_RELEASE_TABLET | Freq: Every day | ORAL | Status: DC
  Administered 2022-05-06 – 2022-05-07 (×2): 10 meq via ORAL
  Filled 2022-05-06 (×2): qty 1

## 2022-05-06 MED ORDER — APIXABAN 5 MG PO TABS
5.0000 mg | ORAL_TABLET | Freq: Two times a day (BID) | ORAL | Status: DC
  Administered 2022-05-06 – 2022-05-07 (×3): 5 mg via ORAL
  Filled 2022-05-06 (×2): qty 1

## 2022-05-06 NOTE — Progress Notes (Addendum)
Progress Note   Patient: Natalie West [id number] DOB: Nov 18, 1930 DOA: 05/02/2022     4 DOS: the patient was seen and examined on 05/06/2022   Brief hospital course: Natalie West was admitted to the hospital with the working diagnosis of decompensated heart failure in the setting of uncontrolled atrial fibrillation.   88 yo female with the past medical history of paroxysmal atrial fibrillation, hypertension, breast cancer and malnutrition who presented with palpitations and dyspnea. She had a recent diagnosis of pulmonary embolism and deep vein thrombosis. As outpatient she has been on antibiotic therapy for pneumonia, on the day of hospitalization her 02 was 87% on room air, prompting her transfer to the hospital. On her initial physical examination her blood pressure was 92/61, 101/63, HR 119 to 121, RR 32 and 02 saturation 98% on supplemental 02 per [place]. Lungs with bilateral rales and increase work of breathing, heart with S1 and S2 present, irregularly irregular, abdomen not distended, no lower extremity edema.   Na 140, K 3,4 Cl 112 bicarbonate 19, glucose 103 bun 18 cr 0,64 Wbc 17,2 hgb 11,7 plt 209   Chest radiograph with right basal scaring.  CT chest with bilateral ground glass opacities. Interlobular septal thickening.   EKG 127 bpm, left axis deviation, left anterior fascicular block, atrial fibrillation rhythm, no significant ST segment or T wave changes.   Patient received furosemide for diuresis and rate control with AV agents, including digoxin.  Her volume status and heart rate have improved. Patient on monotherapy with metoprolol and anticoagulation with apixaban.   07/04 mild volume overload and increase work of breathing, will do one dose of IV furosemide, possible dc home tomorrow if improves her volume status.  Out patient follow up with palliative care.   07/05 persistent leukocytosis, changed antibiotic to cephalexin for urine infection.   Assessment and Plan: * Acute on  chronic diastolic CHF (congestive heart failure) (HCC) Echocardiogram with preserved LV systolic function EF 55 to 60%, mild asymmetric hypertrophy, RV systolic function is preserved. Mild to moderate aortic valve regurgitation.   Urine output 1,350 ml with improvement in her symptoms. Blood pressure systolic is 597 to 416 mmHg   Plan to add SGLT2 inh Continue with metoprolol for rate control atrial fibrillation.  If blood pressure tolerates will benefit from starting ARB.   Atrial fibrillation, chronic (HCC) Atrial fibrillation with RVR Patient received digoxin on admission with improvement in heart rate. Rhythm continue in atrial fibrillation.   Her rate continue to be controlled in the 80 range.   Continue rate control with metoprolol and anticoagulation with apixaban.    Essential hypertension Patient hypotensive on admission. She has been tolerating well metoprolol and diuresis with furosemide.  May benefit from ARB.   Protein-calorie malnutrition, moderate ([place]) Continue with nutrition recommendations. Swallow dysfunction, continue with dysphagia 3 diet and aspiration precautions per speech therapy recommendations.   Depression Continue with amitriptyline, sertraline, and mirtazapine.   CAP (community acquired pneumonia) Pneumonia diagnosed as outpatient and receiving antibiotic therapy with Augmentin.   Swallow dysfunction and aspiration pneumonia. (presnet on admission). Swallow evaluation performed with recommendations for dysphagia 3 diet.   Urine infection present on admission.  Patient with worsening leukocytosis.  Urine positive for pyuria on admission, past culture positive for Klebsiella which was resistant to ampicillin (on 04/2022).  Will change antibiotic therapy to oral cephalosporin with cephalexin and follow up cell count in am.   Decubitus skin ulcer Present on admission   Active Pressure Injury/Wound(s)  Pressure Ulcer  Duration           Pressure Injury 04/13/22 Sacrum Medial Stage 2 -  Partial thickness loss of dermis presenting as a shallow open injury with a red, pink wound bed without slough. 20 days   Pressure Injury 04/13/22 Vertebral column Medial Stage 2 -  Partial thickness loss of dermis presenting as a shallow open injury with a red, pink wound bed without slough. 20 days             Hypokalemia Renal function stable with serum cr at 0,80 with K at 4,0 and serum bicarbonate at 24.  Patient is tolerating po.  Plan to use furosemide as needed.   Pulmonary embolism (HCC) Recent diagnosis of pulmonary embolism, 04/2022.  Positive DVT right common femoral vein and SF junction, acute DVT left common femoral vein, left femoral vein, left proximal profunda and left popliteal vein.   Continue anticoagulation with apixaban.         Subjective: Patient is feeling better, dyspnea has improved, no chest pain, no abdominal pain, no nausea or vomiting.   Physical Exam: Vitals:   05/05/22 1121 05/05/22 1559 05/05/22 1934 05/06/22 0418  BP: 118/65 110/71 116/71 125/76  Pulse: 88  (!) 106 87  Resp: '19 20 20 20  '$ Temp: 97.7 F (36.5 C) 98 F (36.7 C) 97.8 F (36.6 C) 97.9 F (36.6 C)  TempSrc: Oral Oral Oral   SpO2: 95%  92% 95%  Weight:    54.1 kg  Height:       Neurology awake and alert, deconditioned ENT with mild pallor Cardiovascular with S1 and S2 present irregularly irregular with no gallops, rubs or murmurs No JVD No lower extremity edema Respiratory with no rales or wheezing Abdomen not distended  Data Reviewed:    Family Communication: I spoke over the phone with the patient's nephew about patient's  condition, plan of care, prognosis and all questions were addressed.  Disposition: Status is: Inpatient Remains inpatient appropriate because: follow up wbc   Planned Discharge Destination: Home     Author: Tawni Millers, MD 05/06/2022 10:06 AM  For on call review  [email or web address].

## 2022-05-06 NOTE — Therapy (Signed)
Occupational Therapy Treatment Patient Details Name: Natalie West MRN: [id number] DOB: Jun 07, 1931 Today's Date: 05/06/2022   History of present illness 88 y.o. female presents to [facility] hospital on 05/02/2022 with cough and dysphagia. Pt admitted for management of CHF and aspiration PNA. PMH: recurrent falls, GERD, Breast CA s/p lumpectomy chemo/rads in remission, HTN, HLD, a.fib (not anticoagulated), CHF, and depression.   OT comments  88 yo female received supine with HOB elevated. Pt confused and asking for her purse saying she needs to pay for her meals. Difficult to redirect and re-orient. Assisted with opening containers for eating. Pt successfully taking 1-2 bites before asking OT to save the food for later. PT arrived to bedside and completed co-tx for mobility to EOB. Pt very fearful of falling and this restricts her participation in sitting balance. Returned to supine and left with needs met and bed alarm activated. Will continue to follow acutely. If d/c home, strongly recommend hospital bed. Anticipate pt will not be safe to sit in w/c due to preference for knee and hip extension with sitting due to strong fear of falling. She will likely slide out of w/c even if hoyer lift is used for transfer.    Recommendations for follow up therapy are one component of a multi-disciplinary discharge planning process, led by the attending physician.  Recommendations may be updated based on patient status, additional functional criteria and insurance authorization.    Follow Up Recommendations  Skilled nursing-short term rehab (<3 hours/day)    Assistance Recommended at Discharge Frequent or constant Supervision/Assistance  Patient can return home with the following  Two people to help with walking and/or transfers;Two people to help with bathing/dressing/bathroom   Equipment Recommendations  BSC/3in1;Wheelchair (measurements OT);Wheelchair cushion (measurements OT);Hospital bed       Precautions /  Restrictions Precautions Precautions: Fall;Other (comment) (significant fear of falling)       Mobility Bed Mobility         Supine to sit: +2 for physical assistance, HOB elevated, Max assist, Total assist     General bed mobility comments: keeping knees extended and ankles dorsiflexion despite 3 person assist present to prevent fall, pt fearful of falling and unable to relax, see PT note for details of LE assessment           ADL either performed or assessed with clinical judgement   ADL Overall ADL's : Needs assistance/impaired Eating/Feeding: Set up;Bed level (assist for opening containers)                Cognition Arousal/Alertness: Awake/alert Behavior During Therapy: Anxious Overall Cognitive Status: No family/caregiver present to determine baseline cognitive functioning Area of Impairment: Orientation, Following commands, Awareness, Memory, Problem solving                 Orientation Level: Disoriented to, Time, Place, Situation   Memory: Decreased short-term memory Following Commands: Follows one step commands with increased time, Follows one step commands inconsistently   Awareness: Intellectual                       Pertinent Vitals/ Pain       Pain Assessment Pain Assessment: No/denies pain   Frequency  Min 2X/week        Progress Toward Goals  OT Goals(current goals can now be found in the care plan section)  Progress towards OT goals: Not progressing toward goals - comment  Acute Rehab OT Goals Patient Stated Goal: Unable to report on goals  OT Goal Formulation: With patient Time For Goal Achievement: 05/18/22 Potential to Achieve Goals: [place] Discharge plan remains appropriate    Co-evaluation    PT/OT/SLP Co-Evaluation/Treatment: Yes Reason for Co-Treatment: For patient/therapist safety;To address functional/ADL transfers;Necessary to address cognition/behavior during functional activity PT goals addressed  during session: Balance;Strengthening/ROM OT goals addressed during session: Other (comment) (cognition/re-orientation and management of anxiety)      AM-PAC OT "6 Clicks" Daily Activity     Outcome Measure   Help from another person eating meals?: A Little Help from another person taking care of personal grooming?: A Lot Help from another person toileting, which includes using toliet, bedpan, or urinal?: Total Help from another person bathing (including washing, rinsing, drying)?: Total Help from another person to put on and taking off regular upper body clothing?: Total Help from another person to put on and taking off regular lower body clothing?: Total 6 Click Score: 9    End of Session    OT Visit Diagnosis: Unsteadiness on feet (R26.81);Other abnormalities of gait and mobility (R26.89);Muscle weakness (generalized) (M62.81)   Activity Tolerance Patient tolerated treatment well;Other (comment) (limited by fatigue)   Patient Left in bed;with call bell/phone within reach;with bed alarm set           Time: 2902-1115 OT Time Calculation (min): 40 min  Charges: OT General Charges $OT Visit: 1 Visit OT Treatments $Self Care/Home Management : 8-22 mins $Therapeutic Activity: 8-22 mins    Naomie Dean Massimo [place], OTR/L 05/06/2022, 2:19 PM

## 2022-05-06 NOTE — Progress Notes (Signed)
Speech Language Pathology Treatment: Dysphagia  Patient Details Name: Natalie West MRN: [id number] DOB: 04-01-1931 Today's Date: 05/06/2022 Time: 0721-8288 SLP Time Calculation (min) (ACUTE ONLY): 25 min  Assessment / Plan / Recommendation Clinical Impression  Pt set up for am meal, repositioned upright. Pt able to feed herself if containers opened. Pt observed to have some prolonged mastication, given herself a liquid wash but does not fully complete mastication and swallow prior to next bite. There is potential for premature spillage given eating behaviors. However, no coughing occurred today and MBS showed adequate airway protection. Recommend basic precautions, particularly upright positioning. No further SLP assistance needed at this time.   HPI HPI: Natalie West is an 88 y.o. female who presented to ED from home after episode of hypoxia; has been coughing with PO intake for several days. Dx CHF, worsening asp pna. PMHx significant for esophageal reflux and stricture, last dilatation/EGD 12/30/16, PAF, recently diagnosed RLL PE and acute DVT on Eliquis, HTN, breast cancer, anxiety/depression, GERD, moderate protein calorie malnutrition. Bedbound at baseline; caregiver support      SLP Plan  All goals met      Recommendations for follow up therapy are one component of a multi-disciplinary discharge planning process, led by the attending physician.  Recommendations may be updated based on patient status, additional functional criteria and insurance authorization.    Recommendations  Diet recommendations: Dysphagia 3 (mechanical soft);Regular Liquids provided via: Cup;Straw Medication Administration: Whole meds with puree Supervision: Patient able to self feed Compensations: Minimize environmental distractions Postural Changes and/or Swallow Maneuvers: Seated upright 90 degrees                Follow Up Recommendations: No SLP follow up Assistance recommended at discharge: Frequent or  constant Supervision/Assistance SLP Visit Diagnosis: Dysphagia, oropharyngeal phase (R13.12) Plan: All goals met           Natalie West, Katherene Ponto  05/06/2022, 10:03 AM

## 2022-05-06 NOTE — Progress Notes (Signed)
Physical Therapy Treatment Patient Details Name: Natalie West MRN: [id number] DOB: Jul 01, 1931 Today's Date: 05/06/2022   History of Present Illness 88 y.o. female [facility] hospital on [facility]/2022 with cough and dysphagia. Pt admitted for management of CHF and aspiration PNA. PMH: recurrent falls, GERD, Breast CA s/p lumpectomy chemo/rads in remission, HTN, HLD, a.fib (not anticoagulated), CHF, and depression.    PT Comments    Patient remains limited by significant weakness and fear of falling. OT at bedside on PT arrival and co-tx for safety with mobility. Patient required Max/Total Assist +2 to transfer supine<>sit. Pt has significant posterior lean and tactile cue at hip required to reduce trunk extension in sitting. Assist/manual cue provided at bil knee's to encourage knee flexion to steady balance, pt's ROM limited and unable to flex knee past 10* on bil LE. Pt's ankles severely restricted in plantarflexion/inversion as well. Pt has poor prognosis for weight bearing to complete transfers and will required lift transfer for safety. Given severe extension posture/posterior lean at EOB pt is likely going to be able to obtain good/safe posture to sit upright in wheelchair. She will most likely slide out of the chair. If she returns home she will need a hospital bed with a mattress replacement for pressure relief and 24/7 assist/care. Will continue to progress  able.     Recommendations for follow up therapy are one component of a multi-disciplinary discharge planning process, led by the attending physician.  Recommendations may be updated based on patient status, additional functional criteria and insurance authorization.  Follow Up Recommendations  Skilled nursing-short term rehab (<3 hours/day) (family plans to take home (they will need HH PT/OT and 24/7 home aids)) Can patient physically be transported by private vehicle: No   Assistance Recommended at Discharge Frequent or constant  Supervision/Assistance  Patient can return home with the following Two people to help with walking and/or transfers;Two people to help with bathing/dressing/bathroom;Assistance with cooking/housework;Assistance with feeding;Direct supervision/assist for medications management;Direct supervision/assist for financial management;Assist for transportation;Help with stairs or ramp for entrance   Equipment Recommendations  Hospital bed;Other (comment) (hoyer lift - pt will require hospital bed with mattress replacement for pressure distribution)    Recommendations for Other Services       Precautions / Restrictions Precautions Precautions: Fall;Other (comment) (significant fear of falling) Restrictions Weight Bearing Restrictions: No     Mobility  Bed Mobility Overal bed mobility: Needs Assistance Bed Mobility: Supine to Sit, Sit to Supine     Supine to sit: +2 for physical assistance, HOB elevated, Total assist, +2 for safety/equipment Sit to supine: +2 for physical assistance, HOB elevated, Total assist, +2 for safety/equipment   General bed mobility comments: pt attempted to initiate walking bil LE's off EOB and with multimodal cues attempted to initiate reaching UE's to bed rail. Max/Total assist required to complete each part and pivot with bed pad to sit up EOB. Max support at back to prevent posterior lean with tactile cue at hip flexor to facilitate more upright posture. Manual cues and assist to attempt to flex bil knees for feet to floor, pt guarding and unable to fully bend knees. Total assist to return to supine and bed adjusted for slight chair position.    Transfers                   General transfer comment: NT due to safety concerns    Ambulation/Gait  Stairs             Wheelchair Mobility    Modified Rankin (Stroke Patients Only)       Balance Overall balance assessment: Needs assistance Sitting-balance support:  Bilateral upper extremity supported Sitting balance-Leahy Scale: Zero Sitting balance - Comments: heavy posterior lean, bil LE's extended Postural control: Posterior lean                                  Cognition Arousal/Alertness: Awake/alert Behavior During Therapy: Anxious Overall Cognitive Status: No family/caregiver present to determine baseline cognitive functioning Area of Impairment: Orientation, Following commands, Awareness, Memory, Problem solving                 Orientation Level: Disoriented to, Time, Place, Situation   Memory: Decreased short-term memory Following Commands: Follows one step commands with increased time, Follows one step commands inconsistently   Awareness: Intellectual Problem Solving: Difficulty sequencing, Requires verbal cues, Requires tactile cues General Comments: pt has severe fear of falling, anxiety decreased with calming education from PT/OT.        Exercises      General Comments General comments (skin integrity, edema, etc.): Pt has SEVERE limitationg for bil knee ROM. testing at EOB in sitting and in supine, bil knees limited to 0-10*. bil ankles severly limited in plantarflexion/inversion. given contractures unsure of last time pt has been sitting EOB or weight bearing.      Pertinent Vitals/Pain Pain Assessment Pain Assessment: No/denies pain    Home Living                          Prior Function            PT Goals (current goals can now be found in the care plan section) Acute Rehab PT Goals PT Goal Formulation: With patient Time For Goal Achievement: 05/17/22 Potential to Achieve Goals: Poor Progress towards PT goals: Progressing toward goals    Frequency    Min 2X/week      PT Plan Current plan remains appropriate    Co-evaluation   Reason for Co-Treatment: For patient/therapist safety;To address functional/ADL transfers PT goals addressed during session: Mobility/safety with  mobility;Balance;Strengthening/ROM OT goals addressed during session: Other (comment) (cognition/re-orientation and management of anxiety)      AM-PAC PT "6 Clicks" Mobility   Outcome Measure  Help needed turning from your back to your side while in a flat bed without using bedrails?: Total Help needed moving from lying on your back to sitting on the side of a flat bed without using bedrails?: Total Help needed moving to and from a bed to a chair (including a wheelchair)?: Total Help needed standing up from a chair using your arms (e.g., wheelchair or bedside chair)?: Total Help needed to walk in hospital room?: Total Help needed climbing 3-5 steps with a railing? : Total 6 Click Score: 6    End of Session   Activity Tolerance: Patient tolerated treatment well Patient left: in bed;with call bell/phone within reach;with bed alarm set Nurse Communication: Mobility status PT Visit Diagnosis: History of falling (Z91.81);Muscle weakness (generalized) (M62.81)     Time: 8841-6606 PT Time Calculation (min) (ACUTE ONLY): 24 min  Charges:  $Therapeutic Activity: 8-22 mins                     Gwynneth Albright PT, DPT Acute Rehabilitation Services  Office [phone] Pager [phone]  05/06/22 3:24 PM

## 2022-05-06 NOTE — TOC Progression Note (Addendum)
Transition of Care [facility]) - Progression Note    Patient Details  Name: Natalie West MRN: [id number] Date of Birth: 12/19/1930  [facility] CM/SW Contact  Zenon Mayo, RN Phone Number: 05/06/2022, 12:46 PM  Clinical Narrative:    Patient has 24/7 caregivers through [place]. Wheelchair, hoyer lift and walker ordered thru [employer],  [name] states they already have a w/chair and the walker and hoyer lift has already been delivered to the home on Monday.  Patient active with [facility].  Will need PTAR transport home.  TOC will continue to follow for dc needs.    Expected Discharge Plan: [place] Barriers to Discharge: Continued Medical Work up  Expected Discharge Plan and Services Expected Discharge Plan: [place]   Discharge Planning Services: CM Consult   Living arrangements for the past 2 months: Single Family Home                 DME Arranged: Walker rolling, Wheelchair manual, Other see comment Product manager) DME Agency: [employer] Date DME Agency Contacted: 05/04/22 Time DME Agency Contacted: 42 Representative spoke with at DME Agency: Letta Kocher left message HH Arranged: OT, PT, RN Minor Agency: [facility] Date [place]: 05/04/22 Time [place]: [phone] Representative spoke with at [place]: [place] ([place]) Interventions    Readmission Risk Interventions    04/16/2022   12:20 PM  Readmission Risk Prevention Plan  Transportation Screening Complete  PCP or Specialist Appt within 5-7 Days Complete  Home Care Screening Complete  Medication Review (RN CM) Complete

## 2022-05-07 ENCOUNTER — Other Ambulatory Visit: Payer: Self-pay | Admitting: Family Medicine

## 2022-05-07 ENCOUNTER — Other Ambulatory Visit (HOSPITAL_COMMUNITY): Payer: Self-pay

## 2022-05-07 DIAGNOSIS — I5033 Acute on chronic diastolic (congestive) heart failure: Secondary | ICD-10-CM | POA: Diagnosis not present

## 2022-05-07 LAB — CULTURE, BLOOD (ROUTINE X 2)
Culture: NO GROWTH
Culture: NO GROWTH
Special Requests: ADEQUATE

## 2022-05-07 LAB — CBC
HCT: 34.1 % — ABNORMAL LOW (ref 36.0–46.0)
Hemoglobin: 10.8 g/dL — ABNORMAL LOW (ref 12.0–15.0)
MCH: 32.8 pg (ref 26.0–34.0)
MCHC: 31.7 g/dL (ref 30.0–36.0)
MCV: 103.6 fL — ABNORMAL HIGH (ref 80.0–100.0)
Platelets: 210 10*3/uL (ref 150–400)
RBC: 3.29 MIL/uL — ABNORMAL LOW (ref 3.87–5.11)
RDW: 15 % (ref 11.5–15.5)
WBC: 14.1 10*3/uL — ABNORMAL HIGH (ref 4.0–10.5)
nRBC: 0 % (ref 0.0–0.2)

## 2022-05-07 LAB — BASIC METABOLIC PANEL
Anion gap: 7 (ref 5–15)
BUN: 16 mg/dL (ref 8–23)
CO2: 22 mmol/L (ref 22–32)
Calcium: 8.3 mg/dL — ABNORMAL LOW (ref 8.9–10.3)
Chloride: 110 mmol/L (ref 98–111)
Creatinine, Ser: 0.89 mg/dL (ref 0.44–1.00)
GFR, Estimated: >60 mL/min (ref >60)
Glucose, Bld: 97 mg/dL (ref 70–99)
Potassium: 4 mmol/L (ref 3.5–5.1)
Sodium: 139 mmol/L (ref 135–145)

## 2022-05-07 MED ORDER — POTASSIUM CHLORIDE CRYS ER 10 MEQ PO TBCR
10.0000 meq | EXTENDED_RELEASE_TABLET | Freq: Every day | ORAL | 0 refills | Status: DC
  Filled 2022-05-07: qty 30, 30d supply, fill #0

## 2022-05-07 MED ORDER — METOPROLOL TARTRATE 25 MG PO TABS
25.0000 mg | ORAL_TABLET | Freq: Two times a day (BID) | ORAL | 0 refills | Status: DC
  Filled 2022-05-07: qty 60, 30d supply, fill #0

## 2022-05-07 MED ORDER — CEPHALEXIN 250 MG PO CAPS
250.0000 mg | ORAL_CAPSULE | Freq: Three times a day (TID) | ORAL | 0 refills | Status: AC
  Filled 2022-05-07: qty 6, 2d supply, fill #0

## 2022-05-07 MED ORDER — EMPAGLIFLOZIN 10 MG PO TABS
10.0000 mg | ORAL_TABLET | Freq: Every day | ORAL | 0 refills | Status: DC
  Filled 2022-05-07: qty 30, 30d supply, fill #0

## 2022-05-07 MED ORDER — APIXABAN 5 MG PO TABS: 5.0000 mg | ORAL_TABLET | Freq: Two times a day (BID) | ORAL | Status: DC

## 2022-05-07 NOTE — Care Management Important Message (Signed)
Important Message  Patient Details  Name: RONDI IVY MRN: [id number] Date of Birth: [birth year]   Medicare Important Message Given:  Yes     Shelda Altes 05/07/2022, 1:20 PM

## 2022-05-07 NOTE — Discharge Summary (Signed)
Physician Discharge Summary  Natalie West DJS:[id number] DOB: 1931/10/08 DOA: 05/02/2022  PCP: Christain Sacramento, MD  Admit date: 05/02/2022 Discharge date: 05/07/2022  Admitted From: Home Disposition:  Home  Discharge Condition:Stable CODE STATUS: DNR Diet recommendation: Dysphagia 3  Brief/Interim Summary:  Patient is a 88 yo female with the past medical history of paroxysmal atrial fibrillation, hypertension, breast cancer and malnutrition who presented with palpitations and dyspnea. She had a recent diagnosis of pulmonary embolism and deep vein thrombosis. As outpatient she has been on antibiotic therapy for pneumonia, on the day of hospitalization her 02 was 87% on room air, prompting her transfer to the hospital.  She was found to have bilateral rales, increased work of breathing.  Chest CT showed bilateral groundglass opacity, interlobular thickening.  Patient was admitted for further management of acute on chronic diastolic congestive heart failure, started on IV Lasix.  Patient was seen by PT/OT during this hospitalization and recommended skilled nursing facility which family declined.  Palliative care was also following for goals of care.  She appears euvolemic today.  She has been transitioned to oral Lasix.  Since family declined skilled nursing facility, we are discharging her home with home health.  Following problems were addressed during her hospitalization:  Acute on chronic diastolic CHF (congestive heart failure) (HCC) Echocardiogram with preserved LV systolic function EF 55 to 60%, mild asymmetric hypertrophy, RV systolic function is preserved. Mild to moderate aortic valve regurgitation.  She was given IV Lasix.  Currently she appears euvolemic, Lasix changed to 20 mg daily . Added SGLT2 inhibitor    Atrial fibrillation, chronic (HCC) Atrial fibrillation with RVR Patient received digoxin on admission with improvement in heart rate. Rhythm continue in atrial fibrillation.   Her  rate continue to be controlled in the 80 range.   Continue rate control with metoprolol and anticoagulation with apixaban.     Essential hypertension Patient hypotensive on admission. She has been tolerating well metoprolol and diuresis with furosemide.     Protein-calorie malnutrition, moderate ([place]) Continue with nutrition recommendations. Swallow dysfunction, continue with dysphagia 3 diet and aspiration precautions per speech therapy recommendations.    Depression Continue with amitriptyline, sertraline, and mirtazapine.    CAP (community acquired pneumonia) Pneumonia diagnosed as outpatient and receiving antibiotic therapy with Augmentin.  Currently stable.  On room air   Urine infection present on admission.  Patient with worsening leukocytosis.  Urine positive for pyuria on admission, past culture positive for Klebsiella which was resistant to ampicillin (on 04/2022). Started on short course of oral antibiotics.   Decubitus skin ulcer Present on admission    Hypokalemia Continue potassium supplementation at home.    Pulmonary embolism (HCC) Recent diagnosis of pulmonary embolism, 04/2022.  Positive DVT right common femoral vein and SF junction, acute DVT left common femoral vein, left femoral vein, left proximal profunda and left popliteal vein.   Continue anticoagulation with apixaban.   Discharge Diagnoses:  Principal Problem:   Acute on chronic diastolic CHF (congestive heart failure) (HCC) Active Problems:   Atrial fibrillation, chronic (HCC)   Essential hypertension   Protein-calorie malnutrition, moderate (HCC)   Depression   CAP (community acquired pneumonia)   Decubitus skin ulcer   Hypokalemia   Pulmonary embolism [facility])    Discharge Instructions  Discharge Instructions     Diet general   Complete by: As directed    Dysphagia 3   Discharge instructions   Complete by: As directed    1)Please take prescribed medications as  instructed 2)Follow  up with your PCP in a week 3)Do a CBC and BMP test during the follow-up with your PCP in a week 4)Follow up with outpatient palliative care 5)Follow up with your cardiologist as an outpatient   Increase activity slowly   Complete by: As directed    No wound care   Complete by: As directed       Allergies as of 05/07/2022       Reactions   Cipro [ciprofloxacin Hcl] Other (See Comments)   Unknown reaction   Cleocin [clindamycin] Other (See Comments)   Unknown reaction   Lincocin [lincomycin Hcl] Other (See Comments)   Unknown reaction   Codeine Nausea And Vomiting   Flexeril [cyclobenzaprine Hcl] Other (See Comments)   Unknown reaction   Hydrocodone Other (See Comments)   "Last time it was too strong and messed up my mind"   Macrobid [nitrofurantoin] Other (See Comments)   Unknown reaction   Nalfon [fenoprofen Calcium] Other (See Comments)   Unknown reaction   Noroxin [norfloxacin] Other (See Comments)   Unknown reaction   Relafen [nabumetone] Other (See Comments)   Unknown reaction   Sulfa Antibiotics Other (See Comments)   Unknown reaction   Voltaren [diclofenac] Other (See Comments)   Unknown reaction        Medication List     STOP taking these medications    amoxicillin-clavulanate 875-125 MG tablet Commonly known as: AUGMENTIN   atenolol 25 MG tablet Commonly known as: TENORMIN   traMADol 50 MG tablet Commonly known as: ULTRAM       TAKE these medications    acetaminophen 325 MG tablet Commonly known as: TYLENOL Take 2 tablets (650 mg total) by mouth every 6 (six) hours as needed. What changed: reasons to take this   albuterol (2.5 MG/3ML) 0.083% nebulizer solution Commonly known as: PROVENTIL Take 2.5 mg by nebulization every 4 (four) hours as needed for wheezing or shortness of breath.   amitriptyline 25 MG tablet Commonly known as: ELAVIL Take 1 tablet (25 mg total) by mouth in the morning and at bedtime.   apixaban 5 MG Tabs  tablet Commonly known as: ELIQUIS Take 1 tablet (5 mg total) by mouth 2 (two) times daily.   benzonatate 100 MG capsule Commonly known as: TESSALON Take 1 capsule (100 mg total) by mouth every 8 (eight) hours.   cephALEXin 250 MG capsule Commonly known as: KEFLEX Take 1 capsule (250 mg total) by mouth every 8 (eight) hours for 2 days.   empagliflozin 10 MG Tabs tablet Commonly known as: JARDIANCE Take 1 tablet (10 mg total) by mouth daily. Start taking on: May 08, 2022   Ensure Take 237 mLs by mouth daily.   furosemide 20 MG tablet Commonly known as: LASIX Take 1 tablet (20 mg total) by mouth daily as needed for fluid or edema.   megestrol 400 MG/10ML suspension Commonly known as: MEGACE Take 10 mLs (400 mg total) by mouth 2 (two) times daily.   metoprolol tartrate 25 MG tablet Commonly known as: LOPRESSOR Take 1 tablet (25 mg total) by mouth 2 (two) times daily.   mirtazapine 15 MG tablet Commonly known as: REMERON Take 1 tablet (15 mg total) by mouth at bedtime.   multivitamin with minerals Tabs tablet Take 1 tablet by mouth daily. Centrum Silver for Women   pantoprazole 40 MG tablet Commonly known as: PROTONIX Take 1 tablet (40 mg total) by mouth daily. 1 Tablet Daily for Heartburn and Acid Reflux  polyethylene glycol 17 g packet Commonly known as: MIRALAX / GLYCOLAX Take 17 g by mouth 2 (two) times daily as needed for mild constipation.   potassium chloride 10 MEQ tablet Commonly known as: KLOR-CON M Take 1 tablet (10 mEq total) by mouth daily. Start taking on: May 08, 2022   pravastatin 20 MG tablet Commonly known as: PRAVACHOL Take 20 mg by mouth at bedtime.   senna-docusate 8.6-50 MG tablet Commonly known as: Senokot-S Take 1 tablet by mouth 2 (two) times daily as needed for mild constipation.   sertraline 25 MG tablet Commonly known as: ZOLOFT Take 1 tablet (25 mg total) by mouth daily.               Durable Medical Equipment  (From  admission, onward)           Start     Ordered   05/04/22 1029  For home use only DME Other see comment  Once       Comments: Natalie West  Question:  Length of Need  Answer:  Lifetime   05/04/22 1028   05/04/22 1027  For home use only DME standard manual wheelchair with seat cushion  Once       Comments: Patient suffers from Acute on chronic diastolic CHF  which impairs their ability to perform daily activities like ambulating  in the home.  A cane  will not resolve issue with performing activities of daily living. A wheelchair will allow patient to safely perform daily activities. Patient can safely propel the wheelchair in the home or has a caregiver who can provide assistance. Length of need lifetime. Accessories: elevating leg rests (ELRs), wheel locks, extensions and anti-tippers.  Seat and back cushions   05/04/22 1028   05/04/22 1026  For home use only DME Walker rolling  Once       Question Answer Comment  Walker: With 5 Inch Wheels   Patient needs a walker to treat with the following condition Weakness      05/04/22 Natalie West, Well Natalie West Follow up.   Specialty: Home Health Services Contact information: [address] [place] [address] [phone]         Christain Sacramento, MD. Go on 05/12/2022.   Specialty: Family Medicine Why: '@3'$ :30pm Contact information: [address] [place]  [address] [phone]         Troy Sine, MD .   Specialty: Cardiology Contact information: [address] [place] [address] [phone]         [facility] Follow up.   Why: outpatient palliative services will resume Contact information: [place] [address] [phone]               Allergies  Allergen Reactions   Cipro [Ciprofloxacin Hcl] Other (See Comments)    Unknown reaction   Cleocin [Clindamycin] Other (See  Comments)    Unknown reaction   Lincocin [Lincomycin Hcl] Other (See Comments)    Unknown reaction   Codeine Nausea And Vomiting   Flexeril [Cyclobenzaprine Hcl] Other (See Comments)    Unknown reaction   Hydrocodone Other (See Comments)    "Last time it was too strong and messed up my mind"   Macrobid [Nitrofurantoin] Other (See Comments)    Unknown reaction   Nalfon [Fenoprofen Calcium] Other (See Comments)  Unknown reaction   Noroxin [Norfloxacin] Other (See Comments)    Unknown reaction    Relafen [Nabumetone] Other (See Comments)    Unknown reaction    Sulfa Antibiotics Other (See Comments)    Unknown reaction   Voltaren [Diclofenac] Other (See Comments)    Unknown reaction     Consultations: None   Procedures/Studies: CT Angio Chest PE W and/or Wo Contrast  Result Date: 05/02/2022 CLINICAL DATA:  Pulmonary embolism (PE) suspected, high prob Recent pneumonia and recent PE. Worsened shortness of breath, tachycardia, and hypoxia. Rule out new pulmonary emboli or large pneumonia. Also concern for aspiration today EXAM: CT ANGIOGRAPHY CHEST WITH CONTRAST TECHNIQUE: Multidetector CT imaging of the chest was performed using the standard protocol during bolus administration of intravenous contrast. Multiplanar CT image reconstructions and MIPs were obtained to evaluate the vascular anatomy. RADIATION DOSE REDUCTION: This exam was performed according to the departmental dose-optimization program which includes automated exposure control, adjustment of the mA and/or kV according to patient size and/or use of iterative reconstruction technique. CONTRAST:  16m OMNIPAQUE IOHEXOL 350 MG/ML SOLN COMPARISON:  CT dated April 12, 2022 common January 21, 2022 FINDINGS: Cardiovascular: Evaluation is limited secondary to respiratory motion, particularly at the bases. Cardiomegaly. No large pericardial effusion. Atherosclerotic calcifications of the nonaneurysmal thoracic aorta. Main pulmonary artery  is enlarged in comparison to the ascending thoracic aorta which may reflect a degree of underlying pulmonary arterial hypertension. Previously described filling defects of the RIGHT lower lobe and LEFT main pulmonary artery are not discretely visualized on today's exam. No new large pulmonary embolism. Mediastinum/Nodes: Status post RIGHT axillary node dissection. No new suspicious axillary adenopathy. Prominent mediastinal lymph nodes with representative RIGHT paratracheal lymph node measuring 10 mm in the short axis (series 6, image 88). This is not significantly changed in comparison to prior. Representative LEFT hilar lymph node superior to the LEFT upper lobe pulmonary artery measures 10 mm, similar in comparison to prior (series 6, image 80) Lungs/Pleura: Small pleural effusions. Bronchial wall thickening. Apical predominant ground-glass opacities with suggestion of interlobular septal thickening on a background of subpleural reticulation and traction bronchiectasis. Ground-glass opacities are similar 2 mildly increased in comparison to prior CT but increased since March 2023. Upper Abdomen: Small hiatal hernia. Musculoskeletal: Healing LEFT anterior fourth through sixth rib fractures. Healing RIGHT anterior third and fourth rib fractures. Unchanged wedging of T3. Bone island of T1. Exaggerated thoracic kyphosis. Review of the MIP images confirms the above findings. IMPRESSION: 1. Previously described pulmonary emboli are no longer discretely visualized. No new large pulmonary embolism within the limitations of this motion filled exam. 2. Apical predominant ground-glass opacities with suggestion of interlobular septal thickening superimposed on a background of pulmonary fibrosis. Overall, this is increased since March 2023 and is similar to mildly increased in comparison to most recent prior from June 2023. This is favored to reflect pulmonary edema given presence of bronchial wall thickening and small  bilateral pleural effusions. Differential considerations include atypical infection or progressive pulmonary interstitial lung disease such as hypersensitivity pneumonitis. 3. Enlargement of the pulmonary artery as can be seen in pulmonary arterial hypertension. 4. Mediastinal and hilar adenopathy, likely reactive. 5. Healing bilateral anterior rib fractures. Aortic Atherosclerosis (ICD10-I70.0). Electronically Signed   By: SValentino SaxonM.D.   On: 05/02/2022 16:06   DG Chest 2 View  Result Date: 05/02/2022 CLINICAL DATA:  cp/aspiration EXAM: CHEST - 2 VIEW COMPARISON:  April 24, 2022, April 12 2022 FINDINGS: The cardiomediastinal silhouette is unchanged and  enlarged in contour.Atherosclerotic calcifications of the aorta. Small bilateral pleural effusions. No pneumothorax. Similar appearance of diffuse reticular prominence likely reflecting underlying pulmonary fibrosis. Mild reticulonodularity within the RIGHT lower lung. Visualized abdomen is unremarkable. Multilevel degenerative changes of the thoracic spine; osteopenia limits evaluation. Surgical clips project over the RIGHT breast. IMPRESSION: Mild reticulonodularity of the RIGHT lung base may reflect sequela of aspiration. Bilateral pleural effusions with similar appearance of background of likely pulmonary fibrosis. Electronically Signed   By: Valentino Saxon M.D.   On: 05/02/2022 13:16   DG Chest Portable 1 View  Result Date: 04/24/2022 CLINICAL DATA:  Shortness of breath and hypoxia. EXAM: PORTABLE CHEST 1 VIEW COMPARISON:  Chest radiograph and CTA 04/12/2022 FINDINGS: The patient's chin obscures portions of the lung apices, more notable on the right. The cardiac silhouette remains enlarged. Aortic atherosclerosis is noted. Lung volumes are low with chronic interstitial coarsening. Hazy bilateral upper lobe opacities on the prior radiograph have slightly improved. No sizable pleural effusion or pneumothorax is identified. No acute osseous  abnormality is seen. IMPRESSION: Slight improvement of bilateral upper lobe infiltrates superimposed on chronic lung disease. Electronically Signed   By: Logan Bores M.D.   On: 04/24/2022 16:18   VAS [place] LOWER EXTREMITY VENOUS (DVT)  Result Date: 04/13/2022  Lower Venous DVT Study Patient Name:  Natalie West  Date of Exam:   04/13/2022 Medical Rec #: [id number]   Accession #:    [id number] Date of Birth: Feb 19, 1931   Patient Gender: F Patient Age:   32 years Exam Location:  [facility] Procedure:      VAS [place] LOWER EXTREMITY VENOUS (DVT) Referring Phys: Gean Birchwood --------------------------------------------------------------------------------  Indications: Pulmonary embolism.  Anticoagulation: Heparin. Limitations: Compressions not performed distal to acute findings. Comparison Study: 01-01-2022 Prior right lower extremity venous was positive for                   varicose SVT involving the right thigh veins. Performing Technologist: Darlin Coco RDMS, RVT  Examination Guidelines: A complete evaluation includes B-mode imaging, spectral Doppler, color Doppler, and power Doppler as needed of all accessible portions of each vessel. Bilateral testing is considered an integral part of a complete examination. Limited examinations for reoccurring indications may be performed as noted. The reflux portion of the exam is performed with the patient in reverse Trendelenburg.  +---------+---------------+---------+-----------+----------+--------------+ RIGHT    CompressibilityPhasicitySpontaneityPropertiesThrombus Aging +---------+---------------+---------+-----------+----------+--------------+ CFV      Partial        Yes      Yes                  Acute          +---------+---------------+---------+-----------+----------+--------------+ SFJ      Partial        Yes      Yes                  Acute          +---------+---------------+---------+-----------+----------+--------------+ FV Prox                  Yes      Yes                                 +---------+---------------+---------+-----------+----------+--------------+ FV Mid                  Yes      Yes                                 +---------+---------------+---------+-----------+----------+--------------+  FV Distal               Yes      Yes                                 +---------+---------------+---------+-----------+----------+--------------+ PFV                     Yes      Yes                                 +---------+---------------+---------+-----------+----------+--------------+ POP                     Yes      Yes                                 +---------+---------------+---------+-----------+----------+--------------+ PTV                     Yes      Yes                                 +---------+---------------+---------+-----------+----------+--------------+ PERO                    Yes      Yes                                 +---------+---------------+---------+-----------+----------+--------------+   +----------+---------------+---------+-----------+----------+--------------+ LEFT      CompressibilityPhasicitySpontaneityPropertiesThrombus Aging +----------+---------------+---------+-----------+----------+--------------+ CFV       Partial        Yes      Yes                  Acute          +----------+---------------+---------+-----------+----------+--------------+ SFJ       Partial        Yes      Yes                  Acute          +----------+---------------+---------+-----------+----------+--------------+ FV Prox   Partial        Yes      Yes                  Acute          +----------+---------------+---------+-----------+----------+--------------+ FV Mid                   No       No                   Acute          +----------+---------------+---------+-----------+----------+--------------+ FV Distal                No       No                    Acute          +----------+---------------+---------+-----------+----------+--------------+ PFV       None           No       No  Acute          +----------+---------------+---------+-----------+----------+--------------+ POP                      No       No                   Acute          +----------+---------------+---------+-----------+----------+--------------+ PTV                      Yes      Yes                                 +----------+---------------+---------+-----------+----------+--------------+ PERO                     Yes      Yes                                 +----------+---------------+---------+-----------+----------+--------------+ EIV Distal               Yes      Yes                                 +----------+---------------+---------+-----------+----------+--------------+     Summary: RIGHT: - Findings consistent with acute deep vein thrombosis involving the right common femoral vein, and SF junction. - No cystic structure found in the popliteal fossa. - Common femoral vein obstruction doesn't appear to extend above inguinal ligament.  LEFT: - Findings consistent with acute deep vein thrombosis involving the left common femoral vein, SF junction, left femoral vein, left proximal profunda vein, and left popliteal vein. - A cystic structure is found in the popliteal fossa. - The common femoral vein obstruction does not appear to extend above inguinal ligament.  *See table(s) above for measurements and observations. Electronically signed by Servando Snare MD on 04/13/2022 at 6:03:48 PM.    Final    ECHOCARDIOGRAM COMPLETE  Result Date: 04/13/2022    ECHOCARDIOGRAM REPORT   Patient Name:   Natalie West Date of Exam: 04/13/2022 Medical Rec #:  [id number]  Height:       62.0 in Accession #:    [id number] Weight:       130.0 lb Date of Birth:  11-Jan-1931  BSA:          1.592 m Patient Age:    77 years   BP:           129/73 mmHg Patient  Gender: F          HR:           90 bpm. Exam Location:  Inpatient Procedure: 2D Echo, Color Doppler and Cardiac Doppler Indications:    Pulmonary embolism  History:        Patient has prior history of Echocardiogram examinations, most                 recent 06/02/2021. Pulmonary HTN, Arrythmias:Atrial Fibrillation;                 Signs/Symptoms:Shortness of Breath.  Sonographer:    Joette Catching RCS Referring Phys: [place]  Sonographer Comments: Technically challenging study due to limited acoustic windows, suboptimal apical window and suboptimal subcostal window.  Image acquisition challenging due to uncooperative patient. IMPRESSIONS  1. Left ventricular ejection fraction, by estimation, is 55 to 60%. The left ventricle has normal function. The left ventricle has no regional wall motion abnormalities. There is mild asymmetric left ventricular hypertrophy. Left ventricular diastolic parameters are indeterminate.  2. Right ventricular systolic function is normal. The right ventricular size is mildly enlarged. There is normal pulmonary artery systolic pressure.  3. Left atrial size was mildly dilated.  4. The mitral valve is grossly normal. Mild mitral valve regurgitation.  5. The aortic valve is tricuspid. Aortic valve regurgitation is mild to moderate.  6. Aortic no significnt ascending aortic aneurysm.  7. The inferior vena cava is normal in size with greater than 50% respiratory variability, suggesting right atrial pressure of 3 mmHg. Comparison(s): No significant change from prior study. FINDINGS  Left Ventricle: Left ventricular ejection fraction, by estimation, is 55 to 60%. The left ventricle has normal function. The left ventricle has no regional wall motion abnormalities. The left ventricular internal cavity size was normal in size. There is  mild asymmetric left ventricular hypertrophy. Left ventricular diastolic parameters are indeterminate. Right Ventricle: The right ventricular size is  mildly enlarged. Right ventricular systolic function is normal. There is normal pulmonary artery systolic pressure. The tricuspid regurgitant velocity is 2.40 m/s, and with an assumed right atrial pressure of 3 mmHg, the estimated right ventricular systolic pressure is 40.1 mmHg. Left Atrium: Left atrial size was mildly dilated. Right Atrium: Right atrial size was normal in size. Pericardium: There is no evidence of pericardial effusion. Mitral Valve: The mitral valve is grossly normal. Mild mitral valve regurgitation. Tricuspid Valve: The tricuspid valve is normal in structure. Tricuspid valve regurgitation is mild. Aortic Valve: The aortic valve is tricuspid. Aortic valve regurgitation is mild to moderate. Aortic regurgitation PHT measures 528 msec. Aortic valve mean gradient measures 3.0 mmHg. Aortic valve peak gradient measures 4.8 mmHg. Aortic valve area, by VTI  measures 1.98 cm. Pulmonic Valve: The pulmonic valve was not well visualized. Pulmonic valve regurgitation is trivial. Aorta: No significnt ascending aortic aneurysm. Venous: The inferior vena cava is normal in size with greater than 50% respiratory variability, suggesting right atrial pressure of 3 mmHg.  LEFT VENTRICLE PLAX 2D LVIDd:         4.20 cm   Diastology LVIDs:         3.05 cm   LV e' medial:    7.29 cm/s LV PW:         0.95 cm   LV E/e' medial:  10.5 LV IVS:        0.80 cm   LV e' lateral:   11.30 cm/s LVOT diam:     2.10 cm   LV E/e' lateral: 6.8 LV SV:         45 LV SV Index:   28 LVOT Area:     3.46 cm  RIGHT VENTRICLE          IVC RV Basal diam:  4.50 cm  IVC diam: 1.70 cm RV Mid diam:    3.50 cm LEFT ATRIUM             Index        RIGHT ATRIUM           Index LA diam:        4.10 cm 2.58 cm/m   RA Area:     16.00 cm LA Vol (A2C):   53.8 ml 33.80 ml/m  RA Volume:  43.10 ml  27.08 ml/m LA Vol (A4C):   37.7 ml 23.68 ml/m LA Biplane Vol: 45.6 ml 28.65 ml/m  AORTIC VALVE                    PULMONIC VALVE AV Area (Vmax):    2.24 cm      PV Vmax:       0.81 m/s AV Area (Vmean):   2.09 cm     PV Peak grad:  2.6 mmHg AV Area (VTI):     1.98 cm AV Vmax:           110.00 cm/s AV Vmean:          86.800 cm/s AV VTI:            0.227 m AV Peak Grad:      4.8 mmHg AV Mean Grad:      3.0 mmHg LVOT Vmax:         71.10 cm/s LVOT Vmean:        52.400 cm/s LVOT VTI:          0.130 m LVOT/AV VTI ratio: 0.57 AI PHT:            528 msec AR Vena Contracta: 0.40 cm  AORTA Ao Root diam: 3.90 cm Ao Asc diam:  3.70 cm MITRAL VALVE               TRICUSPID VALVE MV Area (PHT): 4.49 cm    TR Peak grad:   23.0 mmHg MV Decel Time: 169 msec    TR Vmax:        240.00 cm/s MV E velocity: 76.70 cm/s                            SHUNTS                            Systemic VTI:  0.13 m                            Systemic Diam: 2.10 cm Phineas Inches Electronically signed by Phineas Inches Signature Date/Time: 04/13/2022/11:44:33 AM    Final    CT Angio Chest Pulmonary Embolism (PE) W or WO Contrast  Result Date: 04/12/2022 CLINICAL DATA:  Pulmonary embolism (PE) suspected, high prob. Cough. Difficulty breathing. EXAM: CT ANGIOGRAPHY CHEST WITH CONTRAST TECHNIQUE: Multidetector CT imaging of the chest was performed using the standard protocol during bolus administration of intravenous contrast. Multiplanar CT image reconstructions and MIPs were obtained to evaluate the vascular anatomy. RADIATION DOSE REDUCTION: This exam was performed according to the departmental dose-optimization program which includes automated exposure control, adjustment of the mA and/or kV according to patient size and/or use of iterative reconstruction technique. CONTRAST:  72m OMNIPAQUE IOHEXOL 350 MG/ML SOLN COMPARISON:  01/21/2022 FINDINGS: Cardiovascular: Filling defects in the right lower lobe pulmonary arteries compatible with pulmonary emboli. No evidence of right heart strain. Cardiomegaly. Prominent central pulmonary arteries suggest pulmonary arterial hypertension. Coronary artery and aortic  calcifications. No aneurysm. Mediastinum/Nodes: No mediastinal, hilar, or axillary adenopathy. Trachea and esophagus are unremarkable. Thyroid unremarkable. Lungs/Pleura: Centrilobular emphysema. Ground-glass opacities within the upper lung zones. No confluent opacities or effusions. Upper Abdomen: No acute findings Musculoskeletal: Chest wall soft tissues are unremarkable. No acute bony abnormality. Review of the MIP images confirms the above findings. IMPRESSION: Positive for acute  pulmonary emboli in the right lower lobe. Cardiomegaly, coronary artery disease. Prominent central pulmonary arteries suggest pulmonary arterial hypertension. Aortic Atherosclerosis (ICD10-I70.0) and Emphysema (ICD10-J43.9). Electronically Signed   By: Rolm Baptise M.D.   On: 04/12/2022 22:20   DG Chest Port 1 View  Result Date: 04/12/2022 CLINICAL DATA:  Family states patient has completed x2 courses of abx for a uti but it returns when course is complete. Pt has been confused and having hallucinations for the past 10 days. EXAM: PORTABLE CHEST 1 VIEW COMPARISON:  CT, 01/21/2022.  Chest radiograph, 12/06/2021. FINDINGS: Cardiac silhouette borderline enlarged. No mediastinal or hilar masses. Low lung volumes. Coarse bilateral interstitial thickening. Intervening hazy airspace opacities no left mid and upper lung and right upper lung, new compared to the prior radiographs. No convincing pleural effusion.  No pneumothorax. Skeletal structures are grossly intact. IMPRESSION: 1. Hazy bilateral airspace lung opacities superimposed on chronic interstitial thickening. Findings suspicious for multifocal pneumonia. Electronically Signed   By: Lajean Manes M.D.   On: 04/12/2022 17:22      Subjective: Patient seen and examined at the bedside this morning.  Hemodynamically stable.  Very deconditioned, lying in bed.  Not in apparent distress.  Appears euvolemic.  Eager to go home.  I called the patient's daughter-in-law and again discussed  about our recommendation to be discharged to skilled facility but she declines and says she has good support at home and wants her to go back to home today  Discharge Exam: Vitals:   05/07/22 0420 05/07/22 1046  BP: 101/72 129/80  Pulse: 82 95  Resp: 20 19  Temp: 98.7 F (37.1 C) 98.3 F (36.8 C)  SpO2: 94% 96%   Vitals:   05/06/22 1128 05/06/22 2212 05/07/22 0420 05/07/22 1046  BP: 116/69 (!) 111/59 101/72 129/80  Pulse: 91 93 82 95  Resp: '20 20 20 19  '$ Temp: 98.1 F (36.7 C) 98.9 F (37.2 C) 98.7 F (37.1 C) 98.3 F (36.8 C)  TempSrc: Oral Oral Oral Oral  SpO2: 95% 94% 94% 96%  Weight:   53.6 kg   Height:        General: Pt is alert, awake, not in acute distress, thin built, very deconditioned Cardiovascular: Irregular regular rhythm Respiratory: Fine crackles bilaterally, no wheezing  abdominal: Soft, NT, ND, bowel sounds + Extremities: no edema, no cyanosis    The results of significant diagnostics from this hospitalization (including imaging, microbiology, ancillary and laboratory) are listed below for reference.     Microbiology: Recent Results (from the past 240 hour(s))  Blood culture (routine x 2)     Status: None (Preliminary result)   Collection Time: 05/02/22  1:36 PM   Specimen: BLOOD  Result Value Ref Range Status   Specimen Description BLOOD SITE NOT SPECIFIED  Final   Special Requests   Final    BOTTLES DRAWN AEROBIC AND ANAEROBIC Blood Culture results may not be optimal due to an inadequate volume of blood received in culture bottles   Culture   Final    NO GROWTH 4 DAYS Performed at [facility], [place] [address] [place], [place] [address]    Report Status PENDING  Incomplete  Blood culture (routine x 2)     Status: None (Preliminary result)   Collection Time: 05/02/22  2:20 PM   Specimen: BLOOD  Result Value Ref Range Status   Specimen Description BLOOD SITE NOT SPECIFIED  Final   Special Requests   Final    BOTTLES DRAWN  AEROBIC AND  ANAEROBIC Blood Culture adequate volume   Culture   Final    NO GROWTH 4 DAYS Performed at [facility] [address] [place], [place] [address]    Report Status PENDING  Incomplete     Labs: BNP (last 3 results) Recent Labs    05/29/21 1134 04/12/22 1641 05/02/22 1337  BNP 283.2* 133.1* 631.4*   Basic Metabolic Panel: Recent Labs  Lab 05/02/22 1202 05/03/22 0435 05/04/22 0735 05/05/22 0148 05/06/22 0206 05/07/22 0335  NA 140 141 139 141 140 139  K 3.4* 3.9 3.3* 4.6 4.0 4.0  CL 112* 110 113* 111 107 110  CO2 19* 21* 21* 21* 24 22  GLUCOSE 103* 100* 93 106* 101* 97  BUN '18 16 18 19 17 16  '$ CREATININE 0.64 0.74 0.65 0.72 0.80 0.89  CALCIUM 8.2* 8.1* 7.8* 8.4* 8.5* 8.3*  MG 2.0  --   --   --   --   --    Liver Function Tests: No results for input(s): "AST", "ALT", "ALKPHOS", "BILITOT", "PROT", "ALBUMIN" in the last 168 hours. No results for input(s): "LIPASE", "AMYLASE" in the last 168 hours. No results for input(s): "AMMONIA" in the last 168 hours. CBC: Recent Labs  Lab 05/02/22 1202 05/04/22 0735 05/05/22 0148 05/06/22 0206 05/07/22 0335  WBC 17.2* 10.9* 12.8* 15.0* 14.1*  HGB 11.7* 10.2* 10.7* 11.1* 10.8*  HCT 36.6 31.9* 33.5* 33.8* 34.1*  MCV 105.5* 103.9* 104.7* 101.8* 103.6*  PLT 209 183 215 222 210   Cardiac Enzymes: No results for input(s): "CKTOTAL", "CKMB", "CKMBINDEX", "TROPONINI" in the last 168 hours. BNP: Invalid input(s): "POCBNP" CBG: Recent Labs  Lab 05/02/22 2135 05/03/22 0611 05/04/22 0004 05/04/22 0622  GLUCAP 94 84 115* 105*   D-Dimer No results for input(s): "DDIMER" in the last 72 hours. Hgb A1c No results for input(s): "HGBA1C" in the last 72 hours. Lipid Profile No results for input(s): "CHOL", "HDL", "LDLCALC", "TRIG", "CHOLHDL", "LDLDIRECT" in the last 72 hours. Thyroid function studies No results for input(s): "TSH", "T4TOTAL", "T3FREE", "THYROIDAB" in the last 72 hours.  Invalid input(s): "FREET3" Anemia  work up No results for input(s): "VITAMINB12", "FOLATE", "FERRITIN", "TIBC", "IRON", "RETICCTPCT" in the last 72 hours. Urinalysis    Component Value Date/Time   COLORURINE YELLOW 04/12/2022 1641   APPEARANCEUR HAZY (A) 04/12/2022 1641   LABSPEC 1.014 04/12/2022 1641   PHURINE 5.0 04/12/2022 1641   GLUCOSEU NEGATIVE 04/12/2022 1641   HGBUR SMALL (A) 04/12/2022 1641   BILIRUBINUR NEGATIVE 04/12/2022 1641   BILIRUBINUR n 02/18/2015 1404   KETONESUR NEGATIVE 04/12/2022 1641   PROTEINUR NEGATIVE 04/12/2022 1641   UROBILINOGEN negative 02/18/2015 1404   UROBILINOGEN 0.2 02/28/2013 0739   NITRITE NEGATIVE 04/12/2022 1641   LEUKOCYTESUR MODERATE (A) 04/12/2022 1641   Sepsis Labs Recent Labs  Lab 05/04/22 0735 05/05/22 0148 05/06/22 0206 05/07/22 0335  WBC 10.9* 12.8* 15.0* 14.1*   Microbiology Recent Results (from the past 240 hour(s))  Blood culture (routine x 2)     Status: None (Preliminary result)   Collection Time: 05/02/22  1:36 PM   Specimen: BLOOD  Result Value Ref Range Status   Specimen Description BLOOD SITE NOT SPECIFIED  Final   Special Requests   Final    BOTTLES DRAWN AEROBIC AND ANAEROBIC Blood Culture results may not be optimal due to an inadequate volume of blood received in culture bottles   Culture   Final    NO GROWTH 4 DAYS Performed at [facility], [place] [address], [place], [place] [address]    Report Status PENDING  Incomplete  Blood culture (routine x 2)     Status: None (Preliminary result)   Collection Time: 05/02/22  2:20 PM   Specimen: BLOOD  Result Value Ref Range Status   Specimen Description BLOOD SITE NOT SPECIFIED  Final   Special Requests   Final    BOTTLES DRAWN AEROBIC AND ANAEROBIC Blood Culture adequate volume   Culture   Final    NO GROWTH 4 DAYS Performed at [facility], [address] [place], [place] [address]    Report Status PENDING  Incomplete    Please note: You were cared for by a hospitalist during your  hospital stay. Once you are discharged, your primary care physician will handle any further medical issues. Please note that NO REFILLS for any discharge medications will be authorized once you are discharged, as it is imperative that you return to your primary care physician (or establish a relationship with a primary care physician if you do not have one) for your post hospital discharge needs so that they can reassess your need for medications and monitor your lab values.    Time coordinating discharge: 40 minutes  SIGNED:   Shelly Coss, MD  [facility] 05/07/2022, 10:55 AM Pager [phone]  If 7PM-7AM, please contact night-coverage [email or web address] Password [email or web address]

## 2022-05-07 NOTE — Progress Notes (Signed)
Occupational Therapy Treatment Patient Details Name: Natalie West MRN: [id number] DOB: 1931-09-19 Today's Date: 05/07/2022   History of present illness 88 y.o. female presents to [facility] hospital on 05/02/2022 with cough and dysphagia. Pt admitted for management of CHF and aspiration PNA. PMH: recurrent falls, GERD, Breast CA s/p lumpectomy chemo/rads in remission, HTN, HLD, a.fib (not anticoagulated), CHF, and depression.   OT comments  Patient received in supine and agreeable to OT session. Patient agreed to attempt sitting on EOB and required max assist. Patient tolerated ~3 minutes seated on EOB and demonstrated posterior leaning and BLE extension. Patient's hips began to slide closer to EOB and was assist back to supine with total assist for safety. Patient required total assist to position in bed. Patient performed light grooming and UE AROM while supine. Patient is expected to discharge to SNF today.    Recommendations for follow up therapy are one component of a multi-disciplinary discharge planning process, led by the attending physician.  Recommendations may be updated based on patient status, additional functional criteria and insurance authorization.    Follow Up Recommendations  Skilled nursing-short term rehab (<3 hours/day)    Assistance Recommended at Discharge Frequent or constant Supervision/Assistance  Patient can return home with the following  Two people to help with walking and/or transfers;Two people to help with bathing/dressing/bathroom   Equipment Recommendations  BSC/3in1;Wheelchair (measurements OT);Wheelchair cushion (measurements OT);Hospital bed    Recommendations for Other Services      Precautions / Restrictions Precautions Precautions: Fall;Other (comment) (significant fear of falling) Restrictions Weight Bearing Restrictions: No       Mobility Bed Mobility Overal bed mobility: Needs Assistance Bed Mobility: Supine to Sit, Sit to Supine     Supine to sit:  Max assist Sit to supine: Total assist   General bed mobility comments: max assist to get to EOB with assistance for BLE and trunk. Patient demonstrated increased hip extension and was total assist to return to supine    Transfers                   General transfer comment: deferred due to safety concerns     Balance Overall balance assessment: Needs assistance Sitting-balance support: Bilateral upper extremity supported Sitting balance-Leahy Scale: Zero Sitting balance - Comments: heavy posterior lean, bil LE's extended Postural control: Posterior lean                                 ADL either performed or assessed with clinical judgement   ADL Overall ADL's : Needs assistance/impaired     Grooming: Wash/dry hands;Wash/dry face;Supervision/safety;Bed level Grooming Details (indicate cue type and reason): at bed level                                    Extremity/Trunk Assessment              Vision       Perception     Praxis      Cognition Arousal/Alertness: Awake/alert Behavior During Therapy: Anxious Overall Cognitive Status: No family/caregiver present to determine baseline cognitive functioning Area of Impairment: Orientation, Following commands, Awareness, Memory, Problem solving                 Orientation Level: Disoriented to, Time, Place, Situation   Memory: Decreased short-term memory Following Commands: Follows one step commands with  increased time, Follows one step commands inconsistently   Awareness: Intellectual Problem Solving: Difficulty sequencing, Requires verbal cues, Requires tactile cues General Comments: fear of falling when on EOB        Exercises      Shoulder Instructions       General Comments      Pertinent Vitals/ Pain       Pain Assessment Pain Assessment: Faces Faces Pain Scale: Hurts little more Pain Location: R knee Pain Descriptors / Indicators: Aching Pain  Intervention(s): Limited activity within patient's tolerance, Monitored during session, Repositioned  Home Living                                          Prior Functioning/Environment              Frequency  Min 2X/week        Progress Toward Goals  OT Goals(current goals can now be found in the care plan section)  Progress towards OT goals: Progressing toward goals  Acute Rehab OT Goals Patient Stated Goal: get better OT Goal Formulation: With patient Time For Goal Achievement: 05/18/22 Potential to Achieve Goals: Fair ADL Goals Pt Will Perform Grooming: with min assist;sitting Pt Will Perform Upper Body Bathing: with min assist;sitting Pt Will Transfer to Toilet: with mod assist;squat pivot transfer;bedside commode Additional ADL Goal #1: Pt will maintain sitting at EOB for 10 min with Min A in preparation for ADLs  Plan Discharge plan remains appropriate    Co-evaluation                 AM-PAC OT "6 Clicks" Daily Activity     Outcome Measure   Help from another person eating meals?: A Little Help from another person taking care of personal grooming?: A Lot Help from another person toileting, which includes using toliet, bedpan, or urinal?: Total Help from another person bathing (including washing, rinsing, drying)?: Total Help from another person to put on and taking off regular upper body clothing?: Total Help from another person to put on and taking off regular lower body clothing?: Total 6 Click Score: 9    End of Session    OT Visit Diagnosis: Unsteadiness on feet (R26.81);Other abnormalities of gait and mobility (R26.89);Muscle weakness (generalized) (M62.81)   Activity Tolerance Patient tolerated treatment well   Patient Left in bed;with call bell/phone within reach;with bed alarm set   Nurse Communication Mobility status        Time: 4259-5638 OT Time Calculation (min): 22 min  Charges: OT General Charges $OT  Visit: 1 Visit OT Treatments $Therapeutic Activity: 8-22 mins  Lodema Hong, [place]  Office [phone]   Trixie Dredge 05/07/2022, 3:11 PM

## 2022-05-07 NOTE — TOC Transition Note (Addendum)
Transition of Care [facility]) - CM/SW Discharge Note   Patient Details  Name: Natalie West MRN: [id number] Date of Birth: [birth year]  Transition of Care [facility]) CM/SW Contact:  Zenon Mayo, RN Phone Number: 05/07/2022, 10:36 AM   Clinical Narrative:    Patient is for possible dc today, NCM notified daughter , [name], she has contacted [facility]  and they will have someone there at the house today.  NCM notified Jenn with [facility] and [place] with [facility].  She will need ambulance transport. PTAR scheduled for 12 noon.    Final next level of care: [place] Barriers to Discharge: Continued Medical Work up   Patient Goals and CMS Choice Patient states their goals for this hospitalization and ongoing recovery are:: Family wants pt to return home      Discharge Placement                       Discharge Plan and Services   Discharge Planning Services: CM Consult            DME Arranged: Gilford Rile rolling, Wheelchair manual, Other see comment Product manager) DME Agency: [employer] Date DME Agency Contacted: 05/04/22 Time DME Agency Contacted: 76 Representative spoke with at DME Agency: Letta Kocher left message HH Arranged: OT, PT, RN [place] Agency: [facility] Date [place]: 05/04/22 Time [place]: 1117 Representative spoke with at [place]: [place] ([place]) Interventions     Readmission Risk Interventions    04/16/2022   12:20 PM  Readmission Risk Prevention Plan  Transportation Screening Complete  PCP or Specialist Appt within 5-7 Days Complete  Home Care Screening Complete  Medication Review (RN CM) Complete

## 2022-05-07 NOTE — Progress Notes (Signed)
PT Note Received imminent d/c order for pt.  Noted that pt was seen yesterday 7/5 and all equipment and f/u recommendations are in chart.  Notified MD and he agreed pt does not need to be seen by PT prior to d/c.  Thanks. Tenisha Fleece M,PT Acute Rehab Services [phone]

## 2022-05-07 NOTE — Progress Notes (Signed)
Discharge instructions given to Natalie West via phone. Verbalized understanding. All questions were answered.

## 2022-05-07 NOTE — Progress Notes (Signed)
[facility]) Hospital Liaison note:  This is a pending outpatient-based Palliative Care patient. Will continue to follow for disposition.  Please call with any outpatient palliative questions or concerns.  Thank you, Lorelee Market, LPN [facility] Liaison [phone]

## 2022-05-16 ENCOUNTER — Other Ambulatory Visit: Payer: Self-pay

## 2022-05-16 ENCOUNTER — Emergency Department (HOSPITAL_COMMUNITY): Payer: Medicare HMO

## 2022-05-16 ENCOUNTER — Inpatient Hospital Stay (HOSPITAL_COMMUNITY)
Admission: EM | Admit: 2022-05-16 | Discharge: 2022-05-19 | DRG: 871 | Disposition: A | Discharge status: Home Health | Payer: Medicare HMO | Attending: Internal Medicine | Admitting: Internal Medicine

## 2022-05-16 ENCOUNTER — Encounter (HOSPITAL_COMMUNITY): Payer: Self-pay | Admitting: Internal Medicine

## 2022-05-16 DIAGNOSIS — C50511 Malignant neoplasm of lower-outer quadrant of right female breast: Secondary | ICD-10-CM | POA: Diagnosis present

## 2022-05-16 DIAGNOSIS — I5032 Chronic diastolic (congestive) heart failure: Secondary | ICD-10-CM | POA: Diagnosis present

## 2022-05-16 DIAGNOSIS — J9601 Acute respiratory failure with hypoxia: Secondary | ICD-10-CM | POA: Diagnosis not present

## 2022-05-16 DIAGNOSIS — J470 Bronchiectasis with acute lower respiratory infection: Secondary | ICD-10-CM | POA: Diagnosis present

## 2022-05-16 DIAGNOSIS — Z888 Allergy status to other drugs, medicaments and biological substances status: Secondary | ICD-10-CM

## 2022-05-16 DIAGNOSIS — Z923 Personal history of irradiation: Secondary | ICD-10-CM

## 2022-05-16 DIAGNOSIS — E785 Hyperlipidemia, unspecified: Secondary | ICD-10-CM | POA: Diagnosis present

## 2022-05-16 DIAGNOSIS — Z882 Allergy status to sulfonamides status: Secondary | ICD-10-CM

## 2022-05-16 DIAGNOSIS — L0390 Cellulitis, unspecified: Secondary | ICD-10-CM

## 2022-05-16 DIAGNOSIS — Z853 Personal history of malignant neoplasm of breast: Secondary | ICD-10-CM

## 2022-05-16 DIAGNOSIS — Z79899 Other long term (current) drug therapy: Secondary | ICD-10-CM

## 2022-05-16 DIAGNOSIS — A419 Sepsis, unspecified organism: Secondary | ICD-10-CM | POA: Diagnosis not present

## 2022-05-16 DIAGNOSIS — J189 Pneumonia, unspecified organism: Secondary | ICD-10-CM | POA: Diagnosis present

## 2022-05-16 DIAGNOSIS — Z20822 Contact with and (suspected) exposure to covid-19: Secondary | ICD-10-CM | POA: Diagnosis present

## 2022-05-16 DIAGNOSIS — K219 Gastro-esophageal reflux disease without esophagitis: Secondary | ICD-10-CM | POA: Diagnosis present

## 2022-05-16 DIAGNOSIS — R6520 Severe sepsis without septic shock: Secondary | ICD-10-CM | POA: Diagnosis present

## 2022-05-16 DIAGNOSIS — Z7901 Long term (current) use of anticoagulants: Secondary | ICD-10-CM

## 2022-05-16 DIAGNOSIS — L03312 Cellulitis of back [any part except buttock]: Secondary | ICD-10-CM | POA: Diagnosis present

## 2022-05-16 DIAGNOSIS — Z66 Do not resuscitate: Secondary | ICD-10-CM | POA: Diagnosis present

## 2022-05-16 DIAGNOSIS — L8990 Pressure ulcer of unspecified site, unspecified stage: Secondary | ICD-10-CM | POA: Diagnosis present

## 2022-05-16 DIAGNOSIS — Z881 Allergy status to other antibiotic agents status: Secondary | ICD-10-CM

## 2022-05-16 DIAGNOSIS — L89130 Pressure ulcer of right lower back, unstageable: Secondary | ICD-10-CM | POA: Diagnosis present

## 2022-05-16 DIAGNOSIS — I110 Hypertensive heart disease with heart failure: Secondary | ICD-10-CM | POA: Diagnosis present

## 2022-05-16 DIAGNOSIS — L89152 Pressure ulcer of sacral region, stage 2: Secondary | ICD-10-CM

## 2022-05-16 DIAGNOSIS — F32A Depression, unspecified: Secondary | ICD-10-CM | POA: Diagnosis present

## 2022-05-16 DIAGNOSIS — E7800 Pure hypercholesterolemia, unspecified: Secondary | ICD-10-CM | POA: Diagnosis present

## 2022-05-16 DIAGNOSIS — I10 Essential (primary) hypertension: Secondary | ICD-10-CM | POA: Diagnosis present

## 2022-05-16 DIAGNOSIS — Z885 Allergy status to narcotic agent status: Secondary | ICD-10-CM

## 2022-05-16 DIAGNOSIS — Z86711 Personal history of pulmonary embolism: Secondary | ICD-10-CM

## 2022-05-16 DIAGNOSIS — Z7984 Long term (current) use of oral hypoglycemic drugs: Secondary | ICD-10-CM

## 2022-05-16 DIAGNOSIS — Z825 Family history of asthma and other chronic lower respiratory diseases: Secondary | ICD-10-CM

## 2022-05-16 DIAGNOSIS — I482 Chronic atrial fibrillation, unspecified: Secondary | ICD-10-CM | POA: Diagnosis present

## 2022-05-16 DIAGNOSIS — Z8249 Family history of ischemic heart disease and other diseases of the circulatory system: Secondary | ICD-10-CM

## 2022-05-16 LAB — CBC WITH DIFFERENTIAL/PLATELET
Abs Immature Granulocytes: 0.54 10*3/uL — ABNORMAL HIGH (ref 0.00–0.07)
Basophils Absolute: 0.1 10*3/uL (ref 0.0–0.1)
Basophils Relative: 1 %
Eosinophils Absolute: 0.2 10*3/uL (ref 0.0–0.5)
Eosinophils Relative: 1 %
HCT: 41.9 % (ref 36.0–46.0)
Hemoglobin: 13 g/dL (ref 12.0–15.0)
Immature Granulocytes: 3 %
Lymphocytes Relative: 13 %
Lymphs Abs: 2.3 10*3/uL (ref 0.7–4.0)
MCH: 33.8 pg (ref 26.0–34.0)
MCHC: 31 g/dL (ref 30.0–36.0)
MCV: 108.8 fL — ABNORMAL HIGH (ref 80.0–100.0)
Monocytes Absolute: 1.4 10*3/uL — ABNORMAL HIGH (ref 0.1–1.0)
Monocytes Relative: 8 %
Neutro Abs: 14 10*3/uL — ABNORMAL HIGH (ref 1.7–7.7)
Neutrophils Relative %: 74 %
Platelets: 311 10*3/uL (ref 150–400)
RBC: 3.85 MIL/uL — ABNORMAL LOW (ref 3.87–5.11)
RDW: 15.9 % — ABNORMAL HIGH (ref 11.5–15.5)
WBC: 18.6 10*3/uL — ABNORMAL HIGH (ref 4.0–10.5)
nRBC: 0 % (ref 0.0–0.2)

## 2022-05-16 LAB — RESP PANEL BY RT-PCR (FLU A&B, COVID) ARPGX2
Influenza A by PCR: NEGATIVE
Influenza B by PCR: NEGATIVE
SARS Coronavirus 2 by RT PCR: NEGATIVE

## 2022-05-16 LAB — COMPREHENSIVE METABOLIC PANEL
ALT: 12 U/L (ref 0–44)
AST: 18 U/L (ref 15–41)
Albumin: 3.2 g/dL — ABNORMAL LOW (ref 3.5–5.0)
Alkaline Phosphatase: 71 U/L (ref 38–126)
Anion gap: 13 (ref 5–15)
BUN: 20 mg/dL (ref 8–23)
CO2: 19 mmol/L — ABNORMAL LOW (ref 22–32)
Calcium: 8.5 mg/dL — ABNORMAL LOW (ref 8.9–10.3)
Chloride: 106 mmol/L (ref 98–111)
Creatinine, Ser: 0.94 mg/dL (ref 0.44–1.00)
GFR, Estimated: 58 mL/min — ABNORMAL LOW (ref >60)
Glucose, Bld: 142 mg/dL — ABNORMAL HIGH (ref 70–99)
Potassium: 3.7 mmol/L (ref 3.5–5.1)
Sodium: 138 mmol/L (ref 135–145)
Total Bilirubin: 0.3 mg/dL (ref 0.3–1.2)
Total Protein: 6.4 g/dL — ABNORMAL LOW (ref 6.5–8.1)

## 2022-05-16 LAB — APTT: aPTT: 35 seconds (ref 24–36)

## 2022-05-16 LAB — LACTIC ACID, PLASMA: Lactic Acid, Venous: 3.3 mmol/L (ref 0.5–1.9)

## 2022-05-16 LAB — MAGNESIUM: Magnesium: 2.2 mg/dL (ref 1.7–2.4)

## 2022-05-16 LAB — PROTIME-INR
INR: 1.6 — ABNORMAL HIGH (ref 0.8–1.2)
Prothrombin Time: 18.8 seconds — ABNORMAL HIGH (ref 11.4–15.2)

## 2022-05-16 LAB — TROPONIN I (HIGH SENSITIVITY): Troponin I (High Sensitivity): 15 ng/L (ref <18)

## 2022-05-16 LAB — LIPASE, BLOOD: Lipase: 26 U/L (ref 11–51)

## 2022-05-16 LAB — BRAIN NATRIURETIC PEPTIDE: B Natriuretic Peptide: 148.8 pg/mL — ABNORMAL HIGH (ref 0.0–100.0)

## 2022-05-16 MED ORDER — LACTATED RINGERS IV BOLUS (SEPSIS)
500.0000 mL | Freq: Once | INTRAVENOUS | Status: AC
  Administered 2022-05-16: 500 mL via INTRAVENOUS

## 2022-05-16 MED ORDER — LACTATED RINGERS IV BOLUS (SEPSIS)
250.0000 mL | Freq: Once | INTRAVENOUS | Status: AC
  Administered 2022-05-16: 250 mL via INTRAVENOUS

## 2022-05-16 MED ORDER — ENSURE ENLIVE PO LIQD
237.0000 mL | Freq: Every day | ORAL | Status: DC
  Administered 2022-05-18 – 2022-05-19 (×2): 237 mL via ORAL

## 2022-05-16 MED ORDER — LACTATED RINGERS IV SOLN: INTRAVENOUS | Status: AC

## 2022-05-16 MED ORDER — AMITRIPTYLINE HCL 25 MG PO TABS
25.0000 mg | ORAL_TABLET | Freq: Two times a day (BID) | ORAL | Status: DC
  Administered 2022-05-17 – 2022-05-19 (×5): 25 mg via ORAL
  Filled 2022-05-16 (×5): qty 1

## 2022-05-16 MED ORDER — POLYETHYLENE GLYCOL 3350 17 G PO PACK: 17.0000 g | PACK | Freq: Every day | ORAL | Status: DC | PRN

## 2022-05-16 MED ORDER — ALBUTEROL SULFATE (2.5 MG/3ML) 0.083% IN NEBU: 2.5000 mg | INHALATION_SOLUTION | RESPIRATORY_TRACT | Status: DC | PRN

## 2022-05-16 MED ORDER — IOHEXOL 350 MG/ML SOLN
75.0000 mL | Freq: Once | INTRAVENOUS | Status: AC | PRN
  Administered 2022-05-16: 75 mL via INTRAVENOUS

## 2022-05-16 MED ORDER — PRAVASTATIN SODIUM 10 MG PO TABS
20.0000 mg | ORAL_TABLET | Freq: Every day | ORAL | Status: DC
  Administered 2022-05-17 – 2022-05-18 (×2): 20 mg via ORAL
  Filled 2022-05-16 (×2): qty 2

## 2022-05-16 MED ORDER — APIXABAN 5 MG PO TABS
5.0000 mg | ORAL_TABLET | Freq: Two times a day (BID) | ORAL | Status: DC
  Administered 2022-05-17 – 2022-05-19 (×5): 5 mg via ORAL
  Filled 2022-05-16 (×5): qty 1

## 2022-05-16 MED ORDER — LACTATED RINGERS IV BOLUS (SEPSIS)
1000.0000 mL | Freq: Once | INTRAVENOUS | Status: AC
  Administered 2022-05-16: 1000 mL via INTRAVENOUS

## 2022-05-16 MED ORDER — SODIUM CHLORIDE 0.9 % IV SOLN
2.0000 g | INTRAVENOUS | Status: DC
  Administered 2022-05-16 – 2022-05-18 (×3): 2 g via INTRAVENOUS
  Filled 2022-05-16 (×3): qty 20

## 2022-05-16 MED ORDER — PANTOPRAZOLE SODIUM 40 MG PO TBEC
40.0000 mg | DELAYED_RELEASE_TABLET | Freq: Every day | ORAL | Status: DC
  Administered 2022-05-17 – 2022-05-19 (×3): 40 mg via ORAL
  Filled 2022-05-16 (×3): qty 1

## 2022-05-16 MED ORDER — SODIUM CHLORIDE 0.9 % IV SOLN
500.0000 mg | INTRAVENOUS | Status: DC
  Administered 2022-05-16 – 2022-05-18 (×3): 500 mg via INTRAVENOUS
  Filled 2022-05-16 (×5): qty 5

## 2022-05-16 MED ORDER — METOPROLOL TARTRATE 25 MG PO TABS
25.0000 mg | ORAL_TABLET | Freq: Two times a day (BID) | ORAL | Status: DC
  Administered 2022-05-17 – 2022-05-19 (×5): 25 mg via ORAL
  Filled 2022-05-16 (×5): qty 1

## 2022-05-16 MED ORDER — MEGESTROL ACETATE 400 MG/10ML PO SUSP
400.0000 mg | Freq: Two times a day (BID) | ORAL | Status: DC
  Administered 2022-05-17 – 2022-05-19 (×5): 400 mg via ORAL
  Filled 2022-05-16 (×7): qty 10

## 2022-05-16 MED ORDER — ACETAMINOPHEN 650 MG RE SUPP: 650.0000 mg | Freq: Four times a day (QID) | RECTAL | Status: DC | PRN

## 2022-05-16 MED ORDER — SERTRALINE HCL 50 MG PO TABS
25.0000 mg | ORAL_TABLET | Freq: Every day | ORAL | Status: DC
  Administered 2022-05-17 – 2022-05-19 (×3): 25 mg via ORAL
  Filled 2022-05-16 (×3): qty 1

## 2022-05-16 MED ORDER — ACETAMINOPHEN 325 MG PO TABS: 650.0000 mg | ORAL_TABLET | Freq: Four times a day (QID) | ORAL | Status: DC | PRN

## 2022-05-16 NOTE — H&P (Signed)
History and Physical   Natalie West UMP:[id number] DOB: Mar 19, 1931 DOA: 05/16/2022  PCP: Christain Sacramento, MD   Patient coming from: Home  Chief Complaint: Dyspne, cough  HPI: Natalie West is a 88 y.o. female with medical history significant of atrial fibrillation, hypertension, depression, pulmonary embolus, hyperlipidemia, breast cancer, GERD, recent admission for pneumonia presenting with shortness of breath and cough.  She reports ongoing shortness of breath for the past 2 days which has been gradually worsening and has become severe.  As above she was recently admitted at the beginning of this month for similar presentation including pneumonia.  She has been feeling better but began to have shortness of breath again the past couple days as above.  She is also had some back pain, increasing cough.  Does have known history of PE earlier this year and is unsure if she was taking her blood thinner the last few days.  She also reports fatigue and increased pain at her back wound.  Called EMS due to her worsening symptoms and they found her to be tachypneic and tachycardic and put her on 10 L in route was able to be weaned down in the ED.  She denies fevers, chills, chest pain, abdominal pain, constipation, diarrhea, nausea, vomiting.***  ED Course: Vital signs in the ED significant for respiratory in the 20s requiring 2 to 3 L to maintain saturations, initially heart rate in the 120s improved to the 90s with initial treatments.  Lab work-up included CMP with bicarb 19, glucose 142, calcium 8.5, protein 6.4, albumin 3.2.  CBC with significant leukocytosis to 18.6.  PT and INR elevated to 18.5 and 1.6 respectively PTT normal.  Lactic acid elevated to 3.3 with repeat pending.  Troponin normal with repeat pending.  Lipase normal.  BNP mildly elevated to 148.  Respiratory panel for flu and COVID negative for both.  Urinalysis, urine culture, blood cultures pending.  Chest x-ray showed probable trace bilateral  effusions and atelectasis with stable cardiomegaly.  CT PE study showed evidence of soft tissue edema overlying the T8-T10 region suspicious for cellulitis, no pulmonary embolus, persistent groundglass opacities similar to 2 weeks ago representing atypical infection versus pulmonary edema.  Patient did have cardiomegaly and coronary calcium.  Shotty lymphadenopathy of the mediastinum.  Patient received ceftriaxone and azithromycin as well as 1.75 L of IV fluids and started on a rate of 150 cc an hour.  Review of Systems: As per HPI otherwise all other systems reviewed and are negative.  Past Medical History:  Diagnosis Date   Acute diastolic heart failure ([place]) 05/07/2021   Acute pulmonary embolism ([place]) 04/12/2022   Anxiety    Arthritis    Breast cancer ([place]) 2006   right breast   CAP (community acquired pneumonia) 05/03/2022   Chronic cough    Chronic UTI    Decubitus skin ulcer 05/04/2022   Depression    Dysrhythmia    Esophageal reflux    Esophageal stricture    Family history of malignant neoplasm of gastrointestinal tract    GERD (gastroesophageal reflux disease)    Hearing loss    Hemorrhoids    internal   History of hiatal hernia    Hypercholesterolemia    Hypertension    Hypokalemia 05/04/2022   Numbness and tingling    feeet, occasionally   Osteopenia    Personal history of colonic polyps 02/09/2007   TUBULAR ADENOMA   Personal history of radiation therapy    PONV (postoperative nausea and  vomiting)    trouble opening mouth and jaw, jaw pops, trouble turning head   Rotator cuff tear    RT   Status post dilation of esophageal narrowing    Umbilical hernia     Past Surgical History:  Procedure Laterality Date   BREAST LUMPECTOMY  2006   right lumpectomy   CATARACT EXTRACTION W/ INTRAOCULAR LENS IMPLANT Bilateral    COLONOSCOPY     CYSTOCELE REPAIR N/A 04/20/2016   Procedure: AUGMENTED ANTERIOR VAULT REPAIR, COLOPLAST DERMIS GRAFT KELLY PLICATION SACROSPINOUS FIXATION;   Surgeon: Carolan Clines, MD;  Location: [facility];  Service: Urology;  Laterality: N/A;   DILATION AND CURETTAGE OF UTERUS     ESOPHAGOGASTRODUODENOSCOPY ENDOSCOPY     PUBOVAGINAL SLING N/A 04/20/2016   Procedure: PUBO-VAGINAL [name];  Surgeon: Carolan Clines, MD;  Location: [facility];  Service: Urology;  Laterality: N/A;   REVISION URINARY SLING N/A 06/12/2016   Procedure: INCISION OF URETHRAL SLING;  Surgeon: Carolan Clines, MD;  Location: [facility];  Service: Urology;  Laterality: N/A;  1 HOUR   SHOULDER OPEN ROTATOR CUFF REPAIR Right 02/28/2013   Procedure: RIGHT ROTATOR CUFF REPAIR SHOULDER OPEN WITH GRAFT AND ANCHORS;  Surgeon: Tobi Bastos, MD;  Location: [facility];  Service: Orthopedics;  Laterality: Right;   UMBILICAL HERNIA REPAIR      Social History  reports that she has never smoked. She has never used smokeless tobacco. She reports that she does not drink alcohol and does not use drugs.  Allergies  Allergen Reactions   Cipro [Ciprofloxacin Hcl] Other (See Comments)    Unknown reaction   Cleocin [Clindamycin] Other (See Comments)    Unknown reaction   Lincocin [Lincomycin Hcl] Other (See Comments)    Unknown reaction   Codeine Nausea And Vomiting   Flexeril [Cyclobenzaprine Hcl] Other (See Comments)    Unknown reaction   Hydrocodone Other (See Comments)    "Last time it was too strong and messed up my mind"   Macrobid [Nitrofurantoin] Other (See Comments)    Unknown reaction   Nalfon [Fenoprofen Calcium] Other (See Comments)    Unknown reaction   Noroxin [Norfloxacin] Other (See Comments)    Unknown reaction    Relafen [Nabumetone] Other (See Comments)    Unknown reaction    Sulfa Antibiotics Other (See Comments)    Unknown reaction   Voltaren [Diclofenac] Other (See Comments)    Unknown reaction     Family History  Problem Relation Age of Onset   Colon cancer Mother 48   Kidney cancer Mother    Glaucoma Mother    Heart disease Sister    Stomach  cancer Brother    Stroke Sister    Lung cancer Brother    Clotting disorder Son    Emphysema Father        smoker   Hypertension Sister   Reviewed on admission  Prior to Admission medications   Medication Sig Start Date End Date Taking? Authorizing Provider  acetaminophen (TYLENOL) 325 MG tablet Take 2 tablets (650 mg total) by mouth every 6 (six) hours as needed. Patient taking differently: Take 650 mg by mouth every 6 (six) hours as needed for moderate pain or headache. 01/28/22   Maczis, Barth Kirks, PA-C  albuterol (PROVENTIL) (2.5 MG/3ML) 0.083% nebulizer solution Take 2.5 mg by nebulization every 4 (four) hours as needed for wheezing or shortness of breath. 04/30/22   [provider]  amitriptyline (ELAVIL) 25 MG tablet Take 1 tablet (25 mg total)  by mouth in the morning and at bedtime. 04/16/22   Barton Dubois, MD  apixaban (ELIQUIS) 5 MG TABS tablet Take 1 tablet (5 mg total) by mouth 2 (two) times daily. 05/07/22   Shelly Coss, MD  benzonatate (TESSALON) 100 MG capsule Take 1 capsule (100 mg total) by mouth every 8 (eight) hours. 04/24/22   Redwine, Madison A, PA-C  empagliflozin (JARDIANCE) 10 MG TABS tablet Take 1 tablet (10 mg total) by mouth daily. 05/08/22   Shelly Coss, MD  Ensure (ENSURE) Take 237 mLs by mouth daily.    [provider]  furosemide (LASIX) 20 MG tablet Take 1 tablet (20 mg total) by mouth daily as needed for fluid or edema. 04/16/22   Barton Dubois, MD  megestrol (MEGACE) 400 MG/10ML suspension Take 10 mLs (400 mg total) by mouth 2 (two) times daily. 04/16/22 06/15/22  Barton Dubois, MD  metoprolol tartrate (LOPRESSOR) 25 MG tablet Take 1 tablet (25 mg total) by mouth 2 (two) times daily. 05/07/22   Shelly Coss, MD  mirtazapine (REMERON) 15 MG tablet Take 1 tablet (15 mg total) by mouth at bedtime. 04/16/22   Barton Dubois, MD  Multiple Vitamin (MULTIVITAMIN WITH MINERALS) TABS tablet Take 1 tablet by mouth daily. Centrum Silver for Women     [provider]  pantoprazole (PROTONIX) 40 MG tablet Take 1 tablet (40 mg total) by mouth daily. 1 Tablet Daily for Heartburn and Acid Reflux 04/16/22   Barton Dubois, MD  polyethylene glycol (MIRALAX / GLYCOLAX) 17 g packet Take 17 g by mouth 2 (two) times daily as needed for mild constipation. Patient not taking: Reported on 05/04/2022 01/28/22   Maczis, Barth Kirks, PA-C  potassium chloride (KLOR-CON M) 10 MEQ tablet Take 1 tablet (10 mEq total) by mouth daily. 05/08/22   Shelly Coss, MD  pravastatin (PRAVACHOL) 20 MG tablet Take 20 mg by mouth at bedtime.     [provider]  senna-docusate (SENOKOT-S) 8.6-50 MG tablet Take 1 tablet by mouth 2 (two) times daily as needed for mild constipation. Patient not taking: Reported on 05/04/2022 01/28/22   Jillyn Ledger, PA-C  sertraline (ZOLOFT) 25 MG tablet Take 1 tablet (25 mg total) by mouth daily. 04/16/22   Barton Dubois, MD    Physical Exam: Vitals:   05/16/22 2145 05/16/22 2200 05/16/22 2215 05/16/22 2247  BP: 107/78 113/75 117/72 115/73  Pulse: (!) 106 97 99 96  Resp: (!) 22 (!) 21 (!) 21 (!) 27  Temp:      TempSrc:      SpO2: 99% 93% 100% 96%    Physical Exam Constitutional:      General: She is not in acute distress.    Appearance: Normal appearance.  HENT:     Head: Normocephalic and atraumatic.     Mouth/Throat:     Mouth: Mucous membranes are moist.     Pharynx: Oropharynx is clear.  Eyes:     Extraocular Movements: Extraocular movements intact.     Pupils: Pupils are equal, round, and reactive to light.  Cardiovascular:     Rate and Rhythm: Normal rate and regular rhythm.     Pulses: Normal pulses.     Heart sounds: Normal heart sounds.  Pulmonary:     Effort: Pulmonary effort is normal. No respiratory distress.     Breath sounds: Rhonchi present.  Abdominal:     General: Bowel sounds are normal. There is no distension.     Palpations: Abdomen is soft.  Tenderness: There is no abdominal  tenderness.  Musculoskeletal:        General: No swelling or deformity.  Skin:    General: Skin is warm and dry.  Neurological:     General: No focal deficit present.     Mental Status: Mental status is at baseline.   ***  Labs on Admission: I have personally reviewed following labs and imaging studies  CBC: Recent Labs  Lab 05/16/22 2051  WBC 18.6*  NEUTROABS 14.0*  HGB 13.0  HCT 41.9  MCV 108.8*  PLT 161    Basic Metabolic Panel: Recent Labs  Lab 05/16/22 2051  NA 138  K 3.7  CL 106  CO2 19*  GLUCOSE 142*  BUN 20  CREATININE 0.94  CALCIUM 8.5*  MG 2.2    GFR: Estimated Creatinine Clearance: 32.7 mL/min (by C-G formula based on SCr of 0.94 mg/dL).  Liver Function Tests: Recent Labs  Lab 05/16/22 2051  AST 18  ALT 12  ALKPHOS 71  BILITOT 0.3  PROT 6.4*  ALBUMIN 3.2*    Urine analysis:    Component Value Date/Time   COLORURINE YELLOW 04/12/2022 1641   APPEARANCEUR HAZY (A) 04/12/2022 1641   LABSPEC 1.014 04/12/2022 1641   PHURINE 5.0 04/12/2022 1641   GLUCOSEU NEGATIVE 04/12/2022 1641   HGBUR SMALL (A) 04/12/2022 1641   BILIRUBINUR NEGATIVE 04/12/2022 1641   BILIRUBINUR n 02/18/2015 1404   KETONESUR NEGATIVE 04/12/2022 1641   PROTEINUR NEGATIVE 04/12/2022 1641   UROBILINOGEN negative 02/18/2015 1404   UROBILINOGEN 0.2 02/28/2013 0739   NITRITE NEGATIVE 04/12/2022 1641   LEUKOCYTESUR MODERATE (A) 04/12/2022 1641    Radiological Exams on Admission: CT Angio Chest PE W and/or Wo Contrast  Addendum Date: 05/16/2022   ADDENDUM REPORT: 05/16/2022 23:23 ADDENDUM: This addendum is to add an additional findings/impression. There is skin and soft tissues thickening overlying the left posterior aspect of T8 through T10 vertebral body and spinous processes. No well-defined or peripherally enhancing collection to suggest abscess. No soft tissue gas. There is no associated bony destructive change to suggest osteomyelitis. Suspect cellulitis.  Electronically Signed   By: Keith Rake M.D.   On: 05/16/2022 23:23   Result Date: 05/16/2022 CLINICAL DATA:  Pulmonary embolism (PE) suspected, high prob Prior pulm embolism, says she may not taken her blood thinners this week. Also cough and chills with recent pneumonia. Also erythematous area on her back concerning for infection EXAM: CT ANGIOGRAPHY CHEST WITH CONTRAST TECHNIQUE: Multidetector CT imaging of the chest was performed using the standard protocol during bolus administration of intravenous contrast. Multiplanar CT image reconstructions and MIPs were obtained to evaluate the vascular anatomy. RADIATION DOSE REDUCTION: This exam was performed according to the departmental dose-optimization program which includes automated exposure control, adjustment of the mA and/or kV according to patient size and/or use of iterative reconstruction technique. CONTRAST:  33m OMNIPAQUE IOHEXOL 350 MG/ML SOLN COMPARISON:  Radiograph earlier today. Chest CTA 2 weeks ago 05/02/2022, also 04/12/2022 FINDINGS: Cardiovascular: No filling defects in the pulmonary arteries to suggest pulmonary embolus. Dilated main right pulmonary artery at 3.8 cm, mild dilated left pulmonary artery at 2.7 cm. Aortic atherosclerosis and tortuosity. Cannot assess for dissection given phase of contrast tailored to pulmonary arteries ses mint multi chamber cardiomegaly. There are coronary artery calcifications no pericardial effusion. Mediastinum/Nodes: Shotty and mildly enlarged mediastinal and hilar lymph nodes, largest in the subcarinal station measuring 11 mm. Decompressed esophagus. Lungs/Pleura: Again seen heterogeneous areas of ground-glass opacity and septal thickening with  a slight upper lobe predominant distribution. Similar small bilateral pleural effusions to prior exam. Again seen areas of subpleural reticulation and traction bronchiectasis. There is prominent central bronchial thickening areas of mucoid impaction in the lower  lobes. Upper Abdomen: No acute findings. Small cyst in the upper right kidney, needs no further follow-up. Musculoskeletal: Exaggerated thoracic kyphosis. Stable bone island within T1 vertebral body. Subacute anterior rib fractures. No acute osseous findings. Review of the MIP images confirms the above findings. IMPRESSION: 1. No pulmonary embolus. 2. Persistent heterogeneous areas of ground-glass opacity and septal thickening with a slight upper lobe predominant distribution. This is without significant interval change from exam 2 weeks ago. Differential considerations include pulmonary edema versus atypical infection or interstitial lung disease. 3. Small bilateral pleural effusions. 4. Prominent central bronchial thickening with areas of mucoid impaction in the lower lobes. 5. Cardiomegaly with coronary artery calcifications. Dilated main pulmonary artery suggesting pulmonary arterial hypertension. 6. Shotty and mildly enlarged mediastinal and hilar lymph nodes are likely reactive. Aortic Atherosclerosis (ICD10-I70.0). Electronically Signed: By: Keith Rake M.D. On: 05/16/2022 23:14   DG Chest Port 1 View  Result Date: 05/16/2022 CLINICAL DATA:  Questionable sepsis. EXAM: PORTABLE CHEST 1 VIEW COMPARISON:  Chest radiograph dated 05/02/2022. FINDINGS: Diffuse chronic truck coarsening. No consolidative changes. Probable trace bilateral pleural effusions and bibasilar atelectasis. No pneumothorax. Stable cardiomegaly. Atherosclerotic calcification of the aorta. No acute osseous pathology. IMPRESSION: 1. Probable trace bilateral pleural effusions and bibasilar atelectasis. 2. Stable cardiomegaly. Electronically Signed   By: Anner Crete M.D.   On: 05/16/2022 21:49    EKG: Ordered in the ED but not yet performed.  Assessment/Plan Active Problems:   Atrial fibrillation, chronic (HCC)   Essential hypertension   Depression   History of pulmonary embolism   Hyperlipidemia   GERD   Breast cancer of  lower-outer quadrant of right female breast ([place])   CAP (community acquired pneumonia)   Acute respiratory failure with hypoxia Pneumonia > Patient presenting with worsening cough and shortness of breath for the past couple of days becoming severe today. > Noted to have leukocytosis to 8.1.5 and recently treated for pneumonia with similar changes on CT chest as 2 weeks ago. > Does have history of pulmonary embolus this year on anticoagulation but was unsure if she has been taking it for the last few days.  No PE on CT chest. > A degree of edema is on the differential for groundglass opacities and she does have mildly elevated BNP however given her elevated lactic acid and concern for developing sepsis patient received fluid bolus in the ED and remains on IV fluids. - Monitor on telemetry - Continue with ceftriaxone and azithromycin as started in the ED - Continue with supplemental oxygen, wean as tolerated - Trend fever curve and WBC  Cellulitis Pressure injury > Noted to have chronic pressure injury at low back.  Has changes consistent with cellulitis, and CT findings consistent with soft tissue swelling overlying the region from T8-T10. - On ceftriaxone as above  Atrial fibrillation - Continue home metoprolol and Eliquis  Hypertension - Continue home metoprolol but otherwise monitoring for worsening blood pressure in the setting of concern for developing sepsis  History of pulmonary embolus - Continue home Eliquis  Hyperlipidemia - Continue home pravastatin  GERD - Continue home PPI  History of breast cancer - Noted  ?CHF > There is some possible CHF concern during recent admission.  Echo showed EF 55-60%, indeterminate diastolic function and normal RV function.  Is currently prescribed Lasix only as needed. -Continue to monitor  DVT prophylaxis: Eliquis Code Status:   DNR*** Family Communication:  ***  Disposition Plan:   Patient is from:  Home  Anticipated DC  to:  Home  Anticipated DC date:  1 to 4 days  Anticipated DC barriers: None  Consults called:  None Admission status:  Observation, telemetry  Severity of Illness: The appropriate patient status for this patient is OBSERVATION. Observation status is judged to be reasonable and necessary in order to provide the required intensity of service to ensure the patient's safety. The patient's presenting symptoms, physical exam findings, and initial radiographic and laboratory data in the context of their medical condition is felt to place them at decreased risk for further clinical deterioration. Furthermore, it is anticipated that the patient will be medically stable for discharge from the hospital within 2 midnights of admission.    Marcelyn Bruins MD [facility]  How to contact the [facility] Attending or Consulting provider [place] or covering provider during after hours [place], for this patient?   Check the care team in [facility] and look for a) attending/consulting [facility] provider listed and b) the [facility] team listed Log into [email or web address] and use [place]'s universal password to access. If you do not have the password, please contact the hospital operator. Locate the [facility] provider you are looking for under [facility] and page to a number that you can be directly reached. If you still have difficulty reaching the provider, please page the [facility] (Director on Call) for the Hospitalists listed on amion for assistance.  05/16/2022, 11:58 PM

## 2022-05-16 NOTE — ED Provider Notes (Signed)
[facility] EMERGENCY DEPARTMENT Provider Note   CSN: [id number] Arrival date & time: 05/16/22  2042     History  No chief complaint on file.   Natalie West is a 88 y.o. female.  The history is provided by the patient and medical records. No language interpreter was used.  Shortness of Breath Severity:  Severe Onset quality:  Gradual Duration:  2 days Timing:  Constant Progression:  Worsening Chronicity:  Recurrent Relieved by:  Nothing Worsened by:  Coughing Ineffective treatments:  None tried Associated symptoms: cough, diaphoresis and sputum production   Associated symptoms: no abdominal pain, no chest pain, no fever (chils), no headaches, no neck pain, no rash, no vomiting and no wheezing   Risk factors: hx of PE/DVT        Home Medications Prior to Admission medications   Medication Sig Start Date End Date Taking? Authorizing Provider  acetaminophen (TYLENOL) 325 MG tablet Take 2 tablets (650 mg total) by mouth every 6 (six) hours as needed. Patient taking differently: Take 650 mg by mouth every 6 (six) hours as needed for moderate pain or headache. 01/28/22   Maczis, Barth Kirks, PA-C  albuterol (PROVENTIL) (2.5 MG/3ML) 0.083% nebulizer solution Take 2.5 mg by nebulization every 4 (four) hours as needed for wheezing or shortness of breath. 04/30/22   [provider]  amitriptyline (ELAVIL) 25 MG tablet Take 1 tablet (25 mg total) by mouth in the morning and at bedtime. 04/16/22   Barton Dubois, MD  apixaban (ELIQUIS) 5 MG TABS tablet Take 1 tablet (5 mg total) by mouth 2 (two) times daily. 05/07/22   Shelly Coss, MD  benzonatate (TESSALON) 100 MG capsule Take 1 capsule (100 mg total) by mouth every 8 (eight) hours. 04/24/22   Redwine, Madison A, PA-C  empagliflozin (JARDIANCE) 10 MG TABS tablet Take 1 tablet (10 mg total) by mouth daily. 05/08/22   Shelly Coss, MD  Ensure (ENSURE) Take 237 mLs by mouth daily.    [provider]   furosemide (LASIX) 20 MG tablet Take 1 tablet (20 mg total) by mouth daily as needed for fluid or edema. 04/16/22   Barton Dubois, MD  megestrol (MEGACE) 400 MG/10ML suspension Take 10 mLs (400 mg total) by mouth 2 (two) times daily. 04/16/22 06/15/22  Barton Dubois, MD  metoprolol tartrate (LOPRESSOR) 25 MG tablet Take 1 tablet (25 mg total) by mouth 2 (two) times daily. 05/07/22   Shelly Coss, MD  mirtazapine (REMERON) 15 MG tablet Take 1 tablet (15 mg total) by mouth at bedtime. 04/16/22   Barton Dubois, MD  Multiple Vitamin (MULTIVITAMIN WITH MINERALS) TABS tablet Take 1 tablet by mouth daily. Centrum Silver for Women    [provider]  pantoprazole (PROTONIX) 40 MG tablet Take 1 tablet (40 mg total) by mouth daily. 1 Tablet Daily for Heartburn and Acid Reflux 04/16/22   Barton Dubois, MD  polyethylene glycol (MIRALAX / GLYCOLAX) 17 g packet Take 17 g by mouth 2 (two) times daily as needed for mild constipation. Patient not taking: Reported on 05/04/2022 01/28/22   Maczis, Barth Kirks, PA-C  potassium chloride (KLOR-CON M) 10 MEQ tablet Take 1 tablet (10 mEq total) by mouth daily. 05/08/22   Shelly Coss, MD  pravastatin (PRAVACHOL) 20 MG tablet Take 20 mg by mouth at bedtime.     [provider]  senna-docusate (SENOKOT-S) 8.6-50 MG tablet Take 1 tablet by mouth 2 (two) times daily as needed for mild constipation. Patient not taking:  Reported on 05/04/2022 01/28/22   Jillyn Ledger, PA-C  sertraline (ZOLOFT) 25 MG tablet Take 1 tablet (25 mg total) by mouth daily. 04/16/22   Barton Dubois, MD      Allergies    Cipro [ciprofloxacin hcl], Cleocin [clindamycin], Lincocin [lincomycin hcl], Codeine, Flexeril [cyclobenzaprine hcl], Hydrocodone, Macrobid [nitrofurantoin], Nalfon [fenoprofen calcium], Noroxin [norfloxacin], Relafen [nabumetone], Sulfa antibiotics, and Voltaren [diclofenac]    Review of Systems   Review of Systems  Constitutional:  Positive for chills, diaphoresis  and fatigue. Negative for fever (chils).  HENT:  Positive for congestion.   Respiratory:  Positive for cough, sputum production and shortness of breath. Negative for chest tightness and wheezing.   Cardiovascular:  Negative for chest pain, palpitations and leg swelling.  Gastrointestinal:  Negative for abdominal pain, constipation, diarrhea, nausea and vomiting.  Genitourinary:  Negative for dysuria and flank pain.  Musculoskeletal:  Positive for back pain. Negative for neck pain and neck stiffness.  Skin:  Negative for rash and wound.  Neurological:  Negative for light-headedness and headaches.  Psychiatric/Behavioral:  Negative for agitation and confusion.   All other systems reviewed and are negative.   Physical Exam Updated Vital Signs BP 115/73   Pulse 96   Temp 99.2 F (37.3 C) (Rectal)   Resp (!) 27   LMP 11/03/1983   SpO2 96%  Physical Exam Vitals and nursing note reviewed.  Constitutional:      General: She is not in acute distress.    Appearance: She is well-developed. She is not ill-appearing, toxic-appearing or diaphoretic.  HENT:     Head: Normocephalic and atraumatic.  Eyes:     Conjunctiva/sclera: Conjunctivae normal.     Pupils: Pupils are equal, round, and reactive to light.  Cardiovascular:     Rate and Rhythm: Regular rhythm. Tachycardia present.     Heart sounds: No murmur heard. Pulmonary:     Effort: Pulmonary effort is normal. Tachypnea present. No respiratory distress.     Breath sounds: Rhonchi present. No wheezing or rales.  Chest:     Chest wall: No tenderness.  Abdominal:     Palpations: Abdomen is soft.     Tenderness: There is no abdominal tenderness.  Musculoskeletal:        General: No swelling.     Cervical back: Neck supple.     Right lower leg: No tenderness. No edema.     Left lower leg: No tenderness. No edema.     Comments: Patient has an area of erythema and tenderness on her mid back that was covered with a bandage.  It was  removed.  No purulence seen but there is surrounding erythema and tenderness.  No crepitance or fluctuance.  Skin:    General: Skin is warm and dry.     Capillary Refill: Capillary refill takes less than 2 seconds.     Findings: Erythema present.  Neurological:     General: No focal deficit present.     Mental Status: She is alert.  Psychiatric:        Mood and Affect: Mood normal.     ED Results / Procedures / Treatments   Labs (all labs ordered are listed, but only abnormal results are displayed) Labs Reviewed  CBC WITH DIFFERENTIAL/PLATELET - Abnormal; Notable for the following components:      Result Value   WBC 18.6 (*)    RBC 3.85 (*)    MCV 108.8 (*)    RDW 15.9 (*)    Neutro  Abs 14.0 (*)    Monocytes Absolute 1.4 (*)    Abs Immature Granulocytes 0.54 (*)    All other components within normal limits  COMPREHENSIVE METABOLIC PANEL - Abnormal; Notable for the following components:   CO2 19 (*)    Glucose, Bld 142 (*)    Calcium 8.5 (*)    Total Protein 6.4 (*)    Albumin 3.2 (*)    GFR, Estimated 58 (*)    All other components within normal limits  LACTIC ACID, PLASMA - Abnormal; Notable for the following components:   Lactic Acid, Venous 3.3 (*)    All other components within normal limits  BRAIN NATRIURETIC PEPTIDE - Abnormal; Notable for the following components:   B Natriuretic Peptide 148.8 (*)    All other components within normal limits  PROTIME-INR - Abnormal; Notable for the following components:   Prothrombin Time 18.8 (*)    INR 1.6 (*)    All other components within normal limits  RESP PANEL BY RT-PCR (FLU A&B, COVID) ARPGX2  URINE CULTURE  CULTURE, BLOOD (ROUTINE X 2)  CULTURE, BLOOD (ROUTINE X 2)  LIPASE, BLOOD  MAGNESIUM  APTT  LACTIC ACID, PLASMA  URINALYSIS, ROUTINE W REFLEX MICROSCOPIC  TROPONIN I (HIGH SENSITIVITY)  TROPONIN I (HIGH SENSITIVITY)    EKG None  Radiology CT Angio Chest PE W and/or Wo Contrast  Addendum Date:  05/16/2022   ADDENDUM REPORT: 05/16/2022 23:23 ADDENDUM: This addendum is to add an additional findings/impression. There is skin and soft tissues thickening overlying the left posterior aspect of T8 through T10 vertebral body and spinous processes. No well-defined or peripherally enhancing collection to suggest abscess. No soft tissue gas. There is no associated bony destructive change to suggest osteomyelitis. Suspect cellulitis. Electronically Signed   By: Keith Rake M.D.   On: 05/16/2022 23:23   Result Date: 05/16/2022 CLINICAL DATA:  Pulmonary embolism (PE) suspected, high prob Prior pulm embolism, says she may not taken her blood thinners this week. Also cough and chills with recent pneumonia. Also erythematous area on her back concerning for infection EXAM: CT ANGIOGRAPHY CHEST WITH CONTRAST TECHNIQUE: Multidetector CT imaging of the chest was performed using the standard protocol during bolus administration of intravenous contrast. Multiplanar CT image reconstructions and MIPs were obtained to evaluate the vascular anatomy. RADIATION DOSE REDUCTION: This exam was performed according to the departmental dose-optimization program which includes automated exposure control, adjustment of the mA and/or kV according to patient size and/or use of iterative reconstruction technique. CONTRAST:  58m OMNIPAQUE IOHEXOL 350 MG/ML SOLN COMPARISON:  Radiograph earlier today. Chest CTA 2 weeks ago 05/02/2022, also 04/12/2022 FINDINGS: Cardiovascular: No filling defects in the pulmonary arteries to suggest pulmonary embolus. Dilated main right pulmonary artery at 3.8 cm, mild dilated left pulmonary artery at 2.7 cm. Aortic atherosclerosis and tortuosity. Cannot assess for dissection given phase of contrast tailored to pulmonary arteries ses mint multi chamber cardiomegaly. There are coronary artery calcifications no pericardial effusion. Mediastinum/Nodes: Shotty and mildly enlarged mediastinal and hilar lymph  nodes, largest in the subcarinal station measuring 11 mm. Decompressed esophagus. Lungs/Pleura: Again seen heterogeneous areas of ground-glass opacity and septal thickening with a slight upper lobe predominant distribution. Similar small bilateral pleural effusions to prior exam. Again seen areas of subpleural reticulation and traction bronchiectasis. There is prominent central bronchial thickening areas of mucoid impaction in the lower lobes. Upper Abdomen: No acute findings. Small cyst in the upper right kidney, needs no further follow-up. Musculoskeletal: Exaggerated thoracic kyphosis. Stable  bone island within T1 vertebral body. Subacute anterior rib fractures. No acute osseous findings. Review of the MIP images confirms the above findings. IMPRESSION: 1. No pulmonary embolus. 2. Persistent heterogeneous areas of ground-glass opacity and septal thickening with a slight upper lobe predominant distribution. This is without significant interval change from exam 2 weeks ago. Differential considerations include pulmonary edema versus atypical infection or interstitial lung disease. 3. Small bilateral pleural effusions. 4. Prominent central bronchial thickening with areas of mucoid impaction in the lower lobes. 5. Cardiomegaly with coronary artery calcifications. Dilated main pulmonary artery suggesting pulmonary arterial hypertension. 6. Shotty and mildly enlarged mediastinal and hilar lymph nodes are likely reactive. Aortic Atherosclerosis (ICD10-I70.0). Electronically Signed: By: Keith Rake M.D. On: 05/16/2022 23:14   DG Chest Port 1 View  Result Date: 05/16/2022 CLINICAL DATA:  Questionable sepsis. EXAM: PORTABLE CHEST 1 VIEW COMPARISON:  Chest radiograph dated 05/02/2022. FINDINGS: Diffuse chronic truck coarsening. No consolidative changes. Probable trace bilateral pleural effusions and bibasilar atelectasis. No pneumothorax. Stable cardiomegaly. Atherosclerotic calcification of the aorta. No acute  osseous pathology. IMPRESSION: 1. Probable trace bilateral pleural effusions and bibasilar atelectasis. 2. Stable cardiomegaly. Electronically Signed   By: Anner Crete M.D.   On: 05/16/2022 21:49    Procedures Procedures    CRITICAL CARE Performed by: Gwenyth Allegra Patty Leitzke Total critical care time: 35 minutes Critical care time was exclusive of separately billable procedures and treating other patients. Critical care was necessary to treat or prevent imminent or life-threatening deterioration. Critical care was time spent personally by me on the following activities: development of treatment plan with patient and/or surrogate as well as nursing, discussions with consultants, evaluation of patient's response to treatment, examination of patient, obtaining history from patient or surrogate, ordering and performing treatments and interventions, ordering and review of laboratory studies, ordering and review of radiographic studies, pulse oximetry and re-evaluation of patient's condition.   Medications Ordered in ED Medications  lactated ringers infusion ( Intravenous Rate/Dose Verify 05/16/22 2315)  cefTRIAXone (ROCEPHIN) 2 g in sodium chloride 0.9 % 100 mL IVPB (0 g Intravenous Stopped 05/16/22 2215)  azithromycin (ZITHROMAX) 500 mg in sodium chloride 0.9 % 250 mL IVPB (0 mg Intravenous Stopped 05/16/22 2253)  lactated ringers bolus 1,000 mL (0 mLs Intravenous Stopped 05/16/22 2251)    And  lactated ringers bolus 500 mL (500 mLs Intravenous New Bag/Given 05/16/22 2252)    And  lactated ringers bolus 250 mL (250 mLs Intravenous New Bag/Given 05/16/22 2253)  iohexol (OMNIPAQUE) 350 MG/ML injection 75 mL (75 mLs Intravenous Contrast Given 05/16/22 2255)    ED Course/ Medical Decision Making/ A&P                           Medical Decision Making Amount and/or Complexity of Data Reviewed Labs: ordered. Radiology: ordered. ECG/medicine tests: ordered.  Risk Prescription drug  management.    CHONG JANUARY is a 88 y.o. female with a past medical history significant for recent pulmonary embolism, recent pneumonia, CHF, atrial fibrillation, hypercholesterolemia, previous diverticulitis, GERD, and pressure injury of her skin who presents with shortness of breath, pain in her back, and worsened cough.  According to EMS, she was brought in for worsening shortness of breath.  Patient does say she been coughing more and was found by EMS to be tachycardic with heart rate into the 130s.  They were concerned with her tachypnea and tachycardia and warm to the touch that she could be septic.  They report that fire placed her on 10 L nasal cannula to obtain oxygen saturations although no one described any actual hypoxia.  Patient reportedly has a concentrator at home but does not use it frequently.  They report that she was recently switched to a different antibiotic for some type of infection but they do not know further.  Patient is reporting she is feeling fatigued and tired, is having worsened cough and shortness of breath, and is complaining of more pain on a wound on her back.  She is denying new leg pain or leg swelling, denies any constipation or diarrhea, and denies urinary changes.  She denies any abdominal pain or chest pain.  She says that she is unsure if she is been taking her anticoagulation medication for the last week or so.  Patient does state that she had chest pain during her previous PE and is not having that now.  On exam, she has rhonchi.  She is quite tachypneic and tachycardic and is warm to the touch although rectal temp was only 99.2.  She had a bandage on her back that shows what is evidence of a pressure wound with surrounding erythema and tenderness.  There is no fluctuance however this is concerning for infection here.  Given the patient's 2 concerning vital signs and near fever with evidence of cellulitis and exam concerning for pneumonia, will make her a code  sepsis and give her antibiotics.  We will still get a PE study that we will also look at some of the soft tissue on the back given her vital signs and report that she may not have been taking her blood thinners for the last week.  Anticipate admission for further management.  11:27 PM CT scan does show evidence of persistent opacities in the lungs concerning for pneumonia.  No PE seen.  I viewed the images myself and I am concerned about some cellulitis near the vertebrae on the back where she is having the erythema and tenderness and wound.  I spoke to radiology who reviewed the images again and agree that there is evidence of cellulitis but no evidence of osteomyelitis at this time.  Labs began to return with a leukocytosis, elevated lactic acid, and BNP slightly more elevated at 148.  Troponin negative.  COVID and flu negative.   Patient will be admitted for sepsis with a likely source of pneumonia with possible cellulitis as well.        Final Clinical Impression(s) / ED Diagnoses Final diagnoses:  Sepsis with acute organ dysfunction, due to unspecified organism, unspecified type, unspecified whether septic shock present ([place])  Community acquired pneumonia, unspecified laterality  Cellulitis of back except buttock    Clinical Impression: 1. Sepsis with acute organ dysfunction, due to unspecified organism, unspecified type, unspecified whether septic shock present ([place])   2. Community acquired pneumonia, unspecified laterality   3. Cellulitis of back except buttock     Disposition: Admit  This note was prepared with assistance of Dragon voice recognition software. Occasional wrong-word or sound-a-like substitutions may have occurred due to the inherent limitations of voice recognition software.         Ernest Orr, Gwenyth Allegra, MD 05/16/22 2330

## 2022-05-16 NOTE — ED Triage Notes (Signed)
Pt arrived from home via [facility] for shob. Per Ems pt has "sepsis" and has been on an antibiotic, and just recently switched to another one. Pt was recently hospitalized and d/c on 7/6. Per EMS they had her on 10L O2 due to RR in the 40s, SpO2 has been 100% on 4L. 130/81, 120HR, cbg 170

## 2022-05-16 NOTE — ED Notes (Signed)
Patient transported to CT 

## 2022-05-16 NOTE — Progress Notes (Signed)
Pt being followed by [facility] for Sepsis protocol. 

## 2022-05-17 DIAGNOSIS — Z853 Personal history of malignant neoplasm of breast: Secondary | ICD-10-CM | POA: Diagnosis not present

## 2022-05-17 DIAGNOSIS — Z20822 Contact with and (suspected) exposure to covid-19: Secondary | ICD-10-CM | POA: Diagnosis present

## 2022-05-17 DIAGNOSIS — Z882 Allergy status to sulfonamides status: Secondary | ICD-10-CM | POA: Diagnosis not present

## 2022-05-17 DIAGNOSIS — Z66 Do not resuscitate: Secondary | ICD-10-CM | POA: Diagnosis present

## 2022-05-17 DIAGNOSIS — E7800 Pure hypercholesterolemia, unspecified: Secondary | ICD-10-CM | POA: Diagnosis present

## 2022-05-17 DIAGNOSIS — Z8249 Family history of ischemic heart disease and other diseases of the circulatory system: Secondary | ICD-10-CM | POA: Diagnosis not present

## 2022-05-17 DIAGNOSIS — Z888 Allergy status to other drugs, medicaments and biological substances status: Secondary | ICD-10-CM | POA: Diagnosis not present

## 2022-05-17 DIAGNOSIS — J189 Pneumonia, unspecified organism: Secondary | ICD-10-CM | POA: Diagnosis present

## 2022-05-17 DIAGNOSIS — F32A Depression, unspecified: Secondary | ICD-10-CM | POA: Diagnosis present

## 2022-05-17 DIAGNOSIS — R6520 Severe sepsis without septic shock: Secondary | ICD-10-CM | POA: Diagnosis present

## 2022-05-17 DIAGNOSIS — I5032 Chronic diastolic (congestive) heart failure: Secondary | ICD-10-CM | POA: Diagnosis present

## 2022-05-17 DIAGNOSIS — J9601 Acute respiratory failure with hypoxia: Secondary | ICD-10-CM | POA: Diagnosis present

## 2022-05-17 DIAGNOSIS — J470 Bronchiectasis with acute lower respiratory infection: Secondary | ICD-10-CM | POA: Diagnosis present

## 2022-05-17 DIAGNOSIS — Z881 Allergy status to other antibiotic agents status: Secondary | ICD-10-CM | POA: Diagnosis not present

## 2022-05-17 DIAGNOSIS — K219 Gastro-esophageal reflux disease without esophagitis: Secondary | ICD-10-CM | POA: Diagnosis present

## 2022-05-17 DIAGNOSIS — I482 Chronic atrial fibrillation, unspecified: Secondary | ICD-10-CM | POA: Diagnosis present

## 2022-05-17 DIAGNOSIS — L89130 Pressure ulcer of right lower back, unstageable: Secondary | ICD-10-CM | POA: Diagnosis present

## 2022-05-17 DIAGNOSIS — A419 Sepsis, unspecified organism: Secondary | ICD-10-CM | POA: Diagnosis present

## 2022-05-17 DIAGNOSIS — Z885 Allergy status to narcotic agent status: Secondary | ICD-10-CM | POA: Diagnosis not present

## 2022-05-17 DIAGNOSIS — Z86711 Personal history of pulmonary embolism: Secondary | ICD-10-CM | POA: Diagnosis not present

## 2022-05-17 DIAGNOSIS — L03312 Cellulitis of back [any part except buttock]: Secondary | ICD-10-CM | POA: Diagnosis present

## 2022-05-17 DIAGNOSIS — Z825 Family history of asthma and other chronic lower respiratory diseases: Secondary | ICD-10-CM | POA: Diagnosis not present

## 2022-05-17 DIAGNOSIS — I110 Hypertensive heart disease with heart failure: Secondary | ICD-10-CM | POA: Diagnosis present

## 2022-05-17 DIAGNOSIS — Z923 Personal history of irradiation: Secondary | ICD-10-CM | POA: Diagnosis not present

## 2022-05-17 LAB — URINALYSIS, ROUTINE W REFLEX MICROSCOPIC
Bilirubin Urine: NEGATIVE
Glucose, UA: >=500 mg/dL — AB
Ketones, ur: NEGATIVE mg/dL
Nitrite: NEGATIVE
Protein, ur: NEGATIVE mg/dL
Specific Gravity, Urine: 1.04 — ABNORMAL HIGH (ref 1.005–1.030)
WBC, UA: >50 WBC/hpf — ABNORMAL HIGH (ref 0–5)
pH: 7 (ref 5.0–8.0)

## 2022-05-17 LAB — CBC
HCT: 33.9 % — ABNORMAL LOW (ref 36.0–46.0)
Hemoglobin: 10.4 g/dL — ABNORMAL LOW (ref 12.0–15.0)
MCH: 32.8 pg (ref 26.0–34.0)
MCHC: 30.7 g/dL (ref 30.0–36.0)
MCV: 106.9 fL — ABNORMAL HIGH (ref 80.0–100.0)
Platelets: 233 10*3/uL (ref 150–400)
RBC: 3.17 MIL/uL — ABNORMAL LOW (ref 3.87–5.11)
RDW: 15.9 % — ABNORMAL HIGH (ref 11.5–15.5)
WBC: 17.8 10*3/uL — ABNORMAL HIGH (ref 4.0–10.5)
nRBC: 0 % (ref 0.0–0.2)

## 2022-05-17 LAB — COMPREHENSIVE METABOLIC PANEL
ALT: 11 U/L (ref 0–44)
AST: 16 U/L (ref 15–41)
Albumin: 2.7 g/dL — ABNORMAL LOW (ref 3.5–5.0)
Alkaline Phosphatase: 59 U/L (ref 38–126)
Anion gap: 13 (ref 5–15)
BUN: 17 mg/dL (ref 8–23)
CO2: 20 mmol/L — ABNORMAL LOW (ref 22–32)
Calcium: 8 mg/dL — ABNORMAL LOW (ref 8.9–10.3)
Chloride: 108 mmol/L (ref 98–111)
Creatinine, Ser: 0.82 mg/dL (ref 0.44–1.00)
GFR, Estimated: >60 mL/min (ref >60)
Glucose, Bld: 94 mg/dL (ref 70–99)
Potassium: 3.7 mmol/L (ref 3.5–5.1)
Sodium: 141 mmol/L (ref 135–145)
Total Bilirubin: 0.4 mg/dL (ref 0.3–1.2)
Total Protein: 5.7 g/dL — ABNORMAL LOW (ref 6.5–8.1)

## 2022-05-17 LAB — LACTIC ACID, PLASMA
Lactic Acid, Venous: 6.2 mmol/L (ref 0.5–1.9)
Lactic Acid, Venous: 2.7 mmol/L (ref 0.5–1.9)
Lactic Acid, Venous: 2.2 mmol/L (ref 0.5–1.9)

## 2022-05-17 LAB — PROCALCITONIN: Procalcitonin: <0.1 ng/mL

## 2022-05-17 LAB — TROPONIN I (HIGH SENSITIVITY): Troponin I (High Sensitivity): 10 ng/L (ref <18)

## 2022-05-17 NOTE — ED Notes (Signed)
Nephew calling again requesting full update and results, MD Wyline Copas sent secure chat and requested to call nephew and update. Secure chat shows as read

## 2022-05-17 NOTE — ED Notes (Addendum)
Stool/Urine incontinence care provided , repositioned for comfort , Purewick external catheter applied.

## 2022-05-17 NOTE — ED Notes (Signed)
Nephew called and stated that he wanted an update, results of imaging and tests and plan of care for hospital. Stated no one had spoken with him since she got here last night. Page sent to admitting MD Wyline Copas to request call and contact info included.

## 2022-05-17 NOTE — Hospital Course (Signed)
88 y.o. female with medical history significant of atrial fibrillation, hypertension, depression, pulmonary embolus, hyperlipidemia, breast cancer, GERD, recent admission for pneumonia presenting with shortness of breath and cough

## 2022-05-17 NOTE — ED Notes (Signed)
Natalie West [facility]) called asking for an update on [place]. His number is [phone].

## 2022-05-17 NOTE — ED Notes (Signed)
Spoke w/ nephew and patient. Provided update to nephew and let nephew know this RN had notified MD several times this AM requesting he update family.

## 2022-05-17 NOTE — ED Notes (Signed)
ED TO INPATIENT HANDOFF REPORT   S Name/Age/Gender Natalie West 88 y.o. female Room/Bed: 015C/015C  Code Status   Code Status: DNR  Home    Triage Complete: Triage complete  Chief Complaint CAP (community acquired pneumonia) [J18.9]  Triage Note Pt arrived from home via [facility] for shob. Per Ems pt has "sepsis" and has been on an antibiotic, and just recently switched to another one. Pt was recently hospitalized and d/c on 7/6. Per EMS they had her on 10L O2 due to RR in the 40s, SpO2 has been 100% on 4L. 130/81, 120HR, cbg 170   Allergies Allergies  Allergen Reactions   Cipro [Ciprofloxacin Hcl] Other (See Comments)    Unknown reaction   Cleocin [Clindamycin] Other (See Comments)    Unknown reaction   Lincocin [Lincomycin Hcl] Other (See Comments)    Unknown reaction   Codeine Nausea And Vomiting   Flexeril [Cyclobenzaprine Hcl] Other (See Comments)    Unknown reaction   Hydrocodone Other (See Comments)    "Last time it was too strong and messed up my mind"   Macrobid [Nitrofurantoin] Other (See Comments)    Unknown reaction   Nalfon [Fenoprofen Calcium] Other (See Comments)    Unknown reaction   Noroxin [Norfloxacin] Other (See Comments)    Unknown reaction    Relafen [Nabumetone] Other (See Comments)    Unknown reaction    Sulfa Antibiotics Other (See Comments)    Unknown reaction   Voltaren [Diclofenac] Other (See Comments)    Unknown reaction     Level of Care/Admitting Diagnosis ED Disposition     ED Disposition  Admit   Condition  --   Comment  Hospital Area: [place] [[id number]]  Level of Care: Telemetry Medical [104]  May place patient in observation at [facility] or [facility] if equivalent level of care is available:: No  Covid Evaluation: Asymptomatic - no recent exposure (last 10 days) testing not required  Diagnosis: CAP (community acquired pneumonia) [314970]  Admitting Physician: Marcelyn Bruins [[id number]]   Attending Physician: Marcelyn Bruins [[id number]]          B Medical/Surgery History Past Medical History:  Diagnosis Date   Acute diastolic heart failure ([place]) 05/07/2021   Acute pulmonary embolism ([place]) 04/12/2022   Anxiety    Arthritis    Breast cancer ([place]) 2006   right breast   CAP (community acquired pneumonia) 05/03/2022   Chronic cough    Chronic UTI    Decubitus skin ulcer 05/04/2022   Depression    Dysrhythmia    Esophageal reflux    Esophageal stricture    Family history of malignant neoplasm of gastrointestinal tract    GERD (gastroesophageal reflux disease)    Hearing loss    Hemorrhoids    internal   History of hiatal hernia    Hypercholesterolemia    Hypertension    Hypokalemia 05/04/2022   Numbness and tingling    feeet, occasionally   Osteopenia    Personal history of colonic polyps 02/09/2007   TUBULAR ADENOMA   Personal history of radiation therapy    PONV (postoperative nausea and vomiting)    trouble opening mouth and jaw, jaw pops, trouble turning head   Rotator cuff tear    RT   Status post dilation of esophageal narrowing    Umbilical hernia    Past Surgical History:  Procedure Laterality Date   BREAST LUMPECTOMY  2006   right lumpectomy   CATARACT EXTRACTION  W/ INTRAOCULAR LENS IMPLANT Bilateral    COLONOSCOPY     CYSTOCELE REPAIR N/A 04/20/2016   Procedure: AUGMENTED ANTERIOR VAULT REPAIR, COLOPLAST DERMIS GRAFT KELLY PLICATION SACROSPINOUS FIXATION;  Surgeon: Carolan Clines, MD;  Location: [facility];  Service: Urology;  Laterality: N/A;   DILATION AND CURETTAGE OF UTERUS     ESOPHAGOGASTRODUODENOSCOPY ENDOSCOPY     PUBOVAGINAL SLING N/A 04/20/2016   Procedure: PUBO-VAGINAL [name];  Surgeon: Carolan Clines, MD;  Location: [facility];  Service: Urology;  Laterality: N/A;   REVISION URINARY SLING N/A 06/12/2016   Procedure: INCISION OF URETHRAL SLING;  Surgeon: Carolan Clines, MD;  Location: [facility];  Service: Urology;  Laterality: N/A;   1 HOUR   SHOULDER OPEN ROTATOR CUFF REPAIR Right 02/28/2013   Procedure: RIGHT ROTATOR CUFF REPAIR SHOULDER OPEN WITH GRAFT AND ANCHORS;  Surgeon: Tobi Bastos, MD;  Location: [facility];  Service: Orthopedics;  Laterality: Right;   UMBILICAL HERNIA REPAIR       A IV Location/Drains/Wounds Patient Lines/Drains/Airways Status     Active Line/Drains/Airways     Name Placement date Placement time Site Days   Peripheral IV 05/16/22 20 G Anterior;Left Forearm 05/16/22  2055  Forearm  1   Peripheral IV 05/16/22 20 G Anterior;Left;Proximal Forearm 05/16/22  2130  Forearm  1   External Urinary Catheter 05/04/22  1515  --  13   Pressure Injury 04/13/22 Sacrum Medial Stage 2 -  Partial thickness loss of dermis presenting as a shallow open injury with a red, pink wound bed without slough. 04/13/22  1930  -- 34   Pressure Injury 04/13/22 Vertebral column Medial Stage 2 -  Partial thickness loss of dermis presenting as a shallow open injury with a red, pink wound bed without slough. 04/13/22  1930  -- 34            Intake/Output Last 24 hours  Intake/Output Summary (Last 24 hours) at 05/17/2022 0243 Last data filed at 05/16/2022 2254 Gross per 24 hour  Intake 1375.03 ml  Output --  Net 1375.03 ml    Labs/Imaging Results for orders placed or performed during the hospital encounter of 05/16/22 (from the past 48 hour(s))  CBC with Differential     Status: Abnormal   Collection Time: 05/16/22  8:51 PM  Result Value Ref Range   WBC 18.6 (H) 4.0 - 10.5 K/uL   RBC 3.85 (L) 3.87 - 5.11 MIL/uL   Hemoglobin 13.0 12.0 - 15.0 g/dL   HCT 41.9 36.0 - 46.0 %   MCV 108.8 (H) 80.0 - 100.0 fL   MCH 33.8 26.0 - 34.0 pg   MCHC 31.0 30.0 - 36.0 g/dL   RDW 15.9 (H) 11.5 - 15.5 %   Platelets 311 150 - 400 K/uL   nRBC 0.0 0.0 - 0.2 %   Neutrophils Relative % 74 %   Neutro Abs 14.0 (H) 1.7 - 7.7 K/uL   Lymphocytes Relative 13 %   Lymphs Abs 2.3 0.7 - 4.0 K/uL   Monocytes Relative 8 %   Monocytes  Absolute 1.4 (H) 0.1 - 1.0 K/uL   Eosinophils Relative 1 %   Eosinophils Absolute 0.2 0.0 - 0.5 K/uL   Basophils Relative 1 %   Basophils Absolute 0.1 0.0 - 0.1 K/uL   Immature Granulocytes 3 %   Abs Immature Granulocytes 0.54 (H) 0.00 - 0.07 K/uL    Comment: Performed at [facility], [address], [place], [place] [address]  Comprehensive metabolic panel  Status: Abnormal   Collection Time: 05/16/22  8:51 PM  Result Value Ref Range   Sodium 138 135 - 145 mmol/L   Potassium 3.7 3.5 - 5.1 mmol/L   Chloride 106 98 - 111 mmol/L   CO2 19 (L) 22 - 32 mmol/L   Glucose, Bld 142 (H) 70 - 99 mg/dL    Comment: Glucose reference range applies only to samples taken after fasting for at least 8 hours.   BUN 20 8 - 23 mg/dL   Creatinine, Ser 0.94 0.44 - 1.00 mg/dL   Calcium 8.5 (L) 8.9 - 10.3 mg/dL   Total Protein 6.4 (L) 6.5 - 8.1 g/dL   Albumin 3.2 (L) 3.5 - 5.0 g/dL   AST 18 15 - 41 U/L   ALT 12 0 - 44 U/L   Alkaline Phosphatase 71 38 - 126 U/L   Total Bilirubin 0.3 0.3 - 1.2 mg/dL   GFR, Estimated 58 (L) >60 mL/min    Comment: (NOTE) Calculated using the CKD-EPI Creatinine Equation (2021)    Anion gap 13 5 - 15    Comment: Performed at [facility], [place] [address] [place], [place] [address]  Lactic acid, plasma     Status: Abnormal   Collection Time: 05/16/22  8:51 PM  Result Value Ref Range   Lactic Acid, Venous 3.3 (HH) 0.5 - 1.9 mmol/L    Comment: CRITICAL RESULT CALLED TO, READ BACK BY AND VERIFIED WITH Joya San, RN, 2212 05/16/22, Courtney Paris Performed at [facility], [place] [address], [place], [place] [address]   Lipase, blood     Status: None   Collection Time: 05/16/22  8:51 PM  Result Value Ref Range   Lipase 26 11 - 51 U/L    Comment: Performed at [facility], [place] [address], [place], [place] [address]  Troponin I (High Sensitivity)     Status: None   Collection Time: 05/16/22  8:51 PM  Result Value Ref Range   Troponin I (High  Sensitivity) 15 <18 ng/L    Comment: (NOTE) Elevated high sensitivity troponin I (hsTnI) values and significant  changes across serial measurements may suggest ACS but many other  chronic and acute conditions are known to elevate hsTnI results.  Refer to the "Links" section for chest pain algorithms and additional  guidance. Performed at [facility], [place] [address] [place], [place] [address]   Brain natriuretic peptide     Status: Abnormal   Collection Time: 05/16/22  8:51 PM  Result Value Ref Range   B Natriuretic Peptide 148.8 (H) 0.0 - 100.0 pg/mL    Comment: Performed at [facility] [address] [place], [place] [address]  Magnesium     Status: None   Collection Time: 05/16/22  8:51 PM  Result Value Ref Range   Magnesium 2.2 1.7 - 2.4 mg/dL    Comment: Performed at [facility], [place] [address], [place], [place] [address]  Protime-INR     Status: Abnormal   Collection Time: 05/16/22  8:51 PM  Result Value Ref Range   Prothrombin Time 18.8 (H) 11.4 - 15.2 seconds   INR 1.6 (H) 0.8 - 1.2    Comment: (NOTE) INR goal varies based on device and disease states. Performed at [facility], [place] [address], [place], [place] [address]   APTT     Status: None   Collection Time: 05/16/22  8:51 PM  Result Value Ref  Range   aPTT 35 24 - 36 seconds    Comment: Performed at [facility] [address], [place], [place] [address]  Resp Panel by RT-PCR (Flu A&B, Covid) Anterior Nasal Swab     Status: None   Collection Time: 05/16/22  9:49 PM   Specimen: Anterior Nasal Swab  Result Value Ref Range   SARS Coronavirus 2 by RT PCR NEGATIVE NEGATIVE    Comment: (NOTE) SARS-CoV-2 target nucleic acids are NOT DETECTED.  The SARS-CoV-2 RNA is generally detectable in upper respiratory specimens during the acute phase of infection. The lowest concentration of SARS-CoV-2 viral copies this assay can detect is 138 copies/mL. A negative result does not preclude  SARS-Cov-2 infection and should not be used as the sole basis for treatment or other patient management decisions. A negative result may occur with  improper specimen collection/handling, submission of specimen other than nasopharyngeal swab, presence of viral mutation(s) within the areas targeted by this assay, and inadequate number of viral copies(<138 copies/mL). A negative result must be combined with clinical observations, patient history, and epidemiological information. The expected result is Negative.  Fact Sheet for Patients:  EntrepreneurPulse.com.au  Fact Sheet for Healthcare Providers:  IncredibleEmployment.be  This test is no t yet approved or cleared by the [place] FDA and  has been authorized for detection and/or diagnosis of SARS-CoV-2 by FDA under an Emergency Use Authorization (EUA). This EUA will remain  in effect (meaning this test can be used) for the duration of the COVID-19 declaration under Section 564(b)(1) of the Act, 21 U.S.C.section 360bbb-3(b)(1), unless the authorization is terminated  or revoked sooner.       Influenza A by PCR NEGATIVE NEGATIVE   Influenza B by PCR NEGATIVE NEGATIVE    Comment: (NOTE) The Xpert Xpress SARS-CoV-2/FLU/RSV plus assay is intended as an aid in the diagnosis of influenza from Nasopharyngeal swab specimens and should not be used as a sole basis for treatment. Nasal washings and aspirates are unacceptable for Xpert Xpress SARS-CoV-2/FLU/RSV testing.  Fact Sheet for Patients: EntrepreneurPulse.com.au  Fact Sheet for Healthcare Providers: IncredibleEmployment.be  This test is not yet approved or cleared by the [place] FDA and has been authorized for detection and/or diagnosis of SARS-CoV-2 by FDA under an Emergency Use Authorization (EUA). This EUA will remain in effect (meaning this test can be used) for the duration of the COVID-19  declaration under Section 564(b)(1) of the Act, 21 U.S.C. section 360bbb-3(b)(1), unless the authorization is terminated or revoked.  Performed at [facility], [place] [address] [place], [place] [address]   Lactic acid, plasma     Status: Abnormal   Collection Time: 05/17/22  1:08 AM  Result Value Ref Range   Lactic Acid, Venous 6.2 (HH) 0.5 - 1.9 mmol/L    Comment: CRITICAL VALUE NOTED. VALUE IS CONSISTENT WITH PREVIOUSLY REPORTED/CALLED VALUE Performed at [facility], [place] [address], [place], [place] [address]   Troponin I (High Sensitivity)     Status: None   Collection Time: 05/17/22  1:08 AM  Result Value Ref Range   Troponin I (High Sensitivity) 10 <18 ng/L    Comment: (NOTE) Elevated high sensitivity troponin I (hsTnI) values and significant  changes across serial measurements may suggest ACS but many other  chronic and acute conditions are known to elevate hsTnI results.  Refer to the "Links" section for chest pain algorithms and additional  guidance. Performed at [facility], [place] [address],  [place], [place] [address]    CT Angio Chest PE W and/or Wo Contrast  Addendum Date: 05/16/2022   ADDENDUM REPORT: 05/16/2022 23:23 ADDENDUM: This addendum is to add an additional findings/impression. There is skin and soft tissues thickening overlying the left posterior aspect of T8 through T10 vertebral body and spinous processes. No well-defined or peripherally enhancing collection to suggest abscess. No soft tissue gas. There is no associated bony destructive change to suggest osteomyelitis. Suspect cellulitis. Electronically Signed   By: Keith Rake M.D.   On: 05/16/2022 23:23   Result Date: 05/16/2022 CLINICAL DATA:  Pulmonary embolism (PE) suspected, high prob Prior pulm embolism, says she may not taken her blood thinners this week. Also cough and chills with recent pneumonia. Also erythematous area on her back concerning for infection EXAM: CT ANGIOGRAPHY  CHEST WITH CONTRAST TECHNIQUE: Multidetector CT imaging of the chest was performed using the standard protocol during bolus administration of intravenous contrast. Multiplanar CT image reconstructions and MIPs were obtained to evaluate the vascular anatomy. RADIATION DOSE REDUCTION: This exam was performed according to the departmental dose-optimization program which includes automated exposure control, adjustment of the mA and/or kV according to patient size and/or use of iterative reconstruction technique. CONTRAST:  5m OMNIPAQUE IOHEXOL 350 MG/ML SOLN COMPARISON:  Radiograph earlier today. Chest CTA 2 weeks ago 05/02/2022, also 04/12/2022 FINDINGS: Cardiovascular: No filling defects in the pulmonary arteries to suggest pulmonary embolus. Dilated main right pulmonary artery at 3.8 cm, mild dilated left pulmonary artery at 2.7 cm. Aortic atherosclerosis and tortuosity. Cannot assess for dissection given phase of contrast tailored to pulmonary arteries ses mint multi chamber cardiomegaly. There are coronary artery calcifications no pericardial effusion. Mediastinum/Nodes: Shotty and mildly enlarged mediastinal and hilar lymph nodes, largest in the subcarinal station measuring 11 mm. Decompressed esophagus. Lungs/Pleura: Again seen heterogeneous areas of ground-glass opacity and septal thickening with a slight upper lobe predominant distribution. Similar small bilateral pleural effusions to prior exam. Again seen areas of subpleural reticulation and traction bronchiectasis. There is prominent central bronchial thickening areas of mucoid impaction in the lower lobes. Upper Abdomen: No acute findings. Small cyst in the upper right kidney, needs no further follow-up. Musculoskeletal: Exaggerated thoracic kyphosis. Stable bone island within T1 vertebral body. Subacute anterior rib fractures. No acute osseous findings. Review of the MIP images confirms the above findings. IMPRESSION: 1. No pulmonary embolus. 2.  Persistent heterogeneous areas of ground-glass opacity and septal thickening with a slight upper lobe predominant distribution. This is without significant interval change from exam 2 weeks ago. Differential considerations include pulmonary edema versus atypical infection or interstitial lung disease. 3. Small bilateral pleural effusions. 4. Prominent central bronchial thickening with areas of mucoid impaction in the lower lobes. 5. Cardiomegaly with coronary artery calcifications. Dilated main pulmonary artery suggesting pulmonary arterial hypertension. 6. Shotty and mildly enlarged mediastinal and hilar lymph nodes are likely reactive. Aortic Atherosclerosis (ICD10-I70.0). Electronically Signed: By: MKeith RakeM.D. On: 05/16/2022 23:14   DG Chest Port 1 View  Result Date: 05/16/2022 CLINICAL DATA:  Questionable sepsis. EXAM: PORTABLE CHEST 1 VIEW COMPARISON:  Chest radiograph dated 05/02/2022. FINDINGS: Diffuse chronic truck coarsening. No consolidative changes. Probable trace bilateral pleural effusions and bibasilar atelectasis. No pneumothorax. Stable cardiomegaly. Atherosclerotic calcification of the aorta. No acute osseous pathology. IMPRESSION: 1. Probable trace bilateral pleural effusions and bibasilar atelectasis. 2. Stable cardiomegaly. Electronically Signed   By: AAnner CreteM.D.   On: 05/16/2022 21:49    Pending Labs Unresulted Labs (From admission, onward)  Start     Ordered   05/23/22 0500  Creatinine, serum  (enoxaparin (LOVENOX)    CrCl >/= 30 ml/min)  Weekly,   R     Comments: while on enoxaparin therapy    05/16/22 2357   05/17/22 0500  Comprehensive metabolic panel  Tomorrow morning,   R        05/16/22 2357   05/17/22 0500  CBC  Tomorrow morning,   R        05/16/22 2357   05/16/22 2111  Urinalysis, Routine w reflex microscopic  Once,   URGENT        05/16/22 2110   05/16/22 2111  Urine Culture  Once,   URGENT       Question:  Indication  Answer:  Altered  mental status (if no other cause identified)   05/16/22 2110   05/16/22 2111  Blood culture (routine x 2)  BLOOD CULTURE X 2,   R      05/16/22 2110            Vitals/Pain Today's Vitals   05/16/22 2315 05/17/22 0015 05/17/22 0100 05/17/22 0104  BP: 116/65 115/69 108/65   Pulse: 90 98 94   Resp: 20 (!) 22 (!) 23   Temp:      TempSrc:      SpO2: 100% 94% 95%   PainSc:    Asleep    Isolation Precautions No active isolations  Medications Medications  lactated ringers infusion ( Intravenous Rate/Dose Verify 05/16/22 2315)  cefTRIAXone (ROCEPHIN) 2 g in sodium chloride 0.9 % 100 mL IVPB (0 g Intravenous Stopped 05/16/22 2215)  azithromycin (ZITHROMAX) 500 mg in sodium chloride 0.9 % 250 mL IVPB (0 mg Intravenous Stopped 05/16/22 2253)  megestrol (MEGACE) 400 MG/10ML suspension 400 mg (has no administration in time range)  metoprolol tartrate (LOPRESSOR) tablet 25 mg (has no administration in time range)  pravastatin (PRAVACHOL) tablet 20 mg (20 mg Oral Not Given 05/17/22 0241)  sertraline (ZOLOFT) tablet 25 mg (has no administration in time range)  amitriptyline (ELAVIL) tablet 25 mg (has no administration in time range)  pantoprazole (PROTONIX) EC tablet 40 mg (has no administration in time range)  apixaban (ELIQUIS) tablet 5 mg (5 mg Oral Not Given 05/17/22 0242)  feeding supplement (ENSURE ENLIVE / ENSURE PLUS) liquid 237 mL (has no administration in time range)  albuterol (PROVENTIL) (2.5 MG/3ML) 0.083% nebulizer solution 2.5 mg (has no administration in time range)  acetaminophen (TYLENOL) tablet 650 mg (has no administration in time range)    Or  acetaminophen (TYLENOL) suppository 650 mg (has no administration in time range)  polyethylene glycol (MIRALAX / GLYCOLAX) packet 17 g (has no administration in time range)  lactated ringers bolus 1,000 mL (0 mLs Intravenous Stopped 05/16/22 2251)    And  lactated ringers bolus 500 mL (0 mLs Intravenous Stopped 05/17/22 0054)    And   lactated ringers bolus 250 mL (0 mLs Intravenous Stopped 05/17/22 0054)  iohexol (OMNIPAQUE) 350 MG/ML injection 75 mL (75 mLs Intravenous Contrast Given 05/16/22 2255)    Mobility: Walker  High fall risk      R Recommendations: See Admitting Provider Note

## 2022-05-17 NOTE — ED Notes (Signed)
Patient cleaned up with RN. Applied a clean chux. Provided new gown. She has a purewick in place.  Pt is now comfortable.

## 2022-05-17 NOTE — ED Notes (Signed)
ED TO INPATIENT HANDOFF REPORT  ED Nurse Name and Phone #: Deneise Lever [phone]  S Name/Age/Gender Natalie West 88 y.o. female Room/Bed: 015C/015C  Code Status   Code Status: DNR  Home/SNF/Other Skilled nursing facility Patient oriented to: self   Triage Complete: Triage complete  Chief Complaint CAP (community acquired pneumonia) [J18.9]  Triage Note Pt arrived from home via [facility] for shob. Per Ems pt has "sepsis" and has been on an antibiotic, and just recently switched to another one. Pt was recently hospitalized and d/c on 7/6. Per EMS they had her on 10L O2 due to RR in the 40s, SpO2 has been 100% on 4L. 130/81, 120HR, cbg 170   Allergies Allergies  Allergen Reactions   Cipro [Ciprofloxacin Hcl] Other (See Comments)    Unknown reaction   Cleocin [Clindamycin] Other (See Comments)    Unknown reaction   Lincocin [Lincomycin Hcl] Other (See Comments)    Unknown reaction   Codeine Nausea And Vomiting   Flexeril [Cyclobenzaprine Hcl] Other (See Comments)    Unknown reaction   Hydrocodone Other (See Comments)    "Last time it was too strong and messed up my mind"   Macrobid [Nitrofurantoin] Other (See Comments)    Unknown reaction   Nalfon [Fenoprofen Calcium] Other (See Comments)    Unknown reaction   Noroxin [Norfloxacin] Other (See Comments)    Unknown reaction    Relafen [Nabumetone] Other (See Comments)    Unknown reaction    Sulfa Antibiotics Other (See Comments)    Unknown reaction   Voltaren [Diclofenac] Other (See Comments)    Unknown reaction     Level of Care/Admitting Diagnosis ED Disposition     ED Disposition  Admit   Condition  --   Comment  Hospital Area: [place] [[id number]]  Level of Care: Telemetry Medical [104]  May place patient in observation at [facility] or [place] if equivalent level of care is available:: No  Covid Evaluation: Asymptomatic - no recent exposure (last 10 days) testing not required  Diagnosis: CAP  (community acquired pneumonia) [[id number]]  Admitting Physician: Marcelyn Bruins [[id number]]  Attending Physician: Marcelyn Bruins [[id number]]          B Medical/Surgery History Past Medical History:  Diagnosis Date   Acute diastolic heart failure ([place]) 05/07/2021   Acute pulmonary embolism ([place]) 04/12/2022   Anxiety    Arthritis    Breast cancer ([place]) 2006   right breast   CAP (community acquired pneumonia) 05/03/2022   Chronic cough    Chronic UTI    Decubitus skin ulcer 05/04/2022   Depression    Dysrhythmia    Esophageal reflux    Esophageal stricture    Family history of malignant neoplasm of gastrointestinal tract    GERD (gastroesophageal reflux disease)    Hearing loss    Hemorrhoids    internal   History of hiatal hernia    Hypercholesterolemia    Hypertension    Hypokalemia 05/04/2022   Numbness and tingling    feeet, occasionally   Osteopenia    Personal history of colonic polyps 02/09/2007   TUBULAR ADENOMA   Personal history of radiation therapy    PONV (postoperative nausea and vomiting)    trouble opening mouth and jaw, jaw pops, trouble turning head   Rotator cuff tear    RT   Status post dilation of esophageal narrowing    Umbilical hernia    Past Surgical History:  Procedure Laterality Date  BREAST LUMPECTOMY  2006   right lumpectomy   CATARACT EXTRACTION W/ INTRAOCULAR LENS IMPLANT Bilateral    COLONOSCOPY     CYSTOCELE REPAIR N/A 04/20/2016   Procedure: AUGMENTED ANTERIOR VAULT REPAIR, COLOPLAST DERMIS GRAFT KELLY PLICATION SACROSPINOUS FIXATION;  Surgeon: Carolan Clines, MD;  Location: [facility];  Service: Urology;  Laterality: N/A;   DILATION AND CURETTAGE OF UTERUS     ESOPHAGOGASTRODUODENOSCOPY ENDOSCOPY     PUBOVAGINAL SLING N/A 04/20/2016   Procedure: PUBO-VAGINAL [name];  Surgeon: Carolan Clines, MD;  Location: [facility];  Service: Urology;  Laterality: N/A;   REVISION URINARY SLING N/A 06/12/2016   Procedure: INCISION OF URETHRAL  SLING;  Surgeon: Carolan Clines, MD;  Location: [facility];  Service: Urology;  Laterality: N/A;  1 HOUR   SHOULDER OPEN ROTATOR CUFF REPAIR Right 02/28/2013   Procedure: RIGHT ROTATOR CUFF REPAIR SHOULDER OPEN WITH GRAFT AND ANCHORS;  Surgeon: Tobi Bastos, MD;  Location: [facility];  Service: Orthopedics;  Laterality: Right;   UMBILICAL HERNIA REPAIR       A IV Location/Drains/Wounds Patient Lines/Drains/Airways Status     Active Line/Drains/Airways     Name Placement date Placement time Site Days   Peripheral IV 05/16/22 20 G Anterior;Left Forearm 05/16/22  2055  Forearm  1   Peripheral IV 05/16/22 20 G Anterior;Left;Proximal Forearm 05/16/22  2130  Forearm  1   External Urinary Catheter 05/04/22  1515  --  13   Pressure Injury 04/13/22 Sacrum Medial Stage 2 -  Partial thickness loss of dermis presenting as a shallow open injury with a red, pink wound bed without slough. 04/13/22  1930  -- 34   Pressure Injury 04/13/22 Vertebral column Medial Stage 2 -  Partial thickness loss of dermis presenting as a shallow open injury with a red, pink wound bed without slough. 04/13/22  1930  -- 34            Intake/Output Last 24 hours  Intake/Output Summary (Last 24 hours) at 05/17/2022 1608 Last data filed at 05/17/2022 1601 Gross per 24 hour  Intake 3375.03 ml  Output --  Net 3375.03 ml    Labs/Imaging Results for orders placed or performed during the hospital encounter of 05/16/22 (from the past 48 hour(s))  CBC with Differential     Status: Abnormal   Collection Time: 05/16/22  8:51 PM  Result Value Ref Range   WBC 18.6 (H) 4.0 - 10.5 K/uL   RBC 3.85 (L) 3.87 - 5.11 MIL/uL   Hemoglobin 13.0 12.0 - 15.0 g/dL   HCT 41.9 36.0 - 46.0 %   MCV 108.8 (H) 80.0 - 100.0 fL   MCH 33.8 26.0 - 34.0 pg   MCHC 31.0 30.0 - 36.0 g/dL   RDW 15.9 (H) 11.5 - 15.5 %   Platelets 311 150 - 400 K/uL   nRBC 0.0 0.0 - 0.2 %   Neutrophils Relative % 74 %   Neutro Abs 14.0 (H) 1.7 - 7.7 K/uL    Lymphocytes Relative 13 %   Lymphs Abs 2.3 0.7 - 4.0 K/uL   Monocytes Relative 8 %   Monocytes Absolute 1.4 (H) 0.1 - 1.0 K/uL   Eosinophils Relative 1 %   Eosinophils Absolute 0.2 0.0 - 0.5 K/uL   Basophils Relative 1 %   Basophils Absolute 0.1 0.0 - 0.1 K/uL   Immature Granulocytes 3 %   Abs Immature Granulocytes 0.54 (H) 0.00 - 0.07 K/uL    Comment: Performed at [facility]  Lab, [address] [place], [place] [address]  Comprehensive metabolic panel     Status: Abnormal   Collection Time: 05/16/22  8:51 PM  Result Value Ref Range   Sodium 138 135 - 145 mmol/L   Potassium 3.7 3.5 - 5.1 mmol/L   Chloride 106 98 - 111 mmol/L   CO2 19 (L) 22 - 32 mmol/L   Glucose, Bld 142 (H) 70 - 99 mg/dL    Comment: Glucose reference range applies only to samples taken after fasting for at least 8 hours.   BUN 20 8 - 23 mg/dL   Creatinine, Ser 0.94 0.44 - 1.00 mg/dL   Calcium 8.5 (L) 8.9 - 10.3 mg/dL   Total Protein 6.4 (L) 6.5 - 8.1 g/dL   Albumin 3.2 (L) 3.5 - 5.0 g/dL   AST 18 15 - 41 U/L   ALT 12 0 - 44 U/L   Alkaline Phosphatase 71 38 - 126 U/L   Total Bilirubin 0.3 0.3 - 1.2 mg/dL   GFR, Estimated 58 (L) >60 mL/min    Comment: (NOTE) Calculated using the CKD-EPI Creatinine Equation (2021)    Anion gap 13 5 - 15    Comment: Performed at [facility], [place] [address] [place], [place] [address]  Lactic acid, plasma     Status: Abnormal   Collection Time: 05/16/22  8:51 PM  Result Value Ref Range   Lactic Acid, Venous 3.3 (HH) 0.5 - 1.9 mmol/L    Comment: CRITICAL RESULT CALLED TO, READ BACK BY AND VERIFIED WITH Joya San, RN, 2212 05/16/22, Courtney Paris Performed at [facility], [place] [address] [place], [place] [address]   Lipase, blood     Status: None   Collection Time: 05/16/22  8:51 PM  Result Value Ref Range   Lipase 26 11 - 51 U/L    Comment: Performed at [facility], [place] [address], [place], [place] [address]  Troponin I (High Sensitivity)     Status:  None   Collection Time: 05/16/22  8:51 PM  Result Value Ref Range   Troponin I (High Sensitivity) 15 <18 ng/L    Comment: (NOTE) Elevated high sensitivity troponin I (hsTnI) values and significant  changes across serial measurements may suggest ACS but many other  chronic and acute conditions are known to elevate hsTnI results.  Refer to the "Links" section for chest pain algorithms and additional  guidance. Performed at [facility], [place] [address], [place], [place] [address]   Brain natriuretic peptide     Status: Abnormal   Collection Time: 05/16/22  8:51 PM  Result Value Ref Range   B Natriuretic Peptide 148.8 (H) 0.0 - 100.0 pg/mL    Comment: Performed at [place] [address] [place], [place] [address]  Magnesium     Status: None   Collection Time: 05/16/22  8:51 PM  Result Value Ref Range   Magnesium 2.2 1.7 - 2.4 mg/dL    Comment: Performed at [facility], [place] [address], [place], [place] [address]  Protime-INR     Status: Abnormal   Collection Time: 05/16/22  8:51 PM  Result Value Ref Range   Prothrombin Time 18.8 (H) 11.4 - 15.2 seconds   INR 1.6 (H) 0.8 - 1.2    Comment: (NOTE) INR goal varies based on device and disease states. Performed at [facility], [place] [address], [place], [place] [address]   APTT  Status: None   Collection Time: 05/16/22  8:51 PM  Result Value Ref Range   aPTT 35 24 - 36 seconds    Comment: Performed at [facility], [place] [address] [place], [place] [address]  Blood culture (routine x 2)     Status: None (Preliminary result)   Collection Time: 05/16/22  9:25 PM   Specimen: BLOOD LEFT HAND  Result Value Ref Range   Specimen Description BLOOD LEFT HAND    Special Requests      BOTTLES DRAWN AEROBIC AND ANAEROBIC Blood Culture adequate volume   Culture      NO GROWTH < 12 HOURS Performed at [facility], [place] [address], [place], [place] [address]    Report Status PENDING   Blood culture  (routine x 2)     Status: None (Preliminary result)   Collection Time: 05/16/22  9:30 PM   Specimen: BLOOD  Result Value Ref Range   Specimen Description BLOOD LEFT ANTECUBITAL    Special Requests      BOTTLES DRAWN AEROBIC AND ANAEROBIC Blood Culture adequate volume   Culture      NO GROWTH < 12 HOURS Performed at [facility], [place] [address], [place], [place] [address]    Report Status PENDING   Resp Panel by RT-PCR (Flu A&B, Covid) Anterior Nasal Swab     Status: None   Collection Time: 05/16/22  9:49 PM   Specimen: Anterior Nasal Swab  Result Value Ref Range   SARS Coronavirus 2 by RT PCR NEGATIVE NEGATIVE    Comment: (NOTE) SARS-CoV-2 target nucleic acids are NOT DETECTED.  The SARS-CoV-2 RNA is generally detectable in upper respiratory specimens during the acute phase of infection. The lowest concentration of SARS-CoV-2 viral copies this assay can detect is 138 copies/mL. A negative result does not preclude SARS-Cov-2 infection and should not be used as the sole basis for treatment or other patient management decisions. A negative result may occur with  improper specimen collection/handling, submission of specimen other than nasopharyngeal swab, presence of viral mutation(s) within the areas targeted by this assay, and inadequate number of viral copies(<138 copies/mL). A negative result must be combined with clinical observations, patient history, and epidemiological information. The expected result is Negative.  Fact Sheet for Patients:  EntrepreneurPulse.com.au  Fact Sheet for Healthcare Providers:  IncredibleEmployment.be  This test is no t yet approved or cleared by the [place] FDA and  has been authorized for detection and/or diagnosis of SARS-CoV-2 by FDA under an Emergency Use Authorization (EUA). This EUA will remain  in effect (meaning this test can be used) for the duration of the COVID-19 declaration under  Section 564(b)(1) of the Act, 21 U.S.C.section 360bbb-3(b)(1), unless the authorization is terminated  or revoked sooner.       Influenza A by PCR NEGATIVE NEGATIVE   Influenza B by PCR NEGATIVE NEGATIVE    Comment: (NOTE) The Xpert Xpress SARS-CoV-2/FLU/RSV plus assay is intended as an aid in the diagnosis of influenza from Nasopharyngeal swab specimens and should not be used as a sole basis for treatment. Nasal washings and aspirates are unacceptable for Xpert Xpress SARS-CoV-2/FLU/RSV testing.  Fact Sheet for Patients: EntrepreneurPulse.com.au  Fact Sheet for Healthcare Providers: IncredibleEmployment.be  This test is not yet approved or cleared by the [place] FDA and has been authorized for detection and/or diagnosis of SARS-CoV-2 by FDA under an Emergency Use Authorization (EUA). This EUA will remain in effect (meaning this test can  be used) for the duration of the COVID-19 declaration under Section 564(b)(1) of the Act, 21 U.S.C. section 360bbb-3(b)(1), unless the authorization is terminated or revoked.  Performed at [facility], [place] [address], [place], [place] [address]   Lactic acid, plasma     Status: Abnormal   Collection Time: 05/17/22  1:08 AM  Result Value Ref Range   Lactic Acid, Venous 6.2 (HH) 0.5 - 1.9 mmol/L    Comment: CRITICAL VALUE NOTED. VALUE IS CONSISTENT WITH PREVIOUSLY REPORTED/CALLED VALUE Performed at [facility], [place] [address] [place], [place] [address]   Troponin I (High Sensitivity)     Status: None   Collection Time: 05/17/22  1:08 AM  Result Value Ref Range   Troponin I (High Sensitivity) 10 <18 ng/L    Comment: (NOTE) Elevated high sensitivity troponin I (hsTnI) values and significant  changes across serial measurements may suggest ACS but many other  chronic and acute conditions are known to elevate hsTnI results.  Refer to the "Links" section for chest pain algorithms and  additional  guidance. Performed at [facility], [place] [address] [place], [place] [address]   Comprehensive metabolic panel     Status: Abnormal   Collection Time: 05/17/22  3:23 AM  Result Value Ref Range   Sodium 141 135 - 145 mmol/L   Potassium 3.7 3.5 - 5.1 mmol/L   Chloride 108 98 - 111 mmol/L   CO2 20 (L) 22 - 32 mmol/L   Glucose, Bld 94 70 - 99 mg/dL    Comment: Glucose reference range applies only to samples taken after fasting for at least 8 hours.   BUN 17 8 - 23 mg/dL   Creatinine, Ser 0.82 0.44 - 1.00 mg/dL   Calcium 8.0 (L) 8.9 - 10.3 mg/dL   Total Protein 5.7 (L) 6.5 - 8.1 g/dL   Albumin 2.7 (L) 3.5 - 5.0 g/dL   AST 16 15 - 41 U/L   ALT 11 0 - 44 U/L   Alkaline Phosphatase 59 38 - 126 U/L   Total Bilirubin 0.4 0.3 - 1.2 mg/dL   GFR, Estimated >60 >60 mL/min    Comment: (NOTE) Calculated using the CKD-EPI Creatinine Equation (2021)    Anion gap 13 5 - 15    Comment: Performed at [facility], [place] [address], [place], [place] [address]  CBC     Status: Abnormal   Collection Time: 05/17/22  3:23 AM  Result Value Ref Range   WBC 17.8 (H) 4.0 - 10.5 K/uL   RBC 3.17 (L) 3.87 - 5.11 MIL/uL   Hemoglobin 10.4 (L) 12.0 - 15.0 g/dL   HCT 33.9 (L) 36.0 - 46.0 %   MCV 106.9 (H) 80.0 - 100.0 fL   MCH 32.8 26.0 - 34.0 pg   MCHC 30.7 30.0 - 36.0 g/dL   RDW 15.9 (H) 11.5 - 15.5 %   Platelets 233 150 - 400 K/uL   nRBC 0.0 0.0 - 0.2 %    Comment: Performed at [facility], [place] [address], [place], [place] [address]  Urinalysis, Routine w reflex microscopic     Status: Abnormal   Collection Time: 05/17/22  9:15 AM  Result Value Ref Range   Color, Urine AMBER (A) YELLOW    Comment: BIOCHEMICALS MAY BE AFFECTED BY COLOR   APPearance CLOUDY (A) CLEAR   Specific Gravity, Urine 1.040 (H) 1.005 - 1.030   pH 7.0 5.0 - 8.0  Glucose, UA >=500 (A) NEGATIVE mg/dL   Hgb urine dipstick MODERATE (A) NEGATIVE   Bilirubin Urine NEGATIVE NEGATIVE   Ketones, ur  NEGATIVE NEGATIVE mg/dL   Protein, ur NEGATIVE NEGATIVE mg/dL   Nitrite NEGATIVE NEGATIVE   Leukocytes,Ua LARGE (A) NEGATIVE   RBC / HPF 21-50 0 - 5 RBC/hpf   WBC, UA >50 (H) 0 - 5 WBC/hpf   Bacteria, UA FEW (A) NONE SEEN   Squamous Epithelial / LPF 0-5 0 - 5   Mucus PRESENT     Comment: Performed at [facility], [place] [address], [place], [place] [address]  Lactic acid, plasma     Status: Abnormal   Collection Time: 05/17/22 11:46 AM  Result Value Ref Range   Lactic Acid, Venous 2.7 (HH) 0.5 - 1.9 mmol/L    Comment: CRITICAL VALUE NOTED. VALUE IS CONSISTENT WITH PREVIOUSLY REPORTED/CALLED VALUE Performed at [facility], [place] [address], [place], [place] [address]    CT Angio Chest PE W and/or Wo Contrast  Addendum Date: 05/16/2022   ADDENDUM REPORT: 05/16/2022 23:23 ADDENDUM: This addendum is to add an additional findings/impression. There is skin and soft tissues thickening overlying the left posterior aspect of T8 through T10 vertebral body and spinous processes. No well-defined or peripherally enhancing collection to suggest abscess. No soft tissue gas. There is no associated bony destructive change to suggest osteomyelitis. Suspect cellulitis. Electronically Signed   By: Keith Rake M.D.   On: 05/16/2022 23:23   Result Date: 05/16/2022 CLINICAL DATA:  Pulmonary embolism (PE) suspected, high prob Prior pulm embolism, says she may not taken her blood thinners this week. Also cough and chills with recent pneumonia. Also erythematous area on her back concerning for infection EXAM: CT ANGIOGRAPHY CHEST WITH CONTRAST TECHNIQUE: Multidetector CT imaging of the chest was performed using the standard protocol during bolus administration of intravenous contrast. Multiplanar CT image reconstructions and MIPs were obtained to evaluate the vascular anatomy. RADIATION DOSE REDUCTION: This exam was performed according to the departmental dose-optimization program which includes automated  exposure control, adjustment of the mA and/or kV according to patient size and/or use of iterative reconstruction technique. CONTRAST:  50m OMNIPAQUE IOHEXOL 350 MG/ML SOLN COMPARISON:  Radiograph earlier today. Chest CTA 2 weeks ago 05/02/2022, also 04/12/2022 FINDINGS: Cardiovascular: No filling defects in the pulmonary arteries to suggest pulmonary embolus. Dilated main right pulmonary artery at 3.8 cm, mild dilated left pulmonary artery at 2.7 cm. Aortic atherosclerosis and tortuosity. Cannot assess for dissection given phase of contrast tailored to pulmonary arteries ses mint multi chamber cardiomegaly. There are coronary artery calcifications no pericardial effusion. Mediastinum/Nodes: Shotty and mildly enlarged mediastinal and hilar lymph nodes, largest in the subcarinal station measuring 11 mm. Decompressed esophagus. Lungs/Pleura: Again seen heterogeneous areas of ground-glass opacity and septal thickening with a slight upper lobe predominant distribution. Similar small bilateral pleural effusions to prior exam. Again seen areas of subpleural reticulation and traction bronchiectasis. There is prominent central bronchial thickening areas of mucoid impaction in the lower lobes. Upper Abdomen: No acute findings. Small cyst in the upper right kidney, needs no further follow-up. Musculoskeletal: Exaggerated thoracic kyphosis. Stable bone island within T1 vertebral body. Subacute anterior rib fractures. No acute osseous findings. Review of the MIP images confirms the above findings. IMPRESSION: 1. No pulmonary embolus. 2. Persistent heterogeneous areas of ground-glass opacity and septal thickening with a slight upper lobe predominant distribution. This is without significant interval change from exam 2 weeks ago. Differential considerations include  pulmonary edema versus atypical infection or interstitial lung disease. 3. Small bilateral pleural effusions. 4. Prominent central bronchial thickening with areas of  mucoid impaction in the lower lobes. 5. Cardiomegaly with coronary artery calcifications. Dilated main pulmonary artery suggesting pulmonary arterial hypertension. 6. Shotty and mildly enlarged mediastinal and hilar lymph nodes are likely reactive. Aortic Atherosclerosis (ICD10-I70.0). Electronically Signed: By: Keith Rake M.D. On: 05/16/2022 23:14   DG Chest Port 1 View  Result Date: 05/16/2022 CLINICAL DATA:  Questionable sepsis. EXAM: PORTABLE CHEST 1 VIEW COMPARISON:  Chest radiograph dated 05/02/2022. FINDINGS: Diffuse chronic truck coarsening. No consolidative changes. Probable trace bilateral pleural effusions and bibasilar atelectasis. No pneumothorax. Stable cardiomegaly. Atherosclerotic calcification of the aorta. No acute osseous pathology. IMPRESSION: 1. Probable trace bilateral pleural effusions and bibasilar atelectasis. 2. Stable cardiomegaly. Electronically Signed   By: Anner Crete M.D.   On: 05/16/2022 21:49    Pending Labs Unresulted Labs (From admission, onward)     Start     Ordered   05/23/22 0500  Creatinine, serum  (enoxaparin (LOVENOX)    CrCl >/= 30 ml/min)  Weekly,   R     Comments: while on enoxaparin therapy    05/16/22 2357   05/18/22 0500  Procalcitonin  Daily at 5am,   R      05/17/22 1016   05/17/22 1018  Lactic acid, plasma  STAT Now then every 3 hours,   R (with STAT occurrences)      05/17/22 1017   05/17/22 1016  Procalcitonin - Baseline  ONCE - URGENT,   URGENT        05/17/22 1016   05/16/22 2111  Urine Culture  Once,   URGENT       Question:  Indication  Answer:  Altered mental status (if no other cause identified)   05/16/22 2110            Vitals/Pain Today's Vitals   05/17/22 1245 05/17/22 1500 05/17/22 1530 05/17/22 1600  BP: 104/63 97/64 (!) 96/58 104/70  Pulse: 91 83 82 86  Resp: 18 20 (!) 26 (!) 21  Temp:      TempSrc:      SpO2: 99% 100% 100% 100%  PainSc:        Isolation Precautions No active  isolations  Medications Medications  lactated ringers infusion ( Intravenous New Bag/Given 05/17/22 0518)  cefTRIAXone (ROCEPHIN) 2 g in sodium chloride 0.9 % 100 mL IVPB (0 g Intravenous Stopped 05/16/22 2215)  azithromycin (ZITHROMAX) 500 mg in sodium chloride 0.9 % 250 mL IVPB (0 mg Intravenous Stopped 05/16/22 2253)  megestrol (MEGACE) 400 MG/10ML suspension 400 mg (400 mg Oral Given 05/17/22 0911)  metoprolol tartrate (LOPRESSOR) tablet 25 mg (25 mg Oral Given 05/17/22 0912)  pravastatin (PRAVACHOL) tablet 20 mg (20 mg Oral Not Given 05/17/22 0241)  sertraline (ZOLOFT) tablet 25 mg (25 mg Oral Given 05/17/22 0912)  amitriptyline (ELAVIL) tablet 25 mg (25 mg Oral Given 05/17/22 0911)  pantoprazole (PROTONIX) EC tablet 40 mg (40 mg Oral Given 05/17/22 0911)  apixaban (ELIQUIS) tablet 5 mg (5 mg Oral Given 05/17/22 0912)  feeding supplement (ENSURE ENLIVE / ENSURE PLUS) liquid 237 mL (237 mLs Oral Not Given 05/17/22 1405)  albuterol (PROVENTIL) (2.5 MG/3ML) 0.083% nebulizer solution 2.5 mg (has no administration in time range)  acetaminophen (TYLENOL) tablet 650 mg (has no administration in time range)    Or  acetaminophen (TYLENOL) suppository 650 mg (has no administration in time range)  polyethylene glycol (MIRALAX /  GLYCOLAX) packet 17 g (has no administration in time range)  lactated ringers bolus 1,000 mL (0 mLs Intravenous Stopped 05/16/22 2251)    And  lactated ringers bolus 500 mL (0 mLs Intravenous Stopped 05/17/22 0054)    And  lactated ringers bolus 250 mL (0 mLs Intravenous Stopped 05/17/22 0054)  iohexol (OMNIPAQUE) 350 MG/ML injection 75 mL (75 mLs Intravenous Contrast Given 05/16/22 2255)    Mobility Pt has not been out of bed down here, but profoundly weak, and kyphotic. Unsure of mobility outpation High fall risk   Focused Assessments Pulmonary Assessment Handoff:  Lung sounds: Bilateral Breath Sounds: Diminished L Breath Sounds: Diminished R Breath Sounds: Diminished O2  Device: Nasal Cannula O2 Flow Rate (L/min): 3 L/min    R Recommendations: See Admitting Provider Note  Report given to:   Additional Notes: Pt is currently confused, but redirectable. Frequently asks questions repetitively. Also incontinent of urine and stool. Uses purewick for urine, but has to be frequently changed because of loose copious amounts of stool. Just changed at 1400 and fresh purewick applied. Not hypoxic on RA, but increased work of breathing with any activity, therefore, 3LPM applied via [place] for comfort and WOB. 2 spots of skin breakdown, 1 on upper back and 1 on sacrum, mepilex applied in ED.

## 2022-05-17 NOTE — Progress Notes (Signed)
  Progress Note   Patient: Natalie West:[id number] DOB: [birth year] DOA: 05/16/2022     0 DOS: the patient was seen and examined on 05/17/2022   Brief hospital course: 88 y.o. female with medical history significant of atrial fibrillation, hypertension, depression, pulmonary embolus, hyperlipidemia, breast cancer, GERD, recent admission for pneumonia presenting with shortness of breath and cough  Assessment and Plan: Acute respiratory failure with hypoxia Pneumonia ?Developing sepsis > Patient presenting with worsening cough and shortness of breath for the past couple of days becoming severe today. > Noted to have leukocytosis and recently treated for pneumonia with similar changes on CT chest as 2 weeks ago. > Does have history of pulmonary embolus this year on anticoagulation but was unsure if she has been taking it for the last few days.  No PE on CT chest. > A degree of edema is on the differential for groundglass opacities and she does have mildly elevated BNP however given her elevated lactic acid and concern for developing sepsis patient received fluid bolus in the ED and remains on IV fluids. - Continue with ceftriaxone and azithromycin  - Continue with supplemental oxygen, wean as tolerated - WBC improving -Lactate peaked 6.2, down to 2.7 with IVF  Cellulitis Pressure injury > Noted to have chronic pressure injury at low back.  Has changes consistent with cellulitis, and CT findings consistent with soft tissue swelling overlying the region from T8-T10. - Cont on rocephin per above   Atrial fibrillation - Continue home metoprolol and Eliquis - rate controlled  Hypertension - Continue home metoprolol - BP stable and controlled  History of pulmonary embolus - Continue home Eliquis  Hyperlipidemia - Continue home pravastatin  GERD - Continue home PPI  History of breast cancer - stable  ?CHF > There is some possible CHF concern during recent admission.  Echo showed EF  55-60%, indeterminate diastolic function and normal RV function.  Is currently prescribed Lasix only as needed. -Continue to monitor       Subjective: Reports feeling better today  Physical Exam: Vitals:   05/17/22 1500 05/17/22 1530 05/17/22 1600 05/17/22 1800  BP: 97/64 (!) 96/58 104/70 115/81  Pulse: 83 82 86 91  Resp: 20 (!) 26 (!) 21 20  Temp:    99.4 F (37.4 C)  TempSrc:      SpO2: 100% 100% 100% 97%   General exam: Awake, laying in bed, in nad Respiratory system: Normal respiratory effort, no wheezing Cardiovascular system: regular rate, s1, s2 Gastrointestinal system: Soft, nondistended, positive BS Central nervous system: CN2-12 grossly intact, strength intact Extremities: Perfused, no clubbing Skin: Normal skin turgor, no notable skin lesions seen Psychiatry: Mood normal // no visual hallucinations   Data Reviewed:  Labs reviewed: Na 141, K 3.7, Cr 0.82, Lactic acid 2.7, WBC 17.8  Family Communication: Pt in room, family not at bedside  Disposition: Status is: Observation The patient will require care spanning > 2 midnights and should be moved to inpatient because: severity of illness  Planned Discharge Destination: Home    Author: Marylu Lund, MD 05/17/2022 6:14 PM  For on call review [email or web address].

## 2022-05-18 DIAGNOSIS — J9601 Acute respiratory failure with hypoxia: Secondary | ICD-10-CM | POA: Diagnosis not present

## 2022-05-18 LAB — COMPREHENSIVE METABOLIC PANEL
ALT: 8 U/L (ref 0–44)
AST: 14 U/L — ABNORMAL LOW (ref 15–41)
Albumin: 2.4 g/dL — ABNORMAL LOW (ref 3.5–5.0)
Alkaline Phosphatase: 52 U/L (ref 38–126)
Anion gap: 8 (ref 5–15)
BUN: 16 mg/dL (ref 8–23)
CO2: 21 mmol/L — ABNORMAL LOW (ref 22–32)
Calcium: 8 mg/dL — ABNORMAL LOW (ref 8.9–10.3)
Chloride: 110 mmol/L (ref 98–111)
Creatinine, Ser: 0.72 mg/dL (ref 0.44–1.00)
GFR, Estimated: >60 mL/min (ref >60)
Glucose, Bld: 98 mg/dL (ref 70–99)
Potassium: 4 mmol/L (ref 3.5–5.1)
Sodium: 139 mmol/L (ref 135–145)
Total Bilirubin: 0.3 mg/dL (ref 0.3–1.2)
Total Protein: 4.9 g/dL — ABNORMAL LOW (ref 6.5–8.1)

## 2022-05-18 LAB — URINE CULTURE

## 2022-05-18 LAB — CBC
HCT: 32.6 % — ABNORMAL LOW (ref 36.0–46.0)
Hemoglobin: 10.2 g/dL — ABNORMAL LOW (ref 12.0–15.0)
MCH: 32.8 pg (ref 26.0–34.0)
MCHC: 31.3 g/dL (ref 30.0–36.0)
MCV: 104.8 fL — ABNORMAL HIGH (ref 80.0–100.0)
Platelets: 211 10*3/uL (ref 150–400)
RBC: 3.11 MIL/uL — ABNORMAL LOW (ref 3.87–5.11)
RDW: 15.9 % — ABNORMAL HIGH (ref 11.5–15.5)
WBC: 12.6 10*3/uL — ABNORMAL HIGH (ref 4.0–10.5)
nRBC: 0 % (ref 0.0–0.2)

## 2022-05-18 LAB — PROCALCITONIN: Procalcitonin: <0.1 ng/mL

## 2022-05-18 MED ORDER — MEDIHONEY WOUND/BURN DRESSING EX PSTE
1.0000 | PASTE | Freq: Every day | CUTANEOUS | Status: DC
  Administered 2022-05-18 – 2022-05-19 (×2): 1 via TOPICAL
  Filled 2022-05-18: qty 44

## 2022-05-18 MED ORDER — ORAL CARE MOUTH RINSE
15.0000 mL | OROMUCOSAL | Status: DC
  Administered 2022-05-18 – 2022-05-19 (×6): 15 mL via OROMUCOSAL

## 2022-05-18 MED ORDER — ORAL CARE MOUTH RINSE: 15.0000 mL | OROMUCOSAL | Status: DC | PRN

## 2022-05-18 MED ORDER — GERHARDT'S BUTT CREAM
TOPICAL_CREAM | Freq: Every day | CUTANEOUS | Status: DC
  Administered 2022-05-18: 1 via TOPICAL
  Filled 2022-05-18: qty 1

## 2022-05-18 NOTE — Plan of Care (Signed)
?  Problem: Clinical Measurements: ?Goal: Ability to avoid or minimize complications of infection will improve ?Outcome: Progressing ?  ?Problem: Skin Integrity: ?Goal: Skin integrity will improve ?Outcome: Progressing ?  ?

## 2022-05-18 NOTE — Consult Note (Signed)
[facility] Nurse Consult Note: Reason for Consult: Consult requested for right middle back.  Pt has kyphosis and it is difficult to reduce pressure from the spine bone areas rubbing against the skin from underneath.  Unstageable pressure injury in patchy scattered areas, across 4X3X.1cm, 80% yellow, 20% red and moist, small amt tan drainage, painful to touch.  Pressure Injury POA: Yes Dressing procedure/placement/frequency: Topical treatment orders provided for bedside nurses to perform as follows to assist with removal of nonviable tissue: Apply Medihoney to right middle back wound Q day, then cover with foam dressing.  (Change foam dressing Q 3 days or PRN soiling.) Please re-consult if further assistance is needed.  Thank-you,  Julien Girt MSN, [place], [place], [place], [place]

## 2022-05-18 NOTE — Plan of Care (Signed)
  Problem: Education: Goal: Knowledge of General Education information will improve Description: Including pain rating scale, medication(s)/side effects and non-pharmacologic comfort measures Outcome: Completed/Met

## 2022-05-18 NOTE — TOC Initial Note (Addendum)
Transition of Care [facility]) - Initial/Assessment Note    Patient Details  Name: Natalie West MRN: [id number] Date of Birth: [birth year]  Transition of Care [facility]) CM/SW Contact:    Tom-Johnson, Renea Ee, RN Phone Number: 05/18/2022, 1:40 PM  Clinical Narrative:                  CM spoke with patient at bedside about needs for post hospital transition. Patient not abe to answer questions correctly. Gave CM permission to call [place] Caller.  Admitted for ARF with Hypoxia,on IV abx.  CM called and spoke with [name] at [phone]). [name] states patient lives at home with no family members but has 24 hr PCS from [employer]. Has a disabled son who lives nearby. States patient is active with [facility] for PT/OT/RN disciplines. Patient has a wound to hr back and RN is doing wound care. Has a walker, rollator, shower seat, wheelchair, hospital bed and hoyer lift. Greg requesting for a different hospital bed as the current bed is not adjusting right for patient's comfort. CM contacted Jodell Cipro with [employer] and she will call [name]. CM notified Jen with [facility] about patient's admission.  PCP is Christain Sacramento, MD [facility] [place].  No PT/OT needs or recommendations noted at this time. CM will continue to follow with need.    Barriers to Discharge: Continued Medical Work up   Patient Goals and CMS Choice Patient states their goals for this hospitalization and ongoing recovery are:: To return home CMS Medicare.gov Compare Post Acute Care list provided to:: Patient Choice offered to / list presented to : Patient, Adult Children Alanson Puls, [name])  Expected Discharge Plan and Services     Discharge Planning Services: CM Consult   Living arrangements for the past 2 months: Single Family Home                 DME Arranged: N/A DME Agency: NA                  Prior Living Arrangements/Services Living arrangements for the past 2 months: [place] Lives with::  Other (Comment) (PCS through [facility] care) Patient language and need for interpreter reviewed:: Yes Do you feel safe going back to the place where you live?: Yes      Need for Family Participation in Patient Care: Yes (Comment) Care giver support system in place?: Yes (comment) Current home services: DME Gilford Rile, Rollator, Civil engineer, contracting, Wheelchair, Hospital bed and [employer].) Criminal Activity/Legal Involvement Pertinent to Current Situation/Hospitalization: No - Comment as needed  Activities of Daily Living      Permission Sought/Granted Permission sought to share information with : Tourist information centre manager, Customer service manager, Family Supports Permission granted to share information with : Yes, Verbal Permission Granted              Emotional Assessment Appearance:: Appears stated age Attitude/Demeanor/Rapport: Gracious Affect (typically observed): Calm Orientation: : Oriented to Self Alcohol / Substance Use: Not Applicable Psych Involvement: No (comment)  Admission diagnosis:  CAP (community acquired pneumonia) [J18.9] Cellulitis of back except buttock [L03.312] Community acquired pneumonia, unspecified laterality [J18.9] Sepsis with acute organ dysfunction, due to unspecified organism, unspecified type, unspecified whether septic shock present ([place]) [A41.9, R65.20] Pneumonia [J18.9] Patient Active Problem List   Diagnosis Date Noted   Pneumonia 05/17/2022   CAP (community acquired pneumonia) 05/16/2022   Acute respiratory failure with hypoxia ([place]) 05/16/2022   Cellulitis 05/16/2022   History of pulmonary embolism  05/05/2022   Atrial fibrillation, chronic ([place]) 05/03/2022   Essential hypertension 05/03/2022   Protein-calorie malnutrition, moderate ([place]) 05/03/2022   Depression 05/03/2022   Acute on chronic diastolic CHF (congestive heart failure) ([place]) 05/02/2022   Pressure injury of skin 04/14/2022   Cervical spine fracture ([place]) 01/21/2022   Mallet finger of  left finger(s) 10/08/2016   Recurrent urinary tract infection 01/20/2015   Cystocele 01/20/2015   Constipation 08/22/2014   Rotator cuff (capsule) sprain 02/28/2013   Breast cancer of lower-outer quadrant of right female breast [facility])    Internal hemorrhoids 06/28/2011   Gastroenteritis 06/28/2011   Hyperlipidemia 02/18/2008   GERD 02/18/2008   HIATAL HERNIA 02/18/2008   OSTEOARTHRITIS 02/18/2008   COLONIC POLYPS, ADENOMATOUS 02/09/2007   DIVERTICULITIS, ACUTE 02/09/2007   ESOPHAGEAL STRICTURE 11/19/1998   PCP:  Christain Sacramento, MD Pharmacy:   [id number] [place], [place] - [address] [place]  [id number]: [phone]: [phone]    Social Determinants of Health (SDOH) Interventions    Readmission Risk Interventions    04/16/2022   12:20 PM  Readmission Risk Prevention Plan  Transportation Screening Complete  PCP or Specialist Appt within 5-7 Days Complete  Home Care Screening Complete  Medication Review (RN CM) Complete

## 2022-05-18 NOTE — Progress Notes (Signed)
  Progress Note   Patient: Natalie West CVE:[id number] DOB: 12/31/1930 DOA: 05/16/2022     1 DOS: the patient was seen and examined on 05/18/2022   Brief hospital course: 88 y.o. female with medical history significant of atrial fibrillation, hypertension, depression, pulmonary embolus, hyperlipidemia, breast cancer, GERD, recent admission for pneumonia presenting with shortness of breath and cough  Assessment and Plan: Acute respiratory failure with hypoxia Pneumonia ?Developing sepsis > Patient presenting with worsening cough and shortness of breath for the past couple of days becoming severe today. > Noted to have leukocytosis and recently treated for pneumonia with similar changes on CT chest as 2 weeks ago. > Does have history of pulmonary embolus this year on anticoagulation but was unsure if she has been taking it for the last few days.  No PE on CT chest. > A degree of edema is on the differential for groundglass opacities and she does have mildly elevated BNP however given her elevated lactic acid and concern for developing sepsis patient received fluid bolus in the ED and remains on IV fluids. - Continue with ceftriaxone and azithromycin  - Remained on 3LNC this AM. Pt is O2 naive. Wean O2 as tolerated - WBC improving, down to 12.6k -Lactate peaked 6.2, down to 2.7 with IVF  Cellulitis Pressure injury > Noted to have chronic pressure injury at low back.  Has changes consistent with cellulitis, and CT findings consistent with soft tissue swelling overlying the region from T8-T10. - Cont on rocephin per above   Atrial fibrillation - Continue home metoprolol and Eliquis - rate controlled  Hypertension - Continue home metoprolol - BP stable and controlled  History of pulmonary embolus - Continue home Eliquis  Hyperlipidemia - Continue home pravastatin  GERD - Continue home PPI  History of breast cancer - stable  ?CHF > There is some possible CHF concern during recent  admission.  Echo showed EF 55-60%, indeterminate diastolic function and normal RV function.  Is currently prescribed Lasix only as needed. -Continue to monitor       Subjective: Pleasantly confused this AM  Physical Exam: Vitals:   05/18/22 0200 05/18/22 0556 05/18/22 0847 05/18/22 1652  BP: 109/60 (!) 99/57 (!) 96/57 112/68  Pulse: 84 75 98 94  Resp: '18 18 15 16  '$ Temp: 98.6 F (37 C) 97.8 F (36.6 C) 97.7 F (36.5 C) 98.4 F (36.9 C)  TempSrc: Axillary Axillary Oral Oral  SpO2: 100% 98% 100% 99%   General exam: Conversant, in no acute distress Respiratory system: normal chest rise, clear, no audible wheezing Cardiovascular system: regular rhythm, s1-s2 Gastrointestinal system: Nondistended, nontender, pos BS Central nervous system: No seizures, no tremors Extremities: No cyanosis, no joint deformities Skin: No rashes, no pallor Psychiatry: Affect normal // no auditory hallucinations   Data Reviewed:  Labs reviewed: Na 139, K 4.0, Cr 0.7.2, Lactic acid 2.2, WBC 12.6  Family Communication: Pt in room, family not at bedside, recently called nephew over phone  Disposition: Status is: Inpatient Continue inpatient stay because: severity of illness  Planned Discharge Destination: Home    Author: Marylu Lund, MD 05/18/2022 6:10 PM  For on call review [email or web address]

## 2022-05-19 DIAGNOSIS — I482 Chronic atrial fibrillation, unspecified: Secondary | ICD-10-CM | POA: Diagnosis not present

## 2022-05-19 DIAGNOSIS — J9601 Acute respiratory failure with hypoxia: Secondary | ICD-10-CM | POA: Diagnosis not present

## 2022-05-19 LAB — COMPREHENSIVE METABOLIC PANEL
ALT: 11 U/L (ref 0–44)
AST: 15 U/L (ref 15–41)
Albumin: 2.6 g/dL — ABNORMAL LOW (ref 3.5–5.0)
Alkaline Phosphatase: 47 U/L (ref 38–126)
Anion gap: 8 (ref 5–15)
BUN: 16 mg/dL (ref 8–23)
CO2: 21 mmol/L — ABNORMAL LOW (ref 22–32)
Calcium: 8.1 mg/dL — ABNORMAL LOW (ref 8.9–10.3)
Chloride: 110 mmol/L (ref 98–111)
Creatinine, Ser: 0.73 mg/dL (ref 0.44–1.00)
GFR, Estimated: >60 mL/min (ref >60)
Glucose, Bld: 89 mg/dL (ref 70–99)
Potassium: 4.1 mmol/L (ref 3.5–5.1)
Sodium: 139 mmol/L (ref 135–145)
Total Bilirubin: 0.3 mg/dL (ref 0.3–1.2)
Total Protein: 5.3 g/dL — ABNORMAL LOW (ref 6.5–8.1)

## 2022-05-19 LAB — CBC
HCT: 33.2 % — ABNORMAL LOW (ref 36.0–46.0)
Hemoglobin: 10.5 g/dL — ABNORMAL LOW (ref 12.0–15.0)
MCH: 33.3 pg (ref 26.0–34.0)
MCHC: 31.6 g/dL (ref 30.0–36.0)
MCV: 105.4 fL — ABNORMAL HIGH (ref 80.0–100.0)
Platelets: 218 10*3/uL (ref 150–400)
RBC: 3.15 MIL/uL — ABNORMAL LOW (ref 3.87–5.11)
RDW: 15.8 % — ABNORMAL HIGH (ref 11.5–15.5)
WBC: 11.5 10*3/uL — ABNORMAL HIGH (ref 4.0–10.5)
nRBC: 0 % (ref 0.0–0.2)

## 2022-05-19 LAB — PROCALCITONIN: Procalcitonin: <0.1 ng/mL

## 2022-05-19 MED ORDER — AZITHROMYCIN 500 MG PO TABS
250.0000 mg | ORAL_TABLET | Freq: Every day | ORAL | Status: DC
  Administered 2022-05-19: 250 mg via ORAL
  Filled 2022-05-19: qty 1

## 2022-05-19 MED ORDER — CEFDINIR 300 MG PO CAPS: 300.0000 mg | ORAL_CAPSULE | Freq: Two times a day (BID) | ORAL | 0 refills | Status: AC

## 2022-05-19 MED ORDER — AZITHROMYCIN 250 MG PO TABS: ORAL_TABLET | ORAL | 0 refills | Status: DC

## 2022-05-19 MED ORDER — MEDIHONEY WOUND/BURN DRESSING EX PSTE: 1.0000 | PASTE | Freq: Every day | CUTANEOUS | 0 refills | Status: DC

## 2022-05-19 MED ORDER — BENZONATATE 100 MG PO CAPS: 100.0000 mg | ORAL_CAPSULE | Freq: Three times a day (TID) | ORAL | 0 refills | Status: DC

## 2022-05-19 MED ORDER — CEFDINIR 300 MG PO CAPS
300.0000 mg | ORAL_CAPSULE | Freq: Two times a day (BID) | ORAL | Status: DC
  Administered 2022-05-19: 300 mg via ORAL
  Filled 2022-05-19: qty 1

## 2022-05-19 NOTE — Discharge Summary (Signed)
Physician Discharge Summary   Patient: IONIA SCHEY MRN: [id number] DOB: [birth year]  Admit date:     05/16/2022  Discharge date: 05/19/22  Discharge Physician: Marylu Lund   PCP: Christain Sacramento, MD   Recommendations at discharge:    Follow up with PCP in 1-2 weeks Wound care: Apply Medihoney to right middle back wound Q day, then cover with foam dressing.  (Change foam dressing Q 3 days or PRN soiling.) Per SLP: Diet recommendations: Dysphagia 3 (mechanical soft);Regular Liquids provided via: Cup;Straw Medication Administration: Whole meds with puree Supervision: Patient able to self feed Compensations: Minimize environmental distractions Postural Changes and/or Swallow Maneuvers: Seated upright 90 degrees  Discharge Diagnoses: Principal Problem:   Acute respiratory failure with hypoxia ([place]) Active Problems:   Atrial fibrillation, chronic (HCC)   Essential hypertension   Depression   History of pulmonary embolism   Hyperlipidemia   GERD   Breast cancer of lower-outer quadrant of right female breast (HCC)   Pressure injury of skin   CAP (community acquired pneumonia)   Cellulitis   Pneumonia  Resolved Problems:   * No resolved hospital problems. *  Hospital Course: 88 y.o. female with medical history significant of atrial fibrillation, hypertension, depression, pulmonary embolus, hyperlipidemia, breast cancer, GERD, recent admission for pneumonia presenting with shortness of breath and cough  Assessment and Plan: Acute respiratory failure with hypoxia Pneumonia ?Developing sepsis > Patient presenting with worsening cough and shortness of breath for the past couple of days  > Noted to have leukocytosis and recently treated for pneumonia with similar changes on CT chest as 2 weeks ago. >  No PE on CT chest. - Continued with ceftriaxone and azithromycin. Discharge on omnicef and azithromycin to complete course - O2 was successfully weaned to room air - WBC improving,  down to 11.5k -Lactate improved with IVF  Cellulitis Pressure injury > Noted to have chronic pressure injury at low back.  Has changes consistent with cellulitis, and CT findings consistent with soft tissue swelling overlying the region from T8-T10. - Cont medihoney per WOC rec. On abx per above   Atrial fibrillation - Continue home metoprolol and Eliquis - rate controlled  Hypertension - Continue home metoprolol - BP stable and controlled  History of pulmonary embolus - Continue home Eliquis  Hyperlipidemia - Continue home pravastatin  GERD - Continue home PPI  History of breast cancer - remained stable  Chronic diastolic CHF > Echo showed EF 55-60%, indeterminate diastolic function and normal RV function.  Is currently prescribed Lasix only as needed. -Remained stable -Continue to monitor        Consultants:  Procedures performed:   Disposition: Home Diet recommendation:  Dysphagia 3 with nectar thick liquids  DISCHARGE MEDICATION: Allergies as of 05/19/2022       Reactions   Cipro [ciprofloxacin Hcl] Other (See Comments)   Unknown reaction   Cleocin [clindamycin] Other (See Comments)   Unknown reaction   Lincocin [lincomycin Hcl] Other (See Comments)   Unknown reaction   Codeine Nausea And Vomiting   Flexeril [cyclobenzaprine Hcl] Other (See Comments)   Unknown reaction   Hydrocodone Other (See Comments)   "Last time it was too strong and messed up my mind"   Macrobid [nitrofurantoin] Other (See Comments)   Unknown reaction   Nalfon [fenoprofen Calcium] Other (See Comments)   Unknown reaction   Noroxin [norfloxacin] Other (See Comments)   Unknown reaction   Relafen [nabumetone] Other (See Comments)   Unknown reaction   Sulfa  Antibiotics Other (See Comments)   Unknown reaction   Voltaren [diclofenac] Other (See Comments)   Unknown reaction        Medication List     STOP taking these medications    potassium chloride 10 MEQ  tablet Commonly known as: KLOR-CON M   senna-docusate 8.6-50 MG tablet Commonly known as: Senokot-S       TAKE these medications    acetaminophen 325 MG tablet Commonly known as: TYLENOL Take 2 tablets (650 mg total) by mouth every 6 (six) hours as needed. What changed: reasons to take this   albuterol (2.5 MG/3ML) 0.083% nebulizer solution Commonly known as: PROVENTIL Take 2.5 mg by nebulization every 4 (four) hours as needed for wheezing or shortness of breath.   amitriptyline 25 MG tablet Commonly known as: ELAVIL Take 1 tablet (25 mg total) by mouth in the morning and at bedtime.   apixaban 5 MG Tabs tablet Commonly known as: ELIQUIS Take 1 tablet (5 mg total) by mouth 2 (two) times daily.   azithromycin 250 MG tablet Commonly known as: Zithromax 1 tab po daily x 3 more days   benzonatate 100 MG capsule Commonly known as: TESSALON Take 1 capsule (100 mg total) by mouth every 8 (eight) hours.   cefdinir 300 MG capsule Commonly known as: OMNICEF Take 1 capsule (300 mg total) by mouth 2 (two) times daily for 3 days.   furosemide 20 MG tablet Commonly known as: LASIX Take 1 tablet (20 mg total) by mouth daily as needed for fluid or edema.   Jardiance 10 MG Tabs tablet Generic drug: empagliflozin Take 1 tablet (10 mg total) by mouth daily.   leptospermum manuka honey Pste paste Apply 1 Application topically daily. Start taking on: May 20, 2022   megestrol 400 MG/10ML suspension Commonly known as: MEGACE Take 10 mLs (400 mg total) by mouth 2 (two) times daily.   metoprolol tartrate 25 MG tablet Commonly known as: LOPRESSOR Take 1 tablet (25 mg total) by mouth 2 (two) times daily.   mirtazapine 15 MG tablet Commonly known as: REMERON Take 1 tablet (15 mg total) by mouth at bedtime.   multivitamin with minerals Tabs tablet Take 1 tablet by mouth daily. Centrum Silver for Women   pantoprazole 40 MG tablet Commonly known as: PROTONIX Take 1 tablet (40  mg total) by mouth daily. 1 Tablet Daily for Heartburn and Acid Reflux What changed:  when to take this additional instructions   polyethylene glycol 17 g packet Commonly known as: MIRALAX / GLYCOLAX Take 17 g by mouth 2 (two) times daily as needed for mild constipation. What changed: when to take this   pravastatin 20 MG tablet Commonly known as: PRAVACHOL Take 20 mg by mouth at bedtime.   sertraline 25 MG tablet Commonly known as: ZOLOFT Take 1 tablet (25 mg total) by mouth daily.        Follow-up Information     Christain Sacramento, MD Follow up in 1 week(s).   Specialty: Family Medicine Why: Hospital follow up Contact information: [address] [place] [address] [phone]         Troy Sine, MD .   Specialty: Cardiology Contact information: [address] [place] [address] [phone]                Discharge Exam: There were no vitals filed for this visit. General exam: Awake, laying in bed, in nad Respiratory system: Normal respiratory effort, no  wheezing Cardiovascular system: regular rate, s1, s2 Gastrointestinal system: Soft, nondistended, positive BS Central nervous system: CN2-12 grossly intact, strength intact Extremities: Perfused, no clubbing Skin: Normal skin turgor, no notable skin lesions seen Psychiatry: Mood normal // no visual hallucinations   Condition at discharge: fair  The results of significant diagnostics from this hospitalization (including imaging, microbiology, ancillary and laboratory) are listed below for reference.   Imaging Studies: CT Angio Chest PE W and/or Wo Contrast  Addendum Date: 05/16/2022   ADDENDUM REPORT: 05/16/2022 23:23 ADDENDUM: This addendum is to add an additional findings/impression. There is skin and soft tissues thickening overlying the left posterior aspect of T8 through T10 vertebral body and spinous processes. No well-defined or peripherally enhancing collection to  suggest abscess. No soft tissue gas. There is no associated bony destructive change to suggest osteomyelitis. Suspect cellulitis. Electronically Signed   By: Keith Rake M.D.   On: 05/16/2022 23:23   Result Date: 05/16/2022 CLINICAL DATA:  Pulmonary embolism (PE) suspected, high prob Prior pulm embolism, says she may not taken her blood thinners this week. Also cough and chills with recent pneumonia. Also erythematous area on her back concerning for infection EXAM: CT ANGIOGRAPHY CHEST WITH CONTRAST TECHNIQUE: Multidetector CT imaging of the chest was performed using the standard protocol during bolus administration of intravenous contrast. Multiplanar CT image reconstructions and MIPs were obtained to evaluate the vascular anatomy. RADIATION DOSE REDUCTION: This exam was performed according to the departmental dose-optimization program which includes automated exposure control, adjustment of the mA and/or kV according to patient size and/or use of iterative reconstruction technique. CONTRAST:  73m OMNIPAQUE IOHEXOL 350 MG/ML SOLN COMPARISON:  Radiograph earlier today. Chest CTA 2 weeks ago 05/02/2022, also 04/12/2022 FINDINGS: Cardiovascular: No filling defects in the pulmonary arteries to suggest pulmonary embolus. Dilated main right pulmonary artery at 3.8 cm, mild dilated left pulmonary artery at 2.7 cm. Aortic atherosclerosis and tortuosity. Cannot assess for dissection given phase of contrast tailored to pulmonary arteries ses mint multi chamber cardiomegaly. There are coronary artery calcifications no pericardial effusion. Mediastinum/Nodes: Shotty and mildly enlarged mediastinal and hilar lymph nodes, largest in the subcarinal station measuring 11 mm. Decompressed esophagus. Lungs/Pleura: Again seen heterogeneous areas of ground-glass opacity and septal thickening with a slight upper lobe predominant distribution. Similar small bilateral pleural effusions to prior exam. Again seen areas of subpleural  reticulation and traction bronchiectasis. There is prominent central bronchial thickening areas of mucoid impaction in the lower lobes. Upper Abdomen: No acute findings. Small cyst in the upper right kidney, needs no further follow-up. Musculoskeletal: Exaggerated thoracic kyphosis. Stable bone island within T1 vertebral body. Subacute anterior rib fractures. No acute osseous findings. Review of the MIP images confirms the above findings. IMPRESSION: 1. No pulmonary embolus. 2. Persistent heterogeneous areas of ground-glass opacity and septal thickening with a slight upper lobe predominant distribution. This is without significant interval change from exam 2 weeks ago. Differential considerations include pulmonary edema versus atypical infection or interstitial lung disease. 3. Small bilateral pleural effusions. 4. Prominent central bronchial thickening with areas of mucoid impaction in the lower lobes. 5. Cardiomegaly with coronary artery calcifications. Dilated main pulmonary artery suggesting pulmonary arterial hypertension. 6. Shotty and mildly enlarged mediastinal and hilar lymph nodes are likely reactive. Aortic Atherosclerosis (ICD10-I70.0). Electronically Signed: By: MKeith RakeM.D. On: 05/16/2022 23:14   DG Chest Port 1 View  Result Date: 05/16/2022 CLINICAL DATA:  Questionable sepsis. EXAM: PORTABLE CHEST 1 VIEW COMPARISON:  Chest radiograph dated 05/02/2022. FINDINGS: Diffuse  chronic truck coarsening. No consolidative changes. Probable trace bilateral pleural effusions and bibasilar atelectasis. No pneumothorax. Stable cardiomegaly. Atherosclerotic calcification of the aorta. No acute osseous pathology. IMPRESSION: 1. Probable trace bilateral pleural effusions and bibasilar atelectasis. 2. Stable cardiomegaly. Electronically Signed   By: Anner Crete M.D.   On: 05/16/2022 21:49   CT Angio Chest PE W and/or Wo Contrast  Result Date: 05/02/2022 CLINICAL DATA:  Pulmonary embolism (PE)  suspected, high prob Recent pneumonia and recent PE. Worsened shortness of breath, tachycardia, and hypoxia. Rule out new pulmonary emboli or large pneumonia. Also concern for aspiration today EXAM: CT ANGIOGRAPHY CHEST WITH CONTRAST TECHNIQUE: Multidetector CT imaging of the chest was performed using the standard protocol during bolus administration of intravenous contrast. Multiplanar CT image reconstructions and MIPs were obtained to evaluate the vascular anatomy. RADIATION DOSE REDUCTION: This exam was performed according to the departmental dose-optimization program which includes automated exposure control, adjustment of the mA and/or kV according to patient size and/or use of iterative reconstruction technique. CONTRAST:  25m OMNIPAQUE IOHEXOL 350 MG/ML SOLN COMPARISON:  CT dated April 12, 2022 common January 21, 2022 FINDINGS: Cardiovascular: Evaluation is limited secondary to respiratory motion, particularly at the bases. Cardiomegaly. No large pericardial effusion. Atherosclerotic calcifications of the nonaneurysmal thoracic aorta. Main pulmonary artery is enlarged in comparison to the ascending thoracic aorta which may reflect a degree of underlying pulmonary arterial hypertension. Previously described filling defects of the RIGHT lower lobe and LEFT main pulmonary artery are not discretely visualized on today's exam. No new large pulmonary embolism. Mediastinum/Nodes: Status post RIGHT axillary node dissection. No new suspicious axillary adenopathy. Prominent mediastinal lymph nodes with representative RIGHT paratracheal lymph node measuring 10 mm in the short axis (series 6, image 88). This is not significantly changed in comparison to prior. Representative LEFT hilar lymph node superior to the LEFT upper lobe pulmonary artery measures 10 mm, similar in comparison to prior (series 6, image 80) Lungs/Pleura: Small pleural effusions. Bronchial wall thickening. Apical predominant ground-glass opacities with  suggestion of interlobular septal thickening on a background of subpleural reticulation and traction bronchiectasis. Ground-glass opacities are similar 2 mildly increased in comparison to prior CT but increased since March 2023. Upper Abdomen: Small hiatal hernia. Musculoskeletal: Healing LEFT anterior fourth through sixth rib fractures. Healing RIGHT anterior third and fourth rib fractures. Unchanged wedging of T3. Bone island of T1. Exaggerated thoracic kyphosis. Review of the MIP images confirms the above findings. IMPRESSION: 1. Previously described pulmonary emboli are no longer discretely visualized. No new large pulmonary embolism within the limitations of this motion filled exam. 2. Apical predominant ground-glass opacities with suggestion of interlobular septal thickening superimposed on a background of pulmonary fibrosis. Overall, this is increased since March 2023 and is similar to mildly increased in comparison to most recent prior from June 2023. This is favored to reflect pulmonary edema given presence of bronchial wall thickening and small bilateral pleural effusions. Differential considerations include atypical infection or progressive pulmonary interstitial lung disease such as hypersensitivity pneumonitis. 3. Enlargement of the pulmonary artery as can be seen in pulmonary arterial hypertension. 4. Mediastinal and hilar adenopathy, likely reactive. 5. Healing bilateral anterior rib fractures. Aortic Atherosclerosis (ICD10-I70.0). Electronically Signed   By: SValentino SaxonM.D.   On: 05/02/2022 16:06   DG Chest 2 View  Result Date: 05/02/2022 CLINICAL DATA:  cp/aspiration EXAM: CHEST - 2 VIEW COMPARISON:  April 24, 2022, April 12 2022 FINDINGS: The cardiomediastinal silhouette is unchanged and enlarged in contour.Atherosclerotic calcifications of  the aorta. Small bilateral pleural effusions. No pneumothorax. Similar appearance of diffuse reticular prominence likely reflecting underlying  pulmonary fibrosis. Mild reticulonodularity within the RIGHT lower lung. Visualized abdomen is unremarkable. Multilevel degenerative changes of the thoracic spine; osteopenia limits evaluation. Surgical clips project over the RIGHT breast. IMPRESSION: Mild reticulonodularity of the RIGHT lung base may reflect sequela of aspiration. Bilateral pleural effusions with similar appearance of background of likely pulmonary fibrosis. Electronically Signed   By: Valentino Saxon M.D.   On: 05/02/2022 13:16   DG Chest Portable 1 View  Result Date: 04/24/2022 CLINICAL DATA:  Shortness of breath and hypoxia. EXAM: PORTABLE CHEST 1 VIEW COMPARISON:  Chest radiograph and CTA 04/12/2022 FINDINGS: The patient's chin obscures portions of the lung apices, more notable on the right. The cardiac silhouette remains enlarged. Aortic atherosclerosis is noted. Lung volumes are low with chronic interstitial coarsening. Hazy bilateral upper lobe opacities on the prior radiograph have slightly improved. No sizable pleural effusion or pneumothorax is identified. No acute osseous abnormality is seen. IMPRESSION: Slight improvement of bilateral upper lobe infiltrates superimposed on chronic lung disease. Electronically Signed   By: Logan Bores M.D.   On: 04/24/2022 16:18    Microbiology: Results for orders placed or performed during the hospital encounter of 05/16/22  Blood culture (routine x 2)     Status: None (Preliminary result)   Collection Time: 05/16/22  9:25 PM   Specimen: BLOOD LEFT HAND  Result Value Ref Range Status   Specimen Description BLOOD LEFT HAND  Final   Special Requests   Final    BOTTLES DRAWN AEROBIC AND ANAEROBIC Blood Culture adequate volume   Culture   Final    NO GROWTH 3 DAYS Performed at [facility], [place] [address] [place], [place] [address]    Report Status PENDING  Incomplete  Blood culture (routine x 2)     Status: None (Preliminary result)   Collection Time: 05/16/22  9:30 PM    Specimen: BLOOD  Result Value Ref Range Status   Specimen Description BLOOD LEFT ANTECUBITAL  Final   Special Requests   Final    BOTTLES DRAWN AEROBIC AND ANAEROBIC Blood Culture adequate volume   Culture   Final    NO GROWTH 3 DAYS Performed at [facility], [place] [address], [place],  [address]    Report Status PENDING  Incomplete  Resp Panel by RT-PCR (Flu A&B, Covid) Anterior Nasal Swab     Status: None   Collection Time: 05/16/22  9:49 PM   Specimen: Anterior Nasal Swab  Result Value Ref Range Status   SARS Coronavirus 2 by RT PCR NEGATIVE NEGATIVE Final    Comment: (NOTE) SARS-CoV-2 target nucleic acids are NOT DETECTED.  The SARS-CoV-2 RNA is generally detectable in upper respiratory specimens during the acute phase of infection. The lowest concentration of SARS-CoV-2 viral copies this assay can detect is 138 copies/mL. A negative result does not preclude SARS-Cov-2 infection and should not be used as the sole basis for treatment or other patient management decisions. A negative result may occur with  improper specimen collection/handling, submission of specimen other than nasopharyngeal swab, presence of viral mutation(s) within the areas targeted by this assay, and inadequate number of viral copies(<138 copies/mL). A negative result must be combined with clinical observations, patient history, and epidemiological information. The expected result is Negative.  Fact Sheet for Patients:  EntrepreneurPulse.com.au  Fact Sheet for Healthcare Providers:  IncredibleEmployment.be  This test is no t  yet approved or cleared by the [place] and  has been authorized for detection and/or diagnosis of SARS-CoV-2 by FDA under an Emergency Use Authorization (EUA). This EUA will remain  in effect (meaning this test can be used) for the duration of the COVID-19 declaration under Section 564(b)(1) of the Act, 21 U.S.C.section  360bbb-3(b)(1), unless the authorization is terminated  or revoked sooner.       Influenza A by PCR NEGATIVE NEGATIVE Final   Influenza B by PCR NEGATIVE NEGATIVE Final    Comment: (NOTE) The Xpert Xpress SARS-CoV-2/FLU/RSV plus assay is intended as an aid in the diagnosis of influenza from Nasopharyngeal swab specimens and should not be used as a sole basis for treatment. Nasal washings and aspirates are unacceptable for Xpert Xpress SARS-CoV-2/FLU/RSV testing.  Fact Sheet for Patients: EntrepreneurPulse.com.au  Fact Sheet for Healthcare Providers: IncredibleEmployment.be  This test is not yet approved or cleared by the [place] FDA and has been authorized for detection and/or diagnosis of SARS-CoV-2 by FDA under an Emergency Use Authorization (EUA). This EUA will remain in effect (meaning this test can be used) for the duration of the COVID-19 declaration under Section 564(b)(1) of the Act, 21 U.S.C. section 360bbb-3(b)(1), unless the authorization is terminated or revoked.  Performed at [facility], [place] [address] [place], [place] [address]   Urine Culture     Status: Abnormal   Collection Time: 05/17/22  9:15 AM   Specimen: Urine, Clean Catch  Result Value Ref Range Status   Specimen Description URINE, CLEAN CATCH  Final   Special Requests   Final    NONE Performed at [facility], [place] [address], [place], [place] [address]    Culture MULTIPLE SPECIES PRESENT, SUGGEST RECOLLECTION (A)  Final   Report Status 05/18/2022 FINAL  Final    Labs: CBC: Recent Labs  Lab 05/16/22 2051 05/17/22 0323 05/18/22 0249 05/19/22 0558  WBC 18.6* 17.8* 12.6* 11.5*  NEUTROABS 14.0*  --   --   --   HGB 13.0 10.4* 10.2* 10.5*  HCT 41.9 33.9* 32.6* 33.2*  MCV 108.8* 106.9* 104.8* 105.4*  PLT 311 233 211 382   Basic Metabolic Panel: Recent Labs  Lab 05/16/22 2051 05/17/22 0323 05/18/22 0249 05/19/22 0558  NA 138 141  139 139  K 3.7 3.7 4.0 4.1  CL 106 108 110 110  CO2 19* 20* 21* 21*  GLUCOSE 142* 94 98 89  BUN '20 17 16 16  '$ CREATININE 0.94 0.82 0.72 0.73  CALCIUM 8.5* 8.0* 8.0* 8.1*  MG 2.2  --   --   --    Liver Function Tests: Recent Labs  Lab 05/16/22 2051 05/17/22 0323 05/18/22 0249 05/19/22 0558  AST 18 16 14* 15  ALT '12 11 8 11  '$ ALKPHOS 71 59 52 47  BILITOT 0.3 0.4 0.3 0.3  PROT 6.4* 5.7* 4.9* 5.3*  ALBUMIN 3.2* 2.7* 2.4* 2.6*   CBG: No results for input(s): "GLUCAP" in the last 168 hours.  Discharge time spent: less than 30 minutes.  Signed: Marylu Lund, MD [facility] 05/19/2022

## 2022-05-19 NOTE — TOC Transition Note (Signed)
Transition of Care [facility]) - CM/SW Discharge Note   Patient Details  Name: Natalie West MRN: [id number] Date of Birth: 27-Jul-1931  [facility] CM/SW Contact:  Natalie West, Natalie Ee, RN Phone Number: 05/19/2022, 5:29 PM   Clinical Narrative:     Patient is scheduled for discharge today. Active with [facility] for home health disciplines. No PT/OT recommendations this admit. PTAR scheduled for transportation. No further TOC needs noted.   Final next level of care: Home/Self Care Barriers to Discharge: Barriers Resolved   Patient Goals and CMS Choice Patient states their goals for this hospitalization and ongoing recovery are:: To return home CMS Medicare.gov Compare Post Acute Care list provided to:: Patient Choice offered to / list presented to : Patient, Adult Children Natalie West, Natalie West)  Discharge Placement                Patient to be transferred to facility by: PTAR      Discharge Plan and Services   Discharge Planning Services: CM Consult            DME Arranged: N/A DME Agency: NA                  Social Determinants of Health (SDOH) Interventions     Readmission Risk Interventions    05/19/2022    5:16 PM 04/16/2022   12:20 PM  Readmission Risk Prevention Plan  Transportation Screening Complete Complete  PCP or Specialist Appt within 5-7 Days  Complete  PCP or Specialist Appt within 3-5 Days Complete   Home Care Screening  Complete  Medication Review (RN CM)  Complete  HRI or Home Care Consult Complete   Social Work Consult for [place] Planning/Counseling Complete   Palliative Care Screening Not Applicable   Medication Review Press photographer) Referral to Pharmacy

## 2022-05-20 NOTE — Progress Notes (Signed)
DISCHARGE NOTE HOME Natalie West to be discharged Home per MD order. Discussed prescriptions and follow up appointments with the patient. Prescriptions given to patient; medication list explained in detail. Patient verbalized understanding.  Skin clean, dry and intact without evidence of skin break down, no evidence of skin tears noted. IV catheter discontinued intact. Site without signs and symptoms of complications. Dressing and pressure applied. Pt denies pain at the site currently. No complaints noted.  Patient free of lines, drains, and wounds.   An After Visit Summary (AVS) was printed and given to the patient. Patient escorted via stretcher, and discharged home via [facility].  Sharmon Revere, RN

## 2022-05-21 LAB — CULTURE, BLOOD (ROUTINE X 2)
Culture: NO GROWTH
Culture: NO GROWTH
Special Requests: ADEQUATE
Special Requests: ADEQUATE

## 2022-05-26 ENCOUNTER — Telehealth: Payer: Self-pay

## 2022-05-26 NOTE — Telephone Encounter (Signed)
Attempted to contact patient to reschedule a Palliative Care consult appointment. No answer left a message to return call.  

## 2022-06-02 ENCOUNTER — Inpatient Hospital Stay (HOSPITAL_COMMUNITY)
Admission: EM | Admit: 2022-06-02 | Discharge: 2022-06-13 | DRG: 189 | Disposition: A | Discharge status: Hospice | Payer: Medicare HMO | Attending: Internal Medicine | Admitting: Internal Medicine

## 2022-06-02 ENCOUNTER — Emergency Department (HOSPITAL_COMMUNITY): Payer: Medicare HMO

## 2022-06-02 DIAGNOSIS — B3789 Other sites of candidiasis: Secondary | ICD-10-CM

## 2022-06-02 DIAGNOSIS — I10 Essential (primary) hypertension: Secondary | ICD-10-CM | POA: Diagnosis present

## 2022-06-02 DIAGNOSIS — Z86010 Personal history of colonic polyps: Secondary | ICD-10-CM

## 2022-06-02 DIAGNOSIS — E43 Unspecified severe protein-calorie malnutrition: Secondary | ICD-10-CM | POA: Diagnosis present

## 2022-06-02 DIAGNOSIS — Z515 Encounter for palliative care: Secondary | ICD-10-CM | POA: Diagnosis not present

## 2022-06-02 DIAGNOSIS — F419 Anxiety disorder, unspecified: Secondary | ICD-10-CM | POA: Diagnosis present

## 2022-06-02 DIAGNOSIS — K219 Gastro-esophageal reflux disease without esophagitis: Secondary | ICD-10-CM | POA: Diagnosis present

## 2022-06-02 DIAGNOSIS — N1831 Chronic kidney disease, stage 3a: Secondary | ICD-10-CM | POA: Diagnosis present

## 2022-06-02 DIAGNOSIS — Z20822 Contact with and (suspected) exposure to covid-19: Secondary | ICD-10-CM | POA: Diagnosis present

## 2022-06-02 DIAGNOSIS — E876 Hypokalemia: Secondary | ICD-10-CM | POA: Diagnosis not present

## 2022-06-02 DIAGNOSIS — I482 Chronic atrial fibrillation, unspecified: Secondary | ICD-10-CM | POA: Diagnosis present

## 2022-06-02 DIAGNOSIS — M1990 Unspecified osteoarthritis, unspecified site: Secondary | ICD-10-CM | POA: Diagnosis present

## 2022-06-02 DIAGNOSIS — Z853 Personal history of malignant neoplasm of breast: Secondary | ICD-10-CM

## 2022-06-02 DIAGNOSIS — Z66 Do not resuscitate: Secondary | ICD-10-CM | POA: Diagnosis present

## 2022-06-02 DIAGNOSIS — M8580 Other specified disorders of bone density and structure, unspecified site: Secondary | ICD-10-CM | POA: Diagnosis present

## 2022-06-02 DIAGNOSIS — Z7901 Long term (current) use of anticoagulants: Secondary | ICD-10-CM | POA: Diagnosis not present

## 2022-06-02 DIAGNOSIS — Z86711 Personal history of pulmonary embolism: Secondary | ICD-10-CM | POA: Diagnosis present

## 2022-06-02 DIAGNOSIS — Z79899 Other long term (current) drug therapy: Secondary | ICD-10-CM | POA: Diagnosis not present

## 2022-06-02 DIAGNOSIS — Z882 Allergy status to sulfonamides status: Secondary | ICD-10-CM

## 2022-06-02 DIAGNOSIS — Z87440 Personal history of urinary (tract) infections: Secondary | ICD-10-CM

## 2022-06-02 DIAGNOSIS — Z8701 Personal history of pneumonia (recurrent): Secondary | ICD-10-CM

## 2022-06-02 DIAGNOSIS — J441 Chronic obstructive pulmonary disease with (acute) exacerbation: Secondary | ICD-10-CM | POA: Diagnosis present

## 2022-06-02 DIAGNOSIS — R1310 Dysphagia, unspecified: Secondary | ICD-10-CM | POA: Diagnosis present

## 2022-06-02 DIAGNOSIS — F0390 Unspecified dementia without behavioral disturbance: Secondary | ICD-10-CM | POA: Diagnosis present

## 2022-06-02 DIAGNOSIS — E7800 Pure hypercholesterolemia, unspecified: Secondary | ICD-10-CM | POA: Diagnosis present

## 2022-06-02 DIAGNOSIS — I5033 Acute on chronic diastolic (congestive) heart failure: Secondary | ICD-10-CM | POA: Diagnosis present

## 2022-06-02 DIAGNOSIS — Z7984 Long term (current) use of oral hypoglycemic drugs: Secondary | ICD-10-CM

## 2022-06-02 DIAGNOSIS — Z923 Personal history of irradiation: Secondary | ICD-10-CM

## 2022-06-02 DIAGNOSIS — I130 Hypertensive heart and chronic kidney disease with heart failure and stage 1 through stage 4 chronic kidney disease, or unspecified chronic kidney disease: Secondary | ICD-10-CM | POA: Diagnosis present

## 2022-06-02 DIAGNOSIS — Z881 Allergy status to other antibiotic agents status: Secondary | ICD-10-CM

## 2022-06-02 DIAGNOSIS — Z961 Presence of intraocular lens: Secondary | ICD-10-CM | POA: Diagnosis present

## 2022-06-02 DIAGNOSIS — Z6820 Body mass index (BMI) 20.0-20.9, adult: Secondary | ICD-10-CM

## 2022-06-02 DIAGNOSIS — Z825 Family history of asthma and other chronic lower respiratory diseases: Secondary | ICD-10-CM

## 2022-06-02 DIAGNOSIS — Z9841 Cataract extraction status, right eye: Secondary | ICD-10-CM

## 2022-06-02 DIAGNOSIS — Z7401 Bed confinement status: Secondary | ICD-10-CM

## 2022-06-02 DIAGNOSIS — Z711 Person with feared health complaint in whom no diagnosis is made: Secondary | ICD-10-CM | POA: Diagnosis not present

## 2022-06-02 DIAGNOSIS — Z885 Allergy status to narcotic agent status: Secondary | ICD-10-CM

## 2022-06-02 DIAGNOSIS — H9190 Unspecified hearing loss, unspecified ear: Secondary | ICD-10-CM | POA: Diagnosis present

## 2022-06-02 DIAGNOSIS — Z789 Other specified health status: Secondary | ICD-10-CM | POA: Diagnosis not present

## 2022-06-02 DIAGNOSIS — Z8249 Family history of ischemic heart disease and other diseases of the circulatory system: Secondary | ICD-10-CM

## 2022-06-02 DIAGNOSIS — J9601 Acute respiratory failure with hypoxia: Secondary | ICD-10-CM | POA: Diagnosis not present

## 2022-06-02 DIAGNOSIS — Z9842 Cataract extraction status, left eye: Secondary | ICD-10-CM

## 2022-06-02 DIAGNOSIS — Z993 Dependence on wheelchair: Secondary | ICD-10-CM

## 2022-06-02 DIAGNOSIS — Z7189 Other specified counseling: Secondary | ICD-10-CM | POA: Diagnosis not present

## 2022-06-02 DIAGNOSIS — Z86718 Personal history of other venous thrombosis and embolism: Secondary | ICD-10-CM

## 2022-06-02 LAB — CBC WITH DIFFERENTIAL/PLATELET
Abs Immature Granulocytes: 0.7 10*3/uL — ABNORMAL HIGH (ref 0.00–0.07)
Basophils Absolute: 0.1 10*3/uL (ref 0.0–0.1)
Basophils Relative: 1 %
Eosinophils Absolute: 0.1 10*3/uL (ref 0.0–0.5)
Eosinophils Relative: 1 %
HCT: 39.7 % (ref 36.0–46.0)
Hemoglobin: 12.5 g/dL (ref 12.0–15.0)
Immature Granulocytes: 4 %
Lymphocytes Relative: 13 %
Lymphs Abs: 2.1 10*3/uL (ref 0.7–4.0)
MCH: 34.2 pg — ABNORMAL HIGH (ref 26.0–34.0)
MCHC: 31.5 g/dL (ref 30.0–36.0)
MCV: 108.8 fL — ABNORMAL HIGH (ref 80.0–100.0)
Monocytes Absolute: 1.1 10*3/uL — ABNORMAL HIGH (ref 0.1–1.0)
Monocytes Relative: 6 %
Neutro Abs: 12.7 10*3/uL — ABNORMAL HIGH (ref 1.7–7.7)
Neutrophils Relative %: 75 %
Platelets: 231 10*3/uL (ref 150–400)
RBC: 3.65 MIL/uL — ABNORMAL LOW (ref 3.87–5.11)
RDW: 16.6 % — ABNORMAL HIGH (ref 11.5–15.5)
WBC: 16.8 10*3/uL — ABNORMAL HIGH (ref 4.0–10.5)
nRBC: 0 % (ref 0.0–0.2)

## 2022-06-02 LAB — COMPREHENSIVE METABOLIC PANEL
ALT: 13 U/L (ref 0–44)
AST: 23 U/L (ref 15–41)
Albumin: 3.2 g/dL — ABNORMAL LOW (ref 3.5–5.0)
Alkaline Phosphatase: 51 U/L (ref 38–126)
Anion gap: 4 — ABNORMAL LOW (ref 5–15)
BUN: 22 mg/dL (ref 8–23)
CO2: 22 mmol/L (ref 22–32)
Calcium: 8.3 mg/dL — ABNORMAL LOW (ref 8.9–10.3)
Chloride: 114 mmol/L — ABNORMAL HIGH (ref 98–111)
Creatinine, Ser: 0.99 mg/dL (ref 0.44–1.00)
GFR, Estimated: 55 mL/min — ABNORMAL LOW (ref >60)
Glucose, Bld: 225 mg/dL — ABNORMAL HIGH (ref 70–99)
Potassium: 4.7 mmol/L (ref 3.5–5.1)
Sodium: 140 mmol/L (ref 135–145)
Total Bilirubin: 0.5 mg/dL (ref 0.3–1.2)
Total Protein: 6 g/dL — ABNORMAL LOW (ref 6.5–8.1)

## 2022-06-02 LAB — I-STAT VENOUS BLOOD GAS, ED
Acid-base deficit: 5 mmol/L — ABNORMAL HIGH (ref 0.0–2.0)
Bicarbonate: 19.6 mmol/L — ABNORMAL LOW (ref 20.0–28.0)
Calcium, Ion: 1.08 mmol/L — ABNORMAL LOW (ref 1.15–1.40)
HCT: 37 % (ref 36.0–46.0)
Hemoglobin: 12.6 g/dL (ref 12.0–15.0)
O2 Saturation: 75 %
Potassium: 4 mmol/L (ref 3.5–5.1)
Sodium: 142 mmol/L (ref 135–145)
TCO2: 21 mmol/L — ABNORMAL LOW (ref 22–32)
pCO2, Ven: 32.3 mmHg — ABNORMAL LOW (ref 44–60)
pH, Ven: 7.39 (ref 7.25–7.43)
pO2, Ven: 40 mmHg (ref 32–45)

## 2022-06-02 LAB — TROPONIN I (HIGH SENSITIVITY): Troponin I (High Sensitivity): 24 ng/L — ABNORMAL HIGH (ref <18)

## 2022-06-02 LAB — LIPASE, BLOOD: Lipase: 29 U/L (ref 11–51)

## 2022-06-02 LAB — SARS CORONAVIRUS 2 BY RT PCR: SARS Coronavirus 2 by RT PCR: NEGATIVE

## 2022-06-02 MED ORDER — VANCOMYCIN HCL 500 MG/100ML IV SOLN
500.0000 mg | INTRAVENOUS | Status: DC
Start: 2022-06-03 — End: 2022-06-03

## 2022-06-02 MED ORDER — SODIUM CHLORIDE 0.9 % IV SOLN: 1.0000 g | Freq: Once | INTRAVENOUS | Status: DC

## 2022-06-02 MED ORDER — SODIUM CHLORIDE 0.9 % IV SOLN: 500.0000 mg | Freq: Once | INTRAVENOUS | Status: DC

## 2022-06-02 MED ORDER — LACTATED RINGERS IV BOLUS
500.0000 mL | Freq: Once | INTRAVENOUS | Status: AC
  Administered 2022-06-03: 500 mL via INTRAVENOUS

## 2022-06-02 MED ORDER — LACTATED RINGERS IV SOLN: INTRAVENOUS | Status: DC

## 2022-06-02 MED ORDER — SODIUM CHLORIDE 0.9 % IV SOLN
2.0000 g | INTRAVENOUS | Status: DC
  Administered 2022-06-03: 2 g via INTRAVENOUS
  Filled 2022-06-02: qty 12.5

## 2022-06-02 MED ORDER — NYSTATIN 100000 UNIT/GM EX OINT
TOPICAL_OINTMENT | Freq: Two times a day (BID) | CUTANEOUS | Status: DC
  Filled 2022-06-02 (×2): qty 15

## 2022-06-02 MED ORDER — VANCOMYCIN HCL IN DEXTROSE 1-5 GM/200ML-% IV SOLN
1000.0000 mg | Freq: Once | INTRAVENOUS | Status: AC
Start: 2022-06-02 — End: 2022-06-03
  Administered 2022-06-03: 1000 mg via INTRAVENOUS
  Filled 2022-06-02: qty 200

## 2022-06-02 NOTE — Assessment & Plan Note (Signed)
BP is on the soft side with SBP 98. Hold on antihypertensive for now.  -Give 500cc LR bolus and keep on continuous IV fluid

## 2022-06-02 NOTE — Assessment & Plan Note (Signed)
HR of 130 on arrival and this is improving without intervention to about 110. Continue to monitor on telemetry.

## 2022-06-02 NOTE — ED Provider Notes (Signed)
[facility] EMERGENCY DEPARTMENT Provider Note   CSN: [id number] Arrival date & time: 06/02/22  2039     History Chief Complaint  Patient presents with   Shortness of Breath     Natalie West is a 88 y.o. female presenting for AMS/Respiratory distress.  HPI Patient has an extensive medical history and is an 88 year old female.  EMS was called out secondary to patient being grossly altered despite patient's home health nurse attempting to administer medications at home.  When EMS arrived, patient was only responsive to painful stimuli.  They started her on CPAP, DuoNebs administered x3, 125 mg of Solu-Medrol, 2 mg of magnesium with resulting drop of her exhaled CO2 from 60-30 in transfer. Patient has had improvement of her mental status with the EMS interventions. Notably, patient was recently admitted for similar 2 weeks ago.  Patient unable to provide any further information at this time.     Home Medications Prior to Admission medications   Medication Sig Start Date End Date Taking? Authorizing Provider  acetaminophen (TYLENOL) 325 MG tablet Take 2 tablets (650 mg total) by mouth every 6 (six) hours as needed. Patient taking differently: Take 650 mg by mouth every 6 (six) hours as needed for moderate pain or headache. 01/28/22   Maczis, Barth Kirks, PA-C  albuterol (PROVENTIL) (2.5 MG/3ML) 0.083% nebulizer solution Take 2.5 mg by nebulization every 4 (four) hours as needed for wheezing or shortness of breath. 04/30/22   [provider]  amitriptyline (ELAVIL) 25 MG tablet Take 1 tablet (25 mg total) by mouth in the morning and at bedtime. 04/16/22   Barton Dubois, MD  apixaban (ELIQUIS) 5 MG TABS tablet Take 1 tablet (5 mg total) by mouth 2 (two) times daily. 05/07/22   Shelly Coss, MD  azithromycin (ZITHROMAX) 250 MG tablet 1 tab po daily x 3 more days 05/19/22   Donne Hazel, MD  benzonatate (TESSALON) 100 MG capsule Take 1 capsule (100 mg total) by mouth  every 8 (eight) hours. 05/19/22   Donne Hazel, MD  empagliflozin (JARDIANCE) 10 MG TABS tablet Take 1 tablet (10 mg total) by mouth daily. 05/08/22   Shelly Coss, MD  furosemide (LASIX) 20 MG tablet Take 1 tablet (20 mg total) by mouth daily as needed for fluid or edema. 04/16/22   Barton Dubois, MD  leptospermum manuka honey (MEDIHONEY) PSTE paste Apply 1 Application topically daily. 05/20/22   Donne Hazel, MD  megestrol (MEGACE) 400 MG/10ML suspension Take 10 mLs (400 mg total) by mouth 2 (two) times daily. 04/16/22 06/15/22  Barton Dubois, MD  metoprolol tartrate (LOPRESSOR) 25 MG tablet Take 1 tablet (25 mg total) by mouth 2 (two) times daily. 05/07/22   Shelly Coss, MD  mirtazapine (REMERON) 15 MG tablet Take 1 tablet (15 mg total) by mouth at bedtime. 04/16/22   Barton Dubois, MD  Multiple Vitamin (MULTIVITAMIN WITH MINERALS) TABS tablet Take 1 tablet by mouth daily. Centrum Silver for Women    [provider]  pantoprazole (PROTONIX) 40 MG tablet Take 1 tablet (40 mg total) by mouth daily. 1 Tablet Daily for Heartburn and Acid Reflux Patient taking differently: Take 40 mg by mouth every evening. 04/16/22   Barton Dubois, MD  polyethylene glycol (MIRALAX / GLYCOLAX) 17 g packet Take 17 g by mouth 2 (two) times daily as needed for mild constipation. Patient taking differently: Take 17 g by mouth daily as needed for mild constipation. 01/28/22   Maczis, Barth Kirks, PA-C  pravastatin (PRAVACHOL) 20 MG tablet Take 20 mg by mouth at bedtime.     [provider]  sertraline (ZOLOFT) 25 MG tablet Take 1 tablet (25 mg total) by mouth daily. 04/16/22   Barton Dubois, MD      Allergies    Cipro [ciprofloxacin hcl], Cleocin [clindamycin], Lincocin [lincomycin hcl], Codeine, Flexeril [cyclobenzaprine hcl], Hydrocodone, Macrobid [nitrofurantoin], Nalfon [fenoprofen calcium], Noroxin [norfloxacin], Relafen [nabumetone], Sulfa antibiotics, and Voltaren [diclofenac]    Review of  Systems   Review of Systems  Unable to perform ROS: Patient nonverbal    Physical Exam Updated Vital Signs BP 93/71 (BP Location: Right Arm)   Pulse (!) 130   Temp (!) 97.3 F (36.3 C) (Axillary)   Resp (!) 27   Ht '5\' 2"'$  (1.575 m)   Wt 53.6 kg   LMP 11/03/1983   SpO2 98%   BMI 21.61 kg/m  Physical Exam Vitals and nursing note reviewed.  Constitutional:      General: She is not in acute distress.    Appearance: She is well-developed.  HENT:     Head: Normocephalic and atraumatic.  Eyes:     Conjunctiva/sclera: Conjunctivae normal.  Cardiovascular:     Rate and Rhythm: Normal rate and regular rhythm.     Heart sounds: No murmur heard. Pulmonary:     Effort: Pulmonary effort is normal. Tachypnea present. No respiratory distress.     Breath sounds: Wheezing present.  Abdominal:     General: There is no distension.     Palpations: Abdomen is soft.     Tenderness: There is no abdominal tenderness. There is no right CVA tenderness or left CVA tenderness.  Musculoskeletal:        General: No swelling or tenderness. Normal range of motion.     Cervical back: Neck supple.  Skin:    General: Skin is warm and dry.  Neurological:     General: No focal deficit present.     Mental Status: She is alert and oriented to person, place, and time. Mental status is at baseline.     Cranial Nerves: No cranial nerve deficit.      ED Course/ Medical Decision Making/ A&P Clinical Course as of 06/02/22 2311  Tue Jun 02, 2022  2228 Received Albuterol '15mg'$ , Ipra and Solumedrol '125mg'$  and mag '2mg'$  in clinic [CC]    Clinical Course User Index [CC] Tretha Sciara, MD    Procedures Procedures   Medications Ordered in ED Medications - No data to display   Medical Decision Making:    Natalie West is a 88 y.o. female who presented to the ED today with shortness of breath and altered mental status detailed above.     Handoff received from EMS.  Patient's presentation is complicated  by their history of multiple comorbid medical problems including recent admission for COPD exacerbation.  Patient placed on continuous vitals and telemetry monitoring while in ED which was reviewed periodically.   Complete initial physical exam performed, notably the patient  was hemodynamically stable though noted to have ongoing acute respiratory distress.  Her PCO2 is currently in the 30s after extensive interventions per EMS.  Patient remains.     Reviewed and confirmed nursing documentation for past medical history, family history, social history.    Initial Assessment:   With the patient's presentation of SOB and AMS, most likely diagnosis is COPD exacerbation. Other diagnoses were considered including (but not limited to) ACS/PE/CAP. These are considered less likely due to history  of present illness and physical exam findings.   This is most consistent with an acute life/limb threatening illness complicated by underlying chronic conditions.  Initial Plan:  We will continue patient on BiPAP given there continued poor air movement on exam.  We will plan for a 1 hour VBG and reassessment We will evaluate for cardiac abnormality with EKG and troponin Screening labs including CBC and Metabolic panel to evaluate for infectious or metabolic etiology of disease.  CXR to evaluate for structural/infectious intrathoracic pathology.  Objective evaluation as below reviewed   Initial Study Results:   Laboratory  All laboratory results reviewed without evidence of clinically relevant pathology.    EKG EKG was reviewed independently. Rate, rhythm, axis, intervals all examined and without medically relevant abnormality. ST segments without concerns for elevations.     Radiology  All images reviewed independently. Agree with radiology report at this time.    DG Chest Portable 1 View  Final Result       Consults:  Case discussed with hospitalist.   Final Assessment and Plan:   On repeat  evaluation, patient's history of present illness and physical exam findings are most consistent with ongoing COPD exacerbation.  Patient mental status continues to improve on BiPAP.  However, patient still has poor air movement.  She has received antibiotics, steroids at this time.  I do not believe that further beta agonism would benefit the patient as it appears to have posterior and A-fib with RVR.  Without further medical therapy, she has stabilized her heart rate in the low 100s but given the mild troponin elevation, further beta agonism may provoke more myocardial injury.  Discussed the case with hospitalist who accepted patient in admission.  Patient admitted at this time.    Clinical Impression:  1. COPD exacerbation ([place])         Admit   Final Clinical Impression(s) / ED Diagnoses Final diagnoses:  COPD exacerbation [facility])    Rx / DC Orders ED Discharge Orders     None         Tretha Sciara, MD 06/02/22 2312

## 2022-06-02 NOTE — Progress Notes (Signed)
Pt. Placed on Bipap upon arrival with EMS. Settings 10 IPAP, 5 EPAP, Pt tolerating well, SP02 98%. RT will continue to monitor.

## 2022-06-02 NOTE — Assessment & Plan Note (Signed)
Nystatin ointment BID

## 2022-06-02 NOTE — H&P (Signed)
History and Physical    Patient: Natalie West DOB: [birth year] DOA: 06/02/2022 DOS: the patient was seen and examined on 06/02/2022 PCP: Natalie Sacramento, MD  Patient coming from: Home  Chief Complaint:  Chief Complaint  Patient presents with   Shortness of Breath   HPI: Natalie West is a 88 y.o. female with medical history significant of pulmonary embolism (04/12/2022), chronic atrial fibrillation on Eliquis, hypertension, chronic diastolic heart failure, CKD stage IIIa who presents with concerns of unresponsiveness requiring BiPAP.  Patient is a limited historian also difficult to understand while on BiPAP. Reportedly patient was found by EMS to be unresponsive with audible wheezing.  Patient resuscitated with bag-valve-mask given 1 neb of albuterol.  Reportedly she became more alert and was placed on CPAP.  On route she was given 15 albuterol, 1 Atrovent, 125 mg Solu-Medrol, 2 magnesium. She was just discharged about 2 weeks ago for acute hypoxic respiratory failure secondary to pneumonia.  She was treated with a course of ceftriaxone and azithromycin.  States that she felt very fatigued after discharge and thinks her increased shortness of breath with started again yesterday.  Has been having continuous productive cough. She also has history of recent PE in June but unclear if she has been taking her Eliquis.   In the ED, she presented with atrial fibrillation with RVR with heart rates up to 130, tachypneic requiring BiPAP VBG with normal pH is 7.39, PCO2 of 32. Had leukocytosis of 16.8 which is worse than 2 weeks ago when she was hospitalized.  Also appears hemoconcentrated. Troponin mildly elevated 24  COVID PCR negative.  Chest x-ray was negative. Review of Systems: As mentioned in the history of present illness. All other systems reviewed and are negative. Past Medical History:  Diagnosis Date   Acute diastolic heart failure ([place]) 05/07/2021   Acute pulmonary embolism ([place])  04/12/2022   Anxiety    Arthritis    Breast cancer ([place]) 2006   right breast   CAP (community acquired pneumonia) 05/03/2022   Chronic cough    Chronic UTI    Decubitus skin ulcer 05/04/2022   Depression    Dysrhythmia    Esophageal reflux    Esophageal stricture    Family history of malignant neoplasm of gastrointestinal tract    GERD (gastroesophageal reflux disease)    Hearing loss    Hemorrhoids    internal   History of hiatal hernia    Hypercholesterolemia    Hypertension    Hypokalemia 05/04/2022   Numbness and tingling    feeet, occasionally   Osteopenia    Personal history of colonic polyps 02/09/2007   TUBULAR ADENOMA   Personal history of radiation therapy    PONV (postoperative nausea and vomiting)    trouble opening mouth and jaw, jaw pops, trouble turning head   Rotator cuff tear    RT   Status post dilation of esophageal narrowing    Umbilical hernia    Past Surgical History:  Procedure Laterality Date   BREAST LUMPECTOMY  2006   right lumpectomy   CATARACT EXTRACTION W/ INTRAOCULAR LENS IMPLANT Bilateral    COLONOSCOPY     CYSTOCELE REPAIR N/A 04/20/2016   Procedure: AUGMENTED ANTERIOR VAULT REPAIR, COLOPLAST DERMIS GRAFT KELLY PLICATION SACROSPINOUS FIXATION;  Surgeon: Carolan Clines, MD;  Location: [facility];  Service: Urology;  Laterality: N/A;   DILATION AND CURETTAGE OF UTERUS     ESOPHAGOGASTRODUODENOSCOPY ENDOSCOPY     PUBOVAGINAL SLING N/A 04/20/2016  Procedure: PUBO-VAGINAL [name];  Surgeon: Carolan Clines, MD;  Location: [facility];  Service: Urology;  Laterality: N/A;   REVISION URINARY SLING N/A 06/12/2016   Procedure: INCISION OF URETHRAL SLING;  Surgeon: Carolan Clines, MD;  Location: [facility];  Service: Urology;  Laterality: N/A;  1 HOUR   SHOULDER OPEN ROTATOR CUFF REPAIR Right 02/28/2013   Procedure: RIGHT ROTATOR CUFF REPAIR SHOULDER OPEN WITH GRAFT AND ANCHORS;  Surgeon: Tobi Bastos, MD;  Location: [facility];  Service: Orthopedics;   Laterality: Right;   UMBILICAL HERNIA REPAIR     Social History:  reports that she has never smoked. She has never used smokeless tobacco. She reports that she does not drink alcohol and does not use drugs.  Allergies  Allergen Reactions   Cipro [Ciprofloxacin Hcl] Other (See Comments)    Unknown reaction   Cleocin [Clindamycin] Other (See Comments)    Unknown reaction   Lincocin [Lincomycin Hcl] Other (See Comments)    Unknown reaction   Codeine Nausea And Vomiting   Flexeril [Cyclobenzaprine Hcl] Other (See Comments)    Unknown reaction   Hydrocodone Other (See Comments)    "Last time it was too strong and messed up my mind"   Macrobid [Nitrofurantoin] Other (See Comments)    Unknown reaction   Nalfon [Fenoprofen Calcium] Other (See Comments)    Unknown reaction   Noroxin [Norfloxacin] Other (See Comments)    Unknown reaction    Relafen [Nabumetone] Other (See Comments)    Unknown reaction    Sulfa Antibiotics Other (See Comments)    Unknown reaction   Voltaren [Diclofenac] Other (See Comments)    Unknown reaction     Family History  Problem Relation Age of Onset   Colon cancer Mother 19   Kidney cancer Mother    Glaucoma Mother    Heart disease Sister    Stomach cancer Brother    Stroke Sister    Lung cancer Brother    Clotting disorder Son    Emphysema Father        smoker   Hypertension Sister     Prior to Admission medications   Medication Sig Start Date End Date Taking? Authorizing Provider  acetaminophen (TYLENOL) 325 MG tablet Take 2 tablets (650 mg total) by mouth every 6 (six) hours as needed. Patient taking differently: Take 650 mg by mouth every 6 (six) hours as needed for moderate pain or headache. 01/28/22   Maczis, Barth Kirks, PA-C  albuterol (PROVENTIL) (2.5 MG/3ML) 0.083% nebulizer solution Take 2.5 mg by nebulization every 4 (four) hours as needed for wheezing or shortness of breath. 04/30/22   [provider]  amitriptyline (ELAVIL) 25  MG tablet Take 1 tablet (25 mg total) by mouth in the morning and at bedtime. 04/16/22   Barton Dubois, MD  apixaban (ELIQUIS) 5 MG TABS tablet Take 1 tablet (5 mg total) by mouth 2 (two) times daily. 05/07/22   Shelly Coss, MD  azithromycin (ZITHROMAX) 250 MG tablet 1 tab po daily x 3 more days 05/19/22   Donne Hazel, MD  benzonatate (TESSALON) 100 MG capsule Take 1 capsule (100 mg total) by mouth every 8 (eight) hours. 05/19/22   Donne Hazel, MD  empagliflozin (JARDIANCE) 10 MG TABS tablet Take 1 tablet (10 mg total) by mouth daily. 05/08/22   Shelly Coss, MD  furosemide (LASIX) 20 MG tablet Take 1 tablet (20 mg total) by mouth daily as needed for fluid or edema. 04/16/22   Barton Dubois,  MD  leptospermum manuka honey (MEDIHONEY) PSTE paste Apply 1 Application topically daily. 05/20/22   Donne Hazel, MD  megestrol (MEGACE) 400 MG/10ML suspension Take 10 mLs (400 mg total) by mouth 2 (two) times daily. 04/16/22 06/15/22  Barton Dubois, MD  metoprolol tartrate (LOPRESSOR) 25 MG tablet Take 1 tablet (25 mg total) by mouth 2 (two) times daily. 05/07/22   Shelly Coss, MD  mirtazapine (REMERON) 15 MG tablet Take 1 tablet (15 mg total) by mouth at bedtime. 04/16/22   Barton Dubois, MD  Multiple Vitamin (MULTIVITAMIN WITH MINERALS) TABS tablet Take 1 tablet by mouth daily. Centrum Silver for Women    [provider]  pantoprazole (PROTONIX) 40 MG tablet Take 1 tablet (40 mg total) by mouth daily. 1 Tablet Daily for Heartburn and Acid Reflux Patient taking differently: Take 40 mg by mouth every evening. 04/16/22   Barton Dubois, MD  polyethylene glycol (MIRALAX / GLYCOLAX) 17 g packet Take 17 g by mouth 2 (two) times daily as needed for mild constipation. Patient taking differently: Take 17 g by mouth daily as needed for mild constipation. 01/28/22   Maczis, Barth Kirks, PA-C  pravastatin (PRAVACHOL) 20 MG tablet Take 20 mg by mouth at bedtime.     [provider]  sertraline  (ZOLOFT) 25 MG tablet Take 1 tablet (25 mg total) by mouth daily. 04/16/22   Barton Dubois, MD    Physical Exam: Vitals:   06/02/22 2045 06/02/22 2046  BP: 93/71   Pulse: (!) 130 (!) 130  Resp:  (!) 27  Temp: (!) 97.3 F (36.3 C)   TempSrc: Axillary   SpO2: 97% 98%  Weight:  53.6 kg  Height:  '5\' 2"'$  (1.575 m)   Constitutional: NAD, calm, comfortable, chronically ill-appearing elderly female laying in approximately 30 degree incline in bed on BiPAP Eyes: lids and conjunctivae normal ENMT: Mucous membranes are moist. Neck: normal, supple Respiratory: Difficult to auscultate while on BiPAP but no clear wheezing or crackles. Cardiovascular: Regular rate and rhythm, no murmurs / rubs / gallops. No extremity edema. 2+ pedal pulses. No carotid bruits.  Abdomen: Soft, nontender nondistended.  Bowel sounds positive.  Musculoskeletal: no clubbing / cyanosis. No joint deformity upper and lower extremities. Good ROM, no contractures.  Skin: Erythematous moist rash beneath bilateral breast fold. Superficial skin tear wound to the left buttocks.  Unable to clearly visualize sacral region due to body habitus and being on BiPAP. Neurologic: CN 2-12 grossly intact.  Strength of 4-5 lower extremity.  Psychiatric: Normal judgment and insight. Alert and oriented x 3. Normal mood. Data Reviewed:  See HPI  Assessment and Plan: * Acute respiratory failure with hypoxia (HCC) Pt initially treated for COPD with IV solumedrol, duonebs, magnesium but with worsening leukocytosis suspect she may have recurrent pneumonia although CXR unrevealing for now. -start on Vancomycin and cefepime -she also has hx of recent PE in June and unclear whether she is taking Eliquis- obtain stat CTA chest  -continue on Bipap and wean as tolerated. Maintain O2>92% -Keep NPO.  She was previously evaluated by speech therapy and will need dysphasic 3 diet when she is able to safely transition off BiPAP  Essential hypertension BP  is on the soft side with SBP 98. Hold on antihypertensive for now.  -Give 500cc LR bolus and keep on continuous IV fluid  History of pulmonary embolism Continue Eliquis. Awaiting CTA chest as above.  Candidiasis of breast Nystatin ointment BID  Chronic atrial fibrillation with RVR (HCC) HR  of 130 on arrival and this is improving without intervention to about 110. Continue to monitor on telemetry.      Advance Care Planning:   Code Status: Full Code -need to readdress once patient is off BiPAP  Consults: None  Family Communication: None at bedside  Severity of Illness: The appropriate patient status for this patient is INPATIENT. Inpatient status is judged to be reasonable and necessary in order to provide the required intensity of service to ensure the patient's safety. The patient's presenting symptoms, physical exam findings, and initial radiographic and laboratory data in the context of their chronic comorbidities is felt to place them at high risk for further clinical deterioration. Furthermore, it is not anticipated that the patient will be medically stable for discharge from the hospital within 2 midnights of admission.   * I certify that at the point of admission it is my clinical judgment that the patient will require inpatient hospital care spanning beyond 2 midnights from the point of admission due to high intensity of service, high risk for further deterioration and high frequency of surveillance required.*  Author: Orene Desanctis, DO 06/02/2022 11:33 PM  For on call review [email or web address].

## 2022-06-02 NOTE — Assessment & Plan Note (Signed)
Continue Eliquis. Awaiting CTA chest as above.

## 2022-06-02 NOTE — Progress Notes (Signed)
Pharmacy Antibiotic Note  Natalie West is a 88 y.o. female admitted on 06/02/2022 presenting with SOB on Bipap with EMS, concern for pna.  Pharmacy has been consulted for vancomycin and cefepime dosing.  Plan: Vancomycin 1000 mg IV x 1, then 500 mg IV q 24h (eAUC 429, SCr 0.99) Cefepime 2g IV every 24 hours Add MRSA PCR Monitor renal function, Cx/PCR to narrow Vancomycin levels as needed  Height: '5\' 2"'$  (157.5 cm) Weight: 53.6 kg (118 lb 2.7 oz) IBW/kg (Calculated) : 50.1  Temp (24hrs), Avg:97.3 F (36.3 C), Min:97.3 F (36.3 C), Max:97.3 F (36.3 C)  Recent Labs  Lab 06/02/22 2048  WBC 16.8*  CREATININE 0.99    Estimated Creatinine Clearance: 31.1 mL/min (by C-G formula based on SCr of 0.99 mg/dL).    Allergies  Allergen Reactions   Cipro [Ciprofloxacin Hcl] Other (See Comments)    Unknown reaction   Cleocin [Clindamycin] Other (See Comments)    Unknown reaction   Lincocin [Lincomycin Hcl] Other (See Comments)    Unknown reaction   Codeine Nausea And Vomiting   Flexeril [Cyclobenzaprine Hcl] Other (See Comments)    Unknown reaction   Hydrocodone Other (See Comments)    "Last time it was too strong and messed up my mind"   Macrobid [Nitrofurantoin] Other (See Comments)    Unknown reaction   Nalfon [Fenoprofen Calcium] Other (See Comments)    Unknown reaction   Noroxin [Norfloxacin] Other (See Comments)    Unknown reaction    Relafen [Nabumetone] Other (See Comments)    Unknown reaction    Sulfa Antibiotics Other (See Comments)    Unknown reaction   Voltaren [Diclofenac] Other (See Comments)    Unknown reaction     Bertis Ruddy, PharmD Clinical Pharmacist ED Pharmacist Phone # [phone] 06/02/2022 11:12 PM

## 2022-06-02 NOTE — Assessment & Plan Note (Addendum)
Pt initially treated for COPD with IV solumedrol, duonebs, magnesium but with worsening leukocytosis suspect she may have recurrent pneumonia although CXR unrevealing for now. -start on Vancomycin and cefepime -she also has hx of recent PE in June and unclear whether she is taking Eliquis- obtain stat CTA chest  -continue on Bipap and wean as tolerated. Maintain O2>92% -Keep NPO.  She was previously evaluated by speech therapy and will need dysphasic 3 diet when she is able to safely transition off BiPAP

## 2022-06-03 ENCOUNTER — Inpatient Hospital Stay (HOSPITAL_COMMUNITY): Payer: Medicare HMO

## 2022-06-03 DIAGNOSIS — B3789 Other sites of candidiasis: Secondary | ICD-10-CM | POA: Diagnosis not present

## 2022-06-03 DIAGNOSIS — J441 Chronic obstructive pulmonary disease with (acute) exacerbation: Secondary | ICD-10-CM

## 2022-06-03 DIAGNOSIS — Z789 Other specified health status: Secondary | ICD-10-CM

## 2022-06-03 DIAGNOSIS — Z711 Person with feared health complaint in whom no diagnosis is made: Secondary | ICD-10-CM

## 2022-06-03 DIAGNOSIS — Z515 Encounter for palliative care: Secondary | ICD-10-CM

## 2022-06-03 DIAGNOSIS — J9601 Acute respiratory failure with hypoxia: Secondary | ICD-10-CM | POA: Diagnosis not present

## 2022-06-03 DIAGNOSIS — Z7189 Other specified counseling: Secondary | ICD-10-CM

## 2022-06-03 DIAGNOSIS — I482 Chronic atrial fibrillation, unspecified: Secondary | ICD-10-CM | POA: Diagnosis not present

## 2022-06-03 DIAGNOSIS — Z66 Do not resuscitate: Secondary | ICD-10-CM

## 2022-06-03 LAB — BASIC METABOLIC PANEL
Anion gap: 5 (ref 5–15)
BUN: 20 mg/dL (ref 8–23)
CO2: 22 mmol/L (ref 22–32)
Calcium: 8.3 mg/dL — ABNORMAL LOW (ref 8.9–10.3)
Chloride: 112 mmol/L — ABNORMAL HIGH (ref 98–111)
Creatinine, Ser: 0.87 mg/dL (ref 0.44–1.00)
GFR, Estimated: >60 mL/min (ref >60)
Glucose, Bld: 163 mg/dL — ABNORMAL HIGH (ref 70–99)
Potassium: 4.3 mmol/L (ref 3.5–5.1)
Sodium: 139 mmol/L (ref 135–145)

## 2022-06-03 LAB — CBC
HCT: 37.4 % (ref 36.0–46.0)
Hemoglobin: 11.7 g/dL — ABNORMAL LOW (ref 12.0–15.0)
MCH: 33.7 pg (ref 26.0–34.0)
MCHC: 31.3 g/dL (ref 30.0–36.0)
MCV: 107.8 fL — ABNORMAL HIGH (ref 80.0–100.0)
Platelets: 177 10*3/uL (ref 150–400)
RBC: 3.47 MIL/uL — ABNORMAL LOW (ref 3.87–5.11)
RDW: 16.7 % — ABNORMAL HIGH (ref 11.5–15.5)
WBC: 14 10*3/uL — ABNORMAL HIGH (ref 4.0–10.5)
nRBC: 0 % (ref 0.0–0.2)

## 2022-06-03 LAB — URINALYSIS, ROUTINE W REFLEX MICROSCOPIC
Bacteria, UA: NONE SEEN
Bilirubin Urine: NEGATIVE
Glucose, UA: >=500 mg/dL — AB
Hgb urine dipstick: NEGATIVE
Ketones, ur: NEGATIVE mg/dL
Nitrite: NEGATIVE
Protein, ur: NEGATIVE mg/dL
Specific Gravity, Urine: 1.037 — ABNORMAL HIGH (ref 1.005–1.030)
pH: 5 (ref 5.0–8.0)

## 2022-06-03 LAB — TROPONIN I (HIGH SENSITIVITY): Troponin I (High Sensitivity): 33 ng/L — ABNORMAL HIGH (ref <18)

## 2022-06-03 LAB — MRSA NEXT GEN BY PCR, NASAL: MRSA by PCR Next Gen: NOT DETECTED

## 2022-06-03 MED ORDER — APIXABAN 5 MG PO TABS
5.0000 mg | ORAL_TABLET | Freq: Two times a day (BID) | ORAL | Status: DC
  Administered 2022-06-03 – 2022-06-13 (×21): 5 mg via ORAL
  Filled 2022-06-03 (×21): qty 1

## 2022-06-03 MED ORDER — SODIUM CHLORIDE 0.9 % IV SOLN
2.0000 g | Freq: Two times a day (BID) | INTRAVENOUS | Status: DC
  Administered 2022-06-03 – 2022-06-04 (×2): 2 g via INTRAVENOUS
  Filled 2022-06-03 (×2): qty 12.5

## 2022-06-03 MED ORDER — PANTOPRAZOLE SODIUM 40 MG PO TBEC
40.0000 mg | DELAYED_RELEASE_TABLET | Freq: Every day | ORAL | Status: DC
  Administered 2022-06-03 – 2022-06-09 (×7): 40 mg via ORAL
  Filled 2022-06-03 (×7): qty 1

## 2022-06-03 MED ORDER — AMITRIPTYLINE HCL 25 MG PO TABS
25.0000 mg | ORAL_TABLET | Freq: Every day | ORAL | Status: DC
  Administered 2022-06-03 – 2022-06-12 (×10): 25 mg via ORAL
  Filled 2022-06-03 (×10): qty 1

## 2022-06-03 MED ORDER — FUROSEMIDE 10 MG/ML IJ SOLN
40.0000 mg | Freq: Once | INTRAMUSCULAR | Status: AC
  Administered 2022-06-03: 40 mg via INTRAVENOUS
  Filled 2022-06-03: qty 4

## 2022-06-03 MED ORDER — IPRATROPIUM-ALBUTEROL 0.5-2.5 (3) MG/3ML IN SOLN
3.0000 mL | Freq: Four times a day (QID) | RESPIRATORY_TRACT | Status: DC
  Administered 2022-06-03: 3 mL via RESPIRATORY_TRACT
  Filled 2022-06-03: qty 3

## 2022-06-03 MED ORDER — IOHEXOL 350 MG/ML SOLN
52.0000 mL | Freq: Once | INTRAVENOUS | Status: AC | PRN
  Administered 2022-06-03: 52 mL via INTRAVENOUS

## 2022-06-03 MED ORDER — PRAVASTATIN SODIUM 10 MG PO TABS
20.0000 mg | ORAL_TABLET | Freq: Every day | ORAL | Status: DC
  Administered 2022-06-03 – 2022-06-12 (×10): 20 mg via ORAL
  Filled 2022-06-03 (×10): qty 2

## 2022-06-03 MED ORDER — METHYLPREDNISOLONE SODIUM SUCC 40 MG IJ SOLR
40.0000 mg | Freq: Two times a day (BID) | INTRAMUSCULAR | Status: DC
  Administered 2022-06-03 (×2): 40 mg via INTRAVENOUS
  Filled 2022-06-03 (×2): qty 1

## 2022-06-03 MED ORDER — SERTRALINE HCL 25 MG PO TABS
25.0000 mg | ORAL_TABLET | Freq: Every day | ORAL | Status: DC
  Administered 2022-06-03 – 2022-06-13 (×11): 25 mg via ORAL
  Filled 2022-06-03 (×11): qty 1

## 2022-06-03 MED ORDER — MIRTAZAPINE 15 MG PO TABS
15.0000 mg | ORAL_TABLET | Freq: Every day | ORAL | Status: DC
  Administered 2022-06-03 – 2022-06-12 (×10): 15 mg via ORAL
  Filled 2022-06-03 (×10): qty 1

## 2022-06-03 MED ORDER — METOPROLOL TARTRATE 25 MG PO TABS
25.0000 mg | ORAL_TABLET | Freq: Two times a day (BID) | ORAL | Status: DC
  Administered 2022-06-03 – 2022-06-13 (×20): 25 mg via ORAL
  Filled 2022-06-03 (×22): qty 1

## 2022-06-03 MED ORDER — IPRATROPIUM-ALBUTEROL 0.5-2.5 (3) MG/3ML IN SOLN: 3.0000 mL | RESPIRATORY_TRACT | Status: DC | PRN

## 2022-06-03 MED ORDER — EMPAGLIFLOZIN 10 MG PO TABS
10.0000 mg | ORAL_TABLET | Freq: Every day | ORAL | Status: DC
  Administered 2022-06-03 – 2022-06-13 (×11): 10 mg via ORAL
  Filled 2022-06-03 (×11): qty 1

## 2022-06-03 NOTE — Evaluation (Signed)
Clinical/Bedside Swallow Evaluation Patient Details  Name: Natalie West MRN: [id number] Date of Birth: 10-12-1931  Today's Date: 06/03/2022 Time: SLP Start Time (ACUTE ONLY): 3875 SLP Stop Time (ACUTE ONLY): 6433 SLP Time Calculation (min) (ACUTE ONLY): 10 min  Past Medical History:  Past Medical History:  Diagnosis Date   Acute diastolic heart failure ([place]) 05/07/2021   Acute pulmonary embolism ([place]) 04/12/2022   Anxiety    Arthritis    Breast cancer ([place]) 2006   right breast   CAP (community acquired pneumonia) 05/03/2022   Chronic cough    Chronic UTI    Decubitus skin ulcer 05/04/2022   Depression    Dysrhythmia    Esophageal reflux    Esophageal stricture    Family history of malignant neoplasm of gastrointestinal tract    GERD (gastroesophageal reflux disease)    Hearing loss    Hemorrhoids    internal   History of hiatal hernia    Hypercholesterolemia    Hypertension    Hypokalemia 05/04/2022   Numbness and tingling    feeet, occasionally   Osteopenia    Personal history of colonic polyps 02/09/2007   TUBULAR ADENOMA   Personal history of radiation therapy    PONV (postoperative nausea and vomiting)    trouble opening mouth and jaw, jaw pops, trouble turning head   Rotator cuff tear    RT   Status post dilation of esophageal narrowing    Umbilical hernia    Past Surgical History:  Past Surgical History:  Procedure Laterality Date   BREAST LUMPECTOMY  2006   right lumpectomy   CATARACT EXTRACTION W/ INTRAOCULAR LENS IMPLANT Bilateral    COLONOSCOPY     CYSTOCELE REPAIR N/A 04/20/2016   Procedure: AUGMENTED ANTERIOR VAULT REPAIR, COLOPLAST DERMIS GRAFT KELLY PLICATION SACROSPINOUS FIXATION;  Surgeon: Carolan Clines, MD;  Location: [facility];  Service: Urology;  Laterality: N/A;   DILATION AND CURETTAGE OF UTERUS     ESOPHAGOGASTRODUODENOSCOPY ENDOSCOPY     PUBOVAGINAL SLING N/A 04/20/2016   Procedure: PUBO-VAGINAL [name];  Surgeon: Carolan Clines, MD;   Location: [facility];  Service: Urology;  Laterality: N/A;   REVISION URINARY SLING N/A 06/12/2016   Procedure: INCISION OF URETHRAL SLING;  Surgeon: Carolan Clines, MD;  Location: [facility];  Service: Urology;  Laterality: N/A;  1 HOUR   SHOULDER OPEN ROTATOR CUFF REPAIR Right 02/28/2013   Procedure: RIGHT ROTATOR CUFF REPAIR SHOULDER OPEN WITH GRAFT AND ANCHORS;  Surgeon: Tobi Bastos, MD;  Location: [facility];  Service: Orthopedics;  Laterality: Right;   UMBILICAL HERNIA REPAIR     HPI:  89/F with history of chronic diastolic CHF, history of PE, chronic A-fib on Eliquis, hypertension, CKD 3, suspected COPD, cognitive deficits, frailty-wheelchair/bedbound, multiple recent hospitalizations was brought to the ED with decreased responsiveness and dyspnea  -Recently hospitalized with healthcare associated pneumonia, this is her fourth hospitalization in 2 months, currently lives at home with caregivers. Pt seen on 7/3 by SLP who observed coughign with thin liquids. MBS completed, pt had normal swallowing function except for prlonned mastication. There was no coughing on MBS and no coughing in f/u visit. Pt had EGD in 2018 that showed a stricture. Esophageal sweep on 7/3 appeared WNL though no radiologist present to confirm.    Assessment / Plan / Recommendation  Clinical Impression  Assessment very brief due to pt transfer out of ED room and new pt waiting. Pt is alert and following commands. She is able to take sips  of water, but does have an immediate cough with thin liquids. In prior assessment she tolerated nectar thick liquids well. Actually in July, she had an MBS that showed normal swallowing, though she was coughing on thin liquids in the room. Esophageal sweep looked adequate. SLP at that time suspected that pts function varied based on positioning and arousal. Given recurrent pna, more conservative diet is warranted for now. Tried to call pts nephew, but he did not respond. Await plan of care  decisions from Palliative team and family. SLP Visit Diagnosis: Dysphagia, unspecified (R13.10)    Aspiration Risk  Moderate aspiration risk    Diet Recommendation Dysphagia 3 (Mech soft);Nectar-thick liquid   Liquid Administration via: Cup;Straw Medication Administration: Whole meds with puree Supervision: Staff to assist with self feeding Compensations: Slow rate;Small sips/bites Postural Changes: Seated upright at 90 degrees    Other  Recommendations Oral Care Recommendations: Oral care BID    Recommendations for follow up therapy are one component of a multi-disciplinary discharge planning process, led by the attending physician.  Recommendations may be updated based on patient status, additional functional criteria and insurance authorization.  Follow up Recommendations Skilled nursing-short term rehab (<3 hours/day)      Assistance Recommended at Discharge    Functional Status Assessment Patient has had a recent decline in their functional status and demonstrates the ability to make significant improvements in function in a reasonable and predictable amount of time.  Frequency and Duration min 2x/week  2 weeks       Prognosis Prognosis for Safe Diet Advancement: Fair      Swallow Study   General HPI: 89/F with history of chronic diastolic CHF, history of PE, chronic A-fib on Eliquis, hypertension, CKD 3, suspected COPD, cognitive deficits, frailty-wheelchair/bedbound, multiple recent hospitalizations was brought to the ED with decreased responsiveness and dyspnea  -Recently hospitalized with healthcare associated pneumonia, this is her fourth hospitalization in 2 months, currently lives at home with caregivers. Pt seen on 7/3 by SLP who observed coughign with thin liquids. MBS completed, pt had normal swallowing function except for prlonned mastication. There was no coughing on MBS and no coughing in f/u visit. Pt had EGD in 2018 that showed a stricture. Esophageal sweep on  7/3 appeared WNL though no radiologist present to confirm. Type of Study: Bedside Swallow Evaluation Diet Prior to this Study: NPO History of Recent Intubation: No Behavior/Cognition: Alert;Cooperative Oral Cavity Assessment: Dry Oral Care Completed by SLP: No Oral Cavity - Dentition: Adequate natural dentition Vision: Functional for self-feeding Self-Feeding Abilities: Total assist Patient Positioning: Upright in bed Baseline Vocal Quality: Normal Volitional Cough: Strong;Congested Volitional Swallow: Unable to elicit    Oral/Motor/Sensory Function Overall Oral Motor/Sensory Function: Within functional limits   Ice Chips Ice chips: Not tested   Thin Liquid Thin Liquid: Impaired Presentation: Straw Pharyngeal  Phase Impairments: Cough - Immediate    Nectar Thick Nectar Thick Liquid: Not tested   Honey Thick Honey Thick Liquid: Not tested   Puree Puree: Not tested   Solid     Solid: Not tested      Natalie West, Natalie West 06/03/2022,2:12 PM

## 2022-06-03 NOTE — Progress Notes (Signed)
Pharmacy Antibiotic Note  Natalie West is a 88 y.o. female admitted on 06/02/2022 presenting with SOB on Bipap with EMS, concern for pna.  Pharmacy has been consulted for cefepime dosing - day #2. SCr down a bit to 0.87. MRSA PCR negative.  Plan: Adjust cefepime to 2g IV q12h Vancomycin d/c'd per discussion with Dr. Broadus John Monitor clinical progress, c/s, renal function F/u de-escalation plan/LOT  Height: '5\' 2"'$  (157.5 cm) Weight: 53.6 kg (118 lb 2.7 oz) IBW/kg (Calculated) : 50.1  Temp (24hrs), Avg:97.7 F (36.5 C), Min:97.3 F (36.3 C), Max:97.9 F (36.6 C)  Recent Labs  Lab 06/02/22 2048 06/03/22 0342  WBC 16.8* 14.0*  CREATININE 0.99 0.87     Estimated Creatinine Clearance: 35.4 mL/min (by C-G formula based on SCr of 0.87 mg/dL).    Allergies  Allergen Reactions   Cipro [Ciprofloxacin Hcl] Other (See Comments)    Unknown reaction   Cleocin [Clindamycin] Other (See Comments)    Unknown reaction   Lincocin [Lincomycin Hcl] Other (See Comments)    Unknown reaction   Codeine Nausea And Vomiting   Flexeril [Cyclobenzaprine Hcl] Other (See Comments)    Unknown reaction   Hydrocodone Other (See Comments)    "Last time it was too strong and messed up my mind"   Macrobid [Nitrofurantoin] Other (See Comments)    Unknown reaction   Nalfon [Fenoprofen Calcium] Other (See Comments)    Unknown reaction   Noroxin [Norfloxacin] Other (See Comments)    Unknown reaction    Relafen [Nabumetone] Other (See Comments)    Unknown reaction    Sulfa Antibiotics Other (See Comments)    Unknown reaction   Voltaren [Diclofenac] Other (See Comments)    Unknown reaction     Arturo Morton, PharmD, BCPS Please check AMION for all [facility] contact numbers Clinical Pharmacist 06/03/2022 9:34 AM

## 2022-06-03 NOTE — Progress Notes (Signed)
PROGRESS NOTE    Natalie West  SEG:[id number] DOB: 1931-04-22 DOA: 06/02/2022 PCP: Christain Sacramento, MD  [name] with history of chronic diastolic CHF, history of PE, chronic A-fib on Eliquis, hypertension, CKD 3, suspected COPD, cognitive deficits, frailty-wheelchair/bedbound, multiple recent hospitalizations was brought to the ED with decreased responsiveness and dyspnea -Recently hospitalized with healthcare associated pneumonia, this is her fourth hospitalization in 2 months, currently lives at home with caregivers  Subjective: -Very poor historian, unable to provide any meaningful information  Assessment and Plan:  Acute respiratory failure with hypoxia ([place]) -Required BiPAP on admission, now off -Clinically suspect diastolic CHF, and COPD exacerbation as contributing -CTA chest on admission no PE, bibasilar atelectasis and small pleural effusions -Continue IV Lasix today, restart Jardiance, Toprol discontinue antibiotics -Start IV Solu-Medrol, DuoNebs -Resume dysphagia 3 diet, will request SLP reeval  ETHICS: [name] with frequent hospitalizations, fourth admission in 2 months, CHF, COPD, recurrent pneumonia, cognitive deficits, failure to thrive, bedbound -Discussed poor prognosis with nephew Belenda Cruise, agree with palliative care consultation  Acute on chronic diastolic CHF -As above  COPD exacerbation -Non-smoker, exposed to secondhand smoking for 40+ years -IV steroids, DuoNebs as noted above  Essential hypertension -Stable, monitor with diuretics  History of DVT/pulmonary embolism Continue Eliquis.  Candidiasis of breast Nystatin ointment BID  Chronic atrial fibrillation with RVR (H[facility]) -Heart rate improved, restart Toprol, continue Eliquis  Severe protein calorie malnutrition  Frailty, wheelchair/bedbound  DVT prophylaxis: Eliquis Code Status: DNR Family Communication: No family at bedside, called and updated nephew/POA Belenda Cruise Disposition Plan: To be  determined  Consultants:    Procedures:   Antimicrobials:    Objective: Vitals:   06/03/22 0830 06/03/22 0900 06/03/22 0930 06/03/22 1000  BP: 113/77 122/81 110/77 119/70  Pulse: 93 89 89 91  Resp: 16 (!) '29 16 17  '$ Temp:      TempSrc:      SpO2: 97% 98% 96% 97%  Weight:      Height:        Intake/Output Summary (Last 24 hours) at 06/03/2022 1103 Last data filed at 06/03/2022 0313 Gross per 24 hour  Intake 96.69 ml  Output --  Net 96.69 ml   Filed Weights   06/02/22 2046  Weight: 53.6 kg    Examination:  General exam: Chronically ill elderly female laying in bed, somnolent, arousable, oriented to self and partly to place, cognitive deficits HEENT: No JVD CVS: S1-S2, regular rhythm Lungs: Poor air movement bilaterally, decreased breath sounds to bases Abdomen: Soft, nontender, bowel sounds present Extremities: No edema Psych: Poor insight and judgment   Data Reviewed:   CBC: Recent Labs  Lab 06/02/22 2048 06/02/22 2214 06/03/22 0342  WBC 16.8*  --  14.0*  NEUTROABS 12.7*  --   --   HGB 12.5 12.6 11.7*  HCT 39.7 37.0 37.4  MCV 108.8*  --  107.8*  PLT 231  --  737   Basic Metabolic Panel: Recent Labs  Lab 06/02/22 2048 06/02/22 2214 06/03/22 0342  NA 140 142 139  K 4.7 4.0 4.3  CL 114*  --  112*  CO2 22  --  22  GLUCOSE 225*  --  163*  BUN 22  --  20  CREATININE 0.99  --  0.87  CALCIUM 8.3*  --  8.3*   GFR: Estimated Creatinine Clearance: 35.4 mL/min (by C-G formula based on SCr of 0.87 mg/dL). Liver Function Tests: Recent Labs  Lab 06/02/22 2048  AST 23  ALT 13  ALKPHOS  51  BILITOT 0.5  PROT 6.0*  ALBUMIN 3.2*   Recent Labs  Lab 06/02/22 2048  LIPASE 29   No results for input(s): "AMMONIA" in the last 168 hours. Coagulation Profile: No results for input(s): "INR", "PROTIME" in the last 168 hours. Cardiac Enzymes: No results for input(s): "CKTOTAL", "CKMB", "CKMBINDEX", "TROPONINI" in the last 168 hours. BNP (last 3  results) No results for input(s): "PROBNP" in the last 8760 hours. HbA1C: No results for input(s): "HGBA1C" in the last 72 hours. CBG: No results for input(s): "GLUCAP" in the last 168 hours. Lipid Profile: No results for input(s): "CHOL", "HDL", "LDLCALC", "TRIG", "CHOLHDL", "LDLDIRECT" in the last 72 hours. Thyroid Function Tests: No results for input(s): "TSH", "T4TOTAL", "FREET4", "T3FREE", "THYROIDAB" in the last 72 hours. Anemia Panel: No results for input(s): "VITAMINB12", "FOLATE", "FERRITIN", "TIBC", "IRON", "RETICCTPCT" in the last 72 hours. Urine analysis:    Component Value Date/Time   COLORURINE YELLOW 06/03/2022 0600   APPEARANCEUR HAZY (A) 06/03/2022 0600   LABSPEC 1.037 (H) 06/03/2022 0600   PHURINE 5.0 06/03/2022 0600   GLUCOSEU >=500 (A) 06/03/2022 0600   HGBUR NEGATIVE 06/03/2022 0600   BILIRUBINUR NEGATIVE 06/03/2022 0600   BILIRUBINUR n 02/18/2015 1404   KETONESUR NEGATIVE 06/03/2022 0600   PROTEINUR NEGATIVE 06/03/2022 0600   UROBILINOGEN negative 02/18/2015 1404   UROBILINOGEN 0.2 02/28/2013 0739   NITRITE NEGATIVE 06/03/2022 0600   LEUKOCYTESUR TRACE (A) 06/03/2022 0600   Sepsis Labs: '@LABRCNTIP'$ (procalcitonin:4,lacticidven:4)  ) Recent Results (from the past 240 hour(s))  SARS Coronavirus 2 by RT PCR (hospital order, performed in [facility] hospital lab) *cepheid single result test* Anterior Nasal Swab     Status: None   Collection Time: 06/02/22  8:48 PM   Specimen: Anterior Nasal Swab  Result Value Ref Range Status   SARS Coronavirus 2 by RT PCR NEGATIVE NEGATIVE Final    Comment: (NOTE) SARS-CoV-2 target nucleic acids are NOT DETECTED.  The SARS-CoV-2 RNA is generally detectable in upper and lower respiratory specimens during the acute phase of infection. The lowest concentration of SARS-CoV-2 viral copies this assay can detect is 250 copies / mL. A negative result does not preclude SARS-CoV-2 infection and should not be used as the sole  basis for treatment or other patient management decisions.  A negative result may occur with improper specimen collection / handling, submission of specimen other than nasopharyngeal swab, presence of viral mutation(s) within the areas targeted by this assay, and inadequate number of viral copies (<250 copies / mL). A negative result must be combined with clinical observations, patient history, and epidemiological information.  Fact Sheet for Patients:   https://www.patel.info/  Fact Sheet for Healthcare Providers: https://hall.com/  This test is not yet approved or  cleared by the [place] FDA and has been authorized for detection and/or diagnosis of SARS-CoV-2 by FDA under an Emergency Use Authorization (EUA).  This EUA will remain in effect (meaning this test can be used) for the duration of the COVID-19 declaration under Section 564(b)(1) of the Act, 21 U.S.C. section 360bbb-3(b)(1), unless the authorization is terminated or revoked sooner.  Performed at [facility], [place] [address] [place], [place] [address]   Blood culture (routine x 2)     Status: None (Preliminary result)   Collection Time: 06/02/22 11:22 PM   Specimen: BLOOD RIGHT FOREARM  Result Value Ref Range Status   Specimen Description BLOOD RIGHT FOREARM  Final   Special Requests   Final    BOTTLES DRAWN AEROBIC  AND ANAEROBIC Blood Culture results may not be optimal due to an inadequate volume of blood received in culture bottles   Culture   Final    NO GROWTH < 12 HOURS Performed at [facility] [address], [place], [place] [address]    Report Status PENDING  Incomplete  Blood culture (routine x 2)     Status: None (Preliminary result)   Collection Time: 06/03/22 12:07 AM   Specimen: BLOOD  Result Value Ref Range Status   Specimen Description BLOOD SITE NOT SPECIFIED  Final   Special Requests   Final    BOTTLES DRAWN AEROBIC AND ANAEROBIC  Blood Culture adequate volume   Culture   Final    NO GROWTH < 12 HOURS Performed at [facility], [place] [address] [place], [place] [address]    Report Status PENDING  Incomplete  MRSA Next Gen by PCR, Nasal     Status: None   Collection Time: 06/03/22 12:07 AM   Specimen: Nasal Mucosa; Nasal Swab  Result Value Ref Range Status   MRSA by PCR Next Gen NOT DETECTED NOT DETECTED Final    Comment: (NOTE) The GeneXpert MRSA Assay (FDA approved for NASAL specimens only), is one component of a comprehensive MRSA colonization surveillance program. It is not intended to diagnose MRSA infection nor to guide or monitor treatment for MRSA infections. Test performance is not FDA approved in patients less than 47 years old. Performed at [facility], [place] [address], [place], [place] [address]      Radiology Studies: CT Angio Chest Pulmonary Embolism (PE) W or WO Contrast  Result Date: 06/03/2022 CLINICAL DATA:  Shortness of breath EXAM: CT ANGIOGRAPHY CHEST WITH CONTRAST TECHNIQUE: Multidetector CT imaging of the chest was performed using the standard protocol during bolus administration of intravenous contrast. Multiplanar CT image reconstructions and MIPs were obtained to evaluate the vascular anatomy. RADIATION DOSE REDUCTION: This exam was performed according to the departmental dose-optimization program which includes automated exposure control, adjustment of the mA and/or kV according to patient size and/or use of iterative reconstruction technique. CONTRAST:  25m OMNIPAQUE IOHEXOL 350 MG/ML SOLN COMPARISON:  05/16/2022 FINDINGS: Cardiovascular: Atherosclerotic calcifications of the thoracic aorta are noted. No aneurysmal dilatation is seen. No cardiac enlargement is noted. Pulmonary artery shows a normal branching pattern without intraluminal filling defect to suggest pulmonary embolism. Coronary calcifications are noted. Mediastinum/Nodes: Esophagus is within normal limits. Thoracic  inlet is within normal limits. No hilar or mediastinal adenopathy is noted. Lungs/Pleura: Mosaic attenuation is noted consistent with air trapping. Emphysematous changes are seen. Basilar atelectatic changes and small effusions are noted. No focal confluent infiltrate is seen. No sizable parenchymal nodule is noted. Upper Abdomen: Visualized upper abdomen shows right renal cyst. No follow-up is recommended. No other focal abnormality is seen. Musculoskeletal: Degenerative changes of the thoracic spine are seen. Review of the MIP images confirms the above findings. IMPRESSION: No evidence of pulmonary emboli. Bibasilar atelectasis and small effusions. No other focal abnormality is noted. Electronically Signed   By: MInez CatalinaM.D.   On: 06/03/2022 01:00   DG Chest Portable 1 View  Result Date: 06/02/2022 CLINICAL DATA:  Shortness of breath. EXAM: PORTABLE CHEST 1 VIEW COMPARISON:  05/16/2022. FINDINGS: The heart is enlarged the mediastinal contour is stable. Atherosclerotic calcification of the aorta is noted. Lung volumes are low. Interstitial prominence is noted bilaterally and likely chronic. No consolidation, effusion, or pneumothorax. Surgical clips are present in the right  chest wall. No acute osseous abnormality. IMPRESSION: 1. Cardiomegaly. 2. Chronic interstitial prominence bilaterally, possible fibrotic changes. Electronically Signed   By: Brett Fairy M.D.   On: 06/02/2022 21:22     Scheduled Meds:  apixaban  5 mg Oral BID   nystatin ointment   Topical BID   Continuous Infusions:  ceFEPime (MAXIPIME) IV       LOS: 1 day    Time spent: 72mn    PDomenic Polite MD [facility]   06/03/2022, 11:03 AM

## 2022-06-03 NOTE — ED Notes (Signed)
Unsuccessful PIV X2. Cefepime not compatible with LR. Will start Vancomycin at this time.

## 2022-06-03 NOTE — ED Notes (Signed)
Patient provided with perineal care. Patient presents to ED with rash underneath bilateral breasts and in groin area. Patient has pre-existing pressure sores on her back and sacral area. Mepiplex and moisture barrier cream applied. Dressings clean dry and intact

## 2022-06-03 NOTE — ED Notes (Signed)
Attempted PIV, unsuccessful attempt.

## 2022-06-03 NOTE — Consult Note (Signed)
Consultation Note Date: 06/03/2022   Patient Name: Natalie West  DOB: 11/02/1931  MRN: [id number]  Age / Sex: 88 y.o., female  PCP: Christain Sacramento, MD Referring Physician: Domenic Polite, MD  Reason for Consultation: Establishing goals of care  HPI/Patient Profile: 88 y.o. female  with past medical history of pulmonary embolism (04/12/2022), chronic atrial fibrillation on Eliquis, hypertension, chronic diastolic heart failure, CKD stage IIIa presented to ED on 06/02/22 from home with AMS and respiratory distress. On arrival to ED, patient was unresponsive and placed on Bipap. Patient was admitted on 06/02/2022 with acute respiratory failure with hypoxia, atrial fibrillation with RVR, candidiasis of breast, severe protein calorie malnutrition.   Of note, patient has had 5 hospital admissions and 2 ED visits in the last 6 months; she is a 30 day readmission. Most recent hospitalization was 7/15-7/18/23 for acute respiratory failure secondary to pneumonia, COPD exacerbation, sepsis.   Patient was previously seen by PMT in June 2023 when she indicated she was interested in hospice services; however, her nephew/HCPOA at the time was not agreeable and outpatient Palliative Care was to follow.  Clinical Assessment and Goals of Care: I have reviewed medical records including EPIC notes, labs, and imaging. Received report from primary RN - no acute concerns. Patient had just arrived from ED to inpatient unit.    Went to visit patient at bedside - no family/visitors present. Patient was lying in bed awake, alert, oriented x3 (person, place, time, not situation), and able to participate in simple conversation. She is not able to make complex medical decisions. She has disorganized thoughts and does not seem to grasp complex medical concepts. No signs or non-verbal gestures of pain or discomfort noted. No respiratory distress, increased  work of breathing, or secretions noted. Patient denies pain or shortness of breath.  Met with patient's son/Greg via phone  to discuss diagnosis, prognosis, GOC, EOL wishes, disposition, and options.  I re-introduced Palliative Medicine as specialized medical care for people living with serious illness. It focuses on providing relief from the symptoms and stress of a serious illness. The goal is to improve quality of life for both the patient and the family.  We discussed patient's interval history since last PMT GOC discussion and hospitalization several weeks ago. Patient continues to live in a private residence with 24/7 private duty caregiver support. [name] has noted a continual gradual decline in her overall health and mentation. [name] reports patient has become increasingly confused.   We discussed patient's current illness and what it means in the larger context of patient's on-going co-morbidities. [name] has a clear understanding of patient's current acute medical situation. He also understands that CHF and COPD are progressive, non-curable disease underlying the patient's current acute medical conditions.  Natural disease trajectory and expectations at EOL were discussed. I attempted to elicit values and goals of care important to the patient. The difference between aggressive medical intervention and comfort care was considered in light of the patient's goals of care.   [name] tells me he is traveling to [place] today for work and will be back next week. He does feel comfort/hospice care would be best for the patient as he "doesn't want to keep putting her through all of this;" however, he is struggling because he does not want her to pass away while he is gone. Reviewed option to medically optimize while she's inpatient, while understanding if she becomes medically stable for discharge we cannot hold discharge for his return - he expressed  understanding.   Provided education and counseling at  length on the philosophy and benefits of hospice care. Discussed that it offers a holistic approach to care in the setting of end-stage illness, and is about supporting the patient where they are allowing nature to take it's course. Discussed the hospice team includes RNs, physicians, social workers, and chaplains. They can provide personal care, support for the family, and help keep patient out of the hospital as well as assist with DME needs for home hospice. Education provided on the difference between home vs residential hospice. Prognostication reviewed. Reviewed if patient is medically optimized, she may no longer quality for residential hospice. Greg's first choice at discharge is residential hospice facility, but if needed returning home with hospice is an option as she already has 24/7 private duty caregivers.  Goal is to medically optimize to try and keep patient stable for Greg's return to [place] next week; discharge to residential hospice if accepted vs home with hospice.   [name] does not have hospice organization preference. Discussed finding residential hospice close to family - he lives closer to [place] but patient's disabled son lives in [place]. He will discuss location preference with his wife tonight and TOC will provide choice one location is determined.   [name] gives permission for his wife/Nina to speak with and make decisions on his behalf while he is out of town/if unavailable by phone. She will be visiting patient tomorrow if medical team needs to speak with her.  Patient's HCPOA indicates Clifford Coudriet as primary; he was patient's husband who has passed away. Barbee Cough is HCPOA #2.   DNR/DNI confirmed.  Discussed with family the importance of continued conversation with each other and the medical providers regarding overall plan of care and treatment options, ensuring decisions are within the context of the patient's values and GOCs.    Questions and concerns were addressed. The  patient/family was encouraged to call with questions and/or concerns. PMT number was provided.   Primary Decision Maker: HCPOA - nephew/Greg Elgie Congo    SUMMARY OF RECOMMENDATIONS   Continue to medically optimize with goal to keep patient stable until nephew/HCPOA can arrive back to  next week Once medically optimized, goal is for discharge to residential hospice. If not approved for residential hospice, home with hospice is second choice. Patient already has 24/7 private duty caregivers Continue DNR/DNI HCPOA/Greg gives permission for his wife/Nina to make decisions on his behalf while he is out of town/if unavailable by phone PMT will continue to follow and support holistically   Code Status/Advance Care Planning: DNR  Palliative Prophylaxis:  Aspiration, Bowel Regimen, Delirium Protocol, Frequent Pain Assessment, Oral Care, and Turn Reposition  Additional Recommendations (Limitations, Scope, Preferences): Avoid Hospitalization, Full Scope Treatment, No Artificial Feeding, and No Tracheostomy  Psycho-social/Spiritual:  Desire for further Chaplaincy support:no Created space and opportunity for patient and family to express thoughts and feelings regarding patient's current medical situation.  Emotional support and therapeutic listening provided.  Prognosis:  Overall poor; with recurrent hospitalizations about every two weeks for respiratory distress, less than 2 weeks would not be surprising if on hospice/comfort care  Discharge Planning: residential hospice vs home hospice     Primary Diagnoses: Present on Admission:  Acute respiratory failure with hypoxia ([place])  Essential hypertension  History of pulmonary embolism   I have reviewed the medical record, interviewed the patient and family, and examined the patient. The following aspects are pertinent.  Past Medical History:  Diagnosis Date   Acute  diastolic heart failure ([place]) 05/07/2021   Acute pulmonary embolism ([place])  04/12/2022   Anxiety    Arthritis    Breast cancer ([place]) 2006   right breast   CAP (community acquired pneumonia) 05/03/2022   Chronic cough    Chronic UTI    Decubitus skin ulcer 05/04/2022   Depression    Dysrhythmia    Esophageal reflux    Esophageal stricture    Family history of malignant neoplasm of gastrointestinal tract    GERD (gastroesophageal reflux disease)    Hearing loss    Hemorrhoids    internal   History of hiatal hernia    Hypercholesterolemia    Hypertension    Hypokalemia 05/04/2022   Numbness and tingling    feeet, occasionally   Osteopenia    Personal history of colonic polyps 02/09/2007   TUBULAR ADENOMA   Personal history of radiation therapy    PONV (postoperative nausea and vomiting)    trouble opening mouth and jaw, jaw pops, trouble turning head   Rotator cuff tear    RT   Status post dilation of esophageal narrowing    Umbilical hernia    Social History   Socioeconomic History   Marital status: Widowed    Spouse name: Not on file   Number of children: 1   Years of education: Not on file   Highest education level: Not on file  Occupational History   Occupation: retired    Fish farm manager: RETIRED  Tobacco Use   Smoking status: Never   Smokeless tobacco: Never  Substance and Sexual Activity   Alcohol use: No    Alcohol/week: 0.0 standard drinks of alcohol   Drug use: No   Sexual activity: Never    Partners: Male  Other Topics Concern   Not on file  Social History Narrative   Not on file   Social Determinants of Health   Financial Resource Strain: Not on file  Food Insecurity: Not on file  Transportation Needs: Not on file  Physical Activity: Not on file  Stress: Not on file  Social Connections: Not on file   Family History  Problem Relation Age of Onset   Colon cancer Mother 29   Kidney cancer Mother    Glaucoma Mother    Heart disease Sister    Stomach cancer Brother    Stroke Sister    Lung cancer Brother    Clotting disorder  Son    Emphysema Father        smoker   Hypertension Sister    Scheduled Meds:  amitriptyline  25 mg Oral QHS   apixaban  5 mg Oral BID   empagliflozin  10 mg Oral Daily   ipratropium-albuterol  3 mL Nebulization QID   methylPREDNISolone (SOLU-MEDROL) injection  40 mg Intravenous Q12H   metoprolol tartrate  25 mg Oral BID   mirtazapine  15 mg Oral QHS   nystatin ointment   Topical BID   pantoprazole  40 mg Oral Q1200   pravastatin  20 mg Oral q1800   sertraline  25 mg Oral Daily   Continuous Infusions:  ceFEPime (MAXIPIME) IV     PRN Meds:. Medications Prior to Admission:  Prior to Admission medications   Medication Sig Start Date End Date Taking? Authorizing Provider  acetaminophen (TYLENOL) 325 MG tablet Take 2 tablets (650 mg total) by mouth every 6 (six) hours as needed. Patient taking differently: Take 650 mg by mouth every 6 (six) hours as needed for moderate pain  or headache. 01/28/22  Yes Maczis, Barth Kirks, PA-C  albuterol (PROVENTIL) (2.5 MG/3ML) 0.083% nebulizer solution Take 2.5 mg by nebulization 2 (two) times daily. 04/30/22  Yes [provider]  amitriptyline (ELAVIL) 25 MG tablet Take 1 tablet (25 mg total) by mouth in the morning and at bedtime. 04/16/22  Yes Barton Dubois, MD  apixaban (ELIQUIS) 5 MG TABS tablet Take 1 tablet (5 mg total) by mouth 2 (two) times daily. 05/07/22  Yes Shelly Coss, MD  empagliflozin (JARDIANCE) 10 MG TABS tablet Take 1 tablet (10 mg total) by mouth daily. 05/08/22  Yes Shelly Coss, MD  furosemide (LASIX) 20 MG tablet Take 1 tablet (20 mg total) by mouth daily as needed for fluid or edema. 04/16/22  Yes Barton Dubois, MD  leptospermum manuka honey (MEDIHONEY) PSTE paste Apply 1 Application topically daily. Patient taking differently: Apply 1 Application topically daily as needed (wound care). 05/20/22  Yes Donne Hazel, MD  megestrol (MEGACE) 400 MG/10ML suspension Take 10 mLs (400 mg total) by mouth 2 (two) times daily.  04/16/22 06/15/22 Yes Barton Dubois, MD  metoprolol tartrate (LOPRESSOR) 25 MG tablet Take 1 tablet (25 mg total) by mouth 2 (two) times daily. 05/07/22  Yes Shelly Coss, MD  mirtazapine (REMERON) 15 MG tablet Take 1 tablet (15 mg total) by mouth at bedtime. 04/16/22  Yes Barton Dubois, MD  Multiple Vitamin (MULTIVITAMIN WITH MINERALS) TABS tablet Take 1 tablet by mouth every morning. Centrum Silver for Women   Yes [provider]  pantoprazole (PROTONIX) 40 MG tablet Take 1 tablet (40 mg total) by mouth daily. 1 Tablet Daily for Heartburn and Acid Reflux Patient taking differently: Take 40 mg by mouth every evening. 04/16/22  Yes Barton Dubois, MD  polyethylene glycol (MIRALAX / GLYCOLAX) 17 g packet Take 17 g by mouth 2 (two) times daily as needed for mild constipation. Patient taking differently: Take 17 g by mouth daily as needed for mild constipation. 01/28/22  Yes Maczis, Barth Kirks, PA-C  pravastatin (PRAVACHOL) 20 MG tablet Take 20 mg by mouth at bedtime.    Yes [provider]  sertraline (ZOLOFT) 25 MG tablet Take 1 tablet (25 mg total) by mouth daily. 04/16/22  Yes Barton Dubois, MD  azithromycin (ZITHROMAX) 250 MG tablet 1 tab po daily x 3 more days Patient not taking: Reported on 06/03/2022 05/19/22   Donne Hazel, MD  benzonatate (TESSALON) 100 MG capsule Take 1 capsule (100 mg total) by mouth every 8 (eight) hours. Patient not taking: Reported on 06/03/2022 05/19/22   Donne Hazel, MD   Allergies  Allergen Reactions   Cipro [Ciprofloxacin Hcl] Other (See Comments)    Unknown reaction   Cleocin [Clindamycin] Other (See Comments)    Unknown reaction   Lincocin [Lincomycin Hcl] Other (See Comments)    Unknown reaction   Codeine Nausea And Vomiting   Flexeril [Cyclobenzaprine Hcl] Other (See Comments)    Unknown reaction   Hydrocodone Other (See Comments)    "Last time it was too strong and messed up my mind"   Macrobid [Nitrofurantoin] Other (See Comments)     Unknown reaction   Nalfon [Fenoprofen Calcium] Other (See Comments)    Unknown reaction   Noroxin [Norfloxacin] Other (See Comments)    Unknown reaction    Relafen [Nabumetone] Other (See Comments)    Unknown reaction    Sulfa Antibiotics Other (See Comments)    Unknown reaction   Voltaren [Diclofenac] Other (See Comments)    Unknown  reaction    Review of Systems  Constitutional:  Positive for fatigue.  Respiratory:  Negative for shortness of breath.   Gastrointestinal:  Negative for nausea and vomiting.  Neurological:  Positive for weakness.  All other systems reviewed and are negative.   Physical Exam Vitals and nursing note reviewed.  Constitutional:      General: She is not in acute distress.    Appearance: She is ill-appearing.  Pulmonary:     Effort: No respiratory distress.  Skin:    General: Skin is warm and dry.  Neurological:     Mental Status: She is alert and oriented to person, place, and time. She is confused.     Motor: Weakness present.  Psychiatric:        Attention and Perception: She is inattentive.        Behavior: Behavior is cooperative.        Cognition and Memory: Cognition is impaired. Memory is impaired.     Vital Signs: BP 110/84 (BP Location: Left Arm)   Pulse 93   Temp 98.5 F (36.9 C) (Oral)   Resp (!) 21   Ht 5' 2"  (1.575 m)   Wt 53.6 kg   LMP 11/03/1983   SpO2 97%   BMI 21.61 kg/m  Pain Scale: 0-10   Pain Score: Asleep   SpO2: SpO2: 97 % O2 Device:SpO2: 97 % O2 Flow Rate: .O2 Flow Rate (L/min): 1 L/min  IO: Intake/output summary:  Intake/Output Summary (Last 24 hours) at 06/03/2022 1217 Last data filed at 06/03/2022 1112 Gross per 24 hour  Intake 96.69 ml  Output 250 ml  Net -153.31 ml    LBM:   Baseline Weight: Weight: 53.6 kg Most recent weight: Weight: 53.6 kg     Palliative Assessment/Data: PPS 30%     Time In: 1230 Time Out: 1400 Time Total: 90 minutes  Greater than 50%  of this time was spent  counseling and coordinating care related to the above assessment and plan.  Signed by: Lin Landsman, NP   Please contact Palliative Medicine Team phone at [phone] for questions and concerns.  For individual provider: See Amion  *Portions of this note are a verbal dictation therefore any spelling and/or grammatical errors are due to the "[place]" system interpretation.

## 2022-06-03 NOTE — TOC Initial Note (Signed)
Transition of Care [facility]) - Initial/Assessment Note    Patient Details  Name: Natalie West MRN: [id number] Date of Birth: 07/23/1931  [facility] CM/SW Contact:    Cyndi Bender, RN Phone Number: 06/03/2022, 2:36 PM  Clinical Narrative:                 Luetta Nutting with Palliative team spoke to nephew, [name]. [name] would like patient to discharge to residential hospice if appropriate. Greg's gave permission for his wife to make decisions while he is out of town. Greg's wife will be at the hospital tomorrow and he requested TOC give her hospice options at that time.  Expected Discharge Plan: [place] Barriers to Discharge: Continued Medical Work up   Patient Goals and CMS Choice        Expected Discharge Plan and Services Expected Discharge Plan: [place]                                              Prior Living Arrangements/Services                       Activities of Daily Living      Permission Sought/Granted                  Emotional Assessment              Admission diagnosis:  COPD exacerbation ([place]) [J44.1] Acute respiratory failure with hypoxia ([place]) [J96.01] Patient Active Problem List   Diagnosis Date Noted   Chronic atrial fibrillation with RVR ([place]) 06/02/2022   Candidiasis of breast 06/02/2022   Pneumonia 05/17/2022   CAP (community acquired pneumonia) 05/16/2022   Acute respiratory failure with hypoxia ([place]) 05/16/2022   Cellulitis 05/16/2022   History of pulmonary embolism 05/05/2022   Atrial fibrillation, chronic ([place]) 05/03/2022   Essential hypertension 05/03/2022   Protein-calorie malnutrition, moderate ([place]) 05/03/2022   Depression 05/03/2022   Acute on chronic diastolic CHF (congestive heart failure) ([place]) 05/02/2022   Pressure injury of skin 04/14/2022   Cervical spine fracture ([place]) 01/21/2022   Mallet finger of left finger(s) 10/08/2016   Recurrent urinary tract  infection 01/20/2015   Cystocele 01/20/2015   Constipation 08/22/2014   Rotator cuff (capsule) sprain 02/28/2013   Breast cancer of lower-outer quadrant of right female breast [facility])    Internal hemorrhoids 06/28/2011   Gastroenteritis 06/28/2011   Hyperlipidemia 02/18/2008   GERD 02/18/2008   HIATAL HERNIA 02/18/2008   OSTEOARTHRITIS 02/18/2008   COLONIC POLYPS, ADENOMATOUS 02/09/2007   DIVERTICULITIS, ACUTE 02/09/2007   ESOPHAGEAL STRICTURE 11/19/1998   PCP:  Christain Sacramento, MD Pharmacy:   [id number] [place], [place] - [address] [place] [id number]: [phone]: [phone]    Social Determinants of Health (SDOH) Interventions    Readmission Risk Interventions    05/19/2022    5:16 PM 04/16/2022   12:20 PM  Readmission Risk Prevention Plan  Transportation Screening Complete Complete  PCP or Specialist Appt within 5-7 Days  Complete  PCP or Specialist Appt within 3-5 Days Complete   Home Care Screening  Complete  Medication Review (RN CM)  Complete  HRI or Home Care Consult Complete   Social Work Consult for [place]/Counseling Complete  Palliative Care Screening Not Applicable   Medication Review (RN Care Manager) Referral to Pharmacy

## 2022-06-04 DIAGNOSIS — J9601 Acute respiratory failure with hypoxia: Secondary | ICD-10-CM | POA: Diagnosis not present

## 2022-06-04 LAB — CBC
HCT: 38.3 % (ref 36.0–46.0)
Hemoglobin: 12.4 g/dL (ref 12.0–15.0)
MCH: 34.3 pg — ABNORMAL HIGH (ref 26.0–34.0)
MCHC: 32.4 g/dL (ref 30.0–36.0)
MCV: 105.8 fL — ABNORMAL HIGH (ref 80.0–100.0)
Platelets: 171 10*3/uL (ref 150–400)
RBC: 3.62 MIL/uL — ABNORMAL LOW (ref 3.87–5.11)
RDW: 16.5 % — ABNORMAL HIGH (ref 11.5–15.5)
WBC: 14.6 10*3/uL — ABNORMAL HIGH (ref 4.0–10.5)
nRBC: 0 % (ref 0.0–0.2)

## 2022-06-04 LAB — BASIC METABOLIC PANEL
Anion gap: 8 (ref 5–15)
BUN: 26 mg/dL — ABNORMAL HIGH (ref 8–23)
CO2: 21 mmol/L — ABNORMAL LOW (ref 22–32)
Calcium: 8.2 mg/dL — ABNORMAL LOW (ref 8.9–10.3)
Chloride: 108 mmol/L (ref 98–111)
Creatinine, Ser: 0.9 mg/dL (ref 0.44–1.00)
GFR, Estimated: >60 mL/min (ref >60)
Glucose, Bld: 134 mg/dL — ABNORMAL HIGH (ref 70–99)
Potassium: 4.2 mmol/L (ref 3.5–5.1)
Sodium: 137 mmol/L (ref 135–145)

## 2022-06-04 MED ORDER — FUROSEMIDE 10 MG/ML IJ SOLN
20.0000 mg | Freq: Two times a day (BID) | INTRAMUSCULAR | Status: AC
Start: 2022-06-04 — End: 2022-06-04
  Administered 2022-06-04 (×2): 20 mg via INTRAVENOUS
  Filled 2022-06-04 (×2): qty 2

## 2022-06-04 MED ORDER — MEDIHONEY WOUND/BURN DRESSING EX PSTE
1.0000 | PASTE | Freq: Every day | CUTANEOUS | Status: DC
  Administered 2022-06-04 – 2022-06-13 (×10): 1 via TOPICAL
  Filled 2022-06-04: qty 44

## 2022-06-04 MED ORDER — NYSTATIN 100000 UNIT/GM EX POWD
Freq: Two times a day (BID) | CUTANEOUS | Status: DC
  Administered 2022-06-10: 1 via TOPICAL
  Filled 2022-06-04 (×2): qty 15

## 2022-06-04 MED ORDER — PREDNISONE 20 MG PO TABS
40.0000 mg | ORAL_TABLET | Freq: Every day | ORAL | Status: DC
  Administered 2022-06-04 – 2022-06-05 (×2): 40 mg via ORAL
  Filled 2022-06-04 (×2): qty 2

## 2022-06-04 NOTE — Progress Notes (Signed)
Daily Progress Note   Patient Name: Natalie West       Date: 06/04/2022 DOB: January 23, 1931  Age: 88 y.o. MRN#: [id number] Attending Physician: Domenic Polite, MD Primary Care Physician: Christain Sacramento, MD Admit Date: 06/02/2022  Reason for Consultation/Follow-up: Establishing goals of care  Subjective: Medical records reviewed including progress notes, labs. Patient assessed at the bedside. Discussed with RN.  She denies any concerns and is in no acute distress.  Her family member [name] is present visiting at the bedside.  Patient and family confirm their desire to continue with stabilizing effort and medical interventions until her nephew [name] returns.  [name] shares that she has just talked to social work and informed of their decision for residential hospice in [place], as this is closer to home.  Questions and concerns addressed.  PMT contact information provided.  PMT will continue to support holistically.   Length of Stay: 2   Physical Exam Vitals and nursing note reviewed.  Constitutional:      General: She is not in acute distress.    Appearance: She is ill-appearing.  Cardiovascular:     Rate and Rhythm: Normal rate.  Pulmonary:     Effort: Pulmonary effort is normal.  Skin:    General: Skin is warm and dry.  Neurological:     Mental Status: She is alert.  Psychiatric:        Mood and Affect: Mood normal.        Behavior: Behavior normal.             Vital Signs: BP 114/76 (BP Location: Left Arm)   Pulse 87   Temp 98.4 F (36.9 C) (Oral)   Resp (!) 22   Ht '5\' 2"'$  (1.575 m)   Wt 53.6 kg   LMP 11/03/1983   SpO2 95%   BMI 21.61 kg/m  SpO2: SpO2: 95 % O2 Device: O2 Device: Room Air O2 Flow Rate: O2 Flow Rate (L/min): 1 L/min      Palliative  Assessment/Data:     Palliative Care Assessment & Plan   Patient Profile: 88 y.o. female  with past medical history of pulmonary embolism (04/12/2022), chronic atrial fibrillation on Eliquis, hypertension, chronic diastolic heart failure, CKD stage IIIa presented to ED on 06/02/22 from home with AMS and respiratory distress. On arrival to  ED, patient was unresponsive and placed on Bipap. Patient was admitted on 06/02/2022 with acute respiratory failure with hypoxia, atrial fibrillation with RVR, candidiasis of breast, severe protein calorie malnutrition.    Of note, patient has had 5 hospital admissions and 2 ED visits in the last 6 months; she is a 30 day readmission. Most recent hospitalization was 7/15-7/18/23 for acute respiratory failure secondary to pneumonia, COPD exacerbation, sepsis.    Patient was previously seen by PMT in June 2023 when she indicated she was interested in hospice services; however, her nephew/HCPOA at the time was not agreeable and outpatient Palliative Care was to follow.  Assessment: Goals of care conversation Acute respiratory failure with hypoxia Acute on chronic diastolic CHF COPD exacerbation   Recommendations/Plan: Continue DNR Continue current care for medical optimization Patient's family has selected residential hospice of choice in [place], would be agreeable to hospice at home as well if not eligible PMT will continue to follow and support holistically   Prognosis:  Overall poor; with recurrent hospitalizations about every two weeks for respiratory distress, less than 2 weeks would not be surprising if on hospice/comfort care  Discharge Planning: Residential hospice or home hospice  Care plan was discussed with patient, RN, HCPOA's wife [name]   Total time: I spent 25 minutes in the care of the patient today in the above activities and documenting the encounter.   Saige Canton Johnnette Litter, PA-C  Palliative Medicine Team Team phone #  [phone]  Thank you for allowing the Palliative Medicine Team to assist in the care of this patient. Please utilize secure chat with additional questions, if there is no response within 30 minutes please call the above phone number.  Palliative Medicine Team providers are available by phone from 7am to 7pm daily and can be reached through the team cell phone.  Should this patient require assistance outside of these hours, please call the patient's attending physician.

## 2022-06-04 NOTE — TOC Progression Note (Signed)
Transition of Care [facility]) - Progression Note    Patient Details  Name: MERCADEZ HEITMAN MRN: [id number] Date of Birth: July 27, 1931  Transition of Care [facility]) CM/SW [place], LCSW Phone Number: 06/04/2022, 12:53 PM  Clinical Narrative:    CSW met with Greg's wife, [name], at bedside. They are requesting the hospice facility in [place], [facility] ([facility])). CSW made [name] aware that transport cost will be out of pocket up front and she stated agreement. CSW contacted [facility] and they will have their referral specialist contact CSW.    Expected Discharge Plan: [place] Barriers to Discharge: Continued Medical Work up  Expected Discharge Plan and Services Expected Discharge Plan: [place]                                               Social Determinants of Health (SDOH) Interventions    Readmission Risk Interventions    05/19/2022    5:16 PM 04/16/2022   12:20 PM  Readmission Risk Prevention Plan  Transportation Screening Complete Complete  PCP or Specialist Appt within 5-7 Days  Complete  PCP or Specialist Appt within 3-5 Days Complete   Home Care Screening  Complete  Medication Review (RN CM)  Complete  HRI or Home Care Consult Complete   Social Work Consult for [place] Planning/Counseling Complete   Palliative Care Screening Not Applicable   Medication Review Press photographer) Referral to Pharmacy

## 2022-06-04 NOTE — Consult Note (Signed)
[place] Nurse wound consult note Consultation was completed by review of records, images and assistance from the bedside nurse/clinical staff.   Reason for Consult:severe yeast under the breast and in the groin Patient determined to have Unstageable pressure injuries at her last admission; will make sure the wound care orders are updated Wound type: fungal, ICD (irritant contact dermatitis)  ICD-10 CM Codes for Irritant Dermatitis L24A2 - Due to fecal, urinary or dual incontinence L30.4  - Erythema intertrigo. Also used for abrasion of the hand, chafing of the skin, dermatitis due to sweating and friction, friction dermatitis, friction eczema, and genital/thigh intertrigo.  Pressure Injury POA: NA Wound bed: see nursing flow sheets; back, inframmamory, groin Drainage (amount, consistency, odor) see nursing flow sheets Periwound: intact  Dressing procedure/placement/frequency:  Add silver antimicrobial wicking fabric for moisture management and fungal over growth Medihoney for vertebral wounds, top with dry dressing and foam Low air loss mattress for moisture management and pressure re-distribution   Re consult if needed, will not follow at this time. Thanks  Jackelin Correia [employer], RN,CWOCN, CNS, [place] [phone])

## 2022-06-04 NOTE — Progress Notes (Signed)
Speech Language Pathology Treatment: Dysphagia  Patient Details Name: Natalie West MRN: [id number] DOB: [birth year] Today's Date: 06/04/2022 Time: 1425-1450 SLP Time Calculation (min) (ACUTE ONLY): 25 min  Assessment / Plan / Recommendation Clinical Impression  Patient seen by SLP for skilled treatment focused on dysphagia goals. Patient was awake and alert and RN was setting her up with her lunch tray. Patient able to feed self using utensils without difficulty but benefited from assistance with opening items, etc. She exhibited mildly prolonged mastication and oral transit of solids but no significant residuals post swallows. She did not exhibit any overt s/s aspiration or penetration with cup or straw sips of nectar thick soda. Voice remained WFL. SLP did observe patient's SpO2 to decrease to 85% briefly at times, but this did not appear entirely correlated with PO intake. Patient does not have supplemental oxygen requirement at this time. SLP will plan to f/u at least one more time to ensure she is on least restrictive diet and to determine if can upgrade liquids to thin consistency.    HPI HPI: 89/F with history of chronic diastolic CHF, history of PE, chronic A-fib on Eliquis, hypertension, CKD 3, suspected COPD, cognitive deficits, frailty-wheelchair/bedbound, multiple recent hospitalizations was brought to the ED with decreased responsiveness and dyspnea  -Recently hospitalized with healthcare associated pneumonia, this is her fourth hospitalization in 2 months, currently lives at home with caregivers. Pt seen on 7/3 by SLP who observed coughign with thin liquids. MBS completed, pt had normal swallowing function except for prlonned mastication. There was no coughing on MBS and no coughing in f/u visit. Pt had EGD in 2018 that showed a stricture. Esophageal sweep on 7/3 appeared WNL though no radiologist present to confirm.      SLP Plan  Continue with current plan of care      Recommendations  for follow up therapy are one component of a multi-disciplinary discharge planning process, led by the attending physician.  Recommendations may be updated based on patient status, additional functional criteria and insurance authorization.    Recommendations  Diet recommendations: Nectar-thick liquid;Dysphagia 3 (mechanical soft) Liquids provided via: Cup;Straw Medication Administration: Whole meds with puree Supervision: Patient able to self feed;Intermittent supervision to cue for compensatory strategies Compensations: Slow rate;Small sips/bites Postural Changes and/or Swallow Maneuvers: Seated upright 90 degrees                Oral Care Recommendations: Oral care BID Follow Up Recommendations: Follow physician's recommendations for discharge plan and follow up therapies Assistance recommended at discharge: Intermittent Supervision/Assistance SLP Visit Diagnosis: Dysphagia, unspecified (R13.10) Plan: Continue with current plan of care          Sonia Baller, MA, CCC-SLP Speech Therapy

## 2022-06-04 NOTE — Progress Notes (Signed)
[facility]) Hospital Liaison note:  This is a pending outpatient-based Palliative Care patient. Will continue to follow for disposition.  Please call with any outpatient palliative questions or concerns.  Thank you, Lorelee Market, LPN [facility] Hospital Liaison [phone]

## 2022-06-04 NOTE — Progress Notes (Signed)
PROGRESS NOTE    Natalie West  IWL:[id number] DOB: [birth year] DOA: 06/02/2022 PCP: Christain Sacramento, MD  [name] with history of chronic diastolic CHF, history of PE, chronic A-fib on Eliquis, hypertension, CKD 3, suspected COPD, cognitive deficits, frailty-wheelchair/bedbound, multiple recent hospitalizations was brought to the ED with decreased responsiveness and dyspnea -Recently hospitalized with healthcare associated pneumonia, this is her fourth hospitalization in 2 months, currently lives at home with caregivers  Subjective: -Breathing better, worried that she is not getting all her meds, confused about her medications  Assessment and Plan:  Acute respiratory failure with hypoxia (HCC) -Required BiPAP on admission, now off -Clinically suspect diastolic CHF, and COPD exacerbation as contributing -CTA chest on admission no PE, bibasilar atelectasis and small pleural effusions -Repeat IV Lasix today, continue Toprol and Jardiance, will discontinue antibiotics -Switch to prednisone taper, continue DuoNebs and pulmonary toilet -appreciate SLP eval, dysphagia with moderate aspiration risk noted-D3 with nectar thick liquids recommended  ETHICS: [name] with frequent hospitalizations, fourth admission in 2 months, CHF, COPD, recurrent pneumonia, cognitive deficits, failure to thrive, bedbound -Discussed poor prognosis with nephew Belenda Cruise, appreciate palliative care input, hospice being considered  Acute on chronic diastolic CHF -As above  COPD exacerbation -Non-smoker, exposed to secondhand smoking for 40+ years -IV steroids, switch to prednisone taper, continue DuoNebs as noted above  Essential hypertension -Stable, monitor with diuretics  History of DVT/pulmonary embolism Continue Eliquis.  Candidiasis of breast Nystatin ointment BID  Chronic atrial fibrillation with RVR ([place]) -Heart rate improved, continue Toprol, continue Eliquis  Severe protein calorie malnutrition  Frailty,  wheelchair/bedbound  DVT prophylaxis: Eliquis Code Status: DNR Family Communication: No family at bedside, called and updated nephew/POA Belenda Cruise 8/2 Disposition Plan: Hospice anticipated  Consultants: Palliative care   Procedures:   Antimicrobials:    Objective: Vitals:   06/03/22 2000 06/04/22 0000 06/04/22 0400 06/04/22 0800  BP: 103/71 116/77 118/63 122/70  Pulse: 90 78 89 95  Resp: (!) '22 15 20 17  '$ Temp: 98.4 F (36.9 C) 98.2 F (36.8 C) 98 F (36.7 C) 98.1 F (36.7 C)  TempSrc: Oral Oral Axillary Oral  SpO2: 95% 95% 93% 96%  Weight:      Height:        Intake/Output Summary (Last 24 hours) at 06/04/2022 1152 Last data filed at 06/04/2022 0500 Gross per 24 hour  Intake 200 ml  Output 800 ml  Net -600 ml   Filed Weights   06/02/22 2046  Weight: 53.6 kg    Examination:  General exam: Elderly chronically ill female laying in bed, awake alert, oriented to self and partly to place, moderate cognitive deficits CVS: S1-S2, regular rhythm Lungs: Poor air movement, few basilar rales Abdomen: Soft, nontender, bowel sounds present Extremities: No edema  Psych: Poor insight and judgment   Data Reviewed:   CBC: Recent Labs  Lab 06/02/22 2048 06/02/22 2214 06/03/22 0342 06/04/22 0451  WBC 16.8*  --  14.0* 14.6*  NEUTROABS 12.7*  --   --   --   HGB 12.5 12.6 11.7* 12.4  HCT 39.7 37.0 37.4 38.3  MCV 108.8*  --  107.8* 105.8*  PLT 231  --  177 174   Basic Metabolic Panel: Recent Labs  Lab 06/02/22 2048 06/02/22 2214 06/03/22 0342 06/04/22 0451  NA 140 142 139 137  K 4.7 4.0 4.3 4.2  CL 114*  --  112* 108  CO2 22  --  22 21*  GLUCOSE 225*  --  163* 134*  BUN  22  --  20 26*  CREATININE 0.99  --  0.87 0.90  CALCIUM 8.3*  --  8.3* 8.2*   GFR: Estimated Creatinine Clearance: 34.2 mL/min (by C-G formula based on SCr of 0.9 mg/dL). Liver Function Tests: Recent Labs  Lab 06/02/22 2048  AST 23  ALT 13  ALKPHOS 51  BILITOT 0.5  PROT 6.0*  ALBUMIN  3.2*   Recent Labs  Lab 06/02/22 2048  LIPASE 29   No results for input(s): "AMMONIA" in the last 168 hours. Coagulation Profile: No results for input(s): "INR", "PROTIME" in the last 168 hours. Cardiac Enzymes: No results for input(s): "CKTOTAL", "CKMB", "CKMBINDEX", "TROPONINI" in the last 168 hours. BNP (last 3 results) No results for input(s): "PROBNP" in the last 8760 hours. HbA1C: No results for input(s): "HGBA1C" in the last 72 hours. CBG: No results for input(s): "GLUCAP" in the last 168 hours. Lipid Profile: No results for input(s): "CHOL", "HDL", "LDLCALC", "TRIG", "CHOLHDL", "LDLDIRECT" in the last 72 hours. Thyroid Function Tests: No results for input(s): "TSH", "T4TOTAL", "FREET4", "T3FREE", "THYROIDAB" in the last 72 hours. Anemia Panel: No results for input(s): "VITAMINB12", "FOLATE", "FERRITIN", "TIBC", "IRON", "RETICCTPCT" in the last 72 hours. Urine analysis:    Component Value Date/Time   COLORURINE YELLOW 06/03/2022 0600   APPEARANCEUR HAZY (A) 06/03/2022 0600   LABSPEC 1.037 (H) 06/03/2022 0600   PHURINE 5.0 06/03/2022 0600   GLUCOSEU >=500 (A) 06/03/2022 0600   HGBUR NEGATIVE 06/03/2022 0600   BILIRUBINUR NEGATIVE 06/03/2022 0600   BILIRUBINUR n 02/18/2015 1404   KETONESUR NEGATIVE 06/03/2022 0600   PROTEINUR NEGATIVE 06/03/2022 0600   UROBILINOGEN negative 02/18/2015 1404   UROBILINOGEN 0.2 02/28/2013 0739   NITRITE NEGATIVE 06/03/2022 0600   LEUKOCYTESUR TRACE (A) 06/03/2022 0600   Sepsis Labs: '@LABRCNTIP'$ (procalcitonin:4,lacticidven:4)  ) Recent Results (from the past 240 hour(s))  SARS Coronavirus 2 by RT PCR (hospital order, performed in [place] hospital lab) *cepheid single result test* Anterior Nasal Swab     Status: None   Collection Time: 06/02/22  8:48 PM   Specimen: Anterior Nasal Swab  Result Value Ref Range Status   SARS Coronavirus 2 by RT PCR NEGATIVE NEGATIVE Final    Comment: (NOTE) SARS-CoV-2 target nucleic acids are NOT  DETECTED.  The SARS-CoV-2 RNA is generally detectable in upper and lower respiratory specimens during the acute phase of infection. The lowest concentration of SARS-CoV-2 viral copies this assay can detect is 250 copies / mL. A negative result does not preclude SARS-CoV-2 infection and should not be used as the sole basis for treatment or other patient management decisions.  A negative result may occur with improper specimen collection / handling, submission of specimen other than nasopharyngeal swab, presence of viral mutation(s) within the areas targeted by this assay, and inadequate number of viral copies (<250 copies / mL). A negative result must be combined with clinical observations, patient history, and epidemiological information.  Fact Sheet for Patients:   https://www.patel.info/  Fact Sheet for Healthcare Providers: https://hall.com/  This test is not yet approved or  cleared by the [place] FDA and has been authorized for detection and/or diagnosis of SARS-CoV-2 by FDA under an Emergency Use Authorization (EUA).  This EUA will remain in effect (meaning this test can be used) for the duration of the COVID-19 declaration under Section 564(b)(1) of the Act, 21 U.S.C. section 360bbb-3(b)(1), unless the authorization is terminated or revoked sooner.  Performed at [facility], [place] [address], [place], [place] [address]  Blood culture (routine x 2)     Status: None (Preliminary result)   Collection Time: 06/02/22 11:22 PM   Specimen: BLOOD RIGHT FOREARM  Result Value Ref Range Status   Specimen Description BLOOD RIGHT FOREARM  Final   Special Requests   Final    BOTTLES DRAWN AEROBIC AND ANAEROBIC Blood Culture results may not be optimal due to an inadequate volume of blood received in culture bottles   Culture   Final    NO GROWTH < 12 HOURS Performed at [facility], [place]. [address], [place], [place]  [address]    Report Status PENDING  Incomplete  Blood culture (routine x 2)     Status: None (Preliminary result)   Collection Time: 06/03/22 12:07 AM   Specimen: BLOOD  Result Value Ref Range Status   Specimen Description BLOOD SITE NOT SPECIFIED  Final   Special Requests   Final    BOTTLES DRAWN AEROBIC AND ANAEROBIC Blood Culture adequate volume   Culture   Final    NO GROWTH < 12 HOURS Performed at [facility], [place] [address], [place], [place] [address]    Report Status PENDING  Incomplete  MRSA Next Gen by PCR, Nasal     Status: None   Collection Time: 06/03/22 12:07 AM   Specimen: Nasal Mucosa; Nasal Swab  Result Value Ref Range Status   MRSA by PCR Next Gen NOT DETECTED NOT DETECTED Final    Comment: (NOTE) The GeneXpert MRSA Assay (FDA approved for NASAL specimens only), is one component of a comprehensive MRSA colonization surveillance program. It is not intended to diagnose MRSA infection nor to guide or monitor treatment for MRSA infections. Test performance is not FDA approved in patients less than 17 years old. Performed at [facility], [place] [address] [place], [place] [address]      Radiology Studies: CT Angio Chest Pulmonary Embolism (PE) W or WO Contrast  Result Date: 06/03/2022 CLINICAL DATA:  Shortness of breath EXAM: CT ANGIOGRAPHY CHEST WITH CONTRAST TECHNIQUE: Multidetector CT imaging of the chest was performed using the standard protocol during bolus administration of intravenous contrast. Multiplanar CT image reconstructions and MIPs were obtained to evaluate the vascular anatomy. RADIATION DOSE REDUCTION: This exam was performed according to the departmental dose-optimization program which includes automated exposure control, adjustment of the mA and/or kV according to patient size and/or use of iterative reconstruction technique. CONTRAST:  12m OMNIPAQUE IOHEXOL 350 MG/ML SOLN COMPARISON:  05/16/2022 FINDINGS: Cardiovascular: Atherosclerotic  calcifications of the thoracic aorta are noted. No aneurysmal dilatation is seen. No cardiac enlargement is noted. Pulmonary artery shows a normal branching pattern without intraluminal filling defect to suggest pulmonary embolism. Coronary calcifications are noted. Mediastinum/Nodes: Esophagus is within normal limits. Thoracic inlet is within normal limits. No hilar or mediastinal adenopathy is noted. Lungs/Pleura: Mosaic attenuation is noted consistent with air trapping. Emphysematous changes are seen. Basilar atelectatic changes and small effusions are noted. No focal confluent infiltrate is seen. No sizable parenchymal nodule is noted. Upper Abdomen: Visualized upper abdomen shows right renal cyst. No follow-up is recommended. No other focal abnormality is seen. Musculoskeletal: Degenerative changes of the thoracic spine are seen. Review of the MIP images confirms the above findings. IMPRESSION: No evidence of pulmonary emboli. Bibasilar atelectasis and small effusions. No other focal abnormality is noted. Electronically Signed   By: MInez CatalinaM.D.   On: 06/03/2022 01:00   DG Chest Portable 1 View  Result Date: 06/02/2022 CLINICAL  DATA:  Shortness of breath. EXAM: PORTABLE CHEST 1 VIEW COMPARISON:  05/16/2022. FINDINGS: The heart is enlarged the mediastinal contour is stable. Atherosclerotic calcification of the aorta is noted. Lung volumes are low. Interstitial prominence is noted bilaterally and likely chronic. No consolidation, effusion, or pneumothorax. Surgical clips are present in the right chest wall. No acute osseous abnormality. IMPRESSION: 1. Cardiomegaly. 2. Chronic interstitial prominence bilaterally, possible fibrotic changes. Electronically Signed   By: Brett Fairy M.D.   On: 06/02/2022 21:22     Scheduled Meds:  amitriptyline  25 mg Oral QHS   apixaban  5 mg Oral BID   empagliflozin  10 mg Oral Daily   metoprolol tartrate  25 mg Oral BID   mirtazapine  15 mg Oral QHS   nystatin  ointment   Topical BID   pantoprazole  40 mg Oral Q1200   pravastatin  20 mg Oral q1800   predniSONE  40 mg Oral Q breakfast   sertraline  25 mg Oral Daily   Continuous Infusions:  ceFEPime (MAXIPIME) IV Stopped (06/04/22 0221)     LOS: 2 days    Time spent: 11mn    PDomenic Polite MD [facility]   06/04/2022, 11:52 AM

## 2022-06-05 DIAGNOSIS — J9601 Acute respiratory failure with hypoxia: Secondary | ICD-10-CM | POA: Diagnosis not present

## 2022-06-05 LAB — BASIC METABOLIC PANEL
Anion gap: 8 (ref 5–15)
BUN: 34 mg/dL — ABNORMAL HIGH (ref 8–23)
CO2: 26 mmol/L (ref 22–32)
Calcium: 8.2 mg/dL — ABNORMAL LOW (ref 8.9–10.3)
Chloride: 105 mmol/L (ref 98–111)
Creatinine, Ser: 1.13 mg/dL — ABNORMAL HIGH (ref 0.44–1.00)
GFR, Estimated: 47 mL/min — ABNORMAL LOW (ref >60)
Glucose, Bld: 119 mg/dL — ABNORMAL HIGH (ref 70–99)
Potassium: 3 mmol/L — ABNORMAL LOW (ref 3.5–5.1)
Sodium: 139 mmol/L (ref 135–145)

## 2022-06-05 MED ORDER — FUROSEMIDE 20 MG PO TABS
20.0000 mg | ORAL_TABLET | Freq: Every day | ORAL | Status: DC
  Administered 2022-06-05 – 2022-06-13 (×9): 20 mg via ORAL
  Filled 2022-06-05 (×9): qty 1

## 2022-06-05 MED ORDER — PREDNISONE 20 MG PO TABS
20.0000 mg | ORAL_TABLET | Freq: Every day | ORAL | Status: DC
Start: 2022-06-06 — End: 2022-06-13
  Administered 2022-06-06 – 2022-06-13 (×8): 20 mg via ORAL
  Filled 2022-06-05 (×8): qty 1

## 2022-06-05 MED ORDER — POTASSIUM CHLORIDE 20 MEQ PO PACK
40.0000 meq | PACK | Freq: Two times a day (BID) | ORAL | Status: DC
  Administered 2022-06-06 – 2022-06-12 (×13): 40 meq via ORAL
  Filled 2022-06-05 (×13): qty 2

## 2022-06-05 MED ORDER — POTASSIUM CHLORIDE CRYS ER 20 MEQ PO TBCR
40.0000 meq | EXTENDED_RELEASE_TABLET | Freq: Two times a day (BID) | ORAL | Status: DC
Start: 2022-06-05 — End: 2022-06-05
  Administered 2022-06-05: 40 meq via ORAL
  Filled 2022-06-05 (×2): qty 2

## 2022-06-05 NOTE — Progress Notes (Signed)
Speech Language Pathology Treatment: Dysphagia  Patient Details Name: Natalie West MRN: [id number] DOB: 1931-02-16 Today's Date: 06/05/2022 Time: 1975-8832 SLP Time Calculation (min) (ACUTE ONLY): 25 min  Assessment / Plan / Recommendation Clinical Impression  Pt seen at bedside for skilled ST intervention targeting goals for diet tolerance. Pt was awake and alert, seated upright in bed. No family or visitor present. Pt accepted trials of ice chips and water via cup and straw. Pt was noted to take small individual sips. No overt s/s aspiration observed during multiple trials of water. Recommend advancing diet to dys3 with thin liquids. Continue to recommend intermittent assistance during meals for recall of safe swallow strategies. RN and MD informed. SLP will continue to follow to assess tolerance of advanced diet. Safe swallow precautions posted at [facility], whiteboard updated.   HPI HPI: 89/F with history of chronic diastolic CHF, history of PE, chronic A-fib on Eliquis, hypertension, CKD 3, suspected COPD, cognitive deficits, frailty-wheelchair/bedbound, multiple recent hospitalizations was brought to the ED with decreased responsiveness and dyspnea  -Recently hospitalized with healthcare associated pneumonia, this is her fourth hospitalization in 2 months, currently lives at home with caregivers. Pt seen on 7/3 by SLP who observed coughign with thin liquids. MBS completed, pt had normal swallowing function except for prlonned mastication. There was no coughing on MBS and no coughing in f/u visit. Pt had EGD in 2018 that showed a stricture. Esophageal sweep on 7/3 appeared WNL though no radiologist present to confirm.      SLP Plan  Continue with current plan of care      Recommendations for follow up therapy are one component of a multi-disciplinary discharge planning process, led by the attending physician.  Recommendations may be updated based on patient status, additional functional criteria and  insurance authorization.    Recommendations  Diet recommendations: Dysphagia 3 (mechanical soft);Thin liquid Liquids provided via: Cup;Straw Medication Administration: Crushed with puree Supervision: Patient able to self feed;Intermittent supervision to cue for compensatory strategies Compensations: Slow rate;Small sips/bites;Minimize environmental distractions Postural Changes and/or Swallow Maneuvers: Seated upright 90 degrees                Oral Care Recommendations: Oral care BID Follow Up Recommendations: Follow physician's recommendations for discharge plan and follow up therapies Assistance recommended at discharge: Intermittent Supervision/Assistance SLP Visit Diagnosis: Dysphagia, unspecified (R13.10) Plan: Continue with current plan of care          Natalie West, [facility], [place] Speech Language Pathologist Office: [phone]  Shonna Chock 06/05/2022, 12:21 PM

## 2022-06-05 NOTE — TOC Progression Note (Addendum)
Transition of Care [facility]) - Progression Note    Patient Details  Name: Natalie West MRN: [id number] Date of Birth: 06-07-1931  Transition [facility]) CM/SW [place], LCSW Phone Number: 06/05/2022, 9:47 AM  Clinical Narrative:    9:47am-CSW left voicemail for [facility] with [place] to ensure she received referral.   10:30am-Transitions stated patient does not qualify for their GIP level of care and they do not offer residential. [facility] assisting in contacting [facility] in [place] (near [place] where son lives) [phone]. She faxed them referral to see if patient is more appropriate for their residential level of care. CSW updated patient's daughter in law, Natalie West.    Expected Discharge Plan: [place] Barriers to Discharge: Continued Medical Work up  Expected Discharge Plan and Services Expected Discharge Plan: [place]                                               Social Determinants of Health (SDOH) Interventions    Readmission Risk Interventions    05/19/2022    5:16 PM 04/16/2022   12:20 PM  Readmission Risk Prevention Plan  Transportation Screening Complete Complete  PCP or Specialist Appt within 5-7 Days  Complete  PCP or Specialist Appt within 3-5 Days Complete   Home Care Screening  Complete  Medication Review (RN CM)  Complete  HRI or Home Care Consult Complete   Social Work Consult for [place] Planning/Counseling Complete   Palliative Care Screening Not Applicable   Medication Review Press photographer) Referral to Pharmacy

## 2022-06-05 NOTE — Progress Notes (Signed)
PROGRESS NOTE    Natalie West  ESP:[id number] DOB: 1931-05-13 DOA: 06/02/2022 PCP: Christain Sacramento, MD  [name] with history of chronic diastolic CHF, history of PE, chronic A-fib on Eliquis, hypertension, CKD 3, suspected COPD, cognitive deficits, frailty-wheelchair/bedbound, multiple recent hospitalizations was brought to the ED with decreased responsiveness and dyspnea -Recently hospitalized with healthcare associated pneumonia, this is her fourth hospitalization in 2 months, currently lives at home with caregivers  Subjective: -Feels better, remains pleasantly confused, reports that she has invited her friends and planning to cook a big meal for them today  Assessment and Plan:  Acute respiratory failure with hypoxia ([place]) -Required BiPAP on admission, now off -Clinically suspect diastolic CHF, and COPD exacerbation as contributing -CTA chest on admission no PE, bibasilar atelectasis and small pleural effusions -Diuresed with IV Lasix, now appears euvolemic, switch to p.o. Lasix today, continue Toprol and Jardiance, discontinue antibiotics -Now on prednisone taper, continue DuoNebs and pulmonary toilet -appreciate SLP eval, dysphagia with moderate aspiration risk noted-D3 with nectar thick liquids recommended  ETHICS: [name] with frequent hospitalizations, fourth admission in 2 months, CHF, COPD, recurrent pneumonia, cognitive deficits, failure to thrive, bedbound -Discussed poor prognosis with nephew Belenda Cruise, appreciate palliative care input, hospice being considered, if residential hospice is not an option, home with hospice may need to be considered  Acute on chronic diastolic CHF -As above  COPD exacerbation -Non-smoker, exposed to secondhand smoking for 40+ years -IV steroids, switch to prednisone taper, continue DuoNebs as noted above  Essential hypertension -Stable, monitor with diuretics  History of DVT/pulmonary embolism Continue Eliquis.  Candidiasis of breast Nystatin  ointment BID  Chronic atrial fibrillation with RVR ([place]) -Heart rate improved, continue Toprol, continue Eliquis  Severe protein calorie malnutrition  Frailty, wheelchair/bedbound  Hypokalemia -Replaced  DVT prophylaxis: Eliquis Code Status: DNR Family Communication: No family at bedside, called and updated nephew/POA Belenda Cruise 8/2 Disposition Plan: Hospice anticipated  Consultants: Palliative care   Procedures:   Antimicrobials:    Objective: Vitals:   06/05/22 0000 06/05/22 0314 06/05/22 0400 06/05/22 0800  BP: 127/78 138/82 (!) 126/95 (!) 128/93  Pulse: 83 83  72  Resp: '20 17 20 13  '$ Temp: 98.1 F (36.7 C) 98.5 F (36.9 C)  98 F (36.7 C)  TempSrc: Oral Oral  Oral  SpO2: 100%   95%  Weight:      Height:        Intake/Output Summary (Last 24 hours) at 06/05/2022 1207 Last data filed at 06/05/2022 1000 Gross per 24 hour  Intake 120 ml  Output 900 ml  Net -780 ml   Filed Weights   06/02/22 2046  Weight: 53.6 kg    Examination:  General exam: Elderly frail chronically ill female sitting up in bed, awake alert oriented to self and partly to place, moderate cognitive deficits CVS: S1-S2, regular rhythm Lungs: Improving air movement, few scattered rails Abdomen: Soft, nontender, bowel sounds present  Extremities: No edema  Psych: Poor insight and judgment   Data Reviewed:   CBC: Recent Labs  Lab 06/02/22 2048 06/02/22 2214 06/03/22 0342 06/04/22 0451  WBC 16.8*  --  14.0* 14.6*  NEUTROABS 12.7*  --   --   --   HGB 12.5 12.6 11.7* 12.4  HCT 39.7 37.0 37.4 38.3  MCV 108.8*  --  107.8* 105.8*  PLT 231  --  177 633   Basic Metabolic Panel: Recent Labs  Lab 06/02/22 2048 06/02/22 2214 06/03/22 0342 06/04/22 0451 06/05/22 0516  NA 140  142 139 137 139  K 4.7 4.0 4.3 4.2 3.0*  CL 114*  --  112* 108 105  CO2 22  --  22 21* 26  GLUCOSE 225*  --  163* 134* 119*  BUN 22  --  20 26* 34*  CREATININE 0.99  --  0.87 0.90 1.13*  CALCIUM 8.3*  --  8.3*  8.2* 8.2*   GFR: Estimated Creatinine Clearance: 27.2 mL/min (A) (by C-G formula based on SCr of 1.13 mg/dL (H)). Liver Function Tests: Recent Labs  Lab 06/02/22 2048  AST 23  ALT 13  ALKPHOS 51  BILITOT 0.5  PROT 6.0*  ALBUMIN 3.2*   Recent Labs  Lab 06/02/22 2048  LIPASE 29   No results for input(s): "AMMONIA" in the last 168 hours. Coagulation Profile: No results for input(s): "INR", "PROTIME" in the last 168 hours. Cardiac Enzymes: No results for input(s): "CKTOTAL", "CKMB", "CKMBINDEX", "TROPONINI" in the last 168 hours. BNP (last 3 results) No results for input(s): "PROBNP" in the last 8760 hours. HbA1C: No results for input(s): "HGBA1C" in the last 72 hours. CBG: No results for input(s): "GLUCAP" in the last 168 hours. Lipid Profile: No results for input(s): "CHOL", "HDL", "LDLCALC", "TRIG", "CHOLHDL", "LDLDIRECT" in the last 72 hours. Thyroid Function Tests: No results for input(s): "TSH", "T4TOTAL", "FREET4", "T3FREE", "THYROIDAB" in the last 72 hours. Anemia Panel: No results for input(s): "VITAMINB12", "FOLATE", "FERRITIN", "TIBC", "IRON", "RETICCTPCT" in the last 72 hours. Urine analysis:    Component Value Date/Time   COLORURINE YELLOW 06/03/2022 0600   APPEARANCEUR HAZY (A) 06/03/2022 0600   LABSPEC 1.037 (H) 06/03/2022 0600   PHURINE 5.0 06/03/2022 0600   GLUCOSEU >=500 (A) 06/03/2022 0600   HGBUR NEGATIVE 06/03/2022 0600   BILIRUBINUR NEGATIVE 06/03/2022 0600   BILIRUBINUR n 02/18/2015 1404   KETONESUR NEGATIVE 06/03/2022 0600   PROTEINUR NEGATIVE 06/03/2022 0600   UROBILINOGEN negative 02/18/2015 1404   UROBILINOGEN 0.2 02/28/2013 0739   NITRITE NEGATIVE 06/03/2022 0600   LEUKOCYTESUR TRACE (A) 06/03/2022 0600   Sepsis Labs: '@LABRCNTIP'$ (procalcitonin:4,lacticidven:4)  ) Recent Results (from the past 240 hour(s))  SARS Coronavirus 2 by RT PCR (hospital order, performed in [place] hospital lab) *cepheid single result test* Anterior Nasal  Swab     Status: None   Collection Time: 06/02/22  8:48 PM   Specimen: Anterior Nasal Swab  Result Value Ref Range Status   SARS Coronavirus 2 by RT PCR NEGATIVE NEGATIVE Final    Comment: (NOTE) SARS-CoV-2 target nucleic acids are NOT DETECTED.  The SARS-CoV-2 RNA is generally detectable in upper and lower respiratory specimens during the acute phase of infection. The lowest concentration of SARS-CoV-2 viral copies this assay can detect is 250 copies / mL. A negative result does not preclude SARS-CoV-2 infection and should not be used as the sole basis for treatment or other patient management decisions.  A negative result may occur with improper specimen collection / handling, submission of specimen other than nasopharyngeal swab, presence of viral mutation(s) within the areas targeted by this assay, and inadequate number of viral copies (<250 copies / mL). A negative result must be combined with clinical observations, patient history, and epidemiological information.  Fact Sheet for Patients:   https://www.patel.info/  Fact Sheet for Healthcare Providers: https://hall.com/  This test is not yet approved or  cleared by the [place] FDA and has been authorized for detection and/or diagnosis of SARS-CoV-2 by FDA under an Emergency Use Authorization (EUA).  This EUA will remain in effect (  meaning this test can be used) for the duration of the COVID-19 declaration under Section 564(b)(1) of the Act, 21 U.S.C. section 360bbb-3(b)(1), unless the authorization is terminated or revoked sooner.  Performed at [facility], [place] [address] [place], [place] [address]   Blood culture (routine x 2)     Status: None (Preliminary result)   Collection Time: 06/02/22 11:22 PM   Specimen: BLOOD RIGHT FOREARM  Result Value Ref Range Status   Specimen Description BLOOD RIGHT FOREARM  Final   Special Requests   Final    BOTTLES DRAWN AEROBIC  AND ANAEROBIC Blood Culture results may not be optimal due to an inadequate volume of blood received in culture bottles   Culture   Final    NO GROWTH 3 DAYS Performed at [facility], [place] [address], [place], [place] [address]    Report Status PENDING  Incomplete  Blood culture (routine x 2)     Status: None (Preliminary result)   Collection Time: 06/03/22 12:07 AM   Specimen: BLOOD  Result Value Ref Range Status   Specimen Description BLOOD SITE NOT SPECIFIED  Final   Special Requests   Final    BOTTLES DRAWN AEROBIC AND ANAEROBIC Blood Culture adequate volume   Culture   Final    NO GROWTH 2 DAYS Performed at [facility], [place] [address], [place], [place] [address]    Report Status PENDING  Incomplete  MRSA Next Gen by PCR, Nasal     Status: None   Collection Time: 06/03/22 12:07 AM   Specimen: Nasal Mucosa; Nasal Swab  Result Value Ref Range Status   MRSA by PCR Next Gen NOT DETECTED NOT DETECTED Final    Comment: (NOTE) The GeneXpert MRSA Assay (FDA approved for NASAL specimens only), is one component of a comprehensive MRSA colonization surveillance program. It is not intended to diagnose MRSA infection nor to guide or monitor treatment for MRSA infections. Test performance is not FDA approved in patients less than 65 years old. Performed at [facility], [place] [address] [place], [place] [address]      Radiology Studies: No results found.   Scheduled Meds:  amitriptyline  25 mg Oral QHS   apixaban  5 mg Oral BID   empagliflozin  10 mg Oral Daily   leptospermum manuka honey  1 Application Topical Daily   metoprolol tartrate  25 mg Oral BID   mirtazapine  15 mg Oral QHS   nystatin   Topical BID   pantoprazole  40 mg Oral Q1200   potassium chloride  40 mEq Oral BID   pravastatin  20 mg Oral q1800   predniSONE  40 mg Oral Q breakfast   sertraline  25 mg Oral Daily   Continuous Infusions:     LOS: 3 days    Time spent: 72mn    PDomenic Polite MD [facility]   06/05/2022, 12:07 PM

## 2022-06-05 NOTE — Care Management Important Message (Signed)
Important Message  Patient Details  Name: Natalie West MRN: [id number] Date of Birth: April 15, 1931   Medicare Important Message Given:  Yes     Orbie Pyo 06/05/2022, 2:33 PM

## 2022-06-05 NOTE — Plan of Care (Signed)
Pt is a turn q 2 hrs from side to side. BLE up on pillows. Pt is pleasantly confused and not able to reorient.

## 2022-06-06 DIAGNOSIS — J9601 Acute respiratory failure with hypoxia: Secondary | ICD-10-CM | POA: Diagnosis not present

## 2022-06-06 LAB — BASIC METABOLIC PANEL
Anion gap: 10 (ref 5–15)
BUN: 35 mg/dL — ABNORMAL HIGH (ref 8–23)
CO2: 24 mmol/L (ref 22–32)
Calcium: 8.5 mg/dL — ABNORMAL LOW (ref 8.9–10.3)
Chloride: 104 mmol/L (ref 98–111)
Creatinine, Ser: 0.84 mg/dL (ref 0.44–1.00)
GFR, Estimated: >60 mL/min (ref >60)
Glucose, Bld: 104 mg/dL — ABNORMAL HIGH (ref 70–99)
Potassium: 3.9 mmol/L (ref 3.5–5.1)
Sodium: 138 mmol/L (ref 135–145)

## 2022-06-06 NOTE — Progress Notes (Signed)
Speech Language Pathology Treatment: Dysphagia  Patient Details Name: Natalie West MRN: [id number] DOB: [birth year] Today's Date: 06/06/2022 Time: 6568-1275 SLP Time Calculation (min) (ACUTE ONLY): 23 min  Assessment / Plan / Recommendation Clinical Impression  Pt partially reclined in bed upon SLP arrival, RN at bedside assisting pt with am meal. With RN assist, repositioned pt upright in bed and educated both pt and RN regarding importance of upright positioning to decrease risk for aspiration. Pt self-fed over 8oz of thin liquid by straw, demonstrating x2 instances of overt coughing. Question impact of environmental distractions vs volume of bolus. With min verbal/visual cues, pt took small sips by straw, one at a time with 90% accuracy across trials. Mastication of dys 3 solids was prolonged, but appeared functional given pt was in no apparent fatigue and she was able to orally clear with time. Recommend continue dys 3 diet/ thin liquids with adherence to universal swallow precautions, ensuring pt is in proper upright positioning for all intake. Will continue f/u for tolerance and independence with use of strategies.    HPI HPI: 89/F with history of chronic diastolic CHF, history of PE, chronic A-fib on Eliquis, hypertension, CKD 3, suspected COPD, cognitive deficits, frailty-wheelchair/bedbound, multiple recent hospitalizations was brought to the ED with decreased responsiveness and dyspnea  -Recently hospitalized with healthcare associated pneumonia, this is her fourth hospitalization in 2 months, currently lives at home with caregivers. Pt seen on 7/3 by SLP who observed coughign with thin liquids. MBS completed, pt had normal swallowing function except for prlonned mastication. There was no coughing on MBS and no coughing in f/u visit. Pt had EGD in 2018 that showed a stricture. Esophageal sweep on 7/3 appeared WNL though no radiologist present to confirm.      SLP Plan  Continue with current plan  of care      Recommendations for follow up therapy are one component of a multi-disciplinary discharge planning process, led by the attending physician.  Recommendations may be updated based on patient status, additional functional criteria and insurance authorization.    Recommendations  Diet recommendations: Dysphagia 3 (mechanical soft);Thin liquid Liquids provided via: Cup;Straw Medication Administration: Crushed with puree Supervision: Patient able to self feed;Intermittent supervision to cue for compensatory strategies Compensations: Slow rate;Small sips/bites;Minimize environmental distractions Postural Changes and/or Swallow Maneuvers: Seated upright 90 degrees                Oral Care Recommendations: Oral care BID Follow Up Recommendations: Follow physician's recommendations for discharge plan and follow up therapies Assistance recommended at discharge: Intermittent Supervision/Assistance SLP Visit Diagnosis: Dysphagia, unspecified (R13.10) Plan: Continue with current plan of care            Ellwood Dense, [place], [place] Office Number: [place]  06/06/2022, 10:57 AM

## 2022-06-06 NOTE — Progress Notes (Signed)
PROGRESS NOTE    Natalie West  JOA:[id number] DOB: September 19, 1931 DOA: 06/02/2022 PCP: Christain Sacramento, MD  [name] with history of chronic diastolic CHF, history of PE, chronic A-fib on Eliquis, hypertension, CKD 3, suspected COPD, cognitive deficits, frailty-wheelchair/bedbound, multiple recent hospitalizations was brought to the ED with decreased responsiveness and dyspnea -Recently hospitalized with healthcare associated pneumonia, this is her fourth hospitalization in 2 months, currently lives at home with caregivers  Subjective: -Feels okay overall, pleasantly confused, no events overnight  Assessment and Plan:  Acute respiratory failure with hypoxia ([place]) -Required BiPAP on admission, now off -Clinically suspect diastolic CHF, and COPD exacerbation as contributing -CTA chest on admission no PE, bibasilar atelectasis and small pleural effusions -Diuresed with IV Lasix briefly, now euvolemic, continue oral Lasix Toprol and Jardiance, -Now on prednisone taper, continue DuoNebs and pulmonary toilet -appreciate SLP eval, dysphagia with moderate aspiration risk noted-D3 thin liquids recommended  ETHICS: [name] with frequent hospitalizations, fourth admission in 2 months, CHF, COPD, recurrent pneumonia, cognitive deficits, failure to thrive, bedbound -Discussed poor prognosis with nephew Belenda Cruise, appreciate palliative care input, plan for hospice, if residential hospice is not an option, home with hospice may need to be considered, TOC following  Acute on chronic diastolic CHF -As above  COPD exacerbation -Non-smoker, exposed to secondhand smoking for 40+ years -IV steroids, switch to prednisone taper, continue DuoNebs as noted above  Essential hypertension -Stable, monitor with diuretics  History of DVT/pulmonary embolism Continue Eliquis.  Candidiasis of breast Nystatin ointment BID  Chronic atrial fibrillation with RVR ([place]) -Heart rate improved, continue Toprol, continue  Eliquis  Severe protein calorie malnutrition  Frailty, wheelchair/bedbound  Hypokalemia -Replaced  DVT prophylaxis: Eliquis Code Status: DNR Family Communication: No family at bedside, called and updated nephew/POA Belenda Cruise 8/2 Disposition Plan: Hospice anticipated  Consultants: Palliative care   Procedures:   Antimicrobials:    Objective: Vitals:   06/05/22 2319 06/06/22 0000 06/06/22 0426 06/06/22 0800  BP: 129/85 116/79 120/86 126/78  Pulse: 78 81 70 74  Resp: 19 20 (!) 23 (!) 24  Temp: 98 F (36.7 C)     TempSrc: Oral     SpO2: 98% 92% 95% 94%  Weight:      Height:        Intake/Output Summary (Last 24 hours) at 06/06/2022 1135 Last data filed at 06/06/2022 1105 Gross per 24 hour  Intake --  Output 650 ml  Net -650 ml   Filed Weights   06/02/22 2046  Weight: 53.6 kg    Examination:  General exam: Elderly frail chronically ill female sitting up in bed, awake alert oriented to self and partly to place, moderate cognitive deficits CVS: S1-S2, regular rhythm Lungs: Improving air movement, few scattered rails Abdomen: Soft, nontender, bowel sounds present  Extremities: No edema  Psych: Poor insight and judgment   Data Reviewed:   CBC: Recent Labs  Lab 06/02/22 2048 06/02/22 2214 06/03/22 0342 06/04/22 0451  WBC 16.8*  --  14.0* 14.6*  NEUTROABS 12.7*  --   --   --   HGB 12.5 12.6 11.7* 12.4  HCT 39.7 37.0 37.4 38.3  MCV 108.8*  --  107.8* 105.8*  PLT 231  --  177 601   Basic Metabolic Panel: Recent Labs  Lab 06/02/22 2048 06/02/22 2214 06/03/22 0342 06/04/22 0451 06/05/22 0516 06/06/22 0303  NA 140 142 139 137 139 138  K 4.7 4.0 4.3 4.2 3.0* 3.9  CL 114*  --  112* 108 105 104  CO2 22  --  22 21* 26 24  GLUCOSE 225*  --  163* 134* 119* 104*  BUN 22  --  20 26* 34* 35*  CREATININE 0.99  --  0.87 0.90 1.13* 0.84  CALCIUM 8.3*  --  8.3* 8.2* 8.2* 8.5*   GFR: Estimated Creatinine Clearance: 36.6 mL/min (by C-G formula based on SCr of  0.84 mg/dL). Liver Function Tests: Recent Labs  Lab 06/02/22 2048  AST 23  ALT 13  ALKPHOS 51  BILITOT 0.5  PROT 6.0*  ALBUMIN 3.2*   Recent Labs  Lab 06/02/22 2048  LIPASE 29   No results for input(s): "AMMONIA" in the last 168 hours. Coagulation Profile: No results for input(s): "INR", "PROTIME" in the last 168 hours. Cardiac Enzymes: No results for input(s): "CKTOTAL", "CKMB", "CKMBINDEX", "TROPONINI" in the last 168 hours. BNP (last 3 results) No results for input(s): "PROBNP" in the last 8760 hours. HbA1C: No results for input(s): "HGBA1C" in the last 72 hours. CBG: No results for input(s): "GLUCAP" in the last 168 hours. Lipid Profile: No results for input(s): "CHOL", "HDL", "LDLCALC", "TRIG", "CHOLHDL", "LDLDIRECT" in the last 72 hours. Thyroid Function Tests: No results for input(s): "TSH", "T4TOTAL", "FREET4", "T3FREE", "THYROIDAB" in the last 72 hours. Anemia Panel: No results for input(s): "VITAMINB12", "FOLATE", "FERRITIN", "TIBC", "IRON", "RETICCTPCT" in the last 72 hours. Urine analysis:    Component Value Date/Time   COLORURINE YELLOW 06/03/2022 0600   APPEARANCEUR HAZY (A) 06/03/2022 0600   LABSPEC 1.037 (H) 06/03/2022 0600   PHURINE 5.0 06/03/2022 0600   GLUCOSEU >=500 (A) 06/03/2022 0600   HGBUR NEGATIVE 06/03/2022 0600   BILIRUBINUR NEGATIVE 06/03/2022 0600   BILIRUBINUR n 02/18/2015 1404   KETONESUR NEGATIVE 06/03/2022 0600   PROTEINUR NEGATIVE 06/03/2022 0600   UROBILINOGEN negative 02/18/2015 1404   UROBILINOGEN 0.2 02/28/2013 0739   NITRITE NEGATIVE 06/03/2022 0600   LEUKOCYTESUR TRACE (A) 06/03/2022 0600   Sepsis Labs: '@LABRCNTIP'$ (procalcitonin:4,lacticidven:4)  ) Recent Results (from the past 240 hour(s))  SARS Coronavirus 2 by RT PCR (hospital order, performed in [place] hospital lab) *cepheid single result test* Anterior Nasal Swab     Status: None   Collection Time: 06/02/22  8:48 PM   Specimen: Anterior Nasal Swab  Result  Value Ref Range Status   SARS Coronavirus 2 by RT PCR NEGATIVE NEGATIVE Final    Comment: (NOTE) SARS-CoV-2 target nucleic acids are NOT DETECTED.  The SARS-CoV-2 RNA is generally detectable in upper and lower respiratory specimens during the acute phase of infection. The lowest concentration of SARS-CoV-2 viral copies this assay can detect is 250 copies / mL. A negative result does not preclude SARS-CoV-2 infection and should not be used as the sole basis for treatment or other patient management decisions.  A negative result may occur with improper specimen collection / handling, submission of specimen other than nasopharyngeal swab, presence of viral mutation(s) within the areas targeted by this assay, and inadequate number of viral copies (<250 copies / mL). A negative result must be combined with clinical observations, patient history, and epidemiological information.  Fact Sheet for Patients:   https://www.patel.info/  Fact Sheet for Healthcare Providers: https://hall.com/  This test is not yet approved or  cleared by the [place] FDA and has been authorized for detection and/or diagnosis of SARS-CoV-2 by FDA under an Emergency Use Authorization (EUA).  This EUA will remain in effect (meaning this test can be used) for the duration of the COVID-19 declaration under Section 564(b)(1) of the  Act, 21 U.S.C. section 360bbb-3(b)(1), unless the authorization is terminated or revoked sooner.  Performed at [facility], [place] [address] [place], [place] [address]   Blood culture (routine x 2)     Status: None (Preliminary result)   Collection Time: 06/02/22 11:22 PM   Specimen: BLOOD RIGHT FOREARM  Result Value Ref Range Status   Specimen Description BLOOD RIGHT FOREARM  Final   Special Requests   Final    BOTTLES DRAWN AEROBIC AND ANAEROBIC Blood Culture results may not be optimal due to an inadequate volume of blood received  in culture bottles   Culture   Final    NO GROWTH 4 DAYS Performed at [facility], [place] [address], [place], [place] [address]    Report Status PENDING  Incomplete  Blood culture (routine x 2)     Status: None (Preliminary result)   Collection Time: 06/03/22 12:07 AM   Specimen: BLOOD  Result Value Ref Range Status   Specimen Description BLOOD SITE NOT SPECIFIED  Final   Special Requests   Final    BOTTLES DRAWN AEROBIC AND ANAEROBIC Blood Culture adequate volume   Culture   Final    NO GROWTH 3 DAYS Performed at [facility], [address], [place], [place] [address]    Report Status PENDING  Incomplete  MRSA Next Gen by PCR, Nasal     Status: None   Collection Time: 06/03/22 12:07 AM   Specimen: Nasal Mucosa; Nasal Swab  Result Value Ref Range Status   MRSA by PCR Next Gen NOT DETECTED NOT DETECTED Final    Comment: (NOTE) The GeneXpert MRSA Assay (FDA approved for NASAL specimens only), is one component of a comprehensive MRSA colonization surveillance program. It is not intended to diagnose MRSA infection nor to guide or monitor treatment for MRSA infections. Test performance is not FDA approved in patients less than 68 years old. Performed at [facility], [place] [address] [place], [place] [address]      Radiology Studies: No results found.   Scheduled Meds:  amitriptyline  25 mg Oral QHS   apixaban  5 mg Oral BID   empagliflozin  10 mg Oral Daily   furosemide  20 mg Oral Daily   leptospermum manuka honey  1 Application Topical Daily   metoprolol tartrate  25 mg Oral BID   mirtazapine  15 mg Oral QHS   nystatin   Topical BID   pantoprazole  40 mg Oral Q1200   potassium chloride  40 mEq Oral BID   pravastatin  20 mg Oral q1800   predniSONE  20 mg Oral Q breakfast   sertraline  25 mg Oral Daily   Continuous Infusions:     LOS: 4 days    Time spent: 83mn    PDomenic Polite MD [facility]   06/06/2022, 11:35 AM

## 2022-06-06 NOTE — TOC Progression Note (Signed)
Transition of Care [facility]) - Progression Note    Patient Details  Name: Natalie West MRN: [id number] Date of Birth: Feb 26, 1931  Transition of Care [facility]) CM/SW Contact  Ina Homes, [place] Phone Number: 06/06/2022, 10:20 AM  Clinical Narrative:     SW spoke with [facility]) reports Dr. Truddie Crumble is reviewing, requested SW callback in AM.    Expected Discharge Plan: [place] Barriers to Discharge: Continued Medical Work up  Expected Discharge Plan and Services Expected Discharge Plan: [place]                                               Social Determinants of Health (SDOH) Interventions    Readmission Risk Interventions    05/19/2022    5:16 PM 04/16/2022   12:20 PM  Readmission Risk Prevention Plan  Transportation Screening Complete Complete  PCP or Specialist Appt within 5-7 Days  Complete  PCP or Specialist Appt within 3-5 Days Complete   Home Care Screening  Complete  Medication Review (RN CM)  Complete  HRI or Home Care Consult Complete   Social Work Consult for [place] Planning/Counseling Complete   Palliative Care Screening Not Applicable   Medication Review (RN Care Manager) Referral to Pharmacy

## 2022-06-07 LAB — CULTURE, BLOOD (ROUTINE X 2): Culture: NO GROWTH

## 2022-06-07 NOTE — Plan of Care (Signed)
  Problem: Clinical Measurements: Goal: Diagnostic test results will improve Outcome: Progressing Goal: Respiratory complications will improve Outcome: Progressing   Problem: Nutrition: Goal: Adequate nutrition will be maintained Outcome: Progressing   

## 2022-06-07 NOTE — Progress Notes (Signed)
Patient seen and examined, no changes from my note yesterday -Remains stable, awaiting residential hospice -TOC following  Domenic Polite, MD

## 2022-06-08 DIAGNOSIS — J9601 Acute respiratory failure with hypoxia: Secondary | ICD-10-CM | POA: Diagnosis not present

## 2022-06-08 LAB — CULTURE, BLOOD (ROUTINE X 2)
Culture: NO GROWTH
Special Requests: ADEQUATE

## 2022-06-08 MED ORDER — POTASSIUM CHLORIDE 20 MEQ PO PACK: 20.0000 meq | PACK | Freq: Every day | ORAL | Status: DC

## 2022-06-08 MED ORDER — PREDNISONE 20 MG PO TABS: 20.0000 mg | ORAL_TABLET | Freq: Every day | ORAL | 0 refills | Status: AC

## 2022-06-08 MED ORDER — FUROSEMIDE 20 MG PO TABS: 20.0000 mg | ORAL_TABLET | Freq: Every day | ORAL | Status: DC

## 2022-06-08 NOTE — Discharge Summary (Signed)
Physician Discharge Summary  ANNAH JASKO NID:[id number] DOB: Jun 06, 1931 DOA: 06/02/2022  PCP: Christain [place], MD  Admit date: 06/02/2022 Discharge date: 06/08/2022  Time spent: 35 minutes  Recommendations for Outpatient Follow-up:  Hospice for comfort focused care   Discharge Diagnoses:  Principal Problem:   Acute respiratory failure with hypoxia ([place]) Acute on chronic diastolic CHF COPD exacerbation Dysphagia History of recurrent pneumonia History of DVT/PE Frailty Severe malnutrition   Essential hypertension   History of pulmonary embolism   Chronic atrial fibrillation with RVR ([place])   Candidiasis of breast   Discharge Condition: Stable  Diet recommendation: Comfort focused care  [facility] Weights   06/02/22 2046  Weight: 53.6 kg    History of present illness:  89/F with history of chronic diastolic CHF, history of PE, chronic A-fib on Eliquis, hypertension, CKD 3, suspected COPD, cognitive deficits, frailty-wheelchair/bedbound, multiple recent hospitalizations was brought to the ED with decreased responsiveness and dyspnea -Recently hospitalized with healthcare associated pneumonia, this is her fourth hospitalization in 2 months, currently lives at home with caregivers  Hospital Course:   Acute respiratory failure with hypoxia ([place]) -Required BiPAP on admission, now off -Clinically suspect diastolic CHF, and COPD exacerbation as contributing -CTA chest on admission no PE, bibasilar atelectasis and small pleural effusions -Diuresed with IV Lasix briefly, now euvolemic, continue oral Lasix, Toprol and Jardiance -Now on prednisone taper, continue DuoNebs and pulmonary toilet -appreciate SLP eval, dysphagia with moderate aspiration risk noted-D3 thin liquids recommended   ETHICS: 89/F with frequent hospitalizations, fourth admission in 2 months, CHF, COPD, recurrent pneumonia, cognitive deficits/early dementia, failure to thrive, bedbound -Discussed poor prognosis with nephew  Belenda Cruise, palliative care consulted, s/p family meeting, plan for residential hospice   Acute on chronic diastolic CHF -As above   COPD exacerbation -Non-smoker, exposed to secondhand smoking for 40+ years -treated with IV steroids, switched to prednisone taper, continue DuoNebs as noted above   Essential hypertension -Stable   History of DVT/pulmonary embolism Continue Eliquis.   Candidiasis of breast Nystatin ointment BID   Chronic atrial fibrillation with RVR ([facility]) -Heart rate improved, continue Toprol, continue Eliquis   Severe protein calorie malnutrition   Frailty, wheelchair/bedbound   Hypokalemia -Replaced   Consultants: Palliative care    Discharge Exam: Vitals:   06/08/22 0800 06/08/22 0900  BP: (!) 126/97   Pulse:    Resp: 16 12  Temp:    SpO2:     General exam: Elderly frail chronically ill female sitting up in bed, awake alert oriented to self and partly to place, moderate cognitive deficits CVS: S1-S2, regular rhythm Lungs: Improving air movement, few scattered rails Abdomen: Soft, nontender, bowel sounds present  Extremities: No edema  Psych: Poor insight and judgment     Discharge Instructions    Allergies as of 06/08/2022       Reactions   Cipro [ciprofloxacin Hcl] Other (See Comments)   Unknown reaction   Cleocin [clindamycin] Other (See Comments)   Unknown reaction   Lincocin [lincomycin Hcl] Other (See Comments)   Unknown reaction   Codeine Nausea And Vomiting   Flexeril [cyclobenzaprine Hcl] Other (See Comments)   Unknown reaction   Hydrocodone Other (See Comments)   "Last time it was too strong and messed up my mind"   Macrobid [nitrofurantoin] Other (See Comments)   Unknown reaction   Nalfon [fenoprofen Calcium] Other (See Comments)   Unknown reaction   Noroxin [norfloxacin] Other (See Comments)   Unknown reaction   Relafen [nabumetone] Other (See  Comments)   Unknown reaction   Sulfa Antibiotics Other (See Comments)    Unknown reaction   Voltaren [diclofenac] Other (See Comments)   Unknown reaction        Medication List     STOP taking these medications    azithromycin 250 MG tablet Commonly known as: Zithromax   benzonatate 100 MG capsule Commonly known as: TESSALON   leptospermum manuka honey Pste paste       TAKE these medications    acetaminophen 325 MG tablet Commonly known as: TYLENOL Take 2 tablets (650 mg total) by mouth every 6 (six) hours as needed. What changed: reasons to take this   albuterol (2.5 MG/3ML) 0.083% nebulizer solution Commonly known as: PROVENTIL Take 2.5 mg by nebulization 2 (two) times daily.   amitriptyline 25 MG tablet Commonly known as: ELAVIL Take 1 tablet (25 mg total) by mouth in the morning and at bedtime.   apixaban 5 MG Tabs tablet Commonly known as: ELIQUIS Take 1 tablet (5 mg total) by mouth 2 (two) times daily.   furosemide 20 MG tablet Commonly known as: LASIX Take 1 tablet (20 mg total) by mouth daily. What changed:  when to take this reasons to take this   Jardiance 10 MG Tabs tablet Generic drug: empagliflozin Take 1 tablet (10 mg total) by mouth daily.   megestrol 400 MG/10ML suspension Commonly known as: MEGACE Take 10 mLs (400 mg total) by mouth 2 (two) times daily.   metoprolol tartrate 25 MG tablet Commonly known as: LOPRESSOR Take 1 tablet (25 mg total) by mouth 2 (two) times daily.   mirtazapine 15 MG tablet Commonly known as: REMERON Take 1 tablet (15 mg total) by mouth at bedtime.   multivitamin with minerals Tabs tablet Take 1 tablet by mouth every morning. Centrum Silver for Women   pantoprazole 40 MG tablet Commonly known as: PROTONIX Take 1 tablet (40 mg total) by mouth daily. 1 Tablet Daily for Heartburn and Acid Reflux What changed:  when to take this additional instructions   polyethylene glycol 17 g packet Commonly known as: MIRALAX / GLYCOLAX Take 17 g by mouth 2 (two) times daily as needed  for mild constipation. What changed: when to take this   potassium chloride 20 MEQ packet Commonly known as: KLOR-CON Take 20 mEq by mouth daily.   pravastatin 20 MG tablet Commonly known as: PRAVACHOL Take 20 mg by mouth at bedtime.   predniSONE 20 MG tablet Commonly known as: DELTASONE Take 1 tablet (20 mg total) by mouth daily with breakfast for 4 days. Start taking on: June 09, 2022   sertraline 25 MG tablet Commonly known as: ZOLOFT Take 1 tablet (25 mg total) by mouth daily.       Allergies  Allergen Reactions   Cipro [Ciprofloxacin Hcl] Other (See Comments)    Unknown reaction   Cleocin [Clindamycin] Other (See Comments)    Unknown reaction   Lincocin [Lincomycin Hcl] Other (See Comments)    Unknown reaction   Codeine Nausea And Vomiting   Flexeril [Cyclobenzaprine Hcl] Other (See Comments)    Unknown reaction   Hydrocodone Other (See Comments)    "Last time it was too strong and messed up my mind"   Macrobid [Nitrofurantoin] Other (See Comments)    Unknown reaction   Nalfon [Fenoprofen Calcium] Other (See Comments)    Unknown reaction   Noroxin [Norfloxacin] Other (See Comments)    Unknown reaction    Relafen [Nabumetone] Other (See Comments)  Unknown reaction    Sulfa Antibiotics Other (See Comments)    Unknown reaction   Voltaren [Diclofenac] Other (See Comments)    Unknown reaction       The results of significant diagnostics from this hospitalization (including imaging, microbiology, ancillary and laboratory) are listed below for reference.    Significant Diagnostic Studies: CT Angio Chest Pulmonary Embolism (PE) W or WO Contrast  Result Date: 06/03/2022 CLINICAL DATA:  Shortness of breath EXAM: CT ANGIOGRAPHY CHEST WITH CONTRAST TECHNIQUE: Multidetector CT imaging of the chest was performed using the standard protocol during bolus administration of intravenous contrast. Multiplanar CT image reconstructions and MIPs were obtained to evaluate  the vascular anatomy. RADIATION DOSE REDUCTION: This exam was performed according to the departmental dose-optimization program which includes automated exposure control, adjustment of the mA and/or kV according to patient size and/or use of iterative reconstruction technique. CONTRAST:  62m OMNIPAQUE IOHEXOL 350 MG/ML SOLN COMPARISON:  05/16/2022 FINDINGS: Cardiovascular: Atherosclerotic calcifications of the thoracic aorta are noted. No aneurysmal dilatation is seen. No cardiac enlargement is noted. Pulmonary artery shows a normal branching pattern without intraluminal filling defect to suggest pulmonary embolism. Coronary calcifications are noted. Mediastinum/Nodes: Esophagus is within normal limits. Thoracic inlet is within normal limits. No hilar or mediastinal adenopathy is noted. Lungs/Pleura: Mosaic attenuation is noted consistent with air trapping. Emphysematous changes are seen. Basilar atelectatic changes and small effusions are noted. No focal confluent infiltrate is seen. No sizable parenchymal nodule is noted. Upper Abdomen: Visualized upper abdomen shows right renal cyst. No follow-up is recommended. No other focal abnormality is seen. Musculoskeletal: Degenerative changes of the thoracic spine are seen. Review of the MIP images confirms the above findings. IMPRESSION: No evidence of pulmonary emboli. Bibasilar atelectasis and small effusions. No other focal abnormality is noted. Electronically Signed   By: MInez CatalinaM.D.   On: 06/03/2022 01:00   DG Chest Portable 1 View  Result Date: 06/02/2022 CLINICAL DATA:  Shortness of breath. EXAM: PORTABLE CHEST 1 VIEW COMPARISON:  05/16/2022. FINDINGS: The heart is enlarged the mediastinal contour is stable. Atherosclerotic calcification of the aorta is noted. Lung volumes are low. Interstitial prominence is noted bilaterally and likely chronic. No consolidation, effusion, or pneumothorax. Surgical clips are present in the right chest wall. No acute  osseous abnormality. IMPRESSION: 1. Cardiomegaly. 2. Chronic interstitial prominence bilaterally, possible fibrotic changes. Electronically Signed   By: LBrett FairyM.D.   On: 06/02/2022 21:22   CT Angio Chest PE W and/or Wo Contrast  Addendum Date: 05/16/2022   ADDENDUM REPORT: 05/16/2022 23:23 ADDENDUM: This addendum is to add an additional findings/impression. There is skin and soft tissues thickening overlying the left posterior aspect of T8 through T10 vertebral body and spinous processes. No well-defined or peripherally enhancing collection to suggest abscess. No soft tissue gas. There is no associated bony destructive change to suggest osteomyelitis. Suspect cellulitis. Electronically Signed   By: MKeith RakeM.D.   On: 05/16/2022 23:23   Result Date: 05/16/2022 CLINICAL DATA:  Pulmonary embolism (PE) suspected, high prob Prior pulm embolism, says she may not taken her blood thinners this week. Also cough and chills with recent pneumonia. Also erythematous area on her back concerning for infection EXAM: CT ANGIOGRAPHY CHEST WITH CONTRAST TECHNIQUE: Multidetector CT imaging of the chest was performed using the standard protocol during bolus administration of intravenous contrast. Multiplanar CT image reconstructions and MIPs were obtained to evaluate the vascular anatomy. RADIATION DOSE REDUCTION: This exam was performed according to the departmental  dose-optimization program which includes automated exposure control, adjustment of the mA and/or kV according to patient size and/or use of iterative reconstruction technique. CONTRAST:  52m OMNIPAQUE IOHEXOL 350 MG/ML SOLN COMPARISON:  Radiograph earlier today. Chest CTA 2 weeks ago 05/02/2022, also 04/12/2022 FINDINGS: Cardiovascular: No filling defects in the pulmonary arteries to suggest pulmonary embolus. Dilated main right pulmonary artery at 3.8 cm, mild dilated left pulmonary artery at 2.7 cm. Aortic atherosclerosis and tortuosity. Cannot  assess for dissection given phase of contrast tailored to pulmonary arteries ses mint multi chamber cardiomegaly. There are coronary artery calcifications no pericardial effusion. Mediastinum/Nodes: Shotty and mildly enlarged mediastinal and hilar lymph nodes, largest in the subcarinal station measuring 11 mm. Decompressed esophagus. Lungs/Pleura: Again seen heterogeneous areas of ground-glass opacity and septal thickening with a slight upper lobe predominant distribution. Similar small bilateral pleural effusions to prior exam. Again seen areas of subpleural reticulation and traction bronchiectasis. There is prominent central bronchial thickening areas of mucoid impaction in the lower lobes. Upper Abdomen: No acute findings. Small cyst in the upper right kidney, needs no further follow-up. Musculoskeletal: Exaggerated thoracic kyphosis. Stable bone island within T1 vertebral body. Subacute anterior rib fractures. No acute osseous findings. Review of the MIP images confirms the above findings. IMPRESSION: 1. No pulmonary embolus. 2. Persistent heterogeneous areas of ground-glass opacity and septal thickening with a slight upper lobe predominant distribution. This is without significant interval change from exam 2 weeks ago. Differential considerations include pulmonary edema versus atypical infection or interstitial lung disease. 3. Small bilateral pleural effusions. 4. Prominent central bronchial thickening with areas of mucoid impaction in the lower lobes. 5. Cardiomegaly with coronary artery calcifications. Dilated main pulmonary artery suggesting pulmonary arterial hypertension. 6. Shotty and mildly enlarged mediastinal and hilar lymph nodes are likely reactive. Aortic Atherosclerosis (ICD10-I70.0). Electronically Signed: By: MKeith RakeM.D. On: 05/16/2022 23:14   DG Chest Port 1 View  Result Date: 05/16/2022 CLINICAL DATA:  Questionable sepsis. EXAM: PORTABLE CHEST 1 VIEW COMPARISON:  Chest radiograph  dated 05/02/2022. FINDINGS: Diffuse chronic truck coarsening. No consolidative changes. Probable trace bilateral pleural effusions and bibasilar atelectasis. No pneumothorax. Stable cardiomegaly. Atherosclerotic calcification of the aorta. No acute osseous pathology. IMPRESSION: 1. Probable trace bilateral pleural effusions and bibasilar atelectasis. 2. Stable cardiomegaly. Electronically Signed   By: AAnner CreteM.D.   On: 05/16/2022 21:49    Microbiology: Recent Results (from the past 240 hour(s))  SARS Coronavirus 2 by RT PCR (hospital order[facility] *cepheid single result test* Anterior Nasal Swab     Status: None   Collection Time: 06/02/22  8:48 PM   Specimen: Anterior Nasal Swab  Result Value Ref Range Status   SARS Coronavirus 2 by RT PCR NEGATIVE NEGATIVE Final    Comment: (NOTE) SARS-CoV-2 target nucleic acids are NOT DETECTED.  The SARS-CoV-2 RNA is generally detectable in upper and lower respiratory specimens during the acute phase of infection. The lowest concentration of SARS-CoV-2 viral copies this assay can detect is 250 copies / mL. A negative result does not preclude SARS-CoV-2 infection and should not be used as the sole basis for treatment or other patient management decisions.  A negative result may occur with improper specimen collection / handling, submission of specimen other than nasopharyngeal swab, presence of viral mutation(s) within the areas targeted by this assay, and inadequate number of viral copies (<250 copies / mL). A negative result must be combined with clinical observations, patient history, and epidemiological information.  Fact Sheet for Patients:   https://www.patel.info/  Fact Sheet for Healthcare Providers: https://hall.com/  This test is not yet approved or  cleared by the [place] FDA and has been authorized for detection and/or diagnosis of SARS-CoV-2  by FDA under an Emergency Use Authorization (EUA).  This EUA will remain in effect (meaning this test can be used) for the duration of the COVID-19 declaration under Section 564(b)(1) of the Act, 21 U.S.C. section 360bbb-3(b)(1), unless the authorization is terminated or revoked sooner.  Performed at [facility], [place] [address], [place], [place] [address]   Blood culture (routine x 2)     Status: None   Collection Time: 06/02/22 11:22 PM   Specimen: BLOOD RIGHT FOREARM  Result Value Ref Range Status   Specimen Description BLOOD RIGHT FOREARM  Final   Special Requests   Final    BOTTLES DRAWN AEROBIC AND ANAEROBIC Blood Culture results may not be optimal due to an inadequate volume of blood received in culture bottles   Culture   Final    NO GROWTH 5 DAYS Performed at [facility], [place] [address], [place], [place] [address]    Report Status 06/07/2022 FINAL  Final  Blood culture (routine x 2)     Status: None   Collection Time: 06/03/22 12:07 AM   Specimen: BLOOD  Result Value Ref Range Status   Specimen Description BLOOD SITE NOT SPECIFIED  Final   Special Requests   Final    BOTTLES DRAWN AEROBIC AND ANAEROBIC Blood Culture adequate volume   Culture   Final    NO GROWTH 5 DAYS Performed at [facility], [address] [place], [place] [address]    Report Status 06/08/2022 FINAL  Final  MRSA Next Gen by PCR, Nasal     Status: None   Collection Time: 06/03/22 12:07 AM   Specimen: Nasal Mucosa; Nasal Swab  Result Value Ref Range Status   MRSA by PCR Next Gen NOT DETECTED NOT DETECTED Final    Comment: (NOTE) The GeneXpert MRSA Assay (FDA approved for NASAL specimens only), is one component of a comprehensive MRSA colonization surveillance program. It is not intended to diagnose MRSA infection nor to guide or monitor treatment for MRSA infections. Test performance is not FDA approved in patients less than 56 years old. Performed at [facility], [place] [address], [place], [place] [address]      Labs: Basic Metabolic Panel: Recent Labs  Lab 06/02/22 2048 06/02/22 2214 06/03/22 0342 06/04/22 0451 06/05/22 0516 06/06/22 0303  NA 140 142 139 137 139 138  K 4.7 4.0 4.3 4.2 3.0* 3.9  CL 114*  --  112* 108 105 104  CO2 22  --  22 21* 26 24  GLUCOSE 225*  --  163* 134* 119* 104*  BUN 22  --  20 26* 34* 35*  CREATININE 0.99  --  0.87 0.90 1.13* 0.84  CALCIUM 8.3*  --  8.3* 8.2* 8.2* 8.5*   Liver Function Tests: Recent Labs  Lab 06/02/22 2048  AST 23  ALT 13  ALKPHOS 51  BILITOT 0.5  PROT 6.0*  ALBUMIN 3.2*   Recent Labs  Lab 06/02/22 2048  LIPASE 29   No results for input(s): "AMMONIA" in the last 168 hours. CBC: Recent Labs  Lab 06/02/22 2048 06/02/22 2214 06/03/22 0342 06/04/22 0451  WBC 16.8*  --  14.0* 14.6*  NEUTROABS 12.7*  --   --   --  HGB 12.5 12.6 11.7* 12.4  HCT 39.7 37.0 37.4 38.3  MCV 108.8*  --  107.8* 105.8*  PLT 231  --  177 171   Cardiac Enzymes: No results for input(s): "CKTOTAL", "CKMB", "CKMBINDEX", "TROPONINI" in the last 168 hours. BNP: BNP (last 3 results) Recent Labs    04/12/22 1641 05/02/22 1337 05/16/22 2051  BNP 133.1* 113.5* 148.8*    ProBNP (last 3 results) No results for input(s): "PROBNP" in the last 8760 hours.  CBG: No results for input(s): "GLUCAP" in the last 168 hours.     Signed:  Domenic Polite MD.  [facility] 06/08/2022, 1:51 PM

## 2022-06-08 NOTE — TOC Progression Note (Addendum)
Transition of Care [facility]) - Progression Note    Patient Details  Name: SOMALIA SEGLER MRN: [id number] Date of Birth: 1931/05/13  Transition of Care [facility]) CM/SW [place], LCSW Phone Number: 06/08/2022, 9:47 AM  Clinical Narrative:    9:47am-CSW spoke with Threasa Beards at [facility]. She stated she will call CSW back with referral status.   11:24am-[facility] is able to accept patient but would be a $95/day charge for room and board. CSW left voicemail for patient's nephew. His spouse, [name], has full voicemail.   12:35pm-CSW received call back from patient's nephew and provided information. He is going to discuss with his wife and call CSW back.   2pm-Greg contacted CSW back and reported agreement with [facility] facility. CSW spoke with Benjamine Mola and she will contact him for paperwork.    Expected Discharge Plan: [place] Barriers to Discharge: Continued Medical Work up  Expected Discharge Plan and Services Expected Discharge Plan: [place]                                               Social Determinants of Health (SDOH) Interventions    Readmission Risk Interventions    05/19/2022    5:16 PM 04/16/2022   12:20 PM  Readmission Risk Prevention Plan  Transportation Screening Complete Complete  PCP or Specialist Appt within 5-7 Days  Complete  PCP or Specialist Appt within 3-5 Days Complete   Home Care Screening  Complete  Medication Review (RN CM)  Complete  HRI or Home Care Consult Complete   Social Work Consult for [place] Planning/Counseling Complete   Palliative Care Screening Not Applicable   Medication Review Press photographer) Referral to Pharmacy

## 2022-06-09 NOTE — Progress Notes (Signed)
Speech Language Pathology Treatment: Dysphagia  Patient Details Name: Natalie West MRN: [id number] DOB: 02-Feb-1931 Today's Date: 06/09/2022 Time: 7282-0601 SLP Time Calculation (min) (ACUTE ONLY): 11 min  Assessment / Plan / Recommendation Clinical Impression  Upon entering she was in a semi reclined positioning and pt repositioned via therapist. She managed self feeding but needs tray set up. Mastication with bacon was prolonged and not functional. Extended mastication with eggs and potato however able to clear oral cavity without diminished endurance. She independently used liquids to assist in oral clearance. Straw sips oj and with coffee were controlled and managed without cough or throat clears. If she is in upright position recommend continue Dys 3 (chopped meats), thin liquids, tray set up and intermittent supervision to ensure oral cavity is clear. No need for further ST at this time.    HPI HPI: 89/F with history of chronic diastolic CHF, history of PE, chronic A-fib on Eliquis, hypertension, CKD 3, suspected COPD, cognitive deficits, frailty-wheelchair/bedbound, multiple recent hospitalizations was brought to the ED with decreased responsiveness and dyspnea  -Recently hospitalized with healthcare associated pneumonia, this is her fourth hospitalization in 2 months, currently lives at home with caregivers. Pt seen on 7/3 by SLP who observed coughign with thin liquids. MBS completed, pt had normal swallowing function except for prlonned mastication. There was no coughing on MBS and no coughing in f/u visit. Pt had EGD in 2018 that showed a stricture. Esophageal sweep on 7/3 appeared WNL though no radiologist present to confirm.      SLP Plan  All goals met;Discharge SLP treatment due to (comment)      Recommendations for follow up therapy are one component of a multi-disciplinary discharge planning process, led by the attending physician.  Recommendations may be updated based on patient  status, additional functional criteria and insurance authorization.    Recommendations  Diet recommendations: Dysphagia 3 (mechanical soft);Thin liquid Liquids provided via: Cup;Straw Medication Administration: Crushed with puree Supervision: Patient able to self feed;Intermittent supervision to cue for compensatory strategies (needs set up assist) Compensations: Slow rate;Small sips/bites;Lingual sweep for clearance of pocketing Postural Changes and/or Swallow Maneuvers: Seated upright 90 degrees                Oral Care Recommendations: Oral care BID Follow Up Recommendations: No SLP follow up Assistance recommended at discharge: Intermittent Supervision/Assistance SLP Visit Diagnosis: Dysphagia, unspecified (R13.10) Plan: All goals met;Discharge SLP treatment due to (comment)           Houston Siren  06/09/2022, 9:31 AM

## 2022-06-09 NOTE — Progress Notes (Signed)
No changes from my discharge summary yesterday, -Continue current care, plan for residential hospice, TOC following  Domenic Polite, MD

## 2022-06-09 NOTE — TOC Progression Note (Addendum)
Transition of Care [facility]) - Progression Note    Patient Details  Name: Natalie West MRN: [id number] Date of Birth: [birth year]  Transition of Care [facility]) CM/SW [place], LCSW Phone Number: 06/09/2022, 9:34 AM  Clinical Narrative:    9:34am-CSW contacted [facility] to get update on when they can accept patient. Per Benjamine Mola her notes state that they are waiting on patient's HCPOA to return to town. CSW reminded Benjamine Mola of our conversation yesterday afternoon that he was not returning to town and she would need to complete paperwork by email. Benjamine Mola stated she has not been involved with the case so she will have to have Melanie call CSW back as she is on the other line.    4pm-CSW reached out to Hospice again for an update. CSW spoke with Threasa Beards and she stated it looked like they were still waiting on the POA to return to town. CSW REITERATED that we ARE NOT waiting on the POA to return to town and they need to reach out to him NOW to move this process forward. She requested CSW fax additional notes and she will have their MD review again to make sure patient still qualifies and she stated Benjamine Mola will reach out to [facility].   Expected Discharge Plan: [place] Barriers to Discharge: Continued Medical Work up  Expected Discharge Plan and Services Expected Discharge Plan: [place]                                               Social Determinants of Health (SDOH) Interventions    Readmission Risk Interventions    05/19/2022    5:16 PM 04/16/2022   12:20 PM  Readmission Risk Prevention Plan  Transportation Screening Complete Complete  PCP or Specialist Appt within 5-7 Days  Complete  PCP or Specialist Appt within 3-5 Days Complete   Home Care Screening  Complete  Medication Review (RN CM)  Complete  HRI or Home Care Consult Complete   Social Work Consult for [place] Planning/Counseling Complete   Palliative Care  Screening Not Applicable   Medication Review Press photographer) Referral to Pharmacy

## 2022-06-10 MED ORDER — PANTOPRAZOLE 2 MG/ML SUSPENSION
40.0000 mg | Freq: Every day | ORAL | Status: DC
  Administered 2022-06-10 – 2022-06-13 (×4): 40 mg via ORAL
  Filled 2022-06-10 (×3): qty 20

## 2022-06-10 NOTE — TOC Progression Note (Addendum)
Transition of Care [facility]) - Progression Note    Patient Details  Name: Natalie West MRN: [id number] Date of Birth: 1930/12/23  Transition of Care [facility]) CM/SW [place], LCSW Phone Number: 06/10/2022, 1:26 PM  Clinical Narrative:    1:26pm-CSW contacted [facility]. Per Lonell Grandchild has been working on the referral and he has stepped out of the office and will call CSW back.   3pm-CSW received return call from [facility] with [facility]. He requested CSW send him today's note so they can make a "determination". CSW faxed DC summary from today.    Expected Discharge Plan: [place] Barriers to Discharge: Continued Medical Work up  Expected Discharge Plan and Services Expected Discharge Plan: [place]                                               Social Determinants of Health (SDOH) Interventions    Readmission Risk Interventions    05/19/2022    5:16 PM 04/16/2022   12:20 PM  Readmission Risk Prevention Plan  Transportation Screening Complete Complete  PCP or Specialist Appt within 5-7 Days  Complete  PCP or Specialist Appt within 3-5 Days Complete   Home Care Screening  Complete  Medication Review (RN CM)  Complete  HRI or Home Care Consult Complete   Social Work Consult for [place] Planning/Counseling Complete   Palliative Care Screening Not Applicable   Medication Review Press photographer) Referral to Pharmacy

## 2022-06-10 NOTE — Discharge Summary (Addendum)
Physician Discharge Summary  Natalie West BOF:[id number] DOB: [birth year] DOA: 06/02/2022  PCP: Christain [place], MD  Admit date: 06/02/2022 Discharge date: 06/13/2022   Addendum to discharger summary from 06/10/22.  Patient has remained stable, with no dyspnea or chest pain.  Her vital signs have been stable Her discharge was delayed due to residential hospice arrangements.   Tayson Schnelle M.D.   Time spent: 35 minutes  Recommendations for Outpatient Follow-up:  Hospice for comfort focused care   Discharge Diagnoses:  Principal Problem:   Acute respiratory failure with hypoxia ([place]) Acute on chronic diastolic CHF COPD exacerbation Dysphagia History of recurrent pneumonia History of DVT/PE Frailty Severe malnutrition   Essential hypertension   History of pulmonary embolism   Chronic atrial fibrillation with RVR (HCC)   Candidiasis of breast   Discharge Condition: Stable  Diet recommendation: Comfort focused care  [facility] Weights   06/02/22 2046  Weight: 53.6 kg    History of present illness:  [name] with history of chronic diastolic CHF, history of PE, chronic A-fib on Eliquis, hypertension, CKD 3, suspected COPD, cognitive deficits, frailty-wheelchair/bedbound, multiple recent hospitalizations was brought to the ED with decreased responsiveness and dyspnea -Recently hospitalized with healthcare associated pneumonia, this is her fourth hospitalization in 2 months, currently lives at home with caregivers  Hospital Course:   Acute respiratory failure with hypoxia ([place]) -Required BiPAP on admission, now off -Clinically suspect diastolic CHF, and COPD exacerbation as contributing -CTA chest on admission no PE, bibasilar atelectasis and small pleural effusions -Diuresed with IV Lasix briefly, now euvolemic, continue oral Lasix, Toprol and Jardiance -Now on prednisone taper, continue DuoNebs and pulmonary toilet -appreciate SLP eval, dysphagia with moderate aspiration risk  noted-D3 thin liquids recommended   ETHICS: [name] with frequent hospitalizations, fourth admission in 2 months, CHF, COPD, recurrent pneumonia, cognitive deficits/early dementia, failure to thrive, bedbound -Discussed poor prognosis with nephew Belenda Cruise, palliative care consulted, s/p family meeting, plan for residential hospice   Acute on chronic diastolic CHF -As above   COPD exacerbation -Non-smoker, exposed to secondhand smoking for 40+ years -treated with IV steroids, switched to prednisone taper, continue DuoNebs as noted above  Suspected dementia   Essential hypertension -Stable   History of DVT/pulmonary embolism Continue Eliquis.   Candidiasis of breast Nystatin ointment BID   Chronic atrial fibrillation with RVR (HCC) -Heart rate improved, continue Toprol, continue Eliquis   Severe protein calorie malnutrition   Frailty, wheelchair/bedbound   Hypokalemia -Replaced   Consultants: Palliative care    Discharge Exam: Vitals:   06/10/22 0810 06/10/22 1217  BP: 123/75 125/73  Pulse: 84 88  Resp: 20 18  Temp: 97.6 F (36.4 C) 98.7 F (37.1 C)  SpO2: 100% 96%   General exam: Elderly frail chronically ill female sitting up in bed, awake alert oriented to self and partly to place, moderate cognitive deficits CVS: S1-S2, regular rhythm Lungs: Improving air movement, few scattered rails Abdomen: Soft, nontender, bowel sounds present  Extremities: No edema  Psych: Poor insight and judgment     Discharge Instructions    Allergies as of 06/13/2022       Reactions   Cipro [ciprofloxacin Hcl] Other (See Comments)   Unknown reaction   Cleocin [clindamycin] Other (See Comments)   Unknown reaction   Lincocin [lincomycin Hcl] Other (See Comments)   Unknown reaction   Codeine Nausea And Vomiting   Flexeril [cyclobenzaprine Hcl] Other (See Comments)   Unknown reaction   Hydrocodone Other (See Comments)   "Last time  it was too strong and messed up my mind"    Macrobid [nitrofurantoin] Other (See Comments)   Unknown reaction   Nalfon [fenoprofen Calcium] Other (See Comments)   Unknown reaction   Noroxin [norfloxacin] Other (See Comments)   Unknown reaction   Relafen [nabumetone] Other (See Comments)   Unknown reaction   Sulfa Antibiotics Other (See Comments)   Unknown reaction   Voltaren [diclofenac] Other (See Comments)   Unknown reaction        Medication List     STOP taking these medications    azithromycin 250 MG tablet Commonly known as: Zithromax   benzonatate 100 MG capsule Commonly known as: TESSALON   leptospermum manuka honey Pste paste       TAKE these medications    acetaminophen 325 MG tablet Commonly known as: TYLENOL Take 2 tablets (650 mg total) by mouth every 6 (six) hours as needed. What changed: reasons to take this   albuterol (2.5 MG/3ML) 0.083% nebulizer solution Commonly known as: PROVENTIL Take 2.5 mg by nebulization 2 (two) times daily.   amitriptyline 25 MG tablet Commonly known as: ELAVIL Take 1 tablet (25 mg total) by mouth in the morning and at bedtime.   apixaban 5 MG Tabs tablet Commonly known as: ELIQUIS Take 1 tablet (5 mg total) by mouth 2 (two) times daily.   furosemide 20 MG tablet Commonly known as: LASIX Take 1 tablet (20 mg total) by mouth daily. What changed:  when to take this reasons to take this   Jardiance 10 MG Tabs tablet Generic drug: empagliflozin Take 1 tablet (10 mg total) by mouth daily.   megestrol 400 MG/10ML suspension Commonly known as: MEGACE Take 10 mLs (400 mg total) by mouth 2 (two) times daily.   metoprolol tartrate 25 MG tablet Commonly known as: LOPRESSOR Take 1 tablet (25 mg total) by mouth 2 (two) times daily.   mirtazapine 15 MG tablet Commonly known as: REMERON Take 1 tablet (15 mg total) by mouth at bedtime.   multivitamin with minerals Tabs tablet Take 1 tablet by mouth every morning. Centrum Silver for Women   pantoprazole 40  MG tablet Commonly known as: PROTONIX Take 1 tablet (40 mg total) by mouth daily. 1 Tablet Daily for Heartburn and Acid Reflux What changed:  when to take this additional instructions   polyethylene glycol 17 g packet Commonly known as: MIRALAX / GLYCOLAX Take 17 g by mouth 2 (two) times daily as needed for mild constipation. What changed: when to take this   potassium chloride 20 MEQ packet Commonly known as: KLOR-CON Take 20 mEq by mouth daily.   pravastatin 20 MG tablet Commonly known as: PRAVACHOL Take 20 mg by mouth at bedtime.   predniSONE 20 MG tablet Commonly known as: DELTASONE Take 1 tablet (20 mg total) by mouth daily with breakfast for 4 days.   sertraline 25 MG tablet Commonly known as: ZOLOFT Take 1 tablet (25 mg total) by mouth daily.               Discharge Care Instructions  (From admission, onward)           Start     Ordered   06/13/22 0000  Discharge wound care:       Comments: Vertebral wounds: Cleanse with saline, pat dry. Apply Medihoney; top with dry dressing, secure with foam. Ok to reapply medihoney daily, ok to change foam every 3 days.   06/13/22 0831   06/13/22 0000  Discharge wound  care:       Comments: Vertebral wounds: Cleanse with saline, pat dry. Apply Medihoney; top dry dressing, secure with foam. Ok to reapply medihoney daily, Ok to change foam every 3 days.   06/13/22 0834            Allergies  Allergen Reactions   Cipro [Ciprofloxacin Hcl] Other (See Comments)    Unknown reaction   Cleocin [Clindamycin] Other (See Comments)    Unknown reaction   Lincocin [Lincomycin Hcl] Other (See Comments)    Unknown reaction   Codeine Nausea And Vomiting   Flexeril [Cyclobenzaprine Hcl] Other (See Comments)    Unknown reaction   Hydrocodone Other (See Comments)    "Last time it was too strong and messed up my mind"   Macrobid [Nitrofurantoin] Other (See Comments)    Unknown reaction   Nalfon [Fenoprofen Calcium] Other  (See Comments)    Unknown reaction   Noroxin [Norfloxacin] Other (See Comments)    Unknown reaction    Relafen [Nabumetone] Other (See Comments)    Unknown reaction    Sulfa Antibiotics Other (See Comments)    Unknown reaction   Voltaren [Diclofenac] Other (See Comments)    Unknown reaction       The results of significant diagnostics from this hospitalization (including imaging, microbiology, ancillary and laboratory) are listed below for reference.    Significant Diagnostic Studies: CT Angio Chest Pulmonary Embolism (PE) W or WO Contrast  Result Date: 06/03/2022 CLINICAL DATA:  Shortness of breath EXAM: CT ANGIOGRAPHY CHEST WITH CONTRAST TECHNIQUE: Multidetector CT imaging of the chest was performed using the standard protocol during bolus administration of intravenous contrast. Multiplanar CT image reconstructions and MIPs were obtained to evaluate the vascular anatomy. RADIATION DOSE REDUCTION: This exam was performed according to the departmental dose-optimization program which includes automated exposure control, adjustment of the mA and/or kV according to patient size and/or use of iterative reconstruction technique. CONTRAST:  35m OMNIPAQUE IOHEXOL 350 MG/ML SOLN COMPARISON:  05/16/2022 FINDINGS: Cardiovascular: Atherosclerotic calcifications of the thoracic aorta are noted. No aneurysmal dilatation is seen. No cardiac enlargement is noted. Pulmonary artery shows a normal branching pattern without intraluminal filling defect to suggest pulmonary embolism. Coronary calcifications are noted. Mediastinum/Nodes: Esophagus is within normal limits. Thoracic inlet is within normal limits. No hilar or mediastinal adenopathy is noted. Lungs/Pleura: Mosaic attenuation is noted consistent with air trapping. Emphysematous changes are seen. Basilar atelectatic changes and small effusions are noted. No focal confluent infiltrate is seen. No sizable parenchymal nodule is noted. Upper Abdomen:  Visualized upper abdomen shows right renal cyst. No follow-up is recommended. No other focal abnormality is seen. Musculoskeletal: Degenerative changes of the thoracic spine are seen. Review of the MIP images confirms the above findings. IMPRESSION: No evidence of pulmonary emboli. Bibasilar atelectasis and small effusions. No other focal abnormality is noted. Electronically Signed   By: MInez CatalinaM.D.   On: 06/03/2022 01:00   DG Chest Portable 1 View  Result Date: 06/02/2022 CLINICAL DATA:  Shortness of breath. EXAM: PORTABLE CHEST 1 VIEW COMPARISON:  05/16/2022. FINDINGS: The heart is enlarged the mediastinal contour is stable. Atherosclerotic calcification of the aorta is noted. Lung volumes are low. Interstitial prominence is noted bilaterally and likely chronic. No consolidation, effusion, or pneumothorax. Surgical clips are present in the right chest wall. No acute osseous abnormality. IMPRESSION: 1. Cardiomegaly. 2. Chronic interstitial prominence bilaterally, possible fibrotic changes. Electronically Signed   By: LBrett FairyM.D.   On: 06/02/2022 21:22  CT Angio Chest PE W and/or Wo Contrast  Addendum Date: 05/16/2022   ADDENDUM REPORT: 05/16/2022 23:23 ADDENDUM: This addendum is to add an additional findings/impression. There is skin and soft tissues thickening overlying the left posterior aspect of T8 through T10 vertebral body and spinous processes. No well-defined or peripherally enhancing collection to suggest abscess. No soft tissue gas. There is no associated bony destructive change to suggest osteomyelitis. Suspect cellulitis. Electronically Signed   By: Keith Rake M.D.   On: 05/16/2022 23:23   Result Date: 05/16/2022 CLINICAL DATA:  Pulmonary embolism (PE) suspected, high prob Prior pulm embolism, says she may not taken her blood thinners this week. Also cough and chills with recent pneumonia. Also erythematous area on her back concerning for infection EXAM: CT ANGIOGRAPHY CHEST  WITH CONTRAST TECHNIQUE: Multidetector CT imaging of the chest was performed using the standard protocol during bolus administration of intravenous contrast. Multiplanar CT image reconstructions and MIPs were obtained to evaluate the vascular anatomy. RADIATION DOSE REDUCTION: This exam was performed according to the departmental dose-optimization program which includes automated exposure control, adjustment of the mA and/or kV according to patient size and/or use of iterative reconstruction technique. CONTRAST:  67m OMNIPAQUE IOHEXOL 350 MG/ML SOLN COMPARISON:  Radiograph earlier today. Chest CTA 2 weeks ago 05/02/2022, also 04/12/2022 FINDINGS: Cardiovascular: No filling defects in the pulmonary arteries to suggest pulmonary embolus. Dilated main right pulmonary artery at 3.8 cm, mild dilated left pulmonary artery at 2.7 cm. Aortic atherosclerosis and tortuosity. Cannot assess for dissection given phase of contrast tailored to pulmonary arteries ses mint multi chamber cardiomegaly. There are coronary artery calcifications no pericardial effusion. Mediastinum/Nodes: Shotty and mildly enlarged mediastinal and hilar lymph nodes, largest in the subcarinal station measuring 11 mm. Decompressed esophagus. Lungs/Pleura: Again seen heterogeneous areas of ground-glass opacity and septal thickening with a slight upper lobe predominant distribution. Similar small bilateral pleural effusions to prior exam. Again seen areas of subpleural reticulation and traction bronchiectasis. There is prominent central bronchial thickening areas of mucoid impaction in the lower lobes. Upper Abdomen: No acute findings. Small cyst in the upper right kidney, needs no further follow-up. Musculoskeletal: Exaggerated thoracic kyphosis. Stable bone island within T1 vertebral body. Subacute anterior rib fractures. No acute osseous findings. Review of the MIP images confirms the above findings. IMPRESSION: 1. No pulmonary embolus. 2. Persistent  heterogeneous areas of ground-glass opacity and septal thickening with a slight upper lobe predominant distribution. This is without significant interval change from exam 2 weeks ago. Differential considerations include pulmonary edema versus atypical infection or interstitial lung disease. 3. Small bilateral pleural effusions. 4. Prominent central bronchial thickening with areas of mucoid impaction in the lower lobes. 5. Cardiomegaly with coronary artery calcifications. Dilated main pulmonary artery suggesting pulmonary arterial hypertension. 6. Shotty and mildly enlarged mediastinal and hilar lymph nodes are likely reactive. Aortic Atherosclerosis (ICD10-I70.0). Electronically Signed: By: MKeith RakeM.D. On: 05/16/2022 23:14   DG Chest Port 1 View  Result Date: 05/16/2022 CLINICAL DATA:  Questionable sepsis. EXAM: PORTABLE CHEST 1 VIEW COMPARISON:  Chest radiograph dated 05/02/2022. FINDINGS: Diffuse chronic truck coarsening. No consolidative changes. Probable trace bilateral pleural effusions and bibasilar atelectasis. No pneumothorax. Stable cardiomegaly. Atherosclerotic calcification of the aorta. No acute osseous pathology. IMPRESSION: 1. Probable trace bilateral pleural effusions and bibasilar atelectasis. 2. Stable cardiomegaly. Electronically Signed   By: AAnner CreteM.D.   On: 05/16/2022 21:49    Microbiology: Recent Results (from the past 240 hour(s))  SARS Coronavirus 2 by RT  PCR (hospital order, performed in [facility] hospital lab) *cepheid single result test* Anterior Nasal Swab     Status: None   Collection Time: 06/02/22  8:48 PM   Specimen: Anterior Nasal Swab  Result Value Ref Range Status   SARS Coronavirus 2 by RT PCR NEGATIVE NEGATIVE Final    Comment: (NOTE) SARS-CoV-2 target nucleic acids are NOT DETECTED.  The SARS-CoV-2 RNA is generally detectable in upper and lower respiratory specimens during the acute phase of infection. The lowest concentration of  SARS-CoV-2 viral copies this assay can detect is 250 copies / mL. A negative result does not preclude SARS-CoV-2 infection and should not be used as the sole basis for treatment or other patient management decisions.  A negative result may occur with improper specimen collection / handling, submission of specimen other than nasopharyngeal swab, presence of viral mutation(s) within the areas targeted by this assay, and inadequate number of viral copies (<250 copies / mL). A negative result must be combined with clinical observations, patient history, and epidemiological information.  Fact Sheet for Patients:   https://www.patel.info/  Fact Sheet for Healthcare Providers: https://hall.com/  This test is not yet approved or  cleared by the [place] FDA and has been authorized for detection and/or diagnosis of SARS-CoV-2 by FDA under an Emergency Use Authorization (EUA).  This EUA will remain in effect (meaning this test can be used) for the duration of the COVID-19 declaration under Section 564(b)(1) of the Act, 21 U.S.C. section 360bbb-3(b)(1), unless the authorization is terminated or revoked sooner.  Performed at [facility], [place] [address], [place], [place] [address]   Blood culture (routine x 2)     Status: None   Collection Time: 06/02/22 11:22 PM   Specimen: BLOOD RIGHT FOREARM  Result Value Ref Range Status   Specimen Description BLOOD RIGHT FOREARM  Final   Special Requests   Final    BOTTLES DRAWN AEROBIC AND ANAEROBIC Blood Culture results may not be optimal due to an inadequate volume of blood received in culture bottles   Culture   Final    NO GROWTH 5 DAYS Performed at [facility], [place] [address], [place], [place] [address]    Report Status 06/07/2022 FINAL  Final  Blood culture (routine x 2)     Status: None   Collection Time: 06/03/22 12:07 AM   Specimen: BLOOD  Result Value Ref Range Status    Specimen Description BLOOD SITE NOT SPECIFIED  Final   Special Requests   Final    BOTTLES DRAWN AEROBIC AND ANAEROBIC Blood Culture adequate volume   Culture   Final    NO GROWTH 5 DAYS Performed at [facility], [address] [place], [place] [address]    Report Status 06/08/2022 FINAL  Final  MRSA Next Gen by PCR, Nasal     Status: None   Collection Time: 06/03/22 12:07 AM   Specimen: Nasal Mucosa; Nasal Swab  Result Value Ref Range Status   MRSA by PCR Next Gen NOT DETECTED NOT DETECTED Final    Comment: (NOTE) The GeneXpert MRSA Assay (FDA approved for NASAL specimens only), is one component of a comprehensive MRSA colonization surveillance program. It is not intended to diagnose MRSA infection nor to guide or monitor treatment for MRSA infections. Test performance is not FDA approved in patients less than 73 years old. Performed at [facility], [place] [address] [place], [place] [address]  Labs: Basic Metabolic Panel: Recent Labs  Lab 06/04/22 0451 06/05/22 0516 06/06/22 0303  NA 137 139 138  K 4.2 3.0* 3.9  CL 108 105 104  CO2 21* 26 24  GLUCOSE 134* 119* 104*  BUN 26* 34* 35*  CREATININE 0.90 1.13* 0.84  CALCIUM 8.2* 8.2* 8.5*   Liver Function Tests: No results for input(s): "AST", "ALT", "ALKPHOS", "BILITOT", "PROT", "ALBUMIN" in the last 168 hours.  No results for input(s): "LIPASE", "AMYLASE" in the last 168 hours.  No results for input(s): "AMMONIA" in the last 168 hours. CBC: Recent Labs  Lab 06/04/22 0451  WBC 14.6*  HGB 12.4  HCT 38.3  MCV 105.8*  PLT 171   Cardiac Enzymes: No results for input(s): "CKTOTAL", "CKMB", "CKMBINDEX", "TROPONINI" in the last 168 hours. BNP: BNP (last 3 results) Recent Labs    04/12/22 1641 05/02/22 1337 05/16/22 2051  BNP 133.1* 113.5* 148.8*    ProBNP (last 3 results) No results for input(s): "PROBNP" in the last 8760 hours.  CBG: No results for input(s): "GLUCAP" in the last 168  hours.     Signed:  Domenic Polite MD.  [facility] 06/10/2022, 1:04 PM

## 2022-06-10 NOTE — Plan of Care (Signed)
  Problem: Clinical Measurements: Goal: Ability to maintain clinical measurements within normal limits will improve Outcome: Progressing Goal: Will remain free from infection Outcome: Progressing Goal: Diagnostic test results will improve Outcome: Progressing   Problem: Nutrition: Goal: Adequate nutrition will be maintained Outcome: Progressing   

## 2022-06-11 DIAGNOSIS — B3789 Other sites of candidiasis: Secondary | ICD-10-CM | POA: Diagnosis not present

## 2022-06-11 DIAGNOSIS — J9601 Acute respiratory failure with hypoxia: Secondary | ICD-10-CM | POA: Diagnosis not present

## 2022-06-11 DIAGNOSIS — I10 Essential (primary) hypertension: Secondary | ICD-10-CM | POA: Diagnosis not present

## 2022-06-11 DIAGNOSIS — Z86711 Personal history of pulmonary embolism: Secondary | ICD-10-CM | POA: Diagnosis not present

## 2022-06-11 LAB — SARS CORONAVIRUS 2 BY RT PCR: SARS Coronavirus 2 by RT PCR: NEGATIVE

## 2022-06-11 NOTE — Progress Notes (Signed)
Patient with no chest pain or dyspnea, continue very deconditioned.   BP 120/83 (BP Location: Right Arm)   Pulse 91   Temp 97.6 F (36.4 C) (Oral)   Resp 16   Ht '5\' 2"'$  (1.575 m)   Wt 53.6 kg   LMP 11/03/1983   SpO2 95%   BMI 21.61 kg/m   Awake and alert ENT with mild pallor Cardiovascular with S1 and S2 present and rhythmic with no gallops Respiratory with no rales or wheezing Abdomen with no distention  No lower extremity edema  Plan possible transfer to residential hospice in am.

## 2022-06-11 NOTE — TOC Progression Note (Addendum)
Transition of Care [facility]) - Progression Note    Patient Details  Name: Natalie West MRN: [id number] Date of Birth: Oct 07, 1931  Transition of Care [facility]) CM/SW [place], LCSW Phone Number: 06/11/2022, 9:48 AM  Clinical Narrative:    9:48am-CSW spoke with Threasa Beards with [facility]. She stated they received the updated notes and she is waiting on Marylyn Ishihara to review them. She will also let him know that patient is on 3L of oxygen.    12:15pm-CSW received call from [facility] with [facility]. She stated they have reached out to patient's POA (CSW faxing POA paperwork to them) and are having him sign consents. She requested COVID test for patient; ordered. She emailed CSW form for MD to sign and return. They do not have a bed available today but possibly for tomorrow. She stated POA wanted to keep her in the hospital until Monday as he is out of town still but CSW spoke with [name] and made him aware that we will have to move her once a bed is available.   Expected Discharge Plan: [place] Barriers to Discharge: Continued Medical Work up  Expected Discharge Plan and Services Expected Discharge Plan: [place]                                               Social Determinants of Health (SDOH) Interventions    Readmission Risk Interventions    05/19/2022    5:16 PM 04/16/2022   12:20 PM  Readmission Risk Prevention Plan  Transportation Screening Complete Complete  PCP or Specialist Appt within 5-7 Days  Complete  PCP or Specialist Appt within 3-5 Days Complete   Home Care Screening  Complete  Medication Review (RN CM)  Complete  HRI or Home Care Consult Complete   Social Work Consult for [place] Planning/Counseling Complete   Palliative Care Screening Not Applicable   Medication Review Press photographer) Referral to Pharmacy

## 2022-06-12 DIAGNOSIS — I10 Essential (primary) hypertension: Secondary | ICD-10-CM | POA: Diagnosis not present

## 2022-06-12 DIAGNOSIS — J9601 Acute respiratory failure with hypoxia: Secondary | ICD-10-CM | POA: Diagnosis not present

## 2022-06-12 DIAGNOSIS — Z86711 Personal history of pulmonary embolism: Secondary | ICD-10-CM | POA: Diagnosis not present

## 2022-06-12 MED ORDER — POTASSIUM CHLORIDE 20 MEQ PO PACK
20.0000 meq | PACK | Freq: Every day | ORAL | Status: DC
  Administered 2022-06-13: 20 meq via ORAL
  Filled 2022-06-12: qty 1

## 2022-06-12 NOTE — TOC Progression Note (Addendum)
Transition of Care [facility]) - Progression Note    Patient Details  Name: LEZLEE GILLS MRN: [id number] Date of Birth: 1930-11-19  Transition of Care [facility]) CM/SW [place], LCSW Phone Number: 06/12/2022, 9:39 AM  Clinical Narrative:    CSW spoke with Benjamine Mola at [facility]. They have bed available for patient. However, CSW spoke with several transport companies and none are able to transport patient today ([facility] was able to do 8pm but Benjamine Mola stated facility cannot accept patient that late). CSW scheduled 11am pickup for tomorrow with [facility]. Paperwork on hard chart ready. Faxed COVID test results. Will just need to fax DC Summary tomorrow.  RN report # is [phone] Fax # [phone]  Updated patient's POA, [name].    Expected Discharge Plan: [place] Barriers to Discharge: Continued Medical Work up  Expected Discharge Plan and Services Expected Discharge Plan: [place]                                               Social Determinants of Health (SDOH) Interventions    Readmission Risk Interventions    05/19/2022    5:16 PM 04/16/2022   12:20 PM  Readmission Risk Prevention Plan  Transportation Screening Complete Complete  PCP or Specialist Appt within 5-7 Days  Complete  PCP or Specialist Appt within 3-5 Days Complete   Home Care Screening  Complete  Medication Review (RN CM)  Complete  HRI or Home Care Consult Complete   Social Work Consult for [place] Planning/Counseling Complete   Palliative Care Screening Not Applicable   Medication Review Press photographer) Referral to Pharmacy

## 2022-06-12 NOTE — Progress Notes (Signed)
Patient with no chest pain or dyspnea, tolerating po well.  Not able to transport to residential hospice today  BP 98/70 (BP Location: Right Arm)   Pulse 86   Temp 97.9 F (36.6 C) (Oral)   Resp 16   Ht '5\' 2"'$  (1.575 m)   Wt 53.6 kg   LMP 11/03/1983   SpO2 95%   BMI 21.61 kg/m   Awake and alert, mild confusion  ENT with mild pallor Cardiovascular with S1 and S2 present and rhythmic Respiratory with scattered rales Abdomen not distended No lower extremity edema. \  Acute on chronic diastolic heart failure.  Improved volume status, patient with very poor prognosis. Frequent hospitalizations.  Plan to transition to residential hospice in am.

## 2022-06-13 DIAGNOSIS — I10 Essential (primary) hypertension: Secondary | ICD-10-CM | POA: Diagnosis not present

## 2022-06-13 DIAGNOSIS — J9601 Acute respiratory failure with hypoxia: Secondary | ICD-10-CM | POA: Diagnosis not present

## 2022-06-13 DIAGNOSIS — I482 Chronic atrial fibrillation, unspecified: Secondary | ICD-10-CM | POA: Diagnosis not present

## 2022-06-13 NOTE — Progress Notes (Signed)
Patient continue to be stable with no dyspnea or chest pain  BP 108/70 (BP Location: Right Arm)   Pulse 80   Temp 98 F (36.7 C) (Oral)   Resp 16   Ht '5\' 2"'$  (1.575 m)   Wt 53.6 kg   LMP 11/03/1983   SpO2 97%   BMI 21.61 kg/m   Neurology awake and alert ENT with no pallor Cardiovascular with S1 and S2 present and rhythmic Respiratory with no rales or wheezing Abdomen with no distention  No lower extremity edema  Heart failure. Currently compensated.  Plan to transfer to residential hospice today.

## 2022-06-13 NOTE — TOC Transition Note (Signed)
Transition of Care [facility]) - CM/SW Discharge Note   Patient Details  Name: Natalie West MRN: [id number] Date of Birth: January 05, 1931  Transition of Care [facility]) CM/SW Contact:  Benard Halsted, LCSW Phone Number: 06/13/2022, 8:46 AM   Clinical Narrative:    Patient will DC to: [facility]) Anticipated DC date: 06/13/22 Family notified: Franchot Erichsen Transport by: Consuello Masse   Per MD patient ready for DC to [facility]. RN to call report prior to discharge [phone]). RN, patient, patient's family, and facility notified of DC. Discharge Summary faxed to facility. DC packet on chart including signed DNR. Ambulance transport requested for patient.   CSW will sign off for now as social work intervention is no longer needed. Please consult [place] again if new needs arise.     Final next level of care: [place] Barriers to Discharge: Barriers Resolved   Patient Goals and CMS Choice Patient states their goals for this hospitalization and ongoing recovery are:: [place] CMS Medicare.gov Compare Post Acute Care list provided to:: Patient Represenative (must comment) Choice offered to / list presented to :  (Nephew/POA)  Discharge Placement              Patient chooses bed at:  [facility]) Patient to be transferred to facility by: [facility] Name of family member notified: [name] Patient and family notified of of transfer: 06/13/22  Discharge Plan and Services                                     Social Determinants of Health (SDOH) Interventions     Readmission Risk Interventions    05/19/2022    5:16 PM 04/16/2022   12:20 PM  Readmission Risk Prevention Plan  Transportation Screening Complete Complete  PCP or Specialist Appt within 5-7 Days  Complete  PCP or Specialist Appt within 3-5 Days Complete   Home Care Screening  Complete  Medication Review (RN CM)  Complete  HRI or Home Care Consult Complete   Social Work Consult for [place]  Planning/Counseling Complete   Palliative Care Screening Not Applicable   Medication Review Press photographer) Referral to Pharmacy

## 2022-06-13 NOTE — Progress Notes (Addendum)
Pt ordered to discharge to hospice. AVS reviewed with  [facility] transport. Education provided as needed.

## 2022-07-27 IMAGING — CR DG SHOULDER 2+V*L*
2 series · 2 of 2 positions shown · non-contrast
Comparison: None.

CLINICAL DATA: Pain following fall

EXAM:
LEFT SHOULDER - 2+ VIEW

[w shoulder external left]
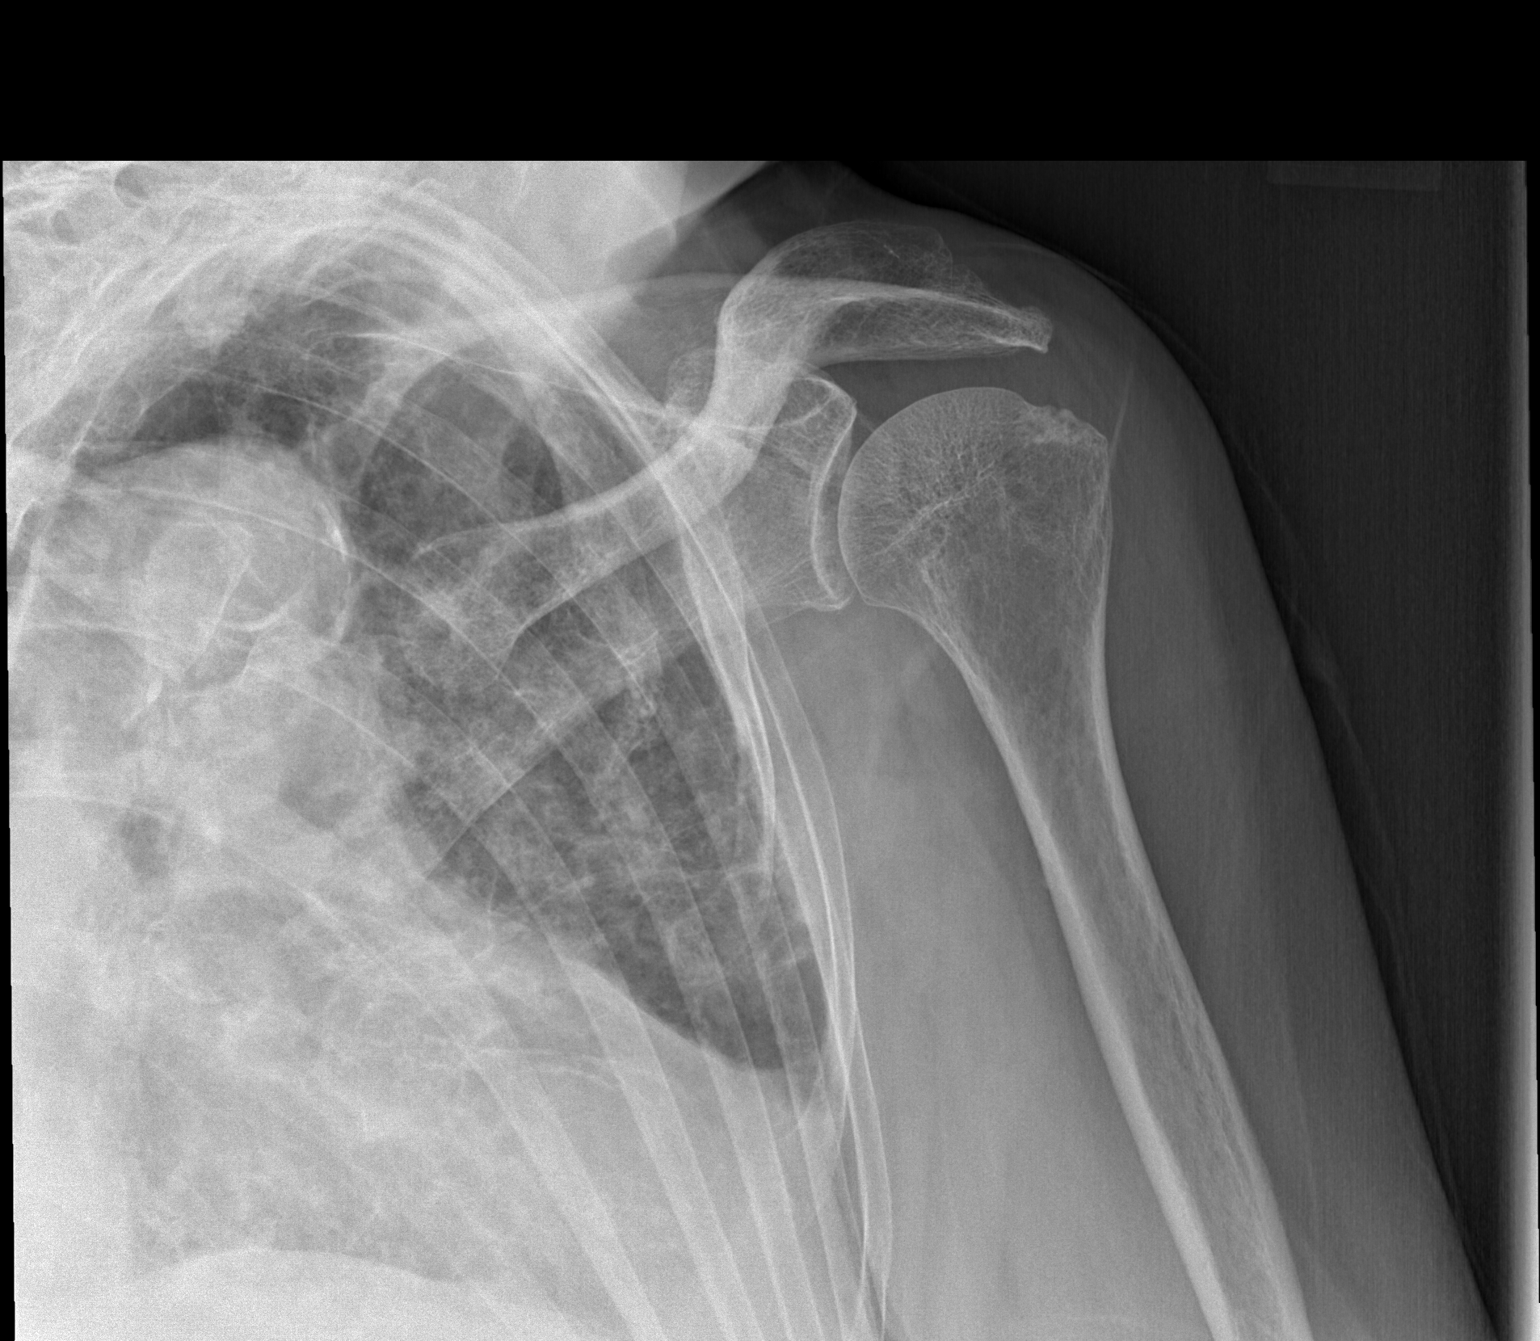

[w shoulder y-view left]
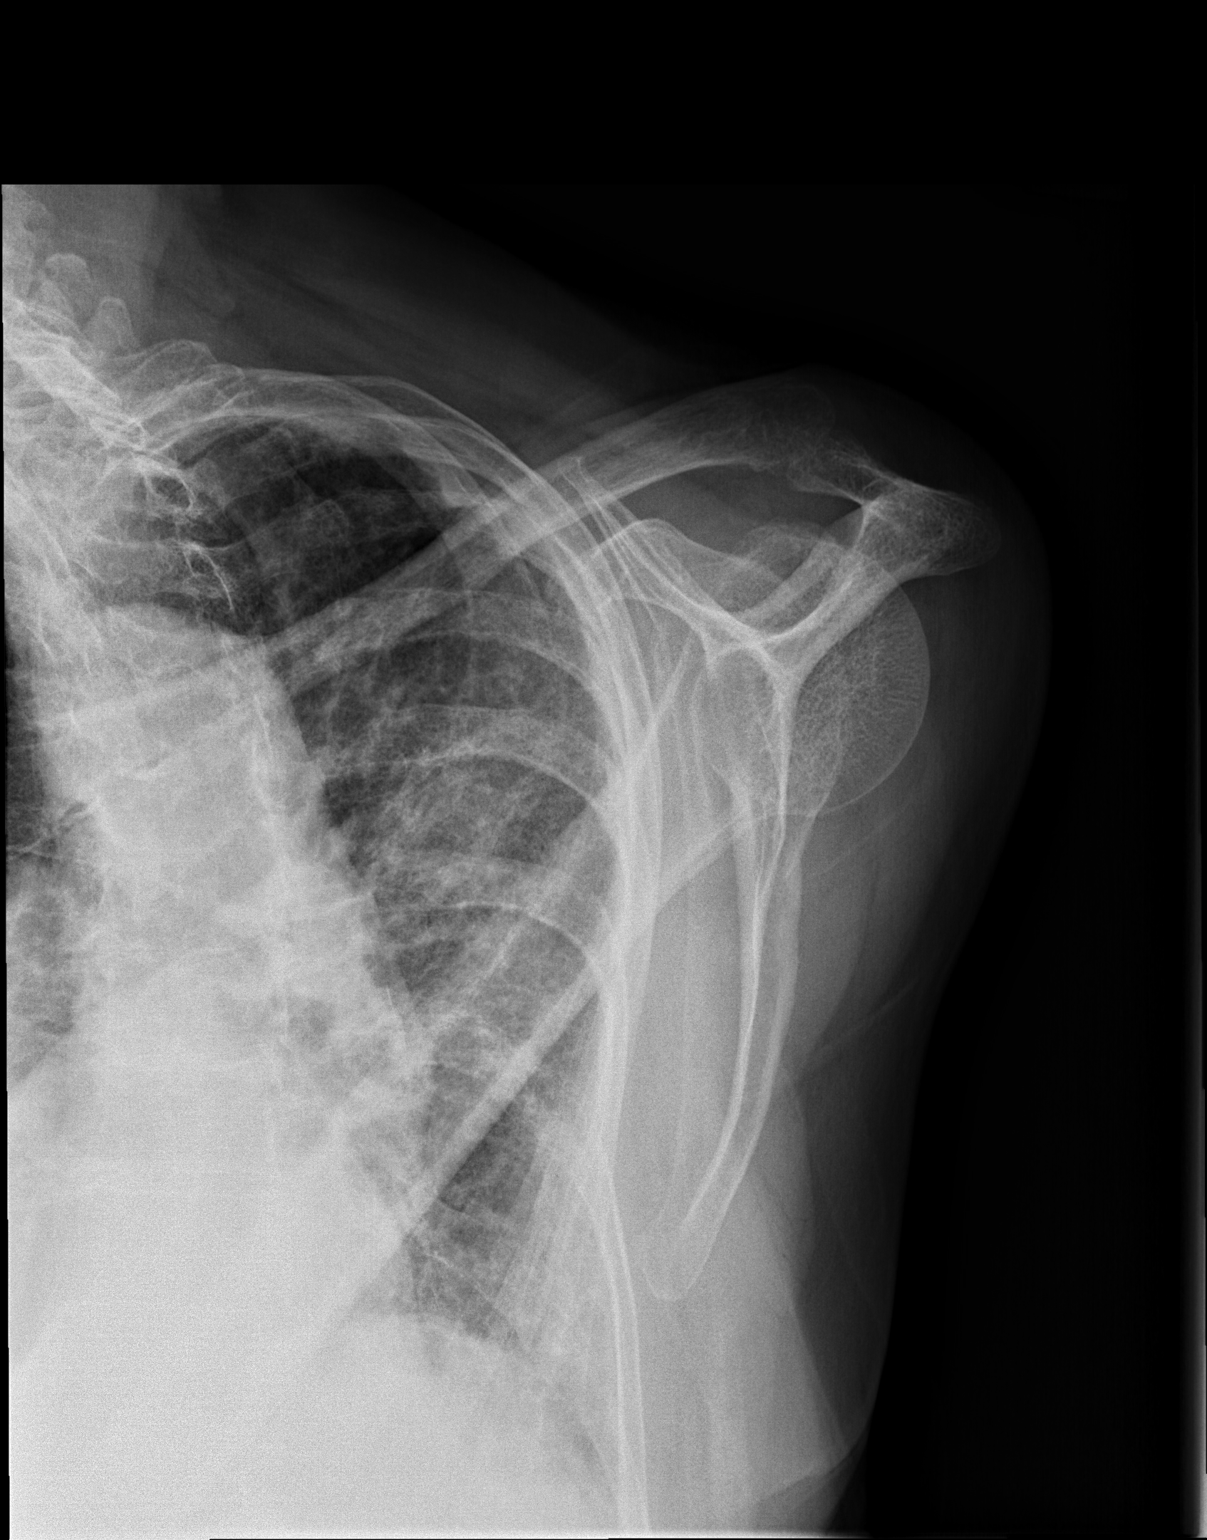

[2 of 2 positions shown; findings below may reference images not displayed]

FINDINGS: Oblique and Y scapular images were obtained. There is no appreciable
fracture or dislocation. There is moderate generalized joint space
narrowing. No erosive change. Chronic interstitial thickening noted
left lung.
IMPRESSION: No fracture or dislocation. Moderate generalized osteoarthritic
change. Question underlying fibrotic change in the left lung.

## 2022-09-02 DEATH — deceased

## 2022-10-07 ENCOUNTER — Other Ambulatory Visit (HOSPITAL_BASED_OUTPATIENT_CLINIC_OR_DEPARTMENT_OTHER): Payer: Self-pay

## 2023-06-19 IMAGING — US US EXTREM LOW VENOUS*R*
1 series · 13 of 24 positions shown · non-contrast
Comparison: None.

CLINICAL DATA: RIGHT lower extremity swelling.  RIGHT thigh pain

EXAM:
RIGHT LOWER EXTREMITY VENOUS DOPPLER ULTRASOUND
TECHNIQUE: Gray-scale sonography with compression, as well as color and duplex
ultrasound, were performed to evaluate the deep venous system(s)
from the level of the common femoral vein through the popliteal and
proximal calf veins.

[Series 1: us venous img lower uni right (dvt) · portal-venous · 40 acquisitions, 13 frames shown]
[im 1/40]
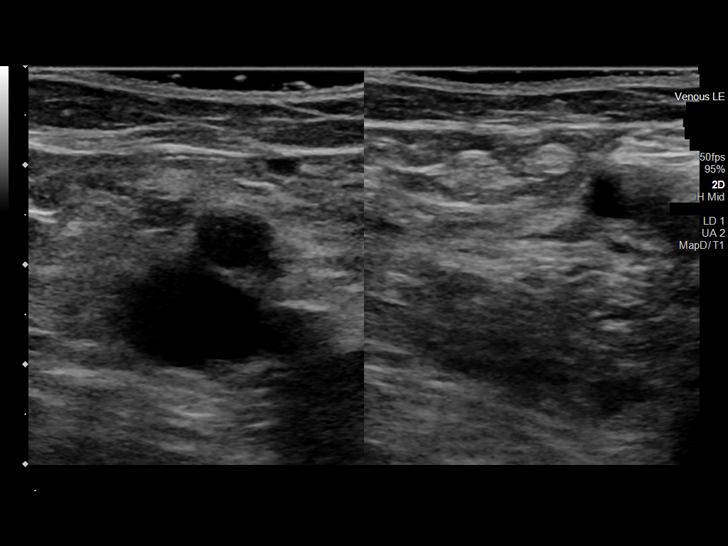
[im 4/40]
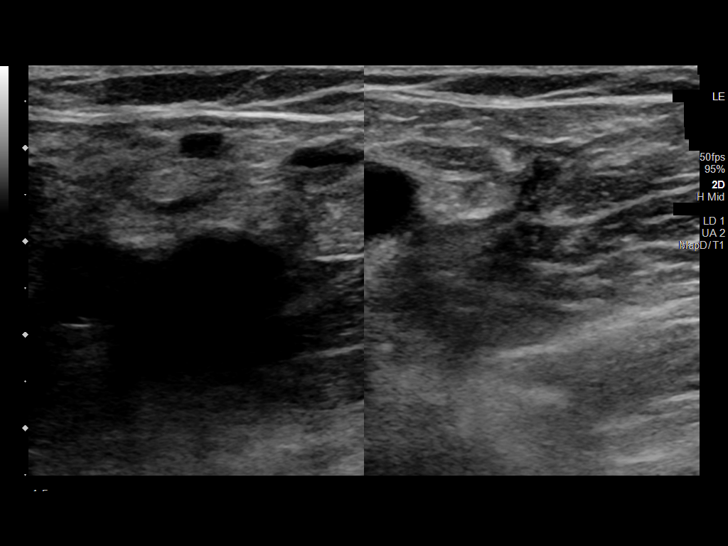
[im 7/40]
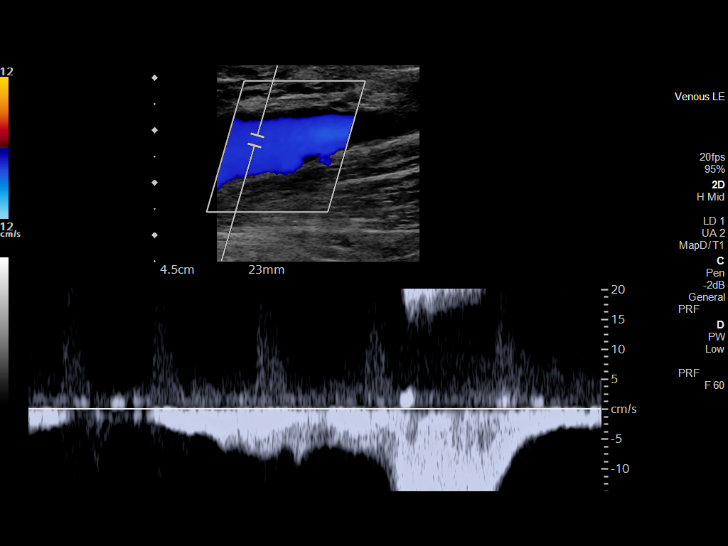
[im 11/40]
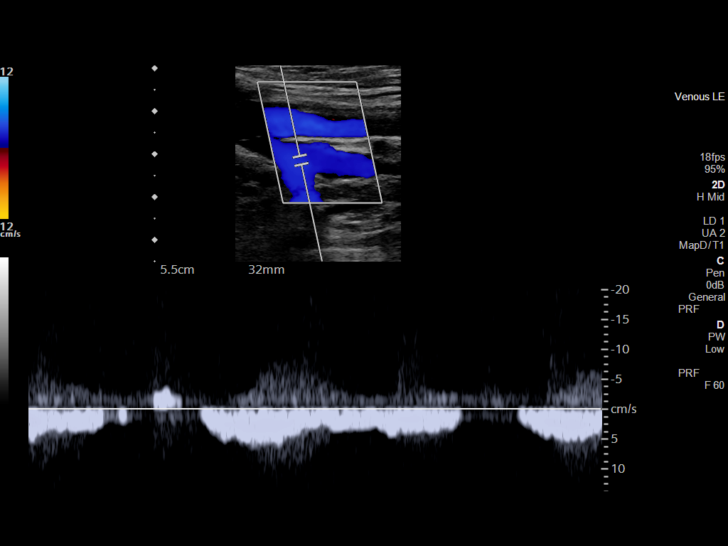
[im 14/40]
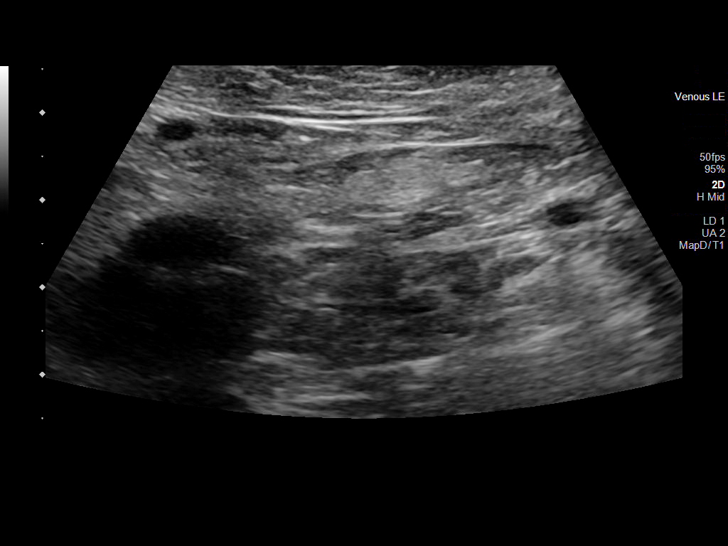
[im 16/40]
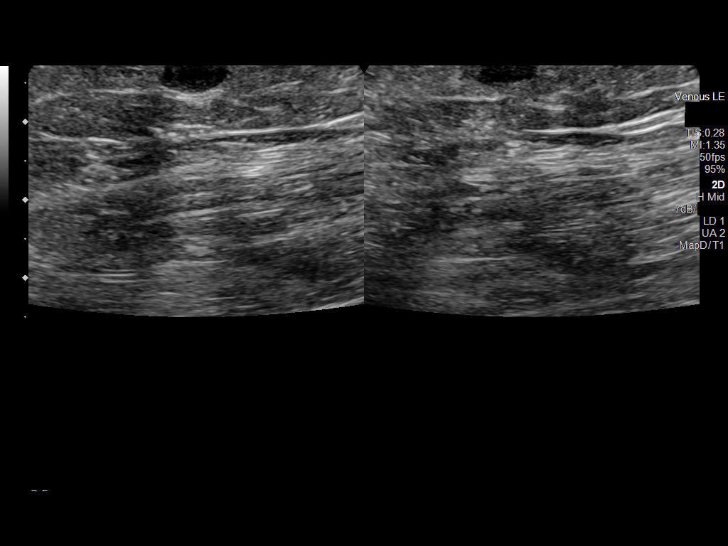
[im 21/40]
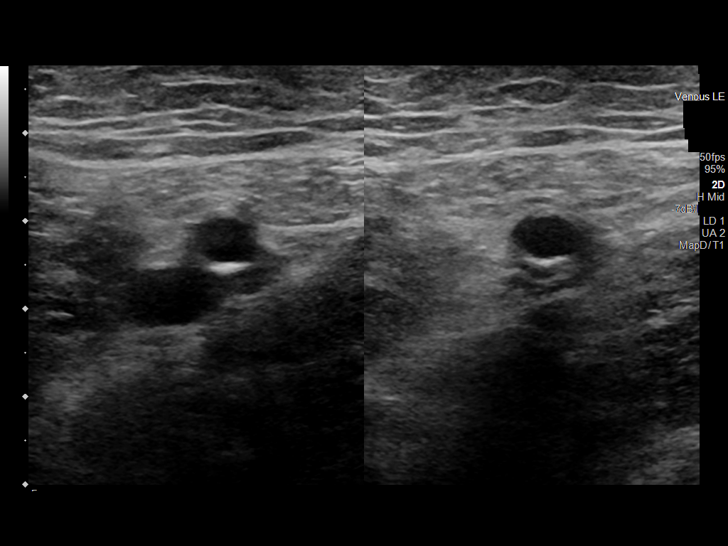
[im 23/40]
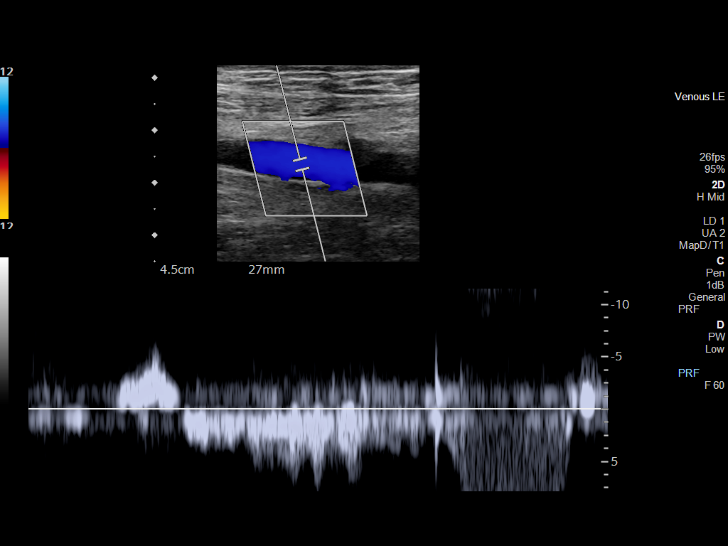
[im 26/40]
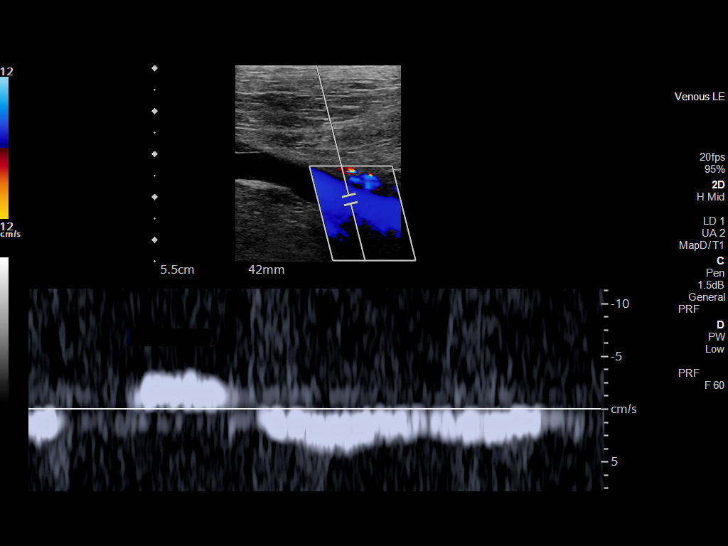
[im 29/40]
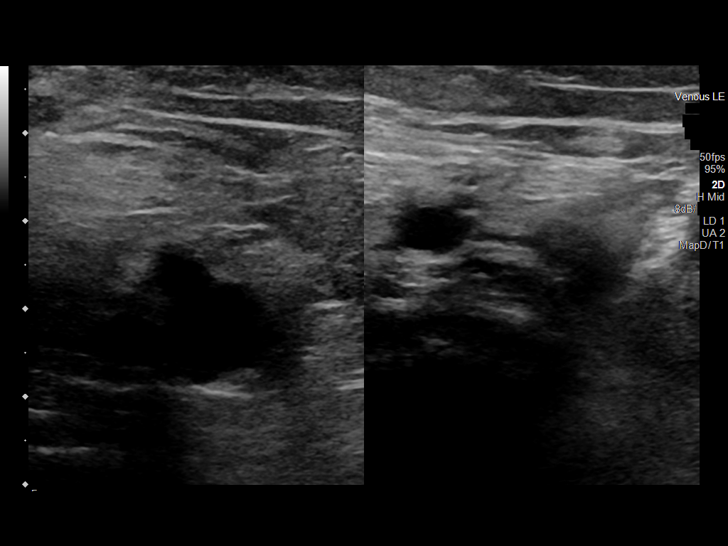
[im 33/40]
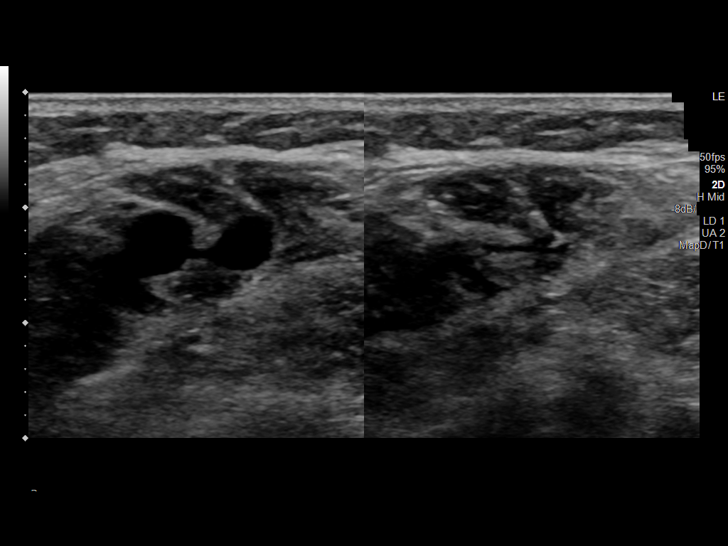
[im 36/40]
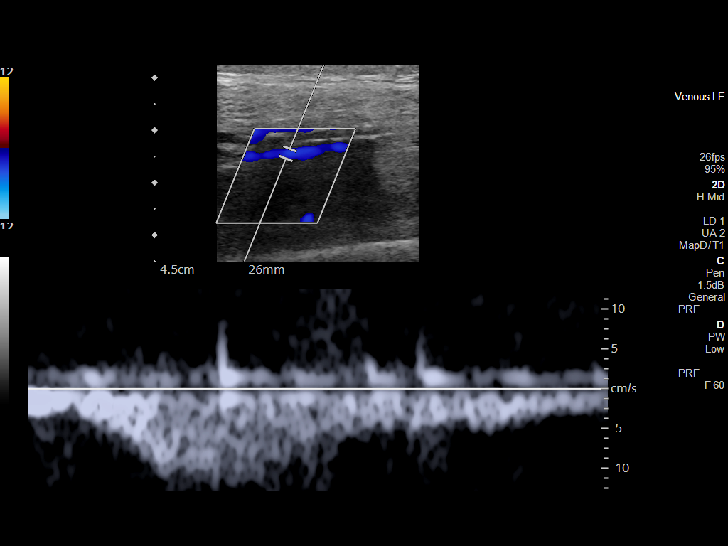
[im 40/40]
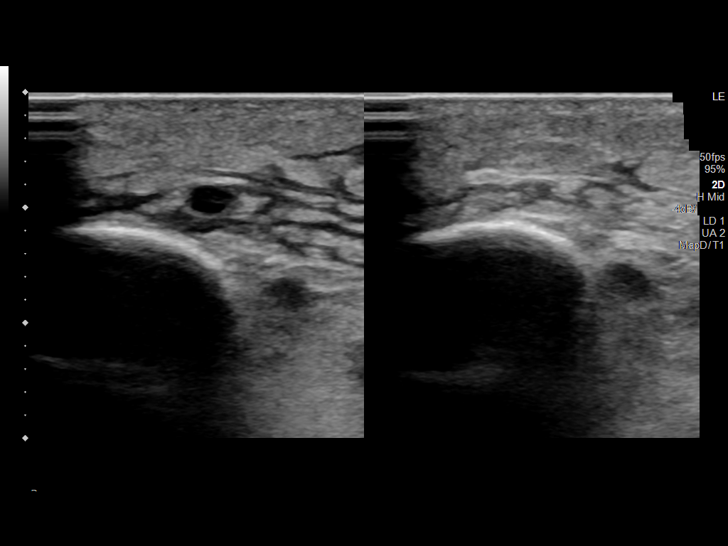

[13 of 24 positions shown; findings below may reference images not displayed]

FINDINGS: VENOUS

Normal compressibility of the RIGHT common femoral, superficial
femoral, and popliteal veins, as well as the visualized calf veins.
Visualized portions of profunda femoral vein and great saphenous
vein unremarkable. No filling defects to suggest DVT on grayscale or
color Doppler imaging. Doppler waveforms show normal direction of
venous flow, normal respiratory plasticity and response to
augmentation.

Limited views of the contralateral common femoral vein are
unremarkable.

OTHER

RIGHT thigh superficial venous varix without evidence of venous
flow, consistent with superficial thrombophlebitis.

Limitations: none
IMPRESSION: 1. No evidence of femoropopliteal DVT within the RIGHT lower
extremity.
2. RIGHT thigh venous varix with superficial thrombophlebitis. This
likely accounts for the patient's reported thigh pain.
Consider referral to [facility] for
evaluation and management of potential venous insufficiency.

## 2023-07-09 IMAGING — CT CT KNEE*R* W/O CM
3 of 4 series · 14 of 33 positions shown, 17 images · non-contrast
Comparison: Only relevant comparison is right knee plain films
earlier today.

CLINICAL DATA: Fall with right knee trauma.

EXAM:
CT OF THE RIGHT KNEE WITHOUT CONTRAST
TECHNIQUE: Multidetector CT imaging of the right knee was performed according
to the standard protocol. Multiplanar CT image reconstructions were
also generated.
RADIATION DOSE REDUCTION: This exam was performed according to the
departmental dose-optimization program which includes automated
exposure control, adjustment of the mA and/or kV according to
patient size and/or use of iterative reconstruction technique.

[Series 4: axial st · axial · 0.32mm/px · z∈[+764,+914]mm · 8 of 89 slices shown, 10 images]
[im 7/89  soft-tissue]
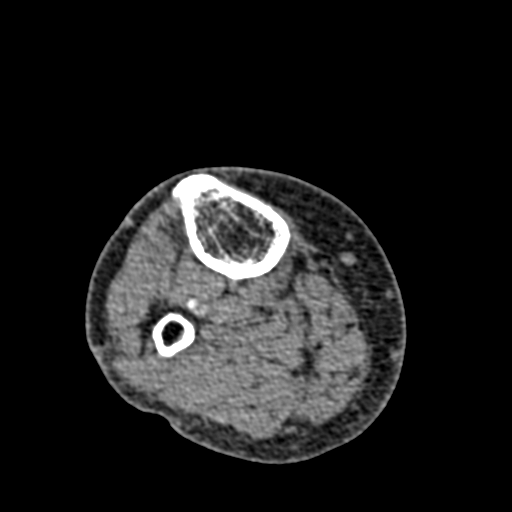
[im 7/89  bone]
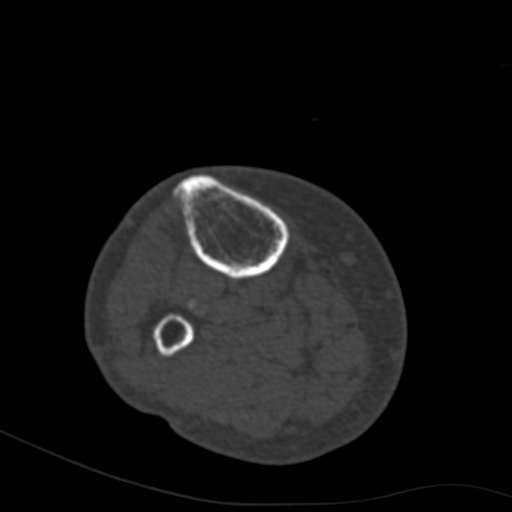
[im 21/89  bone]
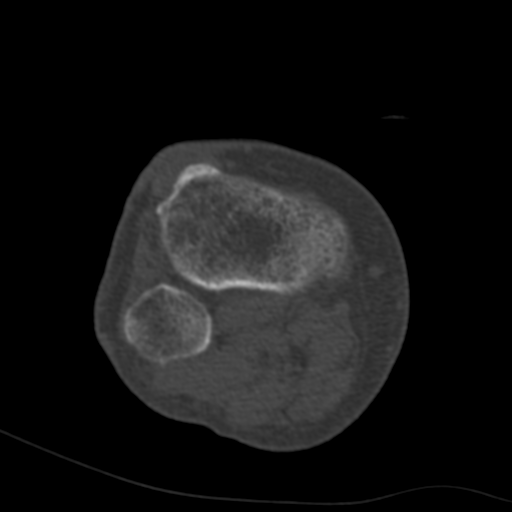
[im 28/89  bone]
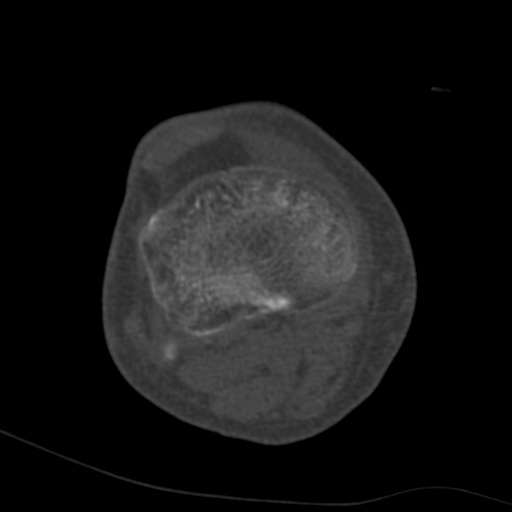
[im 41/89  bone]
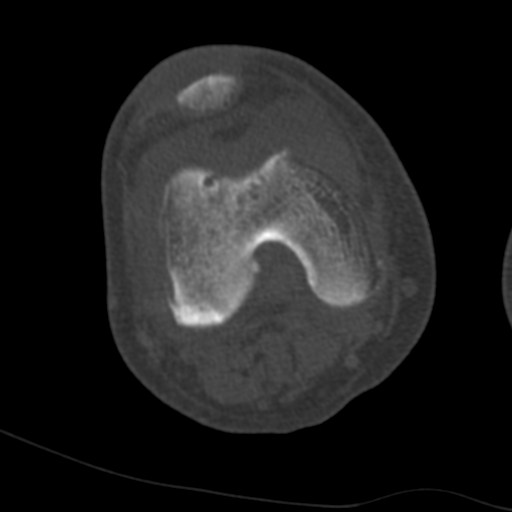
[im 48/89  soft-tissue]
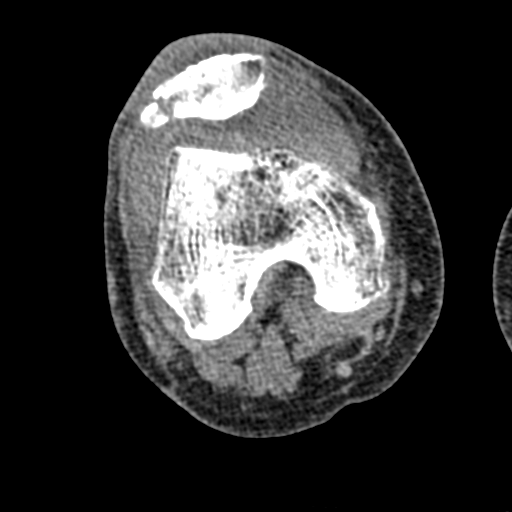
[im 48/89  bone]
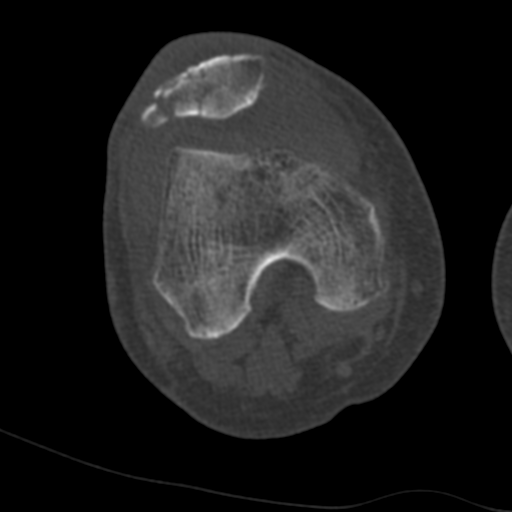
[im 61/89  bone]
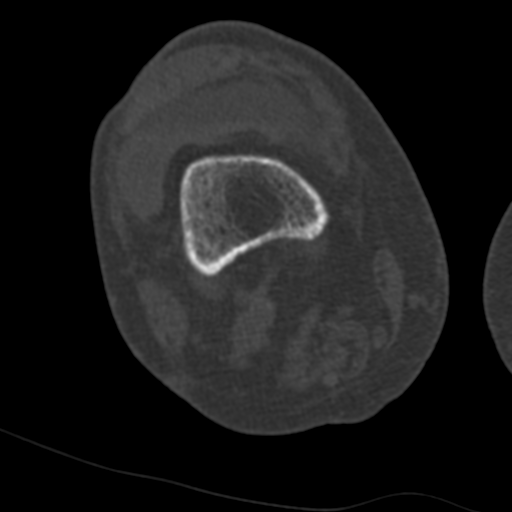
[im 68/89  bone]
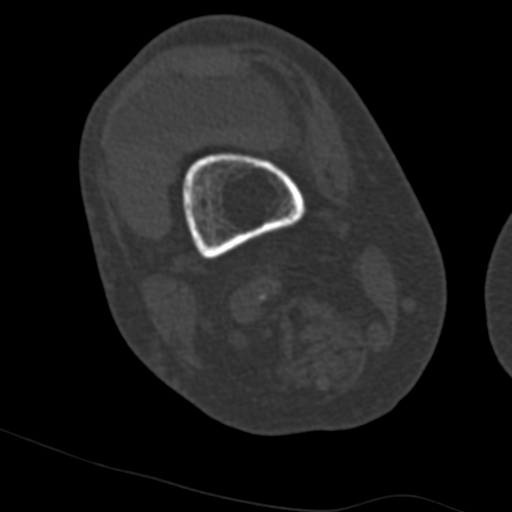
[im 82/89  bone]
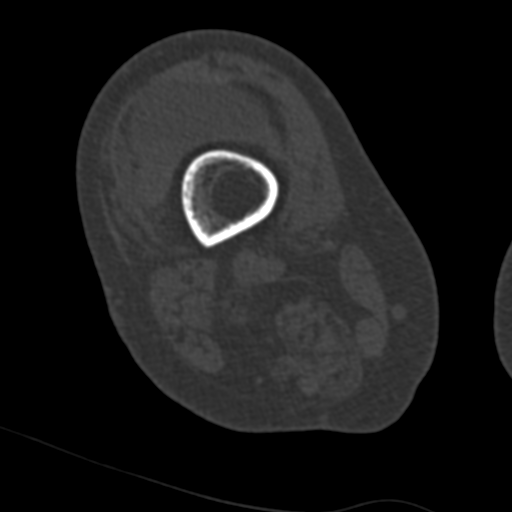

[Series 5: coronal bone · coronal · 0.29mm/px · 1 of 71 slices shown]
[im 36/71  bone]
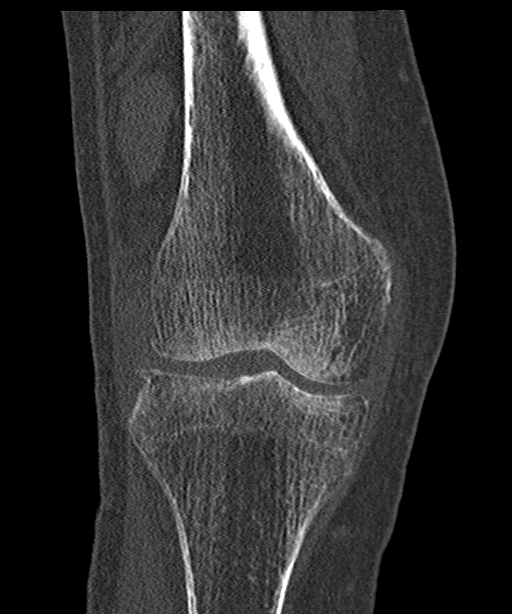

[Series 8: sagittal st · sagittal · 0.32mm/px · 5 of 60 slices shown, 6 images]
[im 20/60  bone]
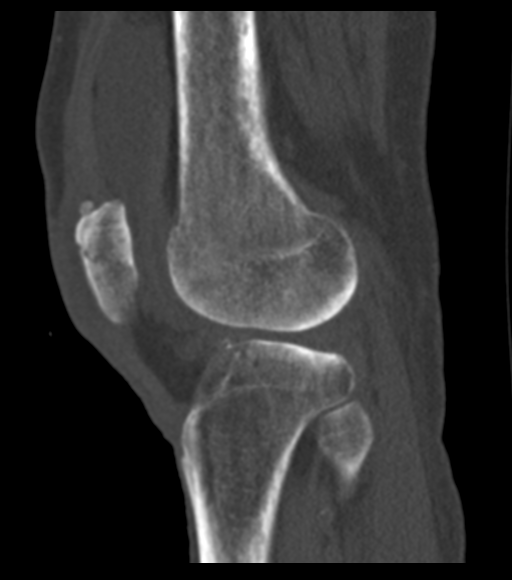
[im 25/60  bone]
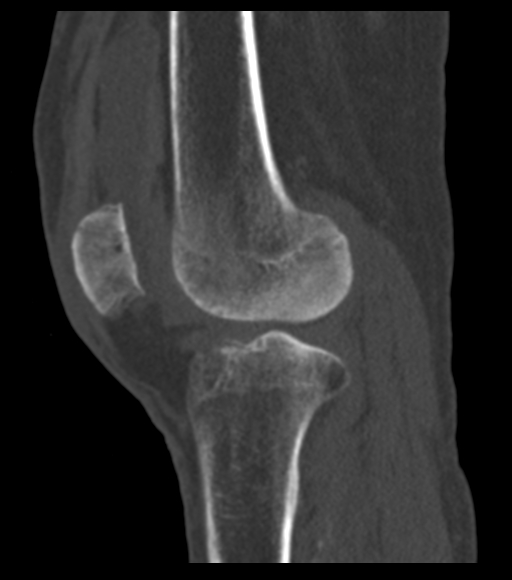
[im 30/60  soft-tissue]
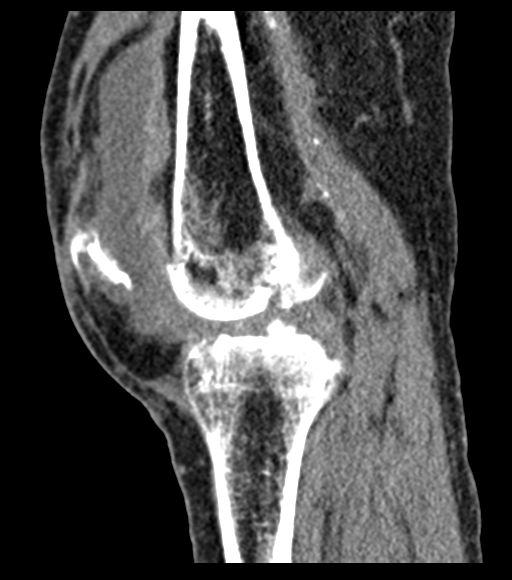
[im 30/60  bone]
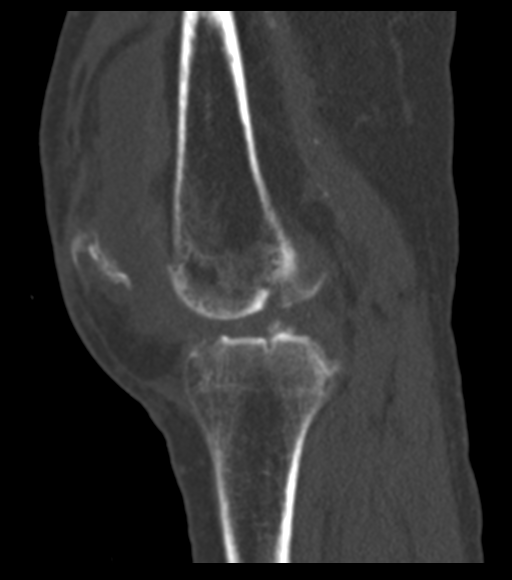
[im 35/60  bone]
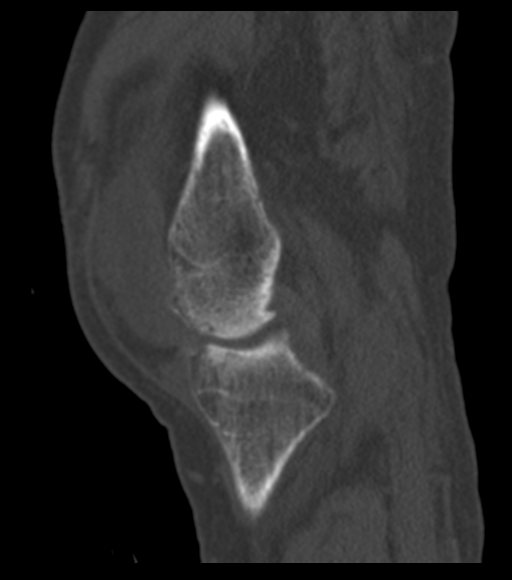
[im 40/60  bone]
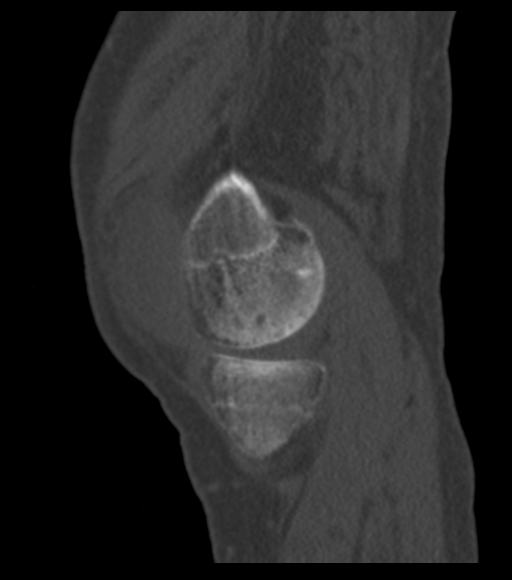

[14 of 33 positions shown; findings below may reference images not displayed]

FINDINGS: Bones/Joint/Cartilage

Osteopenia. There is multiply comminuted patellar fracture. Majority
of the fragments are nondisplaced with a few mm lateral distraction
of a linear longitudinal fragment of the lateral aspect of the
patella.

There is no further evidence of fractures. There is mild
femorotibial joint space loss and marginal osteophytosis with
degenerative subcortical sclerotic and cystic changes. There are
small osteophytes of the posterior patella. The proximal fibula is
intact. There are small enthesophytes of the anterior superior
patella.

There is a large lipohemarthrosis primarily in the suprapatellar
bursa but also tracks into and widens the space between the distal
femur and the patella.

Ligaments

Suboptimally assessed by CT.

Muscles and Tendons

No intramuscular hematoma is seen. The patellar and quadriceps
tendons are grossly intact. Other tendon anatomy is not well
evaluated.

Soft tissues

There's generalized superficial edema anteriorly. There is scattered
calcification in the distal femoral and popliteal artery.
Calcifications extend into the proximal popliteal trifurcation
arteries.
IMPRESSION: 1. Multiply comminuted, largely nondisplaced patellar fracture
except for a longitudinal linear fragment of the lateral aspect
which is distracted a few mm from the parent bone.
2. Large lipohemarthrosis.
3. Osteopenia and degenerative change.
4. Vascular calcifications.
